# Patient Record
Sex: Female | Born: 1960
Health system: Southern US, Community
[De-identification: ages and names within clinical notes are randomized; demographics above are authoritative.]

## PROBLEM LIST (undated history)

## (undated) DIAGNOSIS — M1990 Unspecified osteoarthritis, unspecified site: Secondary | ICD-10-CM

## (undated) DIAGNOSIS — R011 Cardiac murmur, unspecified: Secondary | ICD-10-CM

## (undated) DIAGNOSIS — I509 Heart failure, unspecified: Secondary | ICD-10-CM

## (undated) DIAGNOSIS — K219 Gastro-esophageal reflux disease without esophagitis: Secondary | ICD-10-CM

## (undated) DIAGNOSIS — I2510 Atherosclerotic heart disease of native coronary artery without angina pectoris: Secondary | ICD-10-CM

## (undated) DIAGNOSIS — I350 Nonrheumatic aortic (valve) stenosis: Secondary | ICD-10-CM

## (undated) DIAGNOSIS — T7840XA Allergy, unspecified, initial encounter: Secondary | ICD-10-CM

## (undated) DIAGNOSIS — J45909 Unspecified asthma, uncomplicated: Secondary | ICD-10-CM

## (undated) DIAGNOSIS — E639 Nutritional deficiency, unspecified: Secondary | ICD-10-CM

## (undated) DIAGNOSIS — E119 Type 2 diabetes mellitus without complications: Secondary | ICD-10-CM

## (undated) DIAGNOSIS — I639 Cerebral infarction, unspecified: Secondary | ICD-10-CM

## (undated) DIAGNOSIS — E785 Hyperlipidemia, unspecified: Secondary | ICD-10-CM

## (undated) DIAGNOSIS — G4730 Sleep apnea, unspecified: Secondary | ICD-10-CM

## (undated) DIAGNOSIS — I739 Peripheral vascular disease, unspecified: Secondary | ICD-10-CM

## (undated) DIAGNOSIS — I10 Essential (primary) hypertension: Secondary | ICD-10-CM

## (undated) DIAGNOSIS — I43 Cardiomyopathy in diseases classified elsewhere: Secondary | ICD-10-CM

## (undated) DIAGNOSIS — I351 Nonrheumatic aortic (valve) insufficiency: Secondary | ICD-10-CM

## (undated) DIAGNOSIS — E889 Metabolic disorder, unspecified: Secondary | ICD-10-CM

## (undated) HISTORY — PX: TUBAL LIGATION: SHX77

## (undated) HISTORY — DX: Cardiomyopathy in diseases classified elsewhere: E63.9

## (undated) HISTORY — PX: CARDIAC CATHETERIZATION: SHX172

## (undated) HISTORY — DX: Nonrheumatic aortic (valve) insufficiency: I35.1

## (undated) HISTORY — DX: Unspecified asthma, uncomplicated: J45.909

## (undated) HISTORY — DX: Type 2 diabetes mellitus without complications: E11.9

## (undated) HISTORY — DX: Cardiomyopathy in diseases classified elsewhere: I43

## (undated) HISTORY — DX: Gastro-esophageal reflux disease without esophagitis: K21.9

## (undated) HISTORY — DX: Peripheral vascular disease, unspecified: I73.9

## (undated) HISTORY — PX: MECHANICAL AORTIC VALVE REPLACEMENT: SHX2013

## (undated) HISTORY — DX: Cardiac murmur, unspecified: R01.1

## (undated) HISTORY — DX: Sleep apnea, unspecified: G47.30

## (undated) HISTORY — PX: PERIPHERAL ARTERIAL STENT GRAFT: SHX2220

## (undated) HISTORY — DX: Cerebral infarction, unspecified: I63.9

## (undated) HISTORY — DX: Essential (primary) hypertension: I10

## (undated) HISTORY — DX: Heart failure, unspecified: I50.9

## (undated) HISTORY — DX: Unspecified osteoarthritis, unspecified site: M19.90

## (undated) HISTORY — DX: Hyperlipidemia, unspecified: E78.5

## (undated) HISTORY — DX: Atherosclerotic heart disease of native coronary artery without angina pectoris: I25.10

## (undated) HISTORY — DX: Nonrheumatic aortic (valve) stenosis: I35.0

## (undated) HISTORY — DX: Allergy, unspecified, initial encounter: T78.40XA

## (undated) HISTORY — DX: Nutritional deficiency, unspecified: E88.9

## (undated) HISTORY — PX: INSERT / REPLACE / REMOVE PACEMAKER: SUR710

---

## 2007-07-03 ENCOUNTER — Inpatient Hospital Stay (HOSPITAL_COMMUNITY): Admission: EM | Admit: 2007-07-03 | Discharge: 2007-07-05 | Payer: Self-pay | Admitting: Emergency Medicine

## 2007-07-03 ENCOUNTER — Ambulatory Visit: Payer: Self-pay | Admitting: Cardiology

## 2007-07-04 ENCOUNTER — Encounter (INDEPENDENT_AMBULATORY_CARE_PROVIDER_SITE_OTHER): Payer: Self-pay | Admitting: Pediatrics

## 2011-03-06 NOTE — H&P (Signed)
Shannon Baxter, Shannon Baxter                 ACCOUNT NO.:  [id number]   MEDICAL RECORD NO.:  [id number]          PATIENT TYPE:  INP   LOCATION:  1824                         FACILITY:  [facility]   PHYSICIAN:  Deanna Artis. Hickling, M.D.DATE OF BIRTH:  1961/06/10   DATE OF ADMISSION:  07/03/2007  DATE OF DISCHARGE:                              HISTORY & PHYSICAL   CHIEF COMPLAINT:  Right-sided weakness and aphasia.   HISTORY OF PRESENT CONDITION:  The patient is a 50 year old African  American woman, obese with hypertension, dyslipidemia and sedentary  lifestyle who smokes rock.  She did so today.  She was with family and  had sudden onset of right-sided weakness and aphasia around 2010.  This  persists until she arrived at the [facility] Emergency Room.  This  rapidly improved between 2130 and 2200 to normal baseline.   The patient had rheumatic fever as a child and has some form of cardiac  valvular disease.  She also has a history of seizures as a child.   PAST SURGICAL HISTORY:  Tubal ligation 20 years ago.  She is a gravida  2, para 2 woman.   CURRENT MEDICATIONS:  1. Crestor 25 mg daily.  2. Gabapentin 100 mg three times daily.  3. Meloxicam 15 mg daily.  4. Avelox 400 mg twice daily.  5. Naproxen 500 mg daily.  6. Guaifenesin 400 mg every 6 hours.   ALLERGIES:  METRONIDAZOLE.   FAMILY HISTORY:  Negative for stroke.  Positive for diabetes mellitus,  hypertension, atherosclerotic cardiovascular disease, obesity, and  dyslipidemia.   SOCIAL HISTORY:  The patient has one living child, the other was  involved in a motor vehicle accident.  She is not married.  She has a  granddaughter who is living with her.  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  She  smokes one or two cigarettes per day.  She drinks occasional beer.  She  smokes rock.   REVIEW OF SYSTEMS:  Positive for bronchitis, arthritis, pain in her  legs, allergies to blue dye, depression, recent urinary tract infection,  status post tubal  ligation without oophorectomy, irregular heart rate. A  12-system review is otherwise negative.   PHYSICAL EXAMINATION:  VITAL SIGNS:  Temperature 97.4, blood pressure  193/93, resting pulse 88, respirations 22, oxygen saturation 99% on 2  liters of oxygen.  HEENT:  No signs of infection.  NECK:  Supple.  No cranial or cervical bruits.  LUNGS:  Clear.  HEART:  No murmurs.  Pulses normal.  ABDOMEN:  Soft, protuberant.  Bowel sounds normal.  No  hepatosplenomegaly.  EXTREMITIES:  Well-formed.  NEUROLOGIC:  The patient was awake, alert without dysphasia, dysarthria,  dyspraxia.  CRANIAL NERVES:  Round reactive pupils.  Normal fundi.  Full visual  fields to double simultaneous stimuli.  Extraocular movements full and  conjugate.  Symmetric facial strength and sensation.  Air conduction  greater than bone conduction bilaterally.  MOTOR EXAMINATION:  Normal strength, fine motor movements.  No drift.  Sensory intact.  CEREBELLAR:  No tumor, dystaxia or dysmetria.  Gait and station normal.  Romberg  negative.  Reflexes symmetric and normal.   NIHSS = 4  (2130) then 0 (2200)   LABORATORY STUDIES:  Sodium 136, potassium 4.1, chloride 107, CO2 24,  BUN 27, creatinine 1.37, glucose 107.  Liver functions studies were  normal.  His white count 11,100, hemoglobin 12.4, hematocrit 38.1,  platelet count 210,000, 64 polys, 25 lymphs, 10 monos, 1 eosinophil, 1  basophil.  Prothrombin time 12.6, INR 0.9, PTT 24.   CT scan of the brain was normal.   IMPRESSION:  Transient ischemic attack left brain. (435.8)  The patient  has multiple risk factors delineated above.   PLAN:  MRI brain, MRA intracranial, 2-D echocardiogram, carotid Doppler,  transcranial Doppler, urine drug screen.  Serum lipid panel, hemoglobin  A1c and serum homocysteine.   The patient will be placed on aspirin.  We will observe her response.  I  appreciate the opportunity to participate in her care. If you have  questions, do  not hesitate to contact me.      Deanna Artis. Sharene Skeans, M.D.  Electronically Signed     [facility]/MEDQ  D:  07/04/2007  T:  07/04/2007  Job:  [id number]

## 2011-03-06 NOTE — Discharge Summary (Signed)
Shannon Baxter, Shannon Baxter                 ACCOUNT NO.:  [id number]   MEDICAL RECORD NO.:  [id number]          PATIENT TYPE:  INP   LOCATION:  3018                         FACILITY:  [facility]   PHYSICIAN:  Genene Churn. Love, M.D.    DATE OF BIRTH:  1961/06/23   DATE OF ADMISSION:  07/03/2007  DATE OF DISCHARGE:  07/05/2007                               DISCHARGE SUMMARY   Patient's address in [address] [place], [place] [address].   This is the first [facility] admission for this 50 year old,  right-handed African-American female with a known history of  hypertension, hyperlipidemia, and sedentary lifestyle.  She is seen for  evaluation of transient aphasia and right-sided weakness.   HISTORY OF PRESENT ILLNESS:  Shannon Baxter has a long history of  hypertension and hyperlipidemia.  She has had a history of rheumatic  fever as a child with aortic valve disease causing stenosis and valvular  insufficiency.  She also has a known history of seizures as a child.  The day of admission, she had the sudden onset of right-sided weakness  and aphasia about 8:10 p.m. which has persistent until her arrival at  [facility], rapidly improved over the next two hours.  She was  admitted by Dr. Sharene Skeans, neurologist, and it was felt she was not a TPA  candidate because of her rapid improvement.  She has no prior history of  stroke.  She does use cocaine, smokes rock on a daily basis.   PAST MEDICAL HISTORY:  Significant for:  1. Aortic stenosis and aortic insufficiency followed at the [facility]      [facility] and also by the [facility] in [place].  2. She has a past history of childhood seizures.  3. She has had a tubal ligation 20 years ago.  4. She has had hypertension.  5. Obesity.  6. Hyperlipidemia.   MEDICAL REVIEW OF SYSTEMS:  Significant for recent bronchitic-type  symptoms.   SHE HAS A HISTORY OF ALLERGIES TO METRONIDAZOLE.   CURRENT  MEDICATIONS AT THE TIME OF ADMISSION:  Included:  1. Crestor 25 mg daily.  2. Gabapentin 100 mg three tabs daily.  3. Meloxicam 15 mg daily.  4. Avelox 400 mg twice daily.  5. Naproxen 500 mg daily.  6. Guaifens 400 mg every six hours.   She smokes one to two cigarettes per day; she occasionally drinks beer.   She has had a history of bronchitis, arthritis, pain in her legs AND  ALLERGIES TO BLUE DYE, depression and recent urinary tract infection.   At the time of admission, her examination by Dr. Sharene Skeans was  unremarkable, but except her blood pressure of 193/93.   LABORATORY DATA:  Revealed a white blood cell count of 11,100,  hemoglobin was 1.24, hematocrit was 38.1, platelet count was 210,000.  Differential was 64% polys, 25% lymphs, 10% monocytes, 1% eosinophils,  1% basophils.  Her INR was 0.9 with a protime of 12.6.  PTT was 24.  Sodium 136, potassium 4.1, chloride 107, CO2 content 24.  Glucose  nonfasting was high at 107.  BUN 27, creatinine 1.37 which was slightly  high.  Her total bilirubin was 0.6, alk phos was 38, SGOT 24, SGPT 24,  total protein 6.9, albumin 3.3, calcium 8.7.  CPK 87, CK-MB 1.7,  troponin 0.06.  Her urinalysis was unremarkable.  Her urine drug screen  was positive for cocaine.  Her lipid profile revealed triglycerides 139,  HDL 47, LDL 125 and VLDL of 28.  Hemoglobin A1c 6.1 which is upper  limits of normal.  Homocysteine 10.9.   Her 2-D echocardiogram showed left ventricle was mildly dilated; overall  left ventricular systolic function was mildly decreased; left  ventricular ejection fraction was estimated in the 35 to 45% range;  there was moderately diffuse left ventricular hypokinesia; left  ventricular wall thickness was moderately increased; Doppler parameters  were consistent with high left ventricular filling pressure.  There was  also thought to me moderate-to-severe aortic valve stenosis with  moderate aortic valve regurgitation.  Mean  transthoracic valve gradient  was 32 mmHg.  Estimated aortic valve area was 0.6 cm2.  Estimated aortic  valve by Vmax was 0.55 cm2.  There was mild mitral valvular  regurgitation; left atrium was mildly dilated.  It was felt that this  was a difficult study.   Doppler study of the carotids revealed a 60 to 80% stenosis of the ICA  bilaterally which is in the upper range.   Her CT scan of the brain revealed negative head CT.   MRI study of the brain with and without contrast enhancement showed  question of an acute infarct in the left putamen suggesting left MCA  lenticular striae territory disease, otherwise the brain was within  normal limits for age.   Her MRA showed no vascular occlusion or significant stenosis; left MCA  vessels appear to be patent.   Telemetry in the hospital showed normal sinus rhythm.   Her 12-lead EKG showed normal sinus rhythm and right atrial enlargement.   IMPRESSION:  1. Left brain stroke; code 434.01.  2. Cocaine use; code 305.61.  3. Hyperlipidemia; code 272.4.  4. History of hypertension, but blood pressures in the hospital all in      the 120/74 range on no medications.  5. Aortic valve disease secondary to rheumatic fever; code 424.01,      with some evidence of aortic insufficiency.  6. Obesity; code 278.01.  7. History of recent urinary tract infection symptoms and bronchitis      treated with Avelox and guaifenesin.  8. Continued cigarette use; code 496.  9. History of seizures as a child; code 245.10.   Plan at this time is discharge patient on :  1. Aspirin 81 mg per day.  2. Crestor 25 mg daily.  3. Gabapentin 100 mg three times daily for leg pain.  4. Meloxicam 15 mg daily.  5. Avelox 400 mg twice daily.  6. Naproxen 500 mg daily.  7. Guaifenesin 400 mg q.6 hours.   She is not to drive a car for three weeks.  She will return to see Dr.  Pearlean Brownie in six weeks.  She is to follow up with her cardiologist regarding  her aortic valve  disease.  She is to continue Crestor for  hyperlipidemia.  She states she was off her counseling for cocaine use  and stated that she had already sought out substance abuse assistance.  She is discharged improved from her prehospital status.  ______________________________  Genene Churn. Sandria Manly, M.D.     JML/MEDQ  D:  07/05/2007  T:  07/05/2007  Job:  [id number]   cc:   Alphonsus Sias  [facility]

## 2011-03-06 NOTE — Discharge Summary (Signed)
NAMEMIDGE, MOMON                 ACCOUNT NO.:  [id number]   MEDICAL RECORD NO.:  [id number]          PATIENT TYPE:  INP   LOCATION:  3018                         FACILITY:  [facility]   PHYSICIAN:  Genene Churn. Love, M.D.    DATE OF BIRTH:  [birth year]   DATE OF ADMISSION:  07/03/2007  DATE OF DISCHARGE:                               DISCHARGE SUMMARY   ADDENDUM:  Miss Hijazi's Avelox will be 400 mg per day instead of 400 mg  twice per day.  She will be asked to stay out of work for 3 weeks in  view of her recent left brain stroke.  She is not to drive a car for 3  weeks.           ______________________________  Genene Churn. Sandria Manly, M.D.     JML/MEDQ  D:  07/05/2007  T:  07/05/2007  Job:  [id number]

## 2011-03-22 ENCOUNTER — Other Ambulatory Visit: Payer: Self-pay | Admitting: Oral Surgery

## 2011-03-22 ENCOUNTER — Encounter (HOSPITAL_BASED_OUTPATIENT_CLINIC_OR_DEPARTMENT_OTHER)
Admission: RE | Admit: 2011-03-22 | Discharge: 2011-03-22 | Disposition: A | Discharge status: Home / Self Care | Payer: Medicaid Other | Source: Ambulatory Visit | Attending: Oral Surgery | Admitting: Oral Surgery

## 2011-03-22 ENCOUNTER — Ambulatory Visit
Admission: RE | Admit: 2011-03-22 | Discharge: 2011-03-22 | Disposition: A | Discharge status: Home / Self Care | Payer: Medicaid Other | Source: Ambulatory Visit | Attending: Oral Surgery | Admitting: Oral Surgery

## 2011-03-22 DIAGNOSIS — Z01811 Encounter for preprocedural respiratory examination: Secondary | ICD-10-CM

## 2011-03-22 LAB — BASIC METABOLIC PANEL
Calcium: 8.9 mg/dL (ref 8.4–10.5)
Creatinine, Ser: 1 mg/dL (ref 0.4–1.2)
GFR calc Af Amer: >60 mL/min (ref >60)

## 2011-03-26 ENCOUNTER — Ambulatory Visit (HOSPITAL_BASED_OUTPATIENT_CLINIC_OR_DEPARTMENT_OTHER)
Admission: RE | Admit: 2011-03-26 | Discharge: 2011-03-26 | Disposition: A | Discharge status: Home / Self Care | Payer: Medicaid Other | Source: Ambulatory Visit | Attending: Oral Surgery | Admitting: Oral Surgery

## 2011-03-26 DIAGNOSIS — K0889 Other specified disorders of teeth and supporting structures: Secondary | ICD-10-CM | POA: Insufficient documentation

## 2011-03-26 DIAGNOSIS — Z8673 Personal history of transient ischemic attack (TIA), and cerebral infarction without residual deficits: Secondary | ICD-10-CM | POA: Insufficient documentation

## 2011-03-26 DIAGNOSIS — Z01812 Encounter for preprocedural laboratory examination: Secondary | ICD-10-CM | POA: Insufficient documentation

## 2011-03-26 DIAGNOSIS — I10 Essential (primary) hypertension: Secondary | ICD-10-CM | POA: Insufficient documentation

## 2011-03-26 DIAGNOSIS — G4733 Obstructive sleep apnea (adult) (pediatric): Secondary | ICD-10-CM | POA: Insufficient documentation

## 2011-03-26 DIAGNOSIS — Z01810 Encounter for preprocedural cardiovascular examination: Secondary | ICD-10-CM | POA: Insufficient documentation

## 2011-03-26 LAB — POCT HEMOGLOBIN-HEMACUE: Hemoglobin: 11.7 g/dL — ABNORMAL LOW (ref 12.0–15.0)

## 2011-03-29 NOTE — Op Note (Signed)
NAMEBAYLI, QUESINBERRY                 ACCOUNT NO.:  [id number]  MEDICAL RECORD NO.:  [id number]  LOCATION:                                 FACILITY:  PHYSICIAN:  Georgia Lopes, M.D.       DATE OF BIRTH:  DATE OF PROCEDURE:  03/26/2011 DATE OF DISCHARGE:                              OPERATIVE REPORT   PREOPERATIVE DIAGNOSES:  Nonrestorable teeth numbers 2, 3, 5, 14, 15, 21, 31.  POSTOPERATIVE DIAGNOSES:  Nonrestorable teeth numbers 2, 3, 5, 14, 15, 21, 31.  PROCEDURE:  Removal of teeth 2, 3, 5, 14, 15, 21, 31.  SURGEON:  Georgia Lopes, MD  ANESTHESIA:  General, Dr. Gelene Mink is attending, nasal intubation.  ASSISTANTS:  Luberta Mutter, DOMA and Marlana Latus, [place]  INDICATIONS FOR PROCEDURE:  Shannon Baxter is a 50 year old black female who was referred to my office by her general dentist for multiple dental extractions.  The patient has significant past medical history of hypertension, status post CVA, history of polysubstance abuse, history of obstructive sleep apnea, valvuloplasty, and has morbid obesity.  She also is odontophobic and required general anesthesia for the procedure. Because of these medical comorbidities, it was recommended that general anesthesia be administered with endotracheal intubation for airway protection.  PROCEDURE:  The patient was taken to the operating room and placed on the table in supine position.  General anesthesia was administered intravenously and a nasal endotracheal tube was placed and secured.  The eyes were protected.  The patient was draped for the procedure.  The posterior pharynx was suctioned and a throat pack was placed.  A 2% lidocaine 1:100,000 epinephrine was infiltrated in an inferior alveolar block on the right and left side and in buccal and palatal infiltration in the maxilla.  A total of 13 mL was utilized.  A bite block was placed on the right side of the mouth and a sweetheart retractor was placed to retract the tongue and the  left side was operated first.  A #15 blade was used to make a full-thickness incision around tooth #21 both buccally and lingually and around teeth numbers 14, 15, buccally and palatally.  The periosteal elevator was used to reflect the periosteum around these teeth and then the dental handpiece was used under irrigation to remove circumferential inner proximal bone.  Then the 301 elevator was used to elevate teeth and the Ash forceps was used in the mandible to remove #21 and the #150 universal forceps was used to remove teeth numbers 14 and 15.  After the teeth were removed, the sockets were curetted, irrigated, and they were closed with 3-0 chromic.  The bite block and sweetheart were repositioned to go inside of the mouth and attention was turned to the right side.  A #15 blade was used to make a full-thickness incision around teeth numbers 2, 3, 5, and 31.  The periosteum was reflected around these teeth and then inner proximal and circumferential bone was removed with a striker handpiece under irrigation.  The teeth were elevated to 301 elevator and then removed from the mouth with the dental forceps.  Teeth numbers 2 and 3 fractured.  A root  tip pick was used to remove residual root tips.  The sockets were irrigated, curetted, and closed with 3-0 chromic.  The patient was then irrigated and the posterior pharynx was suctioned. Throat pack was removed.  The patient was awakened and taken to the recovery room, breathing spontaneously in good condition.  ESTIMATED BLOOD LOSS:  Minimum.  COMPLICATIONS:  None.  SPECIMENS:  None.  The patient was scheduled for followup in 2 weeks, given a prescription for Percocet 5/325, #40, given routine postoperative instructions including ice, soft diet, warm saline rinses. She was scheduled to be discharged if she met the discharge criteria.  If not she is to be kept overnight for observation.     Georgia Lopes, M.D.     SMJ/MEDQ   D:  03/26/2011  T:  03/26/2011  Job:  [id number]  Electronically Signed by Ocie Doyne M.D. on 03/29/2011 11:51:09 AM

## 2011-08-03 LAB — COMPREHENSIVE METABOLIC PANEL
AST: 24
AST: 24
Albumin: 3.5
CO2: 24
Calcium: 8.6
Calcium: 8.7
Creatinine, Ser: 1.18
Creatinine, Ser: 1.37 — ABNORMAL HIGH
GFR calc Af Amer: 52 — ABNORMAL LOW
GFR calc Af Amer: 60 — ABNORMAL LOW
GFR calc non Af Amer: 43 — ABNORMAL LOW

## 2011-08-03 LAB — DIFFERENTIAL
Eosinophils Relative: 1
Lymphocytes Relative: 25
Lymphs Abs: 2.7
Neutrophils Relative %: 64

## 2011-08-03 LAB — PROTIME-INR
INR: 0.9
INR: 0.9
Prothrombin Time: 12.6

## 2011-08-03 LAB — URINALYSIS, ROUTINE W REFLEX MICROSCOPIC
Bilirubin Urine: NEGATIVE
Nitrite: NEGATIVE
Protein, ur: NEGATIVE
Specific Gravity, Urine: 1.016
Urobilinogen, UA: 0.2

## 2011-08-03 LAB — RAPID URINE DRUG SCREEN, HOSP PERFORMED
Barbiturates: NOT DETECTED
Opiates: NOT DETECTED
Tetrahydrocannabinol: NOT DETECTED

## 2011-08-03 LAB — LIPID PANEL
LDL Cholesterol: 125 — ABNORMAL HIGH
Triglycerides: 139

## 2011-08-03 LAB — APTT
aPTT: 24
aPTT: 25

## 2011-08-03 LAB — CK TOTAL AND CKMB (NOT AT ARMC)
CK, MB: 1.7
CK, MB: 1.9
Relative Index: INVALID
Relative Index: INVALID
Total CK: 80

## 2011-08-03 LAB — HOMOCYSTEINE: Homocysteine: 10.9

## 2011-08-03 LAB — TROPONIN I
Troponin I: 0.06
Troponin I: 0.06

## 2011-08-03 LAB — CBC
MCV: 82.5
Platelets: 210
Platelets: 213
WBC: 11.1 — ABNORMAL HIGH
WBC: 11.5 — ABNORMAL HIGH

## 2011-08-03 LAB — HEMOGLOBIN A1C: Mean Plasma Glucose: 140

## 2011-08-24 IMAGING — CR DG CHEST 2V
2 series · 2 of 2 positions shown · non-contrast
Comparison: None.

CLINICAL DATA: Preop for dental surgery

CHEST - 2 VIEW

[w chest pa]
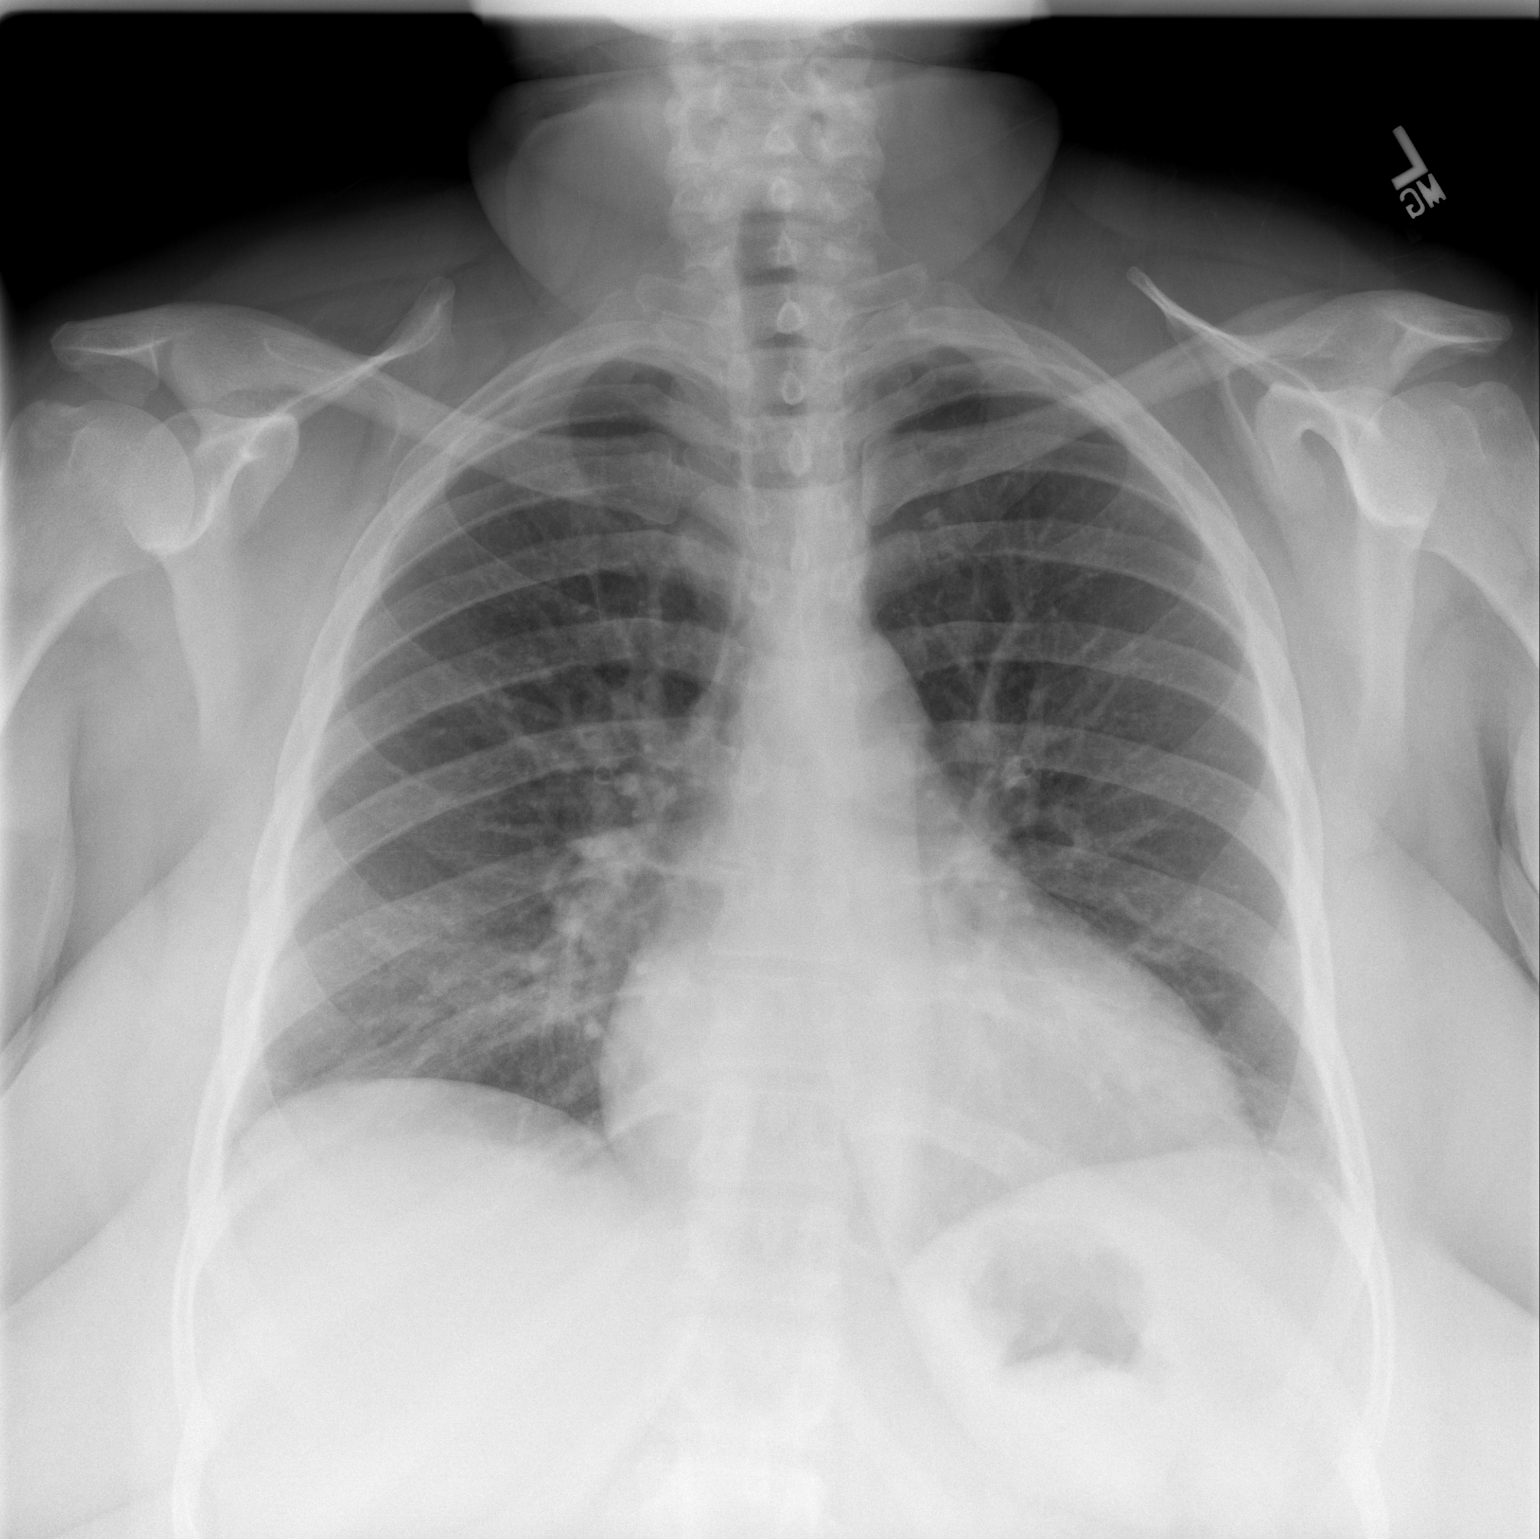

[w chest lat]
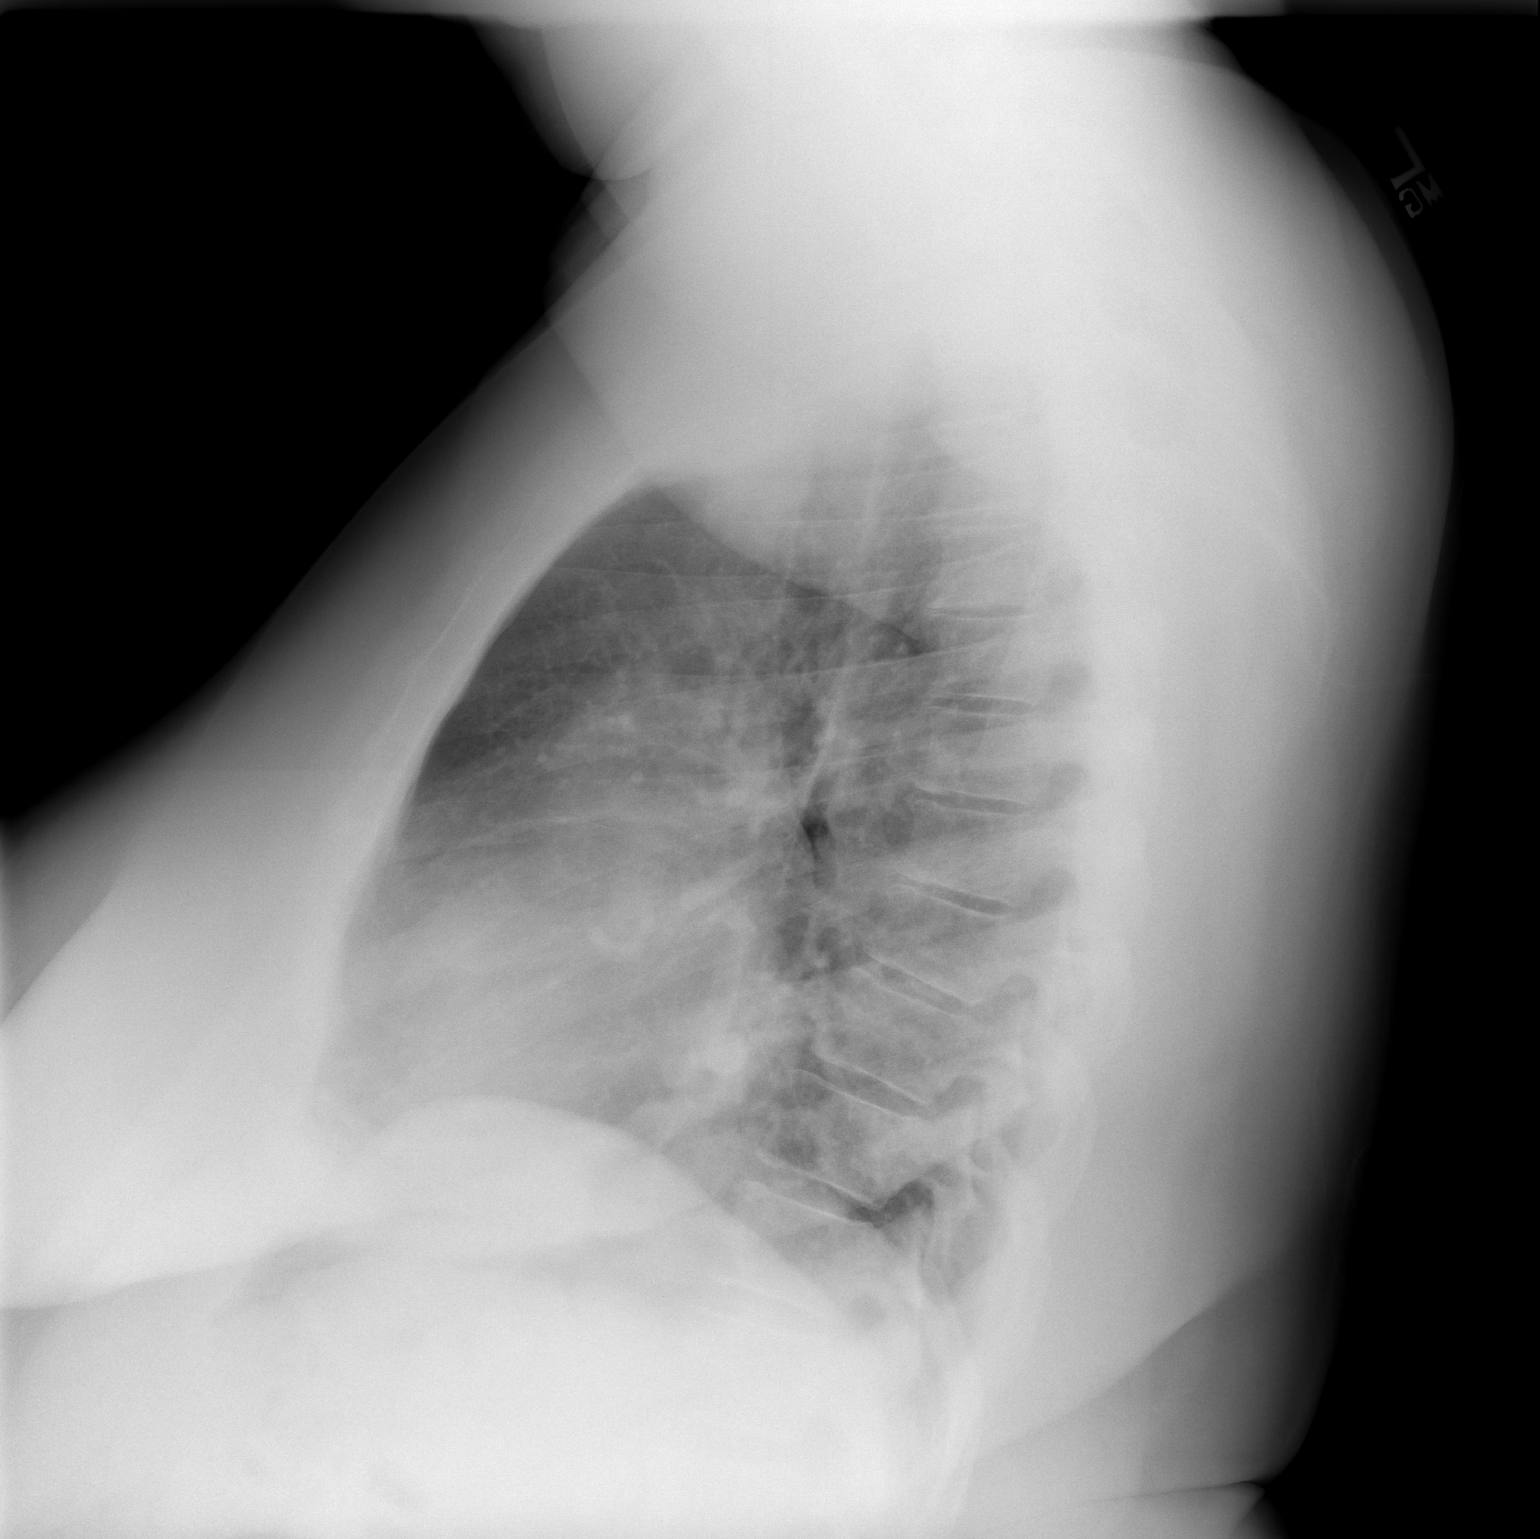

[2 of 2 positions shown; findings below may reference images not displayed]

FINDINGS: The lungs are clear.  Mediastinal contours are normal.
The heart is within upper limits of normal.  No bony abnormality is
seen.
IMPRESSION: Borderline cardiomegaly.  No active lung disease.

## 2012-10-08 LAB — HM DIABETES EYE EXAM

## 2013-10-07 ENCOUNTER — Ambulatory Visit: Payer: Medicaid Other

## 2013-10-07 ENCOUNTER — Ambulatory Visit (INDEPENDENT_AMBULATORY_CARE_PROVIDER_SITE_OTHER): Payer: Medicaid Other

## 2013-10-07 VITALS — BP 148/82 | HR 64 | Resp 16

## 2013-10-07 DIAGNOSIS — I739 Peripheral vascular disease, unspecified: Secondary | ICD-10-CM

## 2013-10-07 DIAGNOSIS — E1142 Type 2 diabetes mellitus with diabetic polyneuropathy: Secondary | ICD-10-CM

## 2013-10-07 DIAGNOSIS — E1140 Type 2 diabetes mellitus with diabetic neuropathy, unspecified: Secondary | ICD-10-CM

## 2013-10-07 DIAGNOSIS — M79609 Pain in unspecified limb: Secondary | ICD-10-CM

## 2013-10-07 DIAGNOSIS — E1149 Type 2 diabetes mellitus with other diabetic neurological complication: Secondary | ICD-10-CM

## 2013-10-07 MED ORDER — MELOXICAM 15 MG PO TABS: 15.0000 mg | ORAL_TABLET | Freq: Every day | ORAL | Status: DC

## 2013-10-07 NOTE — Patient Instructions (Signed)
Diabetic Neuropathy Diabetic neuropathy is a nerve disease or nerve damage that is caused by diabetes mellitus. About half of all people with diabetes mellitus have some form of nerve damage. Nerve damage is more common in those who have had diabetes mellitus for many years and who generally have not had good control of their blood sugar (glucose) level. Diabetic neuropathy is a common complication of diabetes mellitus. There are three more common types of diabetic neuropathy and a fourth type that is less common and less understood:   Peripheral neuropathy This is the most common type of diabetic neuropathy. It causes damage to the nerves of the feet and legs first and then eventually the hands and arms.The damage affects the ability to sense touch.  Autonomic neuropathy This type causes damage to the autonomic nervous system, which controls the following functions:  Heartbeat.  Body temperature.  Blood pressure.  Urination.  Digestion.  Sweating.  Sexual function.  Focal neuropathy Focal neuropathy can be painful and unpredictable and occurs most often in older adults with diabetes mellitus. It involves a specific nerve or one area and often comes on suddenly. It usually does not cause long-term problems.  Radiculoplexus neuropathy Sometimes called lumbosacral radiculoplexus neuropathy, radiculoplexus neuropathy affects the nerves of the thighs, hips, buttocks, or legs. It is more common in people with type 2 diabetes mellitus and in older men. It is characterized by debilitating pain, weakness, and atrophy, usually in the thigh muscles. CAUSES  The cause of peripheral, autonomic, and focal neuropathies is diabetes mellitus that is uncontrolled and high glucose levels. The cause of radiculoplexus neuropathy is unknown. However, it is thought to be caused by inflammation related to uncontrolled glucose levels. SIGNS AND SYMPTOMS  Peripheral Neuropathy Peripheral neuropathy develops  slowly over time. When the nerves of the feet and legs no longer work there may be:   Burning, stabbing, or aching pain in the legs or feet.  Inability to feel pressure or pain in your feet. This can lead to:  Thick calluses over pressure areas.  Pressure sores.  Ulcers.  Foot deformities.  Reduced ability to feel temperature changes.  Muscle weakness. Autonomic Neuropathy The symptoms of autonomic neuropathy vary depending on which nerves are affected. Symptoms may include:  Problems with digestion, such as:  Feeling sick to your stomach (nausea).  Vomiting.  Bloating.  Constipation.  Diarrhea.  Abdominal pain.  Difficulty with urination. This occurs if you lose your ability to sense when your bladder is full. Problems include:  Urine leakage (incontinence).  Inability to empty your bladder completely (retention).  Rapid or irregular heartbeat (palpitations).  Blood pressure drops when you stand up (orthostatic hypotension). When you stand up you may feel:  Dizzy.  Weak.  Faint.  In men, inability to attain and maintain an erection.  In women, vaginal dryness and problems with decreased sexual desire and arousal.  Problems with body temperature regulation.  Increased or decreased sweating. Focal Neuropathy  Abnormal eye movements or abnormal alignment of both eyes.  Weakness in the wrist.  Foot drop. This results in an inability to lift the foot properly and abnormal walking or foot movement.  Paralysis on one side of your face (Bell palsy).  Chest or abdominal pain. Radiculoplexus Neuropathy  Sudden, severe pain in your hip, thigh, or buttocks.  Weakness and wasting of thigh muscles.  Difficulty rising from a seated position.  Abdominal swelling.  Unexplained weight loss (usually more than 10 lb [4.5 kg]). DIAGNOSIS  Peripheral Neuropathy   Your senses may be tested. Sensory function testing can be done with:  A light touch using a  monofilament.  A vibration with tuning fork.  A sharp sensation with a pin prick Other tests that can help diagnose neuropathy are:  Nerve conduction velocity. This test checks the transmission of an electrical current through a nerve.  Electromyography. This shows how muscles respond to electrical signals transmitted by nearby nerves.  Quantitative sensory testing. This is used to assess how your nerves respond to vibrations and changes in temperature. Autonomic Neuropathy Diagnosis is often based on reported symptoms. Tell your health care provider if you experience:   Dizziness.   Constipation.   Diarrhea.   Inappropriate urination or inability to urinate.   Inability to get or maintain an erection.  Tests that may be done include:   Electrocardiography or Holter monitor. These are tests that can help show problems with the heart rate or heart rhythm.   An X-ray exam may be done. Focal Neuropathy Diagnosis is made based on your symptoms and what your health care provider finds during your exam. Other tests may be done. They may include:  Nerve conduction velocities. This checks the transmission of electrical current through a nerve.  Electromyography. This shows how muscles respond to electrical signals transmitted by nearby nerves.  Quantitative sensory testing. This test is used to assess how your nerves respond to vibration and changes in temperature. Radiculoplexus Neuropathy  Often the first thing is to eliminate any other issue or problems that might be the cause, as there is no stick test for diagnosis.  X-ray exam of your spine and lumbar region.  Spinal tap to rule out cancer.  MRI to rule out other lesions. TREATMENT  Once nerve damage occurs it cannot be reversed. The goal of treatment is to keep the disease or nerve damage from getting worse and affecting more nerve fibers. Controlling your blood glucose level is the key. Most people with  radiculoplexus neuropathy see at least a partial improvement over time. You will need to keep your blood glucose and HbA1c levels in the target range determined by your health care provider. Things that help control blood glucose levels include:   Blood glucose monitoring.   Meal planning.   Physical activity.   Diabetes medicine.  Over time, maintaining lower blood glucose levels helps lessen symptoms. Sometimes, prescription pain medicine is needed. HOME CARE INSTRUCTIONS:  Do not smoke.  Keep your blood glucose level in the range that you and your health care provider have determined acceptable for you.  Keep your blood pressure level in the range that you and your health care provider have determined acceptable for you.  Eat a well-balanced diet.  Be active every day.  Check your feet every day. SEEK MEDICAL CARE IF:   You have burning, stabbing, or aching pain in the legs or feet.  You are unable to feel pressure or pain in your feet.  You develop problems with digestion such as:  Nausea.  Vomiting.  Bloating.  Constipation.  Diarrhea.  Abdominal pain.  You have difficulty with urination, such as:  Incontinence.  Retention.  You have palpitations.  You develop orthostatic hypotension. When you stand up you may feel:  Dizzy.  Weak.  Faint.  You cannot attain and maintain an erection (in men).  You have vaginal dryness and problems with decreased sexual desire and arousal (in women).  You have severe pain in your thighs, legs, or buttocks.  You have  unexplained weight loss. Document Released: 12/17/2001 Document Revised: 06/10/2013 Document Reviewed: 03/19/2013 [facility] Patient Information 2014 [employer], [place]. Diabetes and Foot Care Diabetes may cause you to have problems because of poor blood supply (circulation) to your feet and legs. This may cause the skin on your feet to become thinner, break easier, and heal more slowly. Your skin may  become dry, and the skin may peel and crack. You may also have nerve damage in your legs and feet causing decreased feeling in them. You may not notice minor injuries to your feet that could lead to infections or more serious problems. Taking care of your feet is one of the most important things you can do for yourself.  HOME CARE INSTRUCTIONS  Wear shoes at all times, even in the house. Do not go barefoot. Bare feet are easily injured.  Check your feet daily for blisters, cuts, and redness. If you cannot see the bottom of your feet, use a mirror or ask someone for help.  Wash your feet with warm water (do not use hot water) and mild soap. Then pat your feet and the areas between your toes until they are completely dry. Do not soak your feet as this can dry your skin.  Apply a moisturizing lotion or petroleum jelly (that does not contain alcohol and is unscented) to the skin on your feet and to dry, brittle toenails. Do not apply lotion between your toes.  Trim your toenails straight across. Do not dig under them or around the cuticle. File the edges of your nails with an emery board or nail file.  Do not cut corns or calluses or try to remove them with medicine.  Wear clean socks or stockings every day. Make sure they are not too tight. Do not wear knee-high stockings since they may decrease blood flow to your legs.  Wear shoes that fit properly and have enough cushioning. To break in new shoes, wear them for just a few hours a day. This prevents you from injuring your feet. Always look in your shoes before you put them on to be sure there are no objects inside.  Do not cross your legs. This may decrease the blood flow to your feet.  If you find a minor scrape, cut, or break in the skin on your feet, keep it and the skin around it clean and dry. These areas may be cleansed with mild soap and water. Do not cleanse the area with peroxide, alcohol, or iodine.  When you remove an adhesive  bandage, be sure not to damage the skin around it.  If you have a wound, look at it several times a day to make sure it is healing.  Do not use heating pads or hot water bottles. They may burn your skin. If you have lost feeling in your feet or legs, you may not know it is happening until it is too late.  Make sure your health care provider performs a complete foot exam at least annually or more often if you have foot problems. Report any cuts, sores, or bruises to your health care provider immediately. SEEK MEDICAL CARE IF:   You have an injury that is not healing.  You have cuts or breaks in the skin.  You have an ingrown nail.  You notice redness on your legs or feet.  You feel burning or tingling in your legs or feet.  You have pain or cramps in your legs and feet.  Your legs or feet  are numb.  Your feet always feel cold. SEEK IMMEDIATE MEDICAL CARE IF:   There is increasing redness, swelling, or pain in or around a wound.  There is a red line that goes up your leg.  Pus is coming from a wound.  You develop a fever or as directed by your health care provider.  You notice a bad smell coming from an ulcer or wound. Document Released: 10/05/2000 Document Revised: 06/10/2013 Document Reviewed: 03/17/2013 [facility] Patient Information 2014 [place].

## 2013-10-07 NOTE — Progress Notes (Signed)
   Subjective:    Patient ID: Shannon Baxter, female    DOB: [birth year], 52 y.o.   MRN: [id number]  HPI my feet hurt in the arch and the ball of both feet and been going on for about 5 months and burns, throbs,numbness, tingling and no swelling    Review of Systems  Constitutional: Negative.   HENT: Negative.   Eyes: Negative.   Respiratory: Negative.   Cardiovascular: Negative.   Gastrointestinal: Negative.   Endocrine: Positive for cold intolerance and heat intolerance.  Genitourinary: Negative.   Musculoskeletal: Negative.   Skin: Negative.   Allergic/Immunologic: Negative.   Neurological: Negative.   Hematological: Bruises/bleeds easily.  Psychiatric/Behavioral: Negative.        Objective:   Physical Exam Patient presents this time with a complaint of pain burning and shooting stinging pain in the ball of both feet up and her toes. The been going on for several months. Patient cases only recently diagnosed with diabetes however no structural soft as indicated a diabetes type 2 uncontrolled./Only recently started on metformin. Neurovascular status appears to be diminished I can barely feel a palpable dorsalis pedis on the right and left bilateral plus one over 4 bilateral PT pulse nonpalpable bilateral. Temperature warm turgor somewhat diminished there is no edema no erythema noted her patient had vascular disease requiring cardiac stenting in the past. Is also likely should watch his lower extremities. Indicates rest pain when she's sleeping has cramping in her feet and legs can only walk a short distance less than a block without having to stop and taken break findings consistent with claudication. Orthopedic biomechanical exam unremarkable rectus foot mild flexible digital contractures noted no x-rays taken at this time is pain on direct lateral compression the forefoot second third interspaces cannot rule out possible early neuroma versus diabetic peripheral neuropathy.         Assessment & Plan:  Assessment this time diabetes with peripheral neuropathy uncontrolled diabetes noted. Patient also may have some claudication symptoms recommendation at this time for referral for vascular evaluation lower extremity arterial Doppler workup at [facility]. Followup in 2-3 weeks for reevaluation and be candidate for other noninvasive studies possibly an x-rays based on findings of the vascular study. As alternative to the ibuprofen patient indicates may not been helping will do a trial of Mobic 15 minutes once daily we'll stop the ibuprofen. Recheck in 2-3 weeks for followup maintain a good a coming shoe that she is currently wearing. Avoid any ballistic activities.  Alvan Dame DPM

## 2013-11-05 ENCOUNTER — Ambulatory Visit: Payer: Medicaid Other

## 2013-11-12 ENCOUNTER — Ambulatory Visit (INDEPENDENT_AMBULATORY_CARE_PROVIDER_SITE_OTHER): Payer: Medicaid Other

## 2013-11-12 VITALS — BP 139/76 | HR 68 | Resp 18

## 2013-11-12 DIAGNOSIS — E1165 Type 2 diabetes mellitus with hyperglycemia: Principal | ICD-10-CM

## 2013-11-12 DIAGNOSIS — E1140 Type 2 diabetes mellitus with diabetic neuropathy, unspecified: Secondary | ICD-10-CM

## 2013-11-12 DIAGNOSIS — M79609 Pain in unspecified limb: Secondary | ICD-10-CM

## 2013-11-12 DIAGNOSIS — I739 Peripheral vascular disease, unspecified: Secondary | ICD-10-CM

## 2013-11-12 DIAGNOSIS — IMO0002 Reserved for concepts with insufficient information to code with codable children: Secondary | ICD-10-CM

## 2013-11-12 DIAGNOSIS — E1149 Type 2 diabetes mellitus with other diabetic neurological complication: Secondary | ICD-10-CM

## 2013-11-12 DIAGNOSIS — E1142 Type 2 diabetes mellitus with diabetic polyneuropathy: Secondary | ICD-10-CM

## 2013-11-12 NOTE — Progress Notes (Signed)
   Subjective:    Patient ID: Shannon Baxter, female    DOB: January 07, 1961, 53 y.o.   MRN: [id number]  HPI I am here to get the results of the vascular study and I was in the hospital 10/18/13 for a blood clot behind the lungs and left on 10/21/13 Patient developed a pulmonary embolus was treated with Lovenox for several days and then discharged from the hospital remains on aspirin therapy at this time.   Review of Systems no other new changes or problems other than hospitalization or a mentioned. Continues to have pain with walking claudication symptoms     Objective:   Physical Exam Vascular status as follows weakly palpable dorsalis pedis on the right PT pulses nonpalpable bilateral DP pulse nonpalpable on left. Patient continues to have weakness in gait difficulty with long-term or standing walking or activity temperature warm to cool. There no open wounds or ulcerations currently patient does have dystrophic nails will maintain palliative care in the future and as-needed basis suggest every 2-3 months for repeat palliative care. Maintain appropriate coming shoes wearing new balance currently patient will continue with her current medicines has previously been seen a [facility] at this time we'll refer back to [facility] for the cardiovascular surgeons assessment. We'll for results of her recent vascular lab studies from [facility] as well      Assessment & Plan:  Assessment diabetes with complications including neuropathy paresthesia burning the feet as well as claudication and angiopathy associated with peripheral vascular compromise patient does have fine since consistent with occlusive disease or stenosis bilateral SFA significant abnormal ABIs especially with exercise-induced stress. Patient is moderate lower extremity occlusive disease followup with cardiovascular evaluation as recommended. Reappointed for nail debridement in 2 months for palliative care next  Alvan Dame  DPM

## 2013-11-12 NOTE — Patient Instructions (Signed)
Peripheral Vascular Disease Peripheral Vascular Disease (PVD), also called Peripheral Arterial Disease (PAD), is a circulation problem caused by cholesterol (atherosclerotic plaque) deposits in the arteries. PVD commonly occurs in the lower extremities (legs) but it can occur in other areas of the body, such as your arms. The cholesterol buildup in the arteries reduces blood flow which can cause pain and other serious problems. The presence of PVD can place a person at risk for Coronary Artery Disease (CAD).  CAUSES  Causes of PVD can be many. It is usually associated with more than one risk factor such as:   High Cholesterol.  Smoking.  Diabetes.  Lack of exercise or inactivity.  High blood pressure (hypertension).  Obesity.  Family history. SYMPTOMS   When the lower extremities are affected, patients with PVD may experience:  Leg pain with exertion or physical activity. This is called INTERMITTENT CLAUDICATION. This may present as cramping or numbness with physical activity. The location of the pain is associated with the level of blockage. For example, blockage at the abdominal level (distal abdominal aorta) may result in buttock or hip pain. Lower leg arterial blockage may result in calf pain.  As PVD becomes more severe, pain can develop with less physical activity.  In people with severe PVD, leg pain may occur at rest.  Other PVD signs and symptoms:  Leg numbness or weakness.  Coldness in the affected leg or foot, especially when compared to the other leg.  A change in leg color.  Patients with significant PVD are more prone to ulcers or sores on toes, feet or legs. These may take longer to heal or may reoccur. The ulcers or sores can become infected.  If signs and symptoms of PVD are ignored, gangrene may occur. This can result in the loss of toes or loss of an entire limb.  Not all leg pain is related to PVD. Other medical conditions can cause leg pain such  as:  Blood clots (embolism) or Deep Vein Thrombosis.  Inflammation of the blood vessels (vasculitis).  Spinal stenosis. DIAGNOSIS  Diagnosis of PVD can involve several different types of tests. These can include:  Pulse Volume Recording Method (PVR). This test is simple, painless and does not involve the use of X-rays. PVR involves measuring and comparing the blood pressure in the arms and legs. An ABI (Ankle-Brachial Index) is calculated. The normal ratio of blood pressures is 1. As this number becomes smaller, it indicates more severe disease.  < 0.95  indicates significant narrowing in one or more leg vessels.  <0.8 there will usually be pain in the foot, leg or buttock with exercise.  <0.4 will usually have pain in the legs at rest.  <0.25  usually indicates limb threatening PVD.  Doppler detection of pulses in the legs. This test is painless and checks to see if you have a pulses in your legs/feet.  A dye or contrast material (a substance that highlights the blood vessels so they show up on x-ray) may be given to help your caregiver better see the arteries for the following tests. The dye is eliminated from your body by the kidney's. Your caregiver may order blood work to check your kidney function and other laboratory values before the following tests are performed:  Magnetic Resonance Angiography (MRA). An MRA is a picture study of the blood vessels and arteries. The MRA machine uses a large magnet to produce images of the blood vessels.  Computed Tomography Angiography (CTA). A CTA is a   specialized x-ray that looks at how the blood flows in your blood vessels. An IV may be inserted into your arm so contrast dye can be injected.  Angiogram. Is a procedure that uses x-rays to look at your blood vessels. This procedure is minimally invasive, meaning a small incision (cut) is made in your groin. A small tube (catheter) is then inserted into the artery of your groin. The catheter is  guided to the blood vessel or artery your caregiver wants to examine. Contrast dye is injected into the catheter. X-rays are then taken of the blood vessel or artery. After the images are obtained, the catheter is taken out. TREATMENT  Treatment of PVD involves many interventions which may include:  Lifestyle changes:  Quitting smoking.  Exercise.  Following a low fat, low cholesterol diet.  Control of diabetes.  Foot care is very important to the PVD patient. Good foot care can help prevent infection.  Medication:  Cholesterol-lowering medicine.  Blood pressure medicine.  Anti-platelet drugs.  Certain medicines may reduce symptoms of Intermittent Claudication.  Interventional/Surgical options:  Angioplasty. An Angioplasty is a procedure that inflates a balloon in the blocked artery. This opens the blocked artery to improve blood flow.  Stent Implant. A wire mesh tube (stent) is placed in the artery. The stent expands and stays in place, allowing the artery to remain open.  Peripheral Bypass Surgery. This is a surgical procedure that reroutes the blood around a blocked artery to help improve blood flow. This type of procedure may be performed if Angioplasty or stent implants are not an option. SEEK IMMEDIATE MEDICAL CARE IF:   You develop pain or numbness in your arms or legs.  Your arm or leg turns cold, becomes blue in color.  You develop redness, warmth, swelling and pain in your arms or legs. MAKE SURE YOU:   Understand these instructions.  Will watch your condition.  Will get help right away if you are not doing well or get worse. Document Released: 11/15/2004 Document Revised: 12/31/2011 Document Reviewed: 10/12/2008 [facility] Patient Information 2014 [place], [place].  Followup with her appointment with [facility]. Strong likelihood may need interventional vascular procedure such as a stent or  bypass for lower legs. Followup with regular podiatry care in the next 2-3 months for nail care.    Diabetes and Foot Care Diabetes may cause you to have problems because of poor blood supply (circulation) to your feet and legs. This may cause the skin on your feet to become thinner, break easier, and heal more slowly. Your skin may become dry, and the skin may peel and crack. You may also have nerve damage in your legs and feet causing decreased feeling in them. You may not notice minor injuries to your feet that could lead to infections or more serious problems. Taking care of your feet is one of the most important things you can do for yourself.  HOME CARE INSTRUCTIONS  Wear shoes at all times, even in the house. Do not go barefoot. Bare feet are easily injured.  Check your feet daily for blisters, cuts, and redness. If you cannot see the bottom of your feet, use a mirror or ask someone for help.  Wash your feet with warm water (do not use hot water) and mild soap. Then pat your feet and the areas between your toes until they are completely dry. Do not soak your feet as this can dry your skin.  Apply a  moisturizing lotion or petroleum jelly (that does not contain alcohol and is unscented) to the skin on your feet and to dry, brittle toenails. Do not apply lotion between your toes.  Trim your toenails straight across. Do not dig under them or around the cuticle. File the edges of your nails with an emery board or nail file.  Do not cut corns or calluses or try to remove them with medicine.  Wear clean socks or stockings every day. Make sure they are not too tight. Do not wear knee-high stockings since they may decrease blood flow to your legs.  Wear shoes that fit properly and have enough cushioning. To break in new shoes, wear them for just a few hours a day. This prevents you from injuring your feet. Always look in your shoes before you put them on to be sure there are no objects  inside.  Do not cross your legs. This may decrease the blood flow to your feet.  If you find a minor scrape, cut, or break in the skin on your feet, keep it and the skin around it clean and dry. These areas may be cleansed with mild soap and water. Do not cleanse the area with peroxide, alcohol, or iodine.  When you remove an adhesive bandage, be sure not to damage the skin around it.  If you have a wound, look at it several times a day to make sure it is healing.  Do not use heating pads or hot water bottles. They may burn your skin. If you have lost feeling in your feet or legs, you may not know it is happening until it is too late.  Make sure your health care provider performs a complete foot exam at least annually or more often if you have foot problems. Report any cuts, sores, or bruises to your health care provider immediately. SEEK MEDICAL CARE IF:   You have an injury that is not healing.  You have cuts or breaks in the skin.  You have an ingrown nail.  You notice redness on your legs or feet.  You feel burning or tingling in your legs or feet.  You have pain or cramps in your legs and feet.  Your legs or feet are numb.  Your feet always feel cold. SEEK IMMEDIATE MEDICAL CARE IF:   There is increasing redness, swelling, or pain in or around a wound.  There is a red line that goes up your leg.  Pus is coming from a wound.  You develop a fever or as directed by your health care provider.  You notice a bad smell coming from an ulcer or wound. Document Released: 10/05/2000 Document Revised: 06/10/2013 Document Reviewed: 03/17/2013 [facility] Patient Information 2014 [place].

## 2014-01-14 ENCOUNTER — Ambulatory Visit: Payer: Medicaid Other

## 2014-12-01 LAB — HM DEXA SCAN: HM Dexa Scan: NORMAL

## 2015-07-26 DIAGNOSIS — I739 Peripheral vascular disease, unspecified: Secondary | ICD-10-CM

## 2015-07-26 DIAGNOSIS — I10 Essential (primary) hypertension: Secondary | ICD-10-CM | POA: Insufficient documentation

## 2015-07-26 DIAGNOSIS — E785 Hyperlipidemia, unspecified: Secondary | ICD-10-CM | POA: Insufficient documentation

## 2015-07-26 DIAGNOSIS — I2510 Atherosclerotic heart disease of native coronary artery without angina pectoris: Secondary | ICD-10-CM

## 2015-07-26 DIAGNOSIS — Z87891 Personal history of nicotine dependence: Secondary | ICD-10-CM

## 2015-07-26 DIAGNOSIS — I6529 Occlusion and stenosis of unspecified carotid artery: Secondary | ICD-10-CM | POA: Insufficient documentation

## 2015-07-26 HISTORY — DX: Peripheral vascular disease, unspecified: I73.9

## 2015-07-26 HISTORY — DX: Occlusion and stenosis of unspecified carotid artery: I65.29

## 2015-07-26 HISTORY — DX: Hyperlipidemia, unspecified: E78.5

## 2015-07-26 HISTORY — DX: Personal history of nicotine dependence: Z87.891

## 2015-07-26 HISTORY — DX: Atherosclerotic heart disease of native coronary artery without angina pectoris: I25.10

## 2015-07-26 HISTORY — DX: Essential (primary) hypertension: I10

## 2015-08-04 DIAGNOSIS — I70213 Atherosclerosis of native arteries of extremities with intermittent claudication, bilateral legs: Secondary | ICD-10-CM

## 2015-08-04 HISTORY — DX: Atherosclerosis of native arteries of extremities with intermittent claudication, bilateral legs: I70.213

## 2015-08-18 ENCOUNTER — Ambulatory Visit (INDEPENDENT_AMBULATORY_CARE_PROVIDER_SITE_OTHER): Payer: Medicaid Other | Admitting: Sports Medicine

## 2015-08-18 ENCOUNTER — Encounter: Payer: Self-pay | Admitting: Sports Medicine

## 2015-08-18 DIAGNOSIS — M79605 Pain in left leg: Secondary | ICD-10-CM

## 2015-08-18 DIAGNOSIS — E1140 Type 2 diabetes mellitus with diabetic neuropathy, unspecified: Secondary | ICD-10-CM

## 2015-08-18 DIAGNOSIS — E1165 Type 2 diabetes mellitus with hyperglycemia: Secondary | ICD-10-CM

## 2015-08-18 DIAGNOSIS — M79604 Pain in right leg: Secondary | ICD-10-CM

## 2015-08-18 DIAGNOSIS — I739 Peripheral vascular disease, unspecified: Secondary | ICD-10-CM

## 2015-08-18 DIAGNOSIS — IMO0002 Reserved for concepts with insufficient information to code with codable children: Secondary | ICD-10-CM

## 2015-08-18 NOTE — Progress Notes (Signed)
Patient ID: Shannon Baxter, female   DOB: [birth year], 54 y.o.   MRN: [id number] Subjective: Shannon Baxter is a 54 y.o. female patient with history of type 2 diabetes and PVD with claudication who presents to office today complaining pain in both feet. Patient states that pain is better since her angioplasty of which she had in August of 2015. Patient states that when she finished her last appointment with the vascular doctor he recommended that she follow up with her podiatrist for evaluation. Patient states that the glucose reading this morning was 99 mg/dl. Patient denies any new changes in medication or new problems. Patient denies any new cramping, numbness, burning or tingling in the legs. Admits to occasional pain and numbness while sleeping at night that is relieved by hanging feet off bed. Denies any other pedal complaints.   There are no active problems to display for this patient.  Current Outpatient Prescriptions on File Prior to Visit  Medication Sig Dispense Refill  . ACCU-CHEK SOFTCLIX LANCETS lancets     . amoxicillin (AMOXIL) 500 MG capsule Take 500 mg by mouth 3 (three) times daily.    Marland Kitchen aspirin 81 MG tablet Take 81 mg by mouth daily.    Marland Kitchen atorvastatin (LIPITOR) 80 MG tablet Take 80 mg by mouth daily.    . fluticasone (FLONASE) 50 MCG/ACT nasal spray     . gabapentin (NEURONTIN) 100 MG capsule Take 100 mg by mouth 3 (three) times daily.    . hydrochlorothiazide (HYDRODIURIL) 25 MG tablet Take 25 mg by mouth daily.    Marland Kitchen ipratropium (ATROVENT HFA) 17 MCG/ACT inhaler Inhale 2 puffs into the lungs every 6 (six) hours.    Marland Kitchen ipratropium-albuterol (DUONEB) 0.5-2.5 (3) MG/3ML SOLN     . meloxicam (MOBIC) 15 MG tablet Take 1 tablet (15 mg total) by mouth daily. 30 tablet 2  . metFORMIN (GLUCOPHAGE) 500 MG tablet Take by mouth 2 (two) times daily with a meal.    . methylPREDNISolone (MEDROL) 4 MG tablet Take 4 mg by mouth daily.    . metoprolol tartrate (LOPRESSOR) 25 MG tablet Take 25 mg by mouth  2 (two) times daily.    . nitroGLYCERIN (NITROSTAT) 0.4 MG SL tablet Place 0.4 mg under the tongue every 5 (five) minutes as needed for chest pain.    Marland Kitchen nystatin (MYCOSTATIN) 100000 UNIT/ML suspension     . omeprazole (PRILOSEC) 20 MG capsule     . SPIRIVA HANDIHALER 18 MCG inhalation capsule      No current facility-administered medications on file prior to visit.   Allergies  Allergen Reactions  . Ivp Dye [Iodinated Diagnostic Agents]     Labs: HEMOGLOBIN A1C- No recent lab on file   Objective: General: Patient is awake, alert, and oriented x 3 and in no acute distress.  Integument: Skin is warm, dry and supple bilateral. Nails x10 are well manicured/polished (patient gets pedicures), short, and thickened with no signs of infection. No open lesions or preulcerative lesions present bilateral. Remaining integument unremarkable.  Vasculature:  Dorsalis Pedis pulse 1/4 bilateral. Posterior Tibial pulse  0/4 bilateral.  Capillary fill time <4 sec 1-5 bilateral. No hair growth to the level of the digits. Temperature gradient decreased. No varicosities present bilateral. No acute ischemic changes or gangrene. No edema present bilateral.   Neurology: The patient has intact sensation measured with a 5.07/10g Semmes Weinstein Monofilament at all pedal sites bilateral . Vibratory sensation diminished bilateral with tuning fork. No Babinski sign present bilateral.   Musculoskeletal:  No gross pedal deformities noted bilateral. Muscular strength 5/5 in all lower extremity muscular groups bilateral without pain or limitation on range of motion . No tenderness with calf compression bilateral.  Assessment and Plan: Problem List Items Addressed This Visit    None    Visit Diagnoses    Type 2 diabetes, uncontrolled, with neuropathy (HCC)    -  Primary    PVD (peripheral vascular disease) with claudication (HCC)        Relevant Medications    digoxin (LANOXIN) 0.125 MG tablet    EPIPEN 2-PAK 0.3  MG/0.3ML SOAJ injection    furosemide (LASIX) 40 MG tablet    Pain in both lower extremities           -Examined patient. -Discussed and educated patient on diabetic foot care, especially with  regards to the vascular, neurological and musculoskeletal systems.  -Stressed the importance of good glycemic control and the detriment of not  controlling glucose levels in relation to the foot. Referral placed with [facility] for Diabetic Education. -Recommend vitamin b complex for nerves and cont Neurontin. If continues to progress recommend neurology consult -Recommend continued close vascular follow up with [facility] -Recommend continue with good supportive shoes daily -Answered all patient questions -Patient to return as needed for at risk foot care -Patient advised to call the office if any problems or questions arise in the  Meantime.  Shannon Baxter, DPM

## 2015-10-26 DIAGNOSIS — R0600 Dyspnea, unspecified: Secondary | ICD-10-CM

## 2015-10-26 DIAGNOSIS — R0609 Other forms of dyspnea: Secondary | ICD-10-CM

## 2015-10-26 HISTORY — DX: Other forms of dyspnea: R06.09

## 2015-10-26 HISTORY — DX: Dyspnea, unspecified: R06.00

## 2015-11-22 DIAGNOSIS — E1151 Type 2 diabetes mellitus with diabetic peripheral angiopathy without gangrene: Secondary | ICD-10-CM | POA: Insufficient documentation

## 2015-11-22 DIAGNOSIS — I060 Rheumatic aortic stenosis: Secondary | ICD-10-CM

## 2015-11-22 HISTORY — DX: Rheumatic aortic stenosis: I06.0

## 2015-11-22 HISTORY — DX: Type 2 diabetes mellitus with diabetic peripheral angiopathy without gangrene: E11.51

## 2016-01-23 DIAGNOSIS — E039 Hypothyroidism, unspecified: Secondary | ICD-10-CM

## 2016-01-23 HISTORY — DX: Hypothyroidism, unspecified: E03.9

## 2016-02-21 DIAGNOSIS — I5023 Acute on chronic systolic (congestive) heart failure: Secondary | ICD-10-CM | POA: Insufficient documentation

## 2016-02-21 HISTORY — DX: Acute on chronic systolic (congestive) heart failure: I50.23

## 2016-03-05 DIAGNOSIS — I061 Rheumatic aortic insufficiency: Secondary | ICD-10-CM | POA: Insufficient documentation

## 2016-03-05 HISTORY — DX: Rheumatic aortic insufficiency: I06.1

## 2016-03-06 DIAGNOSIS — I480 Paroxysmal atrial fibrillation: Secondary | ICD-10-CM

## 2016-03-06 HISTORY — DX: Paroxysmal atrial fibrillation: I48.0

## 2016-03-22 DIAGNOSIS — I2510 Atherosclerotic heart disease of native coronary artery without angina pectoris: Secondary | ICD-10-CM | POA: Diagnosis not present

## 2016-03-22 DIAGNOSIS — Z952 Presence of prosthetic heart valve: Secondary | ICD-10-CM | POA: Diagnosis not present

## 2016-03-22 DIAGNOSIS — Z5181 Encounter for therapeutic drug level monitoring: Secondary | ICD-10-CM | POA: Diagnosis not present

## 2016-03-22 DIAGNOSIS — J410 Simple chronic bronchitis: Secondary | ICD-10-CM | POA: Diagnosis not present

## 2016-03-22 DIAGNOSIS — I5023 Acute on chronic systolic (congestive) heart failure: Secondary | ICD-10-CM | POA: Diagnosis not present

## 2016-03-22 DIAGNOSIS — Z7901 Long term (current) use of anticoagulants: Secondary | ICD-10-CM | POA: Diagnosis not present

## 2016-03-22 DIAGNOSIS — I70213 Atherosclerosis of native arteries of extremities with intermittent claudication, bilateral legs: Secondary | ICD-10-CM | POA: Diagnosis not present

## 2016-03-22 DIAGNOSIS — I480 Paroxysmal atrial fibrillation: Secondary | ICD-10-CM | POA: Diagnosis not present

## 2016-03-22 DIAGNOSIS — J45909 Unspecified asthma, uncomplicated: Secondary | ICD-10-CM | POA: Diagnosis not present

## 2016-03-22 DIAGNOSIS — I429 Cardiomyopathy, unspecified: Secondary | ICD-10-CM | POA: Diagnosis not present

## 2016-03-22 DIAGNOSIS — Z48812 Encounter for surgical aftercare following surgery on the circulatory system: Secondary | ICD-10-CM | POA: Diagnosis not present

## 2016-03-22 DIAGNOSIS — E1151 Type 2 diabetes mellitus with diabetic peripheral angiopathy without gangrene: Secondary | ICD-10-CM | POA: Diagnosis not present

## 2016-03-22 DIAGNOSIS — I110 Hypertensive heart disease with heart failure: Secondary | ICD-10-CM | POA: Diagnosis not present

## 2016-03-23 DIAGNOSIS — Z952 Presence of prosthetic heart valve: Secondary | ICD-10-CM | POA: Diagnosis not present

## 2016-03-23 DIAGNOSIS — I4891 Unspecified atrial fibrillation: Secondary | ICD-10-CM | POA: Diagnosis not present

## 2016-03-23 DIAGNOSIS — I10 Essential (primary) hypertension: Secondary | ICD-10-CM | POA: Diagnosis not present

## 2016-03-23 DIAGNOSIS — I350 Nonrheumatic aortic (valve) stenosis: Secondary | ICD-10-CM | POA: Diagnosis not present

## 2016-03-26 DIAGNOSIS — E1151 Type 2 diabetes mellitus with diabetic peripheral angiopathy without gangrene: Secondary | ICD-10-CM | POA: Diagnosis not present

## 2016-03-26 DIAGNOSIS — Z48812 Encounter for surgical aftercare following surgery on the circulatory system: Secondary | ICD-10-CM | POA: Diagnosis not present

## 2016-03-26 DIAGNOSIS — Z5181 Encounter for therapeutic drug level monitoring: Secondary | ICD-10-CM | POA: Diagnosis not present

## 2016-03-26 DIAGNOSIS — I2510 Atherosclerotic heart disease of native coronary artery without angina pectoris: Secondary | ICD-10-CM | POA: Diagnosis not present

## 2016-03-26 DIAGNOSIS — Z7901 Long term (current) use of anticoagulants: Secondary | ICD-10-CM | POA: Diagnosis not present

## 2016-03-26 DIAGNOSIS — I429 Cardiomyopathy, unspecified: Secondary | ICD-10-CM | POA: Diagnosis not present

## 2016-03-26 DIAGNOSIS — I110 Hypertensive heart disease with heart failure: Secondary | ICD-10-CM | POA: Diagnosis not present

## 2016-03-26 DIAGNOSIS — J410 Simple chronic bronchitis: Secondary | ICD-10-CM | POA: Diagnosis not present

## 2016-03-26 DIAGNOSIS — I5023 Acute on chronic systolic (congestive) heart failure: Secondary | ICD-10-CM | POA: Diagnosis not present

## 2016-03-26 DIAGNOSIS — I70213 Atherosclerosis of native arteries of extremities with intermittent claudication, bilateral legs: Secondary | ICD-10-CM | POA: Diagnosis not present

## 2016-03-26 DIAGNOSIS — J45909 Unspecified asthma, uncomplicated: Secondary | ICD-10-CM | POA: Diagnosis not present

## 2016-03-26 DIAGNOSIS — Z952 Presence of prosthetic heart valve: Secondary | ICD-10-CM | POA: Diagnosis not present

## 2016-03-26 DIAGNOSIS — I480 Paroxysmal atrial fibrillation: Secondary | ICD-10-CM | POA: Diagnosis not present

## 2016-03-29 DIAGNOSIS — I4891 Unspecified atrial fibrillation: Secondary | ICD-10-CM | POA: Diagnosis not present

## 2016-03-29 DIAGNOSIS — I70213 Atherosclerosis of native arteries of extremities with intermittent claudication, bilateral legs: Secondary | ICD-10-CM | POA: Diagnosis not present

## 2016-03-29 DIAGNOSIS — Z952 Presence of prosthetic heart valve: Secondary | ICD-10-CM | POA: Insufficient documentation

## 2016-03-29 DIAGNOSIS — I2510 Atherosclerotic heart disease of native coronary artery without angina pectoris: Secondary | ICD-10-CM | POA: Diagnosis not present

## 2016-03-29 DIAGNOSIS — I6523 Occlusion and stenosis of bilateral carotid arteries: Secondary | ICD-10-CM | POA: Diagnosis not present

## 2016-03-29 DIAGNOSIS — I060 Rheumatic aortic stenosis: Secondary | ICD-10-CM | POA: Diagnosis not present

## 2016-03-29 HISTORY — DX: Presence of prosthetic heart valve: Z95.2

## 2016-03-30 DIAGNOSIS — Z952 Presence of prosthetic heart valve: Secondary | ICD-10-CM | POA: Diagnosis not present

## 2016-03-30 DIAGNOSIS — Z7901 Long term (current) use of anticoagulants: Secondary | ICD-10-CM | POA: Diagnosis not present

## 2016-03-30 DIAGNOSIS — I4891 Unspecified atrial fibrillation: Secondary | ICD-10-CM | POA: Diagnosis not present

## 2016-04-02 DIAGNOSIS — Z7901 Long term (current) use of anticoagulants: Secondary | ICD-10-CM | POA: Diagnosis not present

## 2016-04-02 DIAGNOSIS — I060 Rheumatic aortic stenosis: Secondary | ICD-10-CM | POA: Diagnosis not present

## 2016-04-02 DIAGNOSIS — I4891 Unspecified atrial fibrillation: Secondary | ICD-10-CM | POA: Diagnosis not present

## 2016-04-02 DIAGNOSIS — I5023 Acute on chronic systolic (congestive) heart failure: Secondary | ICD-10-CM | POA: Diagnosis not present

## 2016-04-02 DIAGNOSIS — Z5181 Encounter for therapeutic drug level monitoring: Secondary | ICD-10-CM | POA: Diagnosis not present

## 2016-04-02 DIAGNOSIS — I70213 Atherosclerosis of native arteries of extremities with intermittent claudication, bilateral legs: Secondary | ICD-10-CM | POA: Diagnosis not present

## 2016-04-02 DIAGNOSIS — I2510 Atherosclerotic heart disease of native coronary artery without angina pectoris: Secondary | ICD-10-CM | POA: Diagnosis not present

## 2016-04-02 DIAGNOSIS — J45909 Unspecified asthma, uncomplicated: Secondary | ICD-10-CM | POA: Diagnosis not present

## 2016-04-02 DIAGNOSIS — J410 Simple chronic bronchitis: Secondary | ICD-10-CM | POA: Diagnosis not present

## 2016-04-02 DIAGNOSIS — Z48812 Encounter for surgical aftercare following surgery on the circulatory system: Secondary | ICD-10-CM | POA: Diagnosis not present

## 2016-04-02 DIAGNOSIS — I110 Hypertensive heart disease with heart failure: Secondary | ICD-10-CM | POA: Diagnosis not present

## 2016-04-02 DIAGNOSIS — Z952 Presence of prosthetic heart valve: Secondary | ICD-10-CM | POA: Diagnosis not present

## 2016-04-02 DIAGNOSIS — I429 Cardiomyopathy, unspecified: Secondary | ICD-10-CM | POA: Diagnosis not present

## 2016-04-02 DIAGNOSIS — I480 Paroxysmal atrial fibrillation: Secondary | ICD-10-CM | POA: Diagnosis not present

## 2016-04-02 DIAGNOSIS — E1151 Type 2 diabetes mellitus with diabetic peripheral angiopathy without gangrene: Secondary | ICD-10-CM | POA: Diagnosis not present

## 2016-04-05 DIAGNOSIS — Z0000 Encounter for general adult medical examination without abnormal findings: Secondary | ICD-10-CM | POA: Diagnosis not present

## 2016-04-05 DIAGNOSIS — Z124 Encounter for screening for malignant neoplasm of cervix: Secondary | ICD-10-CM | POA: Diagnosis not present

## 2016-04-05 LAB — HM PAP SMEAR: HM Pap smear: NEGATIVE

## 2016-04-06 DIAGNOSIS — Z952 Presence of prosthetic heart valve: Secondary | ICD-10-CM | POA: Diagnosis not present

## 2016-04-06 DIAGNOSIS — I429 Cardiomyopathy, unspecified: Secondary | ICD-10-CM | POA: Diagnosis not present

## 2016-04-06 DIAGNOSIS — I110 Hypertensive heart disease with heart failure: Secondary | ICD-10-CM | POA: Diagnosis not present

## 2016-04-06 DIAGNOSIS — J45909 Unspecified asthma, uncomplicated: Secondary | ICD-10-CM | POA: Diagnosis not present

## 2016-04-06 DIAGNOSIS — E1151 Type 2 diabetes mellitus with diabetic peripheral angiopathy without gangrene: Secondary | ICD-10-CM | POA: Diagnosis not present

## 2016-04-06 DIAGNOSIS — I5023 Acute on chronic systolic (congestive) heart failure: Secondary | ICD-10-CM | POA: Diagnosis not present

## 2016-04-06 DIAGNOSIS — Z48812 Encounter for surgical aftercare following surgery on the circulatory system: Secondary | ICD-10-CM | POA: Diagnosis not present

## 2016-04-06 DIAGNOSIS — I70213 Atherosclerosis of native arteries of extremities with intermittent claudication, bilateral legs: Secondary | ICD-10-CM | POA: Diagnosis not present

## 2016-04-06 DIAGNOSIS — I480 Paroxysmal atrial fibrillation: Secondary | ICD-10-CM | POA: Diagnosis not present

## 2016-04-06 DIAGNOSIS — J410 Simple chronic bronchitis: Secondary | ICD-10-CM | POA: Diagnosis not present

## 2016-04-06 DIAGNOSIS — Z5181 Encounter for therapeutic drug level monitoring: Secondary | ICD-10-CM | POA: Diagnosis not present

## 2016-04-06 DIAGNOSIS — I2510 Atherosclerotic heart disease of native coronary artery without angina pectoris: Secondary | ICD-10-CM | POA: Diagnosis not present

## 2016-04-06 DIAGNOSIS — Z7901 Long term (current) use of anticoagulants: Secondary | ICD-10-CM | POA: Diagnosis not present

## 2016-04-12 DIAGNOSIS — J45909 Unspecified asthma, uncomplicated: Secondary | ICD-10-CM | POA: Diagnosis not present

## 2016-04-12 DIAGNOSIS — I5023 Acute on chronic systolic (congestive) heart failure: Secondary | ICD-10-CM | POA: Diagnosis not present

## 2016-04-12 DIAGNOSIS — J410 Simple chronic bronchitis: Secondary | ICD-10-CM | POA: Diagnosis not present

## 2016-04-12 DIAGNOSIS — Z5181 Encounter for therapeutic drug level monitoring: Secondary | ICD-10-CM | POA: Diagnosis not present

## 2016-04-12 DIAGNOSIS — Z952 Presence of prosthetic heart valve: Secondary | ICD-10-CM | POA: Diagnosis not present

## 2016-04-12 DIAGNOSIS — E1151 Type 2 diabetes mellitus with diabetic peripheral angiopathy without gangrene: Secondary | ICD-10-CM | POA: Diagnosis not present

## 2016-04-12 DIAGNOSIS — I70213 Atherosclerosis of native arteries of extremities with intermittent claudication, bilateral legs: Secondary | ICD-10-CM | POA: Diagnosis not present

## 2016-04-12 DIAGNOSIS — Z48812 Encounter for surgical aftercare following surgery on the circulatory system: Secondary | ICD-10-CM | POA: Diagnosis not present

## 2016-04-12 DIAGNOSIS — Z7901 Long term (current) use of anticoagulants: Secondary | ICD-10-CM | POA: Diagnosis not present

## 2016-04-12 DIAGNOSIS — I110 Hypertensive heart disease with heart failure: Secondary | ICD-10-CM | POA: Diagnosis not present

## 2016-04-12 DIAGNOSIS — I2510 Atherosclerotic heart disease of native coronary artery without angina pectoris: Secondary | ICD-10-CM | POA: Diagnosis not present

## 2016-04-12 DIAGNOSIS — I480 Paroxysmal atrial fibrillation: Secondary | ICD-10-CM | POA: Diagnosis not present

## 2016-04-12 DIAGNOSIS — I429 Cardiomyopathy, unspecified: Secondary | ICD-10-CM | POA: Diagnosis not present

## 2016-04-16 DIAGNOSIS — I4891 Unspecified atrial fibrillation: Secondary | ICD-10-CM | POA: Diagnosis not present

## 2016-04-16 DIAGNOSIS — Z7901 Long term (current) use of anticoagulants: Secondary | ICD-10-CM | POA: Diagnosis not present

## 2016-04-16 DIAGNOSIS — Z952 Presence of prosthetic heart valve: Secondary | ICD-10-CM | POA: Diagnosis not present

## 2016-04-16 DIAGNOSIS — I060 Rheumatic aortic stenosis: Secondary | ICD-10-CM | POA: Diagnosis not present

## 2016-04-18 DIAGNOSIS — Z952 Presence of prosthetic heart valve: Secondary | ICD-10-CM | POA: Diagnosis not present

## 2016-04-18 DIAGNOSIS — I4891 Unspecified atrial fibrillation: Secondary | ICD-10-CM | POA: Diagnosis not present

## 2016-04-18 DIAGNOSIS — E1151 Type 2 diabetes mellitus with diabetic peripheral angiopathy without gangrene: Secondary | ICD-10-CM | POA: Diagnosis not present

## 2016-04-18 DIAGNOSIS — I060 Rheumatic aortic stenosis: Secondary | ICD-10-CM | POA: Diagnosis not present

## 2016-04-18 DIAGNOSIS — Z7901 Long term (current) use of anticoagulants: Secondary | ICD-10-CM | POA: Diagnosis not present

## 2016-04-23 DIAGNOSIS — Z952 Presence of prosthetic heart valve: Secondary | ICD-10-CM | POA: Diagnosis not present

## 2016-04-23 DIAGNOSIS — Z7901 Long term (current) use of anticoagulants: Secondary | ICD-10-CM | POA: Diagnosis not present

## 2016-04-23 DIAGNOSIS — I4891 Unspecified atrial fibrillation: Secondary | ICD-10-CM | POA: Diagnosis not present

## 2016-04-23 DIAGNOSIS — I060 Rheumatic aortic stenosis: Secondary | ICD-10-CM | POA: Diagnosis not present

## 2016-04-25 DIAGNOSIS — I4891 Unspecified atrial fibrillation: Secondary | ICD-10-CM | POA: Diagnosis not present

## 2016-04-25 DIAGNOSIS — I060 Rheumatic aortic stenosis: Secondary | ICD-10-CM | POA: Diagnosis not present

## 2016-04-25 DIAGNOSIS — Z952 Presence of prosthetic heart valve: Secondary | ICD-10-CM | POA: Diagnosis not present

## 2016-04-25 DIAGNOSIS — Z7901 Long term (current) use of anticoagulants: Secondary | ICD-10-CM | POA: Diagnosis not present

## 2016-04-27 DIAGNOSIS — I060 Rheumatic aortic stenosis: Secondary | ICD-10-CM | POA: Diagnosis not present

## 2016-04-27 DIAGNOSIS — I4891 Unspecified atrial fibrillation: Secondary | ICD-10-CM | POA: Diagnosis not present

## 2016-04-27 DIAGNOSIS — Z952 Presence of prosthetic heart valve: Secondary | ICD-10-CM | POA: Diagnosis not present

## 2016-04-27 DIAGNOSIS — Z7901 Long term (current) use of anticoagulants: Secondary | ICD-10-CM | POA: Diagnosis not present

## 2016-05-03 DIAGNOSIS — I4891 Unspecified atrial fibrillation: Secondary | ICD-10-CM | POA: Diagnosis not present

## 2016-05-03 DIAGNOSIS — Z952 Presence of prosthetic heart valve: Secondary | ICD-10-CM | POA: Diagnosis not present

## 2016-05-03 DIAGNOSIS — I060 Rheumatic aortic stenosis: Secondary | ICD-10-CM | POA: Diagnosis not present

## 2016-05-03 DIAGNOSIS — Z7901 Long term (current) use of anticoagulants: Secondary | ICD-10-CM | POA: Diagnosis not present

## 2016-05-07 DIAGNOSIS — Z952 Presence of prosthetic heart valve: Secondary | ICD-10-CM | POA: Diagnosis not present

## 2016-05-07 DIAGNOSIS — I4891 Unspecified atrial fibrillation: Secondary | ICD-10-CM | POA: Diagnosis not present

## 2016-05-07 DIAGNOSIS — Z7901 Long term (current) use of anticoagulants: Secondary | ICD-10-CM | POA: Diagnosis not present

## 2016-05-07 DIAGNOSIS — I060 Rheumatic aortic stenosis: Secondary | ICD-10-CM | POA: Diagnosis not present

## 2016-05-08 DIAGNOSIS — E785 Hyperlipidemia, unspecified: Secondary | ICD-10-CM | POA: Diagnosis not present

## 2016-05-08 DIAGNOSIS — E1140 Type 2 diabetes mellitus with diabetic neuropathy, unspecified: Secondary | ICD-10-CM | POA: Diagnosis not present

## 2016-05-08 DIAGNOSIS — I10 Essential (primary) hypertension: Secondary | ICD-10-CM | POA: Diagnosis not present

## 2016-05-08 DIAGNOSIS — Z1389 Encounter for screening for other disorder: Secondary | ICD-10-CM | POA: Diagnosis not present

## 2016-05-08 DIAGNOSIS — Z79899 Other long term (current) drug therapy: Secondary | ICD-10-CM | POA: Diagnosis not present

## 2016-05-08 DIAGNOSIS — I2510 Atherosclerotic heart disease of native coronary artery without angina pectoris: Secondary | ICD-10-CM | POA: Diagnosis not present

## 2016-05-14 DIAGNOSIS — Z7901 Long term (current) use of anticoagulants: Secondary | ICD-10-CM | POA: Diagnosis not present

## 2016-05-14 DIAGNOSIS — I4891 Unspecified atrial fibrillation: Secondary | ICD-10-CM | POA: Diagnosis not present

## 2016-05-14 DIAGNOSIS — Z952 Presence of prosthetic heart valve: Secondary | ICD-10-CM | POA: Diagnosis not present

## 2016-05-16 DIAGNOSIS — G44209 Tension-type headache, unspecified, not intractable: Secondary | ICD-10-CM | POA: Diagnosis not present

## 2016-05-16 DIAGNOSIS — I10 Essential (primary) hypertension: Secondary | ICD-10-CM | POA: Diagnosis not present

## 2016-05-18 DIAGNOSIS — Z952 Presence of prosthetic heart valve: Secondary | ICD-10-CM | POA: Diagnosis not present

## 2016-05-18 DIAGNOSIS — Z7901 Long term (current) use of anticoagulants: Secondary | ICD-10-CM | POA: Diagnosis not present

## 2016-05-18 DIAGNOSIS — G4733 Obstructive sleep apnea (adult) (pediatric): Secondary | ICD-10-CM | POA: Diagnosis not present

## 2016-05-18 DIAGNOSIS — I4891 Unspecified atrial fibrillation: Secondary | ICD-10-CM | POA: Diagnosis not present

## 2016-05-21 DIAGNOSIS — J4530 Mild persistent asthma, uncomplicated: Secondary | ICD-10-CM | POA: Diagnosis not present

## 2016-05-21 DIAGNOSIS — R5383 Other fatigue: Secondary | ICD-10-CM | POA: Diagnosis not present

## 2016-05-21 DIAGNOSIS — G4733 Obstructive sleep apnea (adult) (pediatric): Secondary | ICD-10-CM | POA: Diagnosis not present

## 2016-05-25 DIAGNOSIS — Z952 Presence of prosthetic heart valve: Secondary | ICD-10-CM | POA: Diagnosis not present

## 2016-05-25 DIAGNOSIS — I4891 Unspecified atrial fibrillation: Secondary | ICD-10-CM | POA: Diagnosis not present

## 2016-05-25 DIAGNOSIS — Z7901 Long term (current) use of anticoagulants: Secondary | ICD-10-CM | POA: Diagnosis not present

## 2016-05-28 DIAGNOSIS — I739 Peripheral vascular disease, unspecified: Secondary | ICD-10-CM | POA: Diagnosis not present

## 2016-05-28 DIAGNOSIS — I4891 Unspecified atrial fibrillation: Secondary | ICD-10-CM | POA: Diagnosis not present

## 2016-05-28 DIAGNOSIS — I2510 Atherosclerotic heart disease of native coronary artery without angina pectoris: Secondary | ICD-10-CM | POA: Diagnosis not present

## 2016-05-28 DIAGNOSIS — I060 Rheumatic aortic stenosis: Secondary | ICD-10-CM | POA: Diagnosis not present

## 2016-05-28 DIAGNOSIS — I6529 Occlusion and stenosis of unspecified carotid artery: Secondary | ICD-10-CM | POA: Diagnosis not present

## 2016-06-01 DIAGNOSIS — I4891 Unspecified atrial fibrillation: Secondary | ICD-10-CM | POA: Diagnosis not present

## 2016-06-01 DIAGNOSIS — Z7901 Long term (current) use of anticoagulants: Secondary | ICD-10-CM | POA: Diagnosis not present

## 2016-06-01 DIAGNOSIS — Z952 Presence of prosthetic heart valve: Secondary | ICD-10-CM | POA: Diagnosis not present

## 2016-06-07 DIAGNOSIS — B3789 Other sites of candidiasis: Secondary | ICD-10-CM | POA: Diagnosis not present

## 2016-06-13 DIAGNOSIS — Z952 Presence of prosthetic heart valve: Secondary | ICD-10-CM | POA: Diagnosis not present

## 2016-06-13 DIAGNOSIS — I6529 Occlusion and stenosis of unspecified carotid artery: Secondary | ICD-10-CM | POA: Diagnosis not present

## 2016-06-15 DIAGNOSIS — I4891 Unspecified atrial fibrillation: Secondary | ICD-10-CM | POA: Diagnosis not present

## 2016-06-15 DIAGNOSIS — Z952 Presence of prosthetic heart valve: Secondary | ICD-10-CM | POA: Diagnosis not present

## 2016-06-15 DIAGNOSIS — Z7901 Long term (current) use of anticoagulants: Secondary | ICD-10-CM | POA: Diagnosis not present

## 2016-06-19 DIAGNOSIS — G4733 Obstructive sleep apnea (adult) (pediatric): Secondary | ICD-10-CM | POA: Diagnosis not present

## 2016-07-09 DIAGNOSIS — I2510 Atherosclerotic heart disease of native coronary artery without angina pectoris: Secondary | ICD-10-CM | POA: Diagnosis not present

## 2016-07-09 DIAGNOSIS — I739 Peripheral vascular disease, unspecified: Secondary | ICD-10-CM | POA: Diagnosis not present

## 2016-07-09 DIAGNOSIS — Z952 Presence of prosthetic heart valve: Secondary | ICD-10-CM | POA: Diagnosis not present

## 2016-07-09 DIAGNOSIS — I6523 Occlusion and stenosis of bilateral carotid arteries: Secondary | ICD-10-CM | POA: Diagnosis not present

## 2016-07-09 DIAGNOSIS — I5023 Acute on chronic systolic (congestive) heart failure: Secondary | ICD-10-CM | POA: Diagnosis not present

## 2016-07-13 DIAGNOSIS — Z952 Presence of prosthetic heart valve: Secondary | ICD-10-CM | POA: Diagnosis not present

## 2016-07-13 DIAGNOSIS — E119 Type 2 diabetes mellitus without complications: Secondary | ICD-10-CM | POA: Diagnosis not present

## 2016-07-13 DIAGNOSIS — I2510 Atherosclerotic heart disease of native coronary artery without angina pectoris: Secondary | ICD-10-CM | POA: Diagnosis not present

## 2016-07-13 DIAGNOSIS — Z7901 Long term (current) use of anticoagulants: Secondary | ICD-10-CM | POA: Diagnosis not present

## 2016-07-16 DIAGNOSIS — I4891 Unspecified atrial fibrillation: Secondary | ICD-10-CM | POA: Diagnosis not present

## 2016-07-16 DIAGNOSIS — Z7901 Long term (current) use of anticoagulants: Secondary | ICD-10-CM | POA: Diagnosis not present

## 2016-07-16 DIAGNOSIS — Z952 Presence of prosthetic heart valve: Secondary | ICD-10-CM | POA: Diagnosis not present

## 2016-07-18 DIAGNOSIS — Z7901 Long term (current) use of anticoagulants: Secondary | ICD-10-CM | POA: Diagnosis not present

## 2016-07-18 DIAGNOSIS — I4891 Unspecified atrial fibrillation: Secondary | ICD-10-CM | POA: Diagnosis not present

## 2016-07-18 DIAGNOSIS — Z952 Presence of prosthetic heart valve: Secondary | ICD-10-CM | POA: Diagnosis not present

## 2016-08-07 DIAGNOSIS — I4891 Unspecified atrial fibrillation: Secondary | ICD-10-CM | POA: Diagnosis not present

## 2016-08-07 DIAGNOSIS — I2510 Atherosclerotic heart disease of native coronary artery without angina pectoris: Secondary | ICD-10-CM | POA: Diagnosis not present

## 2016-08-07 DIAGNOSIS — Z7901 Long term (current) use of anticoagulants: Secondary | ICD-10-CM | POA: Diagnosis not present

## 2016-08-07 DIAGNOSIS — I10 Essential (primary) hypertension: Secondary | ICD-10-CM | POA: Diagnosis not present

## 2016-08-07 DIAGNOSIS — I6529 Occlusion and stenosis of unspecified carotid artery: Secondary | ICD-10-CM | POA: Diagnosis not present

## 2016-08-07 DIAGNOSIS — I739 Peripheral vascular disease, unspecified: Secondary | ICD-10-CM | POA: Diagnosis not present

## 2016-08-07 DIAGNOSIS — Z952 Presence of prosthetic heart valve: Secondary | ICD-10-CM | POA: Diagnosis not present

## 2016-08-13 DIAGNOSIS — G4733 Obstructive sleep apnea (adult) (pediatric): Secondary | ICD-10-CM | POA: Diagnosis not present

## 2016-08-13 DIAGNOSIS — J4530 Mild persistent asthma, uncomplicated: Secondary | ICD-10-CM | POA: Diagnosis not present

## 2016-08-13 DIAGNOSIS — R5383 Other fatigue: Secondary | ICD-10-CM | POA: Diagnosis not present

## 2016-08-29 DIAGNOSIS — E1140 Type 2 diabetes mellitus with diabetic neuropathy, unspecified: Secondary | ICD-10-CM | POA: Diagnosis not present

## 2016-08-29 DIAGNOSIS — E785 Hyperlipidemia, unspecified: Secondary | ICD-10-CM | POA: Diagnosis not present

## 2016-08-29 DIAGNOSIS — N390 Urinary tract infection, site not specified: Secondary | ICD-10-CM | POA: Diagnosis not present

## 2016-08-29 DIAGNOSIS — I10 Essential (primary) hypertension: Secondary | ICD-10-CM | POA: Diagnosis not present

## 2016-09-03 DIAGNOSIS — Z952 Presence of prosthetic heart valve: Secondary | ICD-10-CM | POA: Diagnosis not present

## 2016-09-03 DIAGNOSIS — I420 Dilated cardiomyopathy: Secondary | ICD-10-CM | POA: Insufficient documentation

## 2016-09-03 DIAGNOSIS — I2510 Atherosclerotic heart disease of native coronary artery without angina pectoris: Secondary | ICD-10-CM | POA: Diagnosis not present

## 2016-09-03 DIAGNOSIS — I739 Peripheral vascular disease, unspecified: Secondary | ICD-10-CM | POA: Diagnosis not present

## 2016-09-03 HISTORY — DX: Dilated cardiomyopathy: I42.0

## 2016-09-05 DIAGNOSIS — Z952 Presence of prosthetic heart valve: Secondary | ICD-10-CM | POA: Diagnosis not present

## 2016-09-05 DIAGNOSIS — I2510 Atherosclerotic heart disease of native coronary artery without angina pectoris: Secondary | ICD-10-CM | POA: Diagnosis not present

## 2016-09-05 DIAGNOSIS — I420 Dilated cardiomyopathy: Secondary | ICD-10-CM | POA: Diagnosis not present

## 2016-09-05 DIAGNOSIS — I739 Peripheral vascular disease, unspecified: Secondary | ICD-10-CM | POA: Diagnosis not present

## 2016-09-06 DIAGNOSIS — Z952 Presence of prosthetic heart valve: Secondary | ICD-10-CM | POA: Diagnosis not present

## 2016-09-06 DIAGNOSIS — Z7901 Long term (current) use of anticoagulants: Secondary | ICD-10-CM | POA: Diagnosis not present

## 2016-09-06 DIAGNOSIS — I2510 Atherosclerotic heart disease of native coronary artery without angina pectoris: Secondary | ICD-10-CM | POA: Diagnosis not present

## 2016-09-06 DIAGNOSIS — I4891 Unspecified atrial fibrillation: Secondary | ICD-10-CM | POA: Diagnosis not present

## 2016-09-17 DIAGNOSIS — I25119 Atherosclerotic heart disease of native coronary artery with unspecified angina pectoris: Secondary | ICD-10-CM | POA: Diagnosis not present

## 2016-09-20 DIAGNOSIS — G4733 Obstructive sleep apnea (adult) (pediatric): Secondary | ICD-10-CM | POA: Diagnosis not present

## 2016-09-26 DIAGNOSIS — I350 Nonrheumatic aortic (valve) stenosis: Secondary | ICD-10-CM | POA: Diagnosis not present

## 2016-09-27 DIAGNOSIS — I519 Heart disease, unspecified: Secondary | ICD-10-CM | POA: Diagnosis not present

## 2016-09-27 DIAGNOSIS — I420 Dilated cardiomyopathy: Secondary | ICD-10-CM | POA: Diagnosis not present

## 2016-09-27 DIAGNOSIS — Z952 Presence of prosthetic heart valve: Secondary | ICD-10-CM | POA: Diagnosis not present

## 2016-09-27 DIAGNOSIS — I2510 Atherosclerotic heart disease of native coronary artery without angina pectoris: Secondary | ICD-10-CM | POA: Diagnosis not present

## 2016-09-27 DIAGNOSIS — I739 Peripheral vascular disease, unspecified: Secondary | ICD-10-CM | POA: Diagnosis not present

## 2016-10-04 DIAGNOSIS — I739 Peripheral vascular disease, unspecified: Secondary | ICD-10-CM | POA: Diagnosis not present

## 2016-10-04 DIAGNOSIS — I480 Paroxysmal atrial fibrillation: Secondary | ICD-10-CM | POA: Diagnosis not present

## 2016-10-04 DIAGNOSIS — I10 Essential (primary) hypertension: Secondary | ICD-10-CM | POA: Diagnosis not present

## 2016-10-04 DIAGNOSIS — Z7901 Long term (current) use of anticoagulants: Secondary | ICD-10-CM | POA: Diagnosis not present

## 2016-10-04 DIAGNOSIS — I420 Dilated cardiomyopathy: Secondary | ICD-10-CM | POA: Diagnosis not present

## 2016-10-04 DIAGNOSIS — E785 Hyperlipidemia, unspecified: Secondary | ICD-10-CM | POA: Diagnosis not present

## 2016-10-13 DIAGNOSIS — R000 Tachycardia, unspecified: Secondary | ICD-10-CM | POA: Diagnosis not present

## 2016-10-13 DIAGNOSIS — R079 Chest pain, unspecified: Secondary | ICD-10-CM | POA: Diagnosis not present

## 2016-10-13 DIAGNOSIS — Z952 Presence of prosthetic heart valve: Secondary | ICD-10-CM | POA: Diagnosis not present

## 2016-10-13 DIAGNOSIS — I4891 Unspecified atrial fibrillation: Secondary | ICD-10-CM | POA: Diagnosis not present

## 2016-10-13 DIAGNOSIS — I429 Cardiomyopathy, unspecified: Secondary | ICD-10-CM | POA: Diagnosis not present

## 2016-10-13 DIAGNOSIS — E7800 Pure hypercholesterolemia, unspecified: Secondary | ICD-10-CM | POA: Diagnosis not present

## 2016-10-13 DIAGNOSIS — I10 Essential (primary) hypertension: Secondary | ICD-10-CM | POA: Diagnosis not present

## 2016-10-13 DIAGNOSIS — I4892 Unspecified atrial flutter: Secondary | ICD-10-CM | POA: Diagnosis not present

## 2016-10-16 DIAGNOSIS — Z1231 Encounter for screening mammogram for malignant neoplasm of breast: Secondary | ICD-10-CM | POA: Diagnosis not present

## 2016-10-17 DIAGNOSIS — I420 Dilated cardiomyopathy: Secondary | ICD-10-CM | POA: Diagnosis not present

## 2016-10-17 DIAGNOSIS — J9811 Atelectasis: Secondary | ICD-10-CM | POA: Diagnosis not present

## 2016-10-18 DIAGNOSIS — Z952 Presence of prosthetic heart valve: Secondary | ICD-10-CM | POA: Diagnosis not present

## 2016-10-18 DIAGNOSIS — Z7901 Long term (current) use of anticoagulants: Secondary | ICD-10-CM | POA: Diagnosis not present

## 2016-10-18 DIAGNOSIS — I4891 Unspecified atrial fibrillation: Secondary | ICD-10-CM | POA: Diagnosis not present

## 2016-10-23 DIAGNOSIS — J9811 Atelectasis: Secondary | ICD-10-CM | POA: Diagnosis not present

## 2016-10-23 DIAGNOSIS — I352 Nonrheumatic aortic (valve) stenosis with insufficiency: Secondary | ICD-10-CM | POA: Diagnosis not present

## 2016-10-23 DIAGNOSIS — Z95810 Presence of automatic (implantable) cardiac defibrillator: Secondary | ICD-10-CM | POA: Insufficient documentation

## 2016-10-23 DIAGNOSIS — Z7951 Long term (current) use of inhaled steroids: Secondary | ICD-10-CM | POA: Diagnosis not present

## 2016-10-23 DIAGNOSIS — J45909 Unspecified asthma, uncomplicated: Secondary | ICD-10-CM | POA: Diagnosis not present

## 2016-10-23 DIAGNOSIS — I501 Left ventricular failure: Secondary | ICD-10-CM | POA: Diagnosis not present

## 2016-10-23 DIAGNOSIS — Z79899 Other long term (current) drug therapy: Secondary | ICD-10-CM | POA: Diagnosis not present

## 2016-10-23 DIAGNOSIS — G4730 Sleep apnea, unspecified: Secondary | ICD-10-CM | POA: Diagnosis not present

## 2016-10-23 DIAGNOSIS — Z7901 Long term (current) use of anticoagulants: Secondary | ICD-10-CM | POA: Diagnosis not present

## 2016-10-23 DIAGNOSIS — Z87891 Personal history of nicotine dependence: Secondary | ICD-10-CM | POA: Diagnosis not present

## 2016-10-23 DIAGNOSIS — I2510 Atherosclerotic heart disease of native coronary artery without angina pectoris: Secondary | ICD-10-CM | POA: Diagnosis not present

## 2016-10-23 DIAGNOSIS — I420 Dilated cardiomyopathy: Secondary | ICD-10-CM | POA: Diagnosis not present

## 2016-10-23 DIAGNOSIS — Z952 Presence of prosthetic heart valve: Secondary | ICD-10-CM | POA: Diagnosis not present

## 2016-10-23 DIAGNOSIS — Z950 Presence of cardiac pacemaker: Secondary | ICD-10-CM | POA: Diagnosis not present

## 2016-10-23 DIAGNOSIS — E1151 Type 2 diabetes mellitus with diabetic peripheral angiopathy without gangrene: Secondary | ICD-10-CM | POA: Diagnosis not present

## 2016-10-23 DIAGNOSIS — I509 Heart failure, unspecified: Secondary | ICD-10-CM | POA: Diagnosis not present

## 2016-10-23 DIAGNOSIS — Z8673 Personal history of transient ischemic attack (TIA), and cerebral infarction without residual deficits: Secondary | ICD-10-CM | POA: Diagnosis not present

## 2016-10-23 DIAGNOSIS — I472 Ventricular tachycardia: Secondary | ICD-10-CM | POA: Diagnosis not present

## 2016-10-23 DIAGNOSIS — Z7902 Long term (current) use of antithrombotics/antiplatelets: Secondary | ICD-10-CM | POA: Diagnosis not present

## 2016-10-23 HISTORY — DX: Presence of automatic (implantable) cardiac defibrillator: Z95.810

## 2016-10-24 DIAGNOSIS — I420 Dilated cardiomyopathy: Secondary | ICD-10-CM | POA: Diagnosis not present

## 2016-11-01 DIAGNOSIS — Z7901 Long term (current) use of anticoagulants: Secondary | ICD-10-CM | POA: Diagnosis not present

## 2016-11-01 DIAGNOSIS — Z952 Presence of prosthetic heart valve: Secondary | ICD-10-CM | POA: Diagnosis not present

## 2016-11-01 DIAGNOSIS — I4891 Unspecified atrial fibrillation: Secondary | ICD-10-CM | POA: Diagnosis not present

## 2016-11-02 DIAGNOSIS — I4891 Unspecified atrial fibrillation: Secondary | ICD-10-CM | POA: Diagnosis not present

## 2016-11-02 DIAGNOSIS — Z79899 Other long term (current) drug therapy: Secondary | ICD-10-CM | POA: Diagnosis not present

## 2016-11-02 DIAGNOSIS — E785 Hyperlipidemia, unspecified: Secondary | ICD-10-CM | POA: Diagnosis not present

## 2016-11-02 DIAGNOSIS — I10 Essential (primary) hypertension: Secondary | ICD-10-CM | POA: Diagnosis not present

## 2016-11-02 DIAGNOSIS — E1140 Type 2 diabetes mellitus with diabetic neuropathy, unspecified: Secondary | ICD-10-CM | POA: Diagnosis not present

## 2016-11-02 DIAGNOSIS — I2510 Atherosclerotic heart disease of native coronary artery without angina pectoris: Secondary | ICD-10-CM | POA: Diagnosis not present

## 2016-11-13 DIAGNOSIS — I420 Dilated cardiomyopathy: Secondary | ICD-10-CM | POA: Diagnosis not present

## 2016-11-13 DIAGNOSIS — Z4502 Encounter for adjustment and management of automatic implantable cardiac defibrillator: Secondary | ICD-10-CM | POA: Diagnosis not present

## 2016-11-13 DIAGNOSIS — I509 Heart failure, unspecified: Secondary | ICD-10-CM | POA: Diagnosis not present

## 2016-11-29 DIAGNOSIS — Z45018 Encounter for adjustment and management of other part of cardiac pacemaker: Secondary | ICD-10-CM | POA: Diagnosis not present

## 2016-11-29 DIAGNOSIS — I4891 Unspecified atrial fibrillation: Secondary | ICD-10-CM | POA: Diagnosis not present

## 2016-11-30 DIAGNOSIS — M25562 Pain in left knee: Secondary | ICD-10-CM | POA: Diagnosis not present

## 2016-12-04 DIAGNOSIS — M1712 Unilateral primary osteoarthritis, left knee: Secondary | ICD-10-CM | POA: Diagnosis not present

## 2016-12-06 DIAGNOSIS — I5022 Chronic systolic (congestive) heart failure: Secondary | ICD-10-CM | POA: Insufficient documentation

## 2016-12-06 DIAGNOSIS — I420 Dilated cardiomyopathy: Secondary | ICD-10-CM | POA: Diagnosis not present

## 2016-12-06 DIAGNOSIS — Z952 Presence of prosthetic heart valve: Secondary | ICD-10-CM | POA: Diagnosis not present

## 2016-12-06 DIAGNOSIS — Z7901 Long term (current) use of anticoagulants: Secondary | ICD-10-CM | POA: Diagnosis not present

## 2016-12-06 DIAGNOSIS — I4891 Unspecified atrial fibrillation: Secondary | ICD-10-CM | POA: Diagnosis not present

## 2016-12-06 HISTORY — DX: Morbid (severe) obesity due to excess calories: E66.01

## 2016-12-06 HISTORY — DX: Chronic systolic (congestive) heart failure: I50.22

## 2016-12-17 DIAGNOSIS — G4733 Obstructive sleep apnea (adult) (pediatric): Secondary | ICD-10-CM | POA: Diagnosis not present

## 2016-12-17 DIAGNOSIS — R5383 Other fatigue: Secondary | ICD-10-CM | POA: Diagnosis not present

## 2016-12-17 DIAGNOSIS — J4530 Mild persistent asthma, uncomplicated: Secondary | ICD-10-CM | POA: Diagnosis not present

## 2016-12-20 DIAGNOSIS — G4733 Obstructive sleep apnea (adult) (pediatric): Secondary | ICD-10-CM | POA: Diagnosis not present

## 2017-01-03 DIAGNOSIS — I4891 Unspecified atrial fibrillation: Secondary | ICD-10-CM | POA: Diagnosis not present

## 2017-01-03 DIAGNOSIS — Z7901 Long term (current) use of anticoagulants: Secondary | ICD-10-CM | POA: Diagnosis not present

## 2017-01-03 DIAGNOSIS — Z952 Presence of prosthetic heart valve: Secondary | ICD-10-CM | POA: Diagnosis not present

## 2017-01-17 DIAGNOSIS — I4891 Unspecified atrial fibrillation: Secondary | ICD-10-CM | POA: Diagnosis not present

## 2017-01-17 DIAGNOSIS — Z7901 Long term (current) use of anticoagulants: Secondary | ICD-10-CM | POA: Diagnosis not present

## 2017-01-17 DIAGNOSIS — Z952 Presence of prosthetic heart valve: Secondary | ICD-10-CM | POA: Diagnosis not present

## 2017-01-23 DIAGNOSIS — J019 Acute sinusitis, unspecified: Secondary | ICD-10-CM | POA: Diagnosis not present

## 2017-01-24 DIAGNOSIS — Z952 Presence of prosthetic heart valve: Secondary | ICD-10-CM | POA: Diagnosis not present

## 2017-01-24 DIAGNOSIS — Z7901 Long term (current) use of anticoagulants: Secondary | ICD-10-CM | POA: Diagnosis not present

## 2017-01-24 DIAGNOSIS — I4891 Unspecified atrial fibrillation: Secondary | ICD-10-CM | POA: Diagnosis not present

## 2017-01-30 DIAGNOSIS — I10 Essential (primary) hypertension: Secondary | ICD-10-CM | POA: Diagnosis not present

## 2017-02-04 DIAGNOSIS — Z95810 Presence of automatic (implantable) cardiac defibrillator: Secondary | ICD-10-CM | POA: Diagnosis not present

## 2017-02-08 DIAGNOSIS — I4891 Unspecified atrial fibrillation: Secondary | ICD-10-CM | POA: Diagnosis not present

## 2017-02-08 DIAGNOSIS — Z952 Presence of prosthetic heart valve: Secondary | ICD-10-CM | POA: Diagnosis not present

## 2017-02-08 DIAGNOSIS — Z7901 Long term (current) use of anticoagulants: Secondary | ICD-10-CM | POA: Diagnosis not present

## 2017-02-11 DIAGNOSIS — I4891 Unspecified atrial fibrillation: Secondary | ICD-10-CM | POA: Diagnosis not present

## 2017-02-11 DIAGNOSIS — Z7901 Long term (current) use of anticoagulants: Secondary | ICD-10-CM | POA: Diagnosis not present

## 2017-02-11 DIAGNOSIS — Z952 Presence of prosthetic heart valve: Secondary | ICD-10-CM | POA: Diagnosis not present

## 2017-02-22 DIAGNOSIS — S8992XA Unspecified injury of left lower leg, initial encounter: Secondary | ICD-10-CM | POA: Diagnosis not present

## 2017-02-22 DIAGNOSIS — M25562 Pain in left knee: Secondary | ICD-10-CM | POA: Diagnosis not present

## 2017-02-26 DIAGNOSIS — I4891 Unspecified atrial fibrillation: Secondary | ICD-10-CM | POA: Diagnosis not present

## 2017-02-26 DIAGNOSIS — Z952 Presence of prosthetic heart valve: Secondary | ICD-10-CM | POA: Diagnosis not present

## 2017-02-26 DIAGNOSIS — Z7901 Long term (current) use of anticoagulants: Secondary | ICD-10-CM | POA: Diagnosis not present

## 2017-03-01 DIAGNOSIS — Z7901 Long term (current) use of anticoagulants: Secondary | ICD-10-CM | POA: Diagnosis not present

## 2017-03-01 DIAGNOSIS — Z952 Presence of prosthetic heart valve: Secondary | ICD-10-CM | POA: Diagnosis not present

## 2017-03-01 DIAGNOSIS — I4891 Unspecified atrial fibrillation: Secondary | ICD-10-CM | POA: Diagnosis not present

## 2017-03-05 DIAGNOSIS — I4891 Unspecified atrial fibrillation: Secondary | ICD-10-CM | POA: Diagnosis not present

## 2017-03-05 DIAGNOSIS — Z952 Presence of prosthetic heart valve: Secondary | ICD-10-CM | POA: Diagnosis not present

## 2017-03-05 DIAGNOSIS — Z7901 Long term (current) use of anticoagulants: Secondary | ICD-10-CM | POA: Diagnosis not present

## 2017-03-08 DIAGNOSIS — I4891 Unspecified atrial fibrillation: Secondary | ICD-10-CM | POA: Diagnosis not present

## 2017-03-08 DIAGNOSIS — Z952 Presence of prosthetic heart valve: Secondary | ICD-10-CM | POA: Diagnosis not present

## 2017-03-14 DIAGNOSIS — I5022 Chronic systolic (congestive) heart failure: Secondary | ICD-10-CM | POA: Diagnosis not present

## 2017-03-14 DIAGNOSIS — Z4502 Encounter for adjustment and management of automatic implantable cardiac defibrillator: Secondary | ICD-10-CM | POA: Diagnosis not present

## 2017-03-25 DIAGNOSIS — J4530 Mild persistent asthma, uncomplicated: Secondary | ICD-10-CM | POA: Diagnosis not present

## 2017-03-25 DIAGNOSIS — R5383 Other fatigue: Secondary | ICD-10-CM | POA: Diagnosis not present

## 2017-03-25 DIAGNOSIS — G4733 Obstructive sleep apnea (adult) (pediatric): Secondary | ICD-10-CM | POA: Diagnosis not present

## 2017-03-28 DIAGNOSIS — Z0000 Encounter for general adult medical examination without abnormal findings: Secondary | ICD-10-CM | POA: Diagnosis not present

## 2017-03-28 DIAGNOSIS — E785 Hyperlipidemia, unspecified: Secondary | ICD-10-CM | POA: Diagnosis not present

## 2017-03-28 DIAGNOSIS — Z136 Encounter for screening for cardiovascular disorders: Secondary | ICD-10-CM | POA: Diagnosis not present

## 2017-03-28 DIAGNOSIS — Z1389 Encounter for screening for other disorder: Secondary | ICD-10-CM | POA: Diagnosis not present

## 2017-03-28 DIAGNOSIS — Z9181 History of falling: Secondary | ICD-10-CM | POA: Diagnosis not present

## 2017-04-04 DIAGNOSIS — I060 Rheumatic aortic stenosis: Secondary | ICD-10-CM | POA: Diagnosis not present

## 2017-04-04 DIAGNOSIS — I6523 Occlusion and stenosis of bilateral carotid arteries: Secondary | ICD-10-CM | POA: Diagnosis not present

## 2017-04-04 DIAGNOSIS — I5022 Chronic systolic (congestive) heart failure: Secondary | ICD-10-CM | POA: Diagnosis not present

## 2017-04-04 DIAGNOSIS — I70213 Atherosclerosis of native arteries of extremities with intermittent claudication, bilateral legs: Secondary | ICD-10-CM | POA: Diagnosis not present

## 2017-04-04 DIAGNOSIS — Z952 Presence of prosthetic heart valve: Secondary | ICD-10-CM | POA: Diagnosis not present

## 2017-04-11 DIAGNOSIS — Z0000 Encounter for general adult medical examination without abnormal findings: Secondary | ICD-10-CM | POA: Diagnosis not present

## 2017-04-22 DIAGNOSIS — Z23 Encounter for immunization: Secondary | ICD-10-CM | POA: Diagnosis not present

## 2017-04-23 DIAGNOSIS — Z952 Presence of prosthetic heart valve: Secondary | ICD-10-CM | POA: Diagnosis not present

## 2017-04-23 DIAGNOSIS — I70213 Atherosclerosis of native arteries of extremities with intermittent claudication, bilateral legs: Secondary | ICD-10-CM | POA: Diagnosis not present

## 2017-04-23 DIAGNOSIS — I060 Rheumatic aortic stenosis: Secondary | ICD-10-CM | POA: Diagnosis not present

## 2017-04-23 DIAGNOSIS — I6523 Occlusion and stenosis of bilateral carotid arteries: Secondary | ICD-10-CM | POA: Diagnosis not present

## 2017-05-02 DIAGNOSIS — E1140 Type 2 diabetes mellitus with diabetic neuropathy, unspecified: Secondary | ICD-10-CM | POA: Diagnosis not present

## 2017-05-02 DIAGNOSIS — I10 Essential (primary) hypertension: Secondary | ICD-10-CM | POA: Diagnosis not present

## 2017-05-02 DIAGNOSIS — E785 Hyperlipidemia, unspecified: Secondary | ICD-10-CM | POA: Diagnosis not present

## 2017-05-02 DIAGNOSIS — I2510 Atherosclerotic heart disease of native coronary artery without angina pectoris: Secondary | ICD-10-CM | POA: Diagnosis not present

## 2017-05-02 DIAGNOSIS — Z7901 Long term (current) use of anticoagulants: Secondary | ICD-10-CM | POA: Diagnosis not present

## 2017-05-02 DIAGNOSIS — Z79899 Other long term (current) drug therapy: Secondary | ICD-10-CM | POA: Diagnosis not present

## 2017-05-06 DIAGNOSIS — Z95810 Presence of automatic (implantable) cardiac defibrillator: Secondary | ICD-10-CM | POA: Diagnosis not present

## 2017-05-10 DIAGNOSIS — R791 Abnormal coagulation profile: Secondary | ICD-10-CM | POA: Diagnosis not present

## 2017-05-13 DIAGNOSIS — N644 Mastodynia: Secondary | ICD-10-CM | POA: Diagnosis not present

## 2017-05-16 DIAGNOSIS — R928 Other abnormal and inconclusive findings on diagnostic imaging of breast: Secondary | ICD-10-CM | POA: Diagnosis not present

## 2017-05-16 DIAGNOSIS — N644 Mastodynia: Secondary | ICD-10-CM | POA: Diagnosis not present

## 2017-05-24 DIAGNOSIS — R791 Abnormal coagulation profile: Secondary | ICD-10-CM | POA: Diagnosis not present

## 2017-05-29 ENCOUNTER — Ambulatory Visit (INDEPENDENT_AMBULATORY_CARE_PROVIDER_SITE_OTHER): Payer: Medicare Other | Admitting: Cardiology

## 2017-05-29 ENCOUNTER — Other Ambulatory Visit: Payer: Self-pay | Admitting: Cardiology

## 2017-05-29 VITALS — Ht 71.0 in | Wt 338.0 lb

## 2017-05-29 DIAGNOSIS — I10 Essential (primary) hypertension: Secondary | ICD-10-CM

## 2017-05-29 DIAGNOSIS — I5023 Acute on chronic systolic (congestive) heart failure: Secondary | ICD-10-CM | POA: Diagnosis not present

## 2017-05-29 DIAGNOSIS — I6523 Occlusion and stenosis of bilateral carotid arteries: Secondary | ICD-10-CM

## 2017-05-29 DIAGNOSIS — Z952 Presence of prosthetic heart valve: Secondary | ICD-10-CM

## 2017-05-29 DIAGNOSIS — R0609 Other forms of dyspnea: Secondary | ICD-10-CM | POA: Diagnosis not present

## 2017-05-29 DIAGNOSIS — I480 Paroxysmal atrial fibrillation: Secondary | ICD-10-CM

## 2017-05-29 DIAGNOSIS — I5022 Chronic systolic (congestive) heart failure: Secondary | ICD-10-CM | POA: Diagnosis not present

## 2017-05-29 DIAGNOSIS — I420 Dilated cardiomyopathy: Secondary | ICD-10-CM | POA: Diagnosis not present

## 2017-05-29 DIAGNOSIS — I2510 Atherosclerotic heart disease of native coronary artery without angina pectoris: Secondary | ICD-10-CM | POA: Diagnosis not present

## 2017-05-30 NOTE — Patient Instructions (Signed)
Medication Instructions:  Your physician recommends that you continue on your current medications as directed. Please refer to the Current Medication list given to you today.  Labwork: Your physician recommends that you have labs today in office: BMP and Pro-BNP  Testing/Procedures: None   Follow-Up: Your physician recommends that you schedule a follow-up appointment in:   Any Other Special Instructions Will Be Listed Below (If Applicable).  Please note that any paperwork needing to be filled out by the provider will need to be addressed at the front desk prior to seeing the provider. Please note that any paperwork FMLA, Disability or other documents regarding health condition is subject to a $25.00 charge that must be received prior to completion of paperwork.     If you need a refill on your cardiac medications before your next appointment, please call your pharmacy.

## 2017-05-30 NOTE — Progress Notes (Signed)
Cardiology Office Note:    Date:  05/30/2017   ID:  Shannon Baxter, DOB Jul 15, 1961, MRN [id number]  PCP:  Paulina Fusi, MD  Cardiologist:  Gypsy Balsam, MD    Referring MD: Paulina Fusi, MD   No chief complaint on file. Chief complaint is I am weak and tired and having some shortness of breath  History of Present Illness:    Shannon Baxter is a 56 y.o. female  with history of coronary myopathy, proximal mitral fibrillation, last was aortic valve replacement. From last 3-4 weeks complaining of being tired and exhausted. She also noted some swelling of lower extremities. There is also some worsening of shortness of breath with exertion. Denies having any chest pain no palpitations. Weight is stable.  Past Medical History:  Diagnosis Date  . Allergy   . Aortic insufficiency   . Aortic stenosis   . Arthritis   . Asthma   . CHF (congestive heart failure) (HCC)   . Claudication (HCC)   . Coronary artery disease   . Diabetes mellitus without complication (HCC)   . GERD (gastroesophageal reflux disease)   . Heart murmur   . Hyperlipidemia   . Hypertension   . Nutritional and metabolic cardiomyopathy (HCC)   . PVD (peripheral vascular disease) (HCC)   . Sleep apnea   . Stroke [facility])     Past Surgical History:  Procedure Laterality Date  . CARDIAC CATHETERIZATION    . INSERT / REPLACE / REMOVE PACEMAKER     Medtronic ICD  . MECHANICAL AORTIC VALVE REPLACEMENT    . PERIPHERAL ARTERIAL STENT GRAFT    . TUBAL LIGATION      Current Medications: No outpatient prescriptions have been marked as taking for the 05/29/17 encounter (Office Visit) with Georgeanna Lea, MD.     Allergies:   Ioxaglate and Ivp dye [iodinated diagnostic agents]   Social History   Social History  . Marital status: Single    Spouse name: N/A  . Number of children: N/A  . Years of education: N/A   Social History Main Topics  . Smoking status: Former Games developer  . Smokeless tobacco: Never Used    . Alcohol use No  . Drug use: No  . Sexual activity: Not on file   Other Topics Concern  . Not on file   Social History Narrative  . No narrative on file     Family History: The patient's family history includes Heart disease in her brother. ROS:   Please see the history of present illness.    All 14 point review of systems negative except as described per history of present illness  EKGs/Labs/Other Studies Reviewed:      Recent Labs: No results found for requested labs within last 8760 hours.  Recent Lipid Panel    Component Value Date/Time   CHOL  07/04/2007 0815    200        ATP III CLASSIFICATION:  <200     mg/dL   Desirable  169-678  mg/dL   Borderline High  >=938    mg/dL   High   TRIG 101 75/07/2584 0815   HDL 47 07/04/2007 0815   CHOLHDL 4.3 07/04/2007 0815   VLDL 28 07/04/2007 0815   LDLCALC (H) 07/04/2007 0815    125        Total Cholesterol/HDL:CHD Risk Coronary Heart Disease Risk Table  Men   Women  1/2 Average Risk   3.4   3.3    Physical Exam:    VS:  There were no vitals taken for this visit.    Wt Readings from Last 3 Encounters:  No data found for Wt     GEN:  Well nourished, well developed in no acute distress HEENT: Normal NECK: No JVD; No carotid bruits LYMPHATICS: No lymphadenopathy CARDIAC: RRRCrisp mechanical valve sounds., no murmurs, no rubs, no gallops RESPIRATORY:  Clear to auscultation without rales, wheezing or rhonchi  ABDOMEN: Soft, non-tender, non-distended MUSCULOSKELETAL:  No edema; No deformity  SKIN: Warm and dry LOWER EXTREMITIES: no swelling NEUROLOGIC:  Alert and oriented x 3 PSYCHIATRIC:  Normal affect   ASSESSMENT:     PLAN:    In order of problems listed above:  1. Dyspnea on exertion: I did interrogated her device today luckily Optivol seems to be fine. I will ask him to have proBNP as well as Chem-7 done. 2. Status post aortic valve replacement: Valve sounds good. I will retrieve  echocardiogram done recently in [facility]. 3. Dyslipidemia: I will continue with statin. He followed by primary care physician. 4. Coronary myopathy: I will call [facility] to get report of her last echocardiogram. 5. Peripheral vascular disease: She did have carotic ultrasound done in [place] we'll retrieve the data.   Medication Adjustments/Labs and Tests Ordered: Current medicines are reviewed at length with the patient today.  Concerns regarding medicines are outlined above.  Orders Placed This Encounter  Procedures  . Pro b natriuretic peptide (BNP)  . Basic metabolic panel   Medication changes: No orders of the defined types were placed in this encounter.   Signed, Georgeanna Lea, MD, [facility] 05/30/2017 12:55 PM    [facility]

## 2017-05-31 DIAGNOSIS — R791 Abnormal coagulation profile: Secondary | ICD-10-CM | POA: Diagnosis not present

## 2017-05-31 LAB — BASIC METABOLIC PANEL
BUN/Creatinine Ratio: 14 (ref 9–23)
BUN: 12 mg/dL (ref 6–24)
CALCIUM: 9.5 mg/dL (ref 8.7–10.2)
CO2: 26 mmol/L (ref 20–29)
CREATININE: 0.87 mg/dL (ref 0.57–1.00)
Chloride: 105 mmol/L (ref 96–106)
GFR calc Af Amer: 87 mL/min/{1.73_m2} (ref >59)
GFR calc non Af Amer: 75 mL/min/{1.73_m2} (ref >59)
GLUCOSE: 68 mg/dL (ref 65–99)
Potassium: 4.1 mmol/L (ref 3.5–5.2)
Sodium: 143 mmol/L (ref 134–144)

## 2017-06-01 LAB — PRO B NATRIURETIC PEPTIDE: NT-PRO BNP: 860 pg/mL — AB (ref 0–287)

## 2017-06-01 LAB — SPECIMEN STATUS REPORT

## 2017-06-07 DIAGNOSIS — R791 Abnormal coagulation profile: Secondary | ICD-10-CM | POA: Diagnosis not present

## 2017-06-17 DIAGNOSIS — L03115 Cellulitis of right lower limb: Secondary | ICD-10-CM | POA: Diagnosis not present

## 2017-06-17 DIAGNOSIS — R600 Localized edema: Secondary | ICD-10-CM | POA: Diagnosis not present

## 2017-06-18 DIAGNOSIS — L03115 Cellulitis of right lower limb: Secondary | ICD-10-CM | POA: Diagnosis not present

## 2017-06-18 DIAGNOSIS — L02415 Cutaneous abscess of right lower limb: Secondary | ICD-10-CM | POA: Diagnosis not present

## 2017-06-18 DIAGNOSIS — M7989 Other specified soft tissue disorders: Secondary | ICD-10-CM | POA: Diagnosis not present

## 2017-06-18 DIAGNOSIS — Z79899 Other long term (current) drug therapy: Secondary | ICD-10-CM | POA: Diagnosis not present

## 2017-06-21 DIAGNOSIS — R791 Abnormal coagulation profile: Secondary | ICD-10-CM | POA: Diagnosis not present

## 2017-06-27 DIAGNOSIS — G4733 Obstructive sleep apnea (adult) (pediatric): Secondary | ICD-10-CM | POA: Diagnosis not present

## 2017-07-01 ENCOUNTER — Encounter: Payer: Self-pay | Admitting: Cardiology

## 2017-07-01 DIAGNOSIS — J019 Acute sinusitis, unspecified: Secondary | ICD-10-CM | POA: Diagnosis not present

## 2017-07-01 DIAGNOSIS — J208 Acute bronchitis due to other specified organisms: Secondary | ICD-10-CM | POA: Diagnosis not present

## 2017-07-05 DIAGNOSIS — R791 Abnormal coagulation profile: Secondary | ICD-10-CM | POA: Diagnosis not present

## 2017-07-12 DIAGNOSIS — R791 Abnormal coagulation profile: Secondary | ICD-10-CM | POA: Diagnosis not present

## 2017-07-15 DIAGNOSIS — E119 Type 2 diabetes mellitus without complications: Secondary | ICD-10-CM | POA: Diagnosis not present

## 2017-07-26 DIAGNOSIS — R791 Abnormal coagulation profile: Secondary | ICD-10-CM | POA: Diagnosis not present

## 2017-08-06 DIAGNOSIS — I2510 Atherosclerotic heart disease of native coronary artery without angina pectoris: Secondary | ICD-10-CM | POA: Diagnosis not present

## 2017-08-06 DIAGNOSIS — E1140 Type 2 diabetes mellitus with diabetic neuropathy, unspecified: Secondary | ICD-10-CM | POA: Diagnosis not present

## 2017-08-06 DIAGNOSIS — E785 Hyperlipidemia, unspecified: Secondary | ICD-10-CM | POA: Diagnosis not present

## 2017-08-06 DIAGNOSIS — I10 Essential (primary) hypertension: Secondary | ICD-10-CM | POA: Diagnosis not present

## 2017-08-07 ENCOUNTER — Ambulatory Visit (INDEPENDENT_AMBULATORY_CARE_PROVIDER_SITE_OTHER): Payer: Medicare Other | Admitting: Sports Medicine

## 2017-08-07 ENCOUNTER — Encounter: Payer: Self-pay | Admitting: Sports Medicine

## 2017-08-07 ENCOUNTER — Ambulatory Visit (INDEPENDENT_AMBULATORY_CARE_PROVIDER_SITE_OTHER): Payer: Medicare Other

## 2017-08-07 DIAGNOSIS — M778 Other enthesopathies, not elsewhere classified: Secondary | ICD-10-CM

## 2017-08-07 DIAGNOSIS — M7751 Other enthesopathy of right foot: Secondary | ICD-10-CM

## 2017-08-07 DIAGNOSIS — I739 Peripheral vascular disease, unspecified: Secondary | ICD-10-CM

## 2017-08-07 DIAGNOSIS — E1140 Type 2 diabetes mellitus with diabetic neuropathy, unspecified: Secondary | ICD-10-CM

## 2017-08-07 DIAGNOSIS — M722 Plantar fascial fibromatosis: Secondary | ICD-10-CM | POA: Diagnosis not present

## 2017-08-07 DIAGNOSIS — M79672 Pain in left foot: Secondary | ICD-10-CM

## 2017-08-07 DIAGNOSIS — M779 Enthesopathy, unspecified: Secondary | ICD-10-CM

## 2017-08-07 DIAGNOSIS — M7752 Other enthesopathy of left foot: Secondary | ICD-10-CM | POA: Diagnosis not present

## 2017-08-07 DIAGNOSIS — E1165 Type 2 diabetes mellitus with hyperglycemia: Secondary | ICD-10-CM

## 2017-08-07 DIAGNOSIS — IMO0002 Reserved for concepts with insufficient information to code with codable children: Secondary | ICD-10-CM

## 2017-08-07 DIAGNOSIS — M79671 Pain in right foot: Secondary | ICD-10-CM

## 2017-08-07 NOTE — Patient Instructions (Signed)

## 2017-08-07 NOTE — Progress Notes (Signed)
Patient ID: Shannon Baxter, female   DOB: 06/10/1961, 56 y.o.   MRN: [id number] Subjective: Shannon Baxter is a 56 y.o. female patient with history of type 2 diabetes and PVD with claudication who presents to office today complaining pain in both feet. Patient states that pain is at the top of both feet and along the arches for the last 2 weeks. Denies any trauma or injury or any causative factors. Reports that she has tried changing her shoe has helped a little bit states that the pain is a shooting pain that comes and goes. However, its bothersome, especially when she is standing or walking for long periods of time. Patient reports that since her last office visit. She has had 2 other vascular procedures where she has had her heart valve replaced and a ICD device placed. Denies any other pedal complaints.   Fasting blood sugar per patient has been good.  Patient Active Problem List   Diagnosis Date Noted  . Chronic systolic congestive heart failure (HCC) 12/06/2016  . Morbid obesity (HCC) 12/06/2016  . Presence of automatic implantable cardioverter-defibrillator 10/23/2016  . Dilated cardiomyopathy (HCC) 09/03/2016  . History of mechanical aortic valve replacement 03/29/2016  . Paroxysmal atrial fibrillation (HCC) 03/06/2016  . Rheumatic aortic valve insufficiency 03/05/2016  . Acute on chronic systolic heart failure (HCC) 02/21/2016  . Hypothyroidism 01/23/2016  . Aortic valve stenosis, rheumatic 11/22/2015  . Type 2 diabetes mellitus with diabetic peripheral angiopathy without gangrene, without long-term current use of insulin (HCC) 11/22/2015  . Dyspnea on exertion 10/26/2015  . Atherosclerosis of native arteries of extremities with intermittent claudication, bilateral legs (HCC) 08/04/2015  . Carotid atherosclerosis 07/26/2015  . Coronary artery disease involving native coronary artery of native heart without angina pectoris 07/26/2015  . Dyslipidemia 07/26/2015  . Essential hypertension  07/26/2015  . Intermittent claudication (HCC) 07/26/2015  . Peripheral vascular disease (HCC) 07/26/2015   Current Outpatient Prescriptions on File Prior to Visit  Medication Sig Dispense Refill  . acetaminophen (TYLENOL) 500 MG tablet Take 1,500 mg by mouth 2 (two) times daily as needed for pain.    Marland Kitchen. albuterol (PROVENTIL) (2.5 MG/3ML) 0.083% nebulizer solution Inhale 2.5 mg into the lungs 2 (two) times daily.    Marland Kitchen. ALPRAZolam (XANAX) 0.5 MG tablet 1 tablet nightly as needed for sleep  0  . atorvastatin (LIPITOR) 80 MG tablet Take 80 mg by mouth daily.    . clopidogrel (PLAVIX) 75 MG tablet Take 75 mg by mouth daily.  6  . Cyanocobalamin (B-12) 500 MCG TABS Take 3 tablets by mouth daily.    Marland Kitchen. diltiazem (CARDIZEM CD) 240 MG 24 hr capsule Take 240 mg by mouth daily.    Marland Kitchen. EPIPEN 2-PAK 0.3 MG/0.3ML SOAJ injection See admin instructions.  0  . fluticasone (FLONASE) 50 MCG/ACT nasal spray Place 2 sprays into both nostrils daily.     . furosemide (LASIX) 20 MG tablet Take 20 mg by mouth daily.     Marland Kitchen. gabapentin (NEURONTIN) 300 MG capsule 1 (ONE) CAPSULE BY MOUTH THREE TIMES DAILY  5  . glimepiride (AMARYL) 4 MG tablet Take 4 mg by mouth daily.    . hydrALAZINE (APRESOLINE) 10 MG tablet Take 10 mg by mouth 3 (three) times daily.    Marland Kitchen. ipratropium-albuterol (DUONEB) 0.5-2.5 (3) MG/3ML SOLN     . loratadine (CLARITIN) 10 MG tablet Take 10 mg by mouth daily.    . metFORMIN (GLUCOPHAGE) 500 MG tablet Take by mouth 2 (two) times daily with a  meal.    . metoprolol tartrate (LOPRESSOR) 25 MG tablet Take 25 mg by mouth 2 (two) times daily.    . mometasone (ASMANEX) 220 MCG/INH inhaler Inhale 2 puffs into the lungs daily.    . montelukast (SINGULAIR) 10 MG tablet Take 10 mg by mouth daily.    . Multiple Vitamins-Minerals (CENTRUM SILVER ULTRA WOMENS) TABS Take 1 tablet by mouth daily.    . nitroGLYCERIN (NITROSTAT) 0.4 MG SL tablet Place 0.4 mg under the tongue every 5 (five) minutes as needed for chest pain.     Marland Kitchen omeprazole (PRILOSEC) 20 MG capsule     . Red Yeast Rice 600 MG TABS Take 2 tablets by mouth daily.    Marland Kitchen warfarin (COUMADIN) 1 MG tablet Take 1 mg by mouth as directed.    . warfarin (COUMADIN) 5 MG tablet Take 5 mg by mouth as directed.     No current facility-administered medications on file prior to visit.    Allergies  Allergen Reactions  . Ioxaglate Hives  . Ivp Dye [Iodinated Diagnostic Agents]     Objective: General: Patient is awake, alert, and oriented x 3 and in no acute distress.  Integument: Skin is warm, dry and supple bilateral. Nails x10 are well manicured/polished (patient gets pedicures), short, and thickened with no signs of infection. No open lesions or preulcerative lesions present bilateral. Remaining integument unremarkable.  Vasculature:  Dorsalis Pedis pulse 1/4 bilateral. Posterior Tibial pulse  0/4 bilateral.  Capillary fill time <4 sec 1-5 bilateral. No hair growth to the level of the digits. Temperature gradient decreased. No varicosities present bilateral. No acute ischemic changes or gangrene. No edema present bilateral.   Neurology: The patient has intact sensation measured with a 5.07/10g Semmes Weinstein Monofilament at all pedal sites bilateral . Vibratory sensation diminished bilateral with tuning fork. No Babinski sign present bilateral.   Musculoskeletal:There is mild tenderness to palpation to the dorsal midfoot at the tarsometatarsal joints bilateral with mild palpable bony prominences and pain at the medial longitudinal arches bilaterally. Pes planus foot type noted bilateral. Muscular strength 5/5 in all lower extremity muscular groups bilateral without pain or limitation on range of motion . No tenderness with calf compression bilateral.  X-rays right and left foot normal osseous mineralization, there is mild. Midtarsal joint space narrowing and dorsal bone spurs. No fracture or dislocation. No other acute findings.  Assessment and  Plan: Problem List Items Addressed This Visit    None    Visit Diagnoses    Plantar fasciitis    -  Primary   Relevant Orders   DG Foot Complete Right   DG Foot Complete Left   Capsulitis of right foot       Capsulitis of left foot       Foot pain, bilateral       Type 2 diabetes, uncontrolled, with neuropathy (HCC)       PVD (peripheral vascular disease) with claudication (HCC)         -Examined patient. -Discussed and educated patient on diabetic foot care, especially with  regards to the vascular, neurological and musculoskeletal systems.  -Stressed the importance of good glycemic control and the detriment of not  controlling glucose levels in relation to the foot.  -After oral consent and aseptic prep, injected a mixture containing 1 ml of 2%  plain lidocaine, 1 ml 0.5% plain marcaine, 0.5 ml of kenalog 10 and 0.5 ml of dexamethasone phosphate into left and right Midtarsal joints without complication. Post-injection  care discussed with patient.  -Applied plantar fascial strapping bilateral to help alleviate arch pain to keep clean, dry and intact for 5 days. Advised patient that if this taping works well for her. Will benefit from over-the-counter orthotics -Recommend continue with good supportive shoes daily -Recommend continued close vascular follow up with [facility] -Answered all patient questions -Patient to return in 6 weeks for follow-up evaluation of foot pain or sooner if problems or issues arise  [name], DPM

## 2017-08-09 DIAGNOSIS — Z79899 Other long term (current) drug therapy: Secondary | ICD-10-CM | POA: Diagnosis not present

## 2017-08-09 DIAGNOSIS — E785 Hyperlipidemia, unspecified: Secondary | ICD-10-CM | POA: Diagnosis not present

## 2017-08-09 DIAGNOSIS — R791 Abnormal coagulation profile: Secondary | ICD-10-CM | POA: Diagnosis not present

## 2017-08-09 DIAGNOSIS — E1140 Type 2 diabetes mellitus with diabetic neuropathy, unspecified: Secondary | ICD-10-CM | POA: Diagnosis not present

## 2017-08-21 DIAGNOSIS — M25561 Pain in right knee: Secondary | ICD-10-CM | POA: Diagnosis not present

## 2017-09-02 DIAGNOSIS — G4733 Obstructive sleep apnea (adult) (pediatric): Secondary | ICD-10-CM | POA: Diagnosis not present

## 2017-09-02 DIAGNOSIS — J4530 Mild persistent asthma, uncomplicated: Secondary | ICD-10-CM | POA: Diagnosis not present

## 2017-09-02 DIAGNOSIS — R5383 Other fatigue: Secondary | ICD-10-CM | POA: Diagnosis not present

## 2017-09-09 DIAGNOSIS — Z7901 Long term (current) use of anticoagulants: Secondary | ICD-10-CM | POA: Diagnosis not present

## 2017-09-15 DIAGNOSIS — G4733 Obstructive sleep apnea (adult) (pediatric): Secondary | ICD-10-CM | POA: Diagnosis not present

## 2017-09-16 DIAGNOSIS — R791 Abnormal coagulation profile: Secondary | ICD-10-CM | POA: Diagnosis not present

## 2017-09-19 ENCOUNTER — Ambulatory Visit: Payer: Medicaid Other | Admitting: Sports Medicine

## 2017-09-24 DIAGNOSIS — J208 Acute bronchitis due to other specified organisms: Secondary | ICD-10-CM | POA: Diagnosis not present

## 2017-10-02 ENCOUNTER — Ambulatory Visit (INDEPENDENT_AMBULATORY_CARE_PROVIDER_SITE_OTHER): Payer: Medicare Other | Admitting: Sports Medicine

## 2017-10-02 ENCOUNTER — Encounter: Payer: Self-pay | Admitting: Sports Medicine

## 2017-10-02 DIAGNOSIS — E1140 Type 2 diabetes mellitus with diabetic neuropathy, unspecified: Secondary | ICD-10-CM

## 2017-10-02 DIAGNOSIS — M79675 Pain in left toe(s): Secondary | ICD-10-CM

## 2017-10-02 DIAGNOSIS — E1165 Type 2 diabetes mellitus with hyperglycemia: Secondary | ICD-10-CM | POA: Diagnosis not present

## 2017-10-02 DIAGNOSIS — M79674 Pain in right toe(s): Secondary | ICD-10-CM

## 2017-10-02 DIAGNOSIS — M79672 Pain in left foot: Secondary | ICD-10-CM

## 2017-10-02 DIAGNOSIS — IMO0002 Reserved for concepts with insufficient information to code with codable children: Secondary | ICD-10-CM

## 2017-10-02 DIAGNOSIS — J209 Acute bronchitis, unspecified: Secondary | ICD-10-CM | POA: Diagnosis not present

## 2017-10-02 DIAGNOSIS — M79671 Pain in right foot: Secondary | ICD-10-CM

## 2017-10-02 DIAGNOSIS — B351 Tinea unguium: Secondary | ICD-10-CM | POA: Diagnosis not present

## 2017-10-02 DIAGNOSIS — I6789 Other cerebrovascular disease: Secondary | ICD-10-CM | POA: Diagnosis not present

## 2017-10-02 DIAGNOSIS — G4733 Obstructive sleep apnea (adult) (pediatric): Secondary | ICD-10-CM | POA: Diagnosis not present

## 2017-10-02 NOTE — Progress Notes (Addendum)
Subjective: Shannon Baxter is a 56 y.o. female patient with history of diabetes who presents to office today complaining of long, painful nails  while ambulating in shoes; unable to trim. Patient states that the glucose reading this morning was 110 mg/dl. Patient denies any new changes in medication or new problems. Patient denies any new cramping, numbness, burning or tingling in the legs.  Saw PCP last week.  Patient Active Problem List   Diagnosis Date Noted  . Chronic systolic congestive heart failure (HCC) 12/06/2016  . Morbid obesity (HCC) 12/06/2016  . Presence of automatic implantable cardioverter-defibrillator 10/23/2016  . Dilated cardiomyopathy (HCC) 09/03/2016  . History of mechanical aortic valve replacement 03/29/2016  . Paroxysmal atrial fibrillation (HCC) 03/06/2016  . Rheumatic aortic valve insufficiency 03/05/2016  . Acute on chronic systolic heart failure (HCC) 02/21/2016  . Hypothyroidism 01/23/2016  . Aortic valve stenosis, rheumatic 11/22/2015  . Type 2 diabetes mellitus with diabetic peripheral angiopathy without gangrene, without long-term current use of insulin (HCC) 11/22/2015  . Dyspnea on exertion 10/26/2015  . Atherosclerosis of native arteries of extremities with intermittent claudication, bilateral legs (HCC) 08/04/2015  . Carotid atherosclerosis 07/26/2015  . Coronary artery disease involving native coronary artery of native heart without angina pectoris 07/26/2015  . Dyslipidemia 07/26/2015  . Essential hypertension 07/26/2015  . Intermittent claudication (HCC) 07/26/2015  . Peripheral vascular disease (HCC) 07/26/2015   Current Outpatient Medications on File Prior to Visit  Medication Sig Dispense Refill  . acetaminophen (TYLENOL) 500 MG tablet Take 1,500 mg by mouth 2 (two) times daily as needed for pain.    Marland Kitchen albuterol (PROVENTIL) (2.5 MG/3ML) 0.083% nebulizer solution Inhale 2.5 mg into the lungs 2 (two) times daily.    Marland Kitchen ALPRAZolam (XANAX) 0.5 MG  tablet 1 tablet nightly as needed for sleep  0  . atorvastatin (LIPITOR) 80 MG tablet Take 80 mg by mouth daily.    . clopidogrel (PLAVIX) 75 MG tablet Take 75 mg by mouth daily.  6  . Cyanocobalamin (B-12) 500 MCG TABS Take 3 tablets by mouth daily.    Marland Kitchen diltiazem (CARDIZEM CD) 240 MG 24 hr capsule Take 240 mg by mouth daily.    Marland Kitchen EPIPEN 2-PAK 0.3 MG/0.3ML SOAJ injection See admin instructions.  0  . fluticasone (FLONASE) 50 MCG/ACT nasal spray Place 2 sprays into both nostrils daily.     . furosemide (LASIX) 20 MG tablet Take 20 mg by mouth daily.     Marland Kitchen gabapentin (NEURONTIN) 300 MG capsule 1 (ONE) CAPSULE BY MOUTH THREE TIMES DAILY  5  . glimepiride (AMARYL) 4 MG tablet Take 4 mg by mouth daily.    . hydrALAZINE (APRESOLINE) 10 MG tablet Take 10 mg by mouth 3 (three) times daily.    Marland Kitchen ipratropium-albuterol (DUONEB) 0.5-2.5 (3) MG/3ML SOLN     . loratadine (CLARITIN) 10 MG tablet Take 10 mg by mouth daily.    . metFORMIN (GLUCOPHAGE) 500 MG tablet Take by mouth 2 (two) times daily with a meal.    . metoprolol tartrate (LOPRESSOR) 25 MG tablet Take 25 mg by mouth 2 (two) times daily.    . mometasone (ASMANEX) 220 MCG/INH inhaler Inhale 2 puffs into the lungs daily.    . montelukast (SINGULAIR) 10 MG tablet Take 10 mg by mouth daily.    . Multiple Vitamins-Minerals (CENTRUM SILVER ULTRA WOMENS) TABS Take 1 tablet by mouth daily.    . nitroGLYCERIN (NITROSTAT) 0.4 MG SL tablet Place 0.4 mg under the tongue every 5 (five)  minutes as needed for chest pain.    Marland Kitchen. omeprazole (PRILOSEC) 20 MG capsule     . Red Yeast Rice 600 MG TABS Take 2 tablets by mouth daily.    Marland Kitchen. warfarin (COUMADIN) 1 MG tablet Take 1 mg by mouth as directed.    . warfarin (COUMADIN) 5 MG tablet Take 5 mg by mouth as directed.     No current facility-administered medications on file prior to visit.    Allergies  Allergen Reactions  . Ioxaglate Hives  . Ivp Dye [Iodinated Diagnostic Agents]     No results found for this  or any previous visit (from the past 2160 hour(s)).  Objective: General: Patient is awake, alert, and oriented x 3 and in no acute distress.  Integument: Skin is warm, dry and supple bilateral. Nails are tender, long, thickened and  dystrophic with subungual debris, consistent with onychomycosis, 1-5 bilateral. No signs of infection. No open lesions or preulcerative lesions present bilateral. Remaining integument unremarkable.  Vasculature:  Dorsalis Pedis pulse 1/4 bilateral. Posterior Tibial pulse  0/4 bilateral.  Capillary fill time <3 sec 1-5 bilateral. Positive hair growth to the level of the digits. Temperature gradient within normal limits. No varicosities present bilateral. No edema present bilateral.   Neurology: The patient has intact sensation measured with a 5.07/10g Semmes Weinstein Monofilament at all pedal sites bilateral . Vibratory sensation diminished bilateral with tuning fork. No Babinski sign present bilateral.   Musculoskeletal:Asymptomatic pes planus pedal deformities noted bilateral. Muscular strength 5/5 in all lower extremity muscular groups bilateral without pain on range of motion except at midfoot where there is arthritis. No tenderness with calf compression bilateral.  Assessment and Plan: Problem List Items Addressed This Visit    None    Visit Diagnoses    Pain due to onychomycosis of toenails of both feet    -  Primary   Type 2 diabetes, uncontrolled, with neuropathy (HCC)       Foot pain, bilateral          -Examined patient. -Discussed and educated patient on diabetic foot care, especially with  regards to the vascular, neurological and musculoskeletal systems.  -Stressed the importance of good glycemic control and the detriment of not controlling glucose levels in relation to the foot. -Mechanically debrided all nails 1-5 bilateral using sterile nail nipper and filed with dremel without incident  -Answered all patient questions -Patient to return   in 2.5 to 3 months for at risk foot care -Patient advised to call the office if any problems or questions arise in the meantime.  Asencion Islamitorya Anwen Cannedy, DPM

## 2017-10-03 DIAGNOSIS — R791 Abnormal coagulation profile: Secondary | ICD-10-CM | POA: Diagnosis not present

## 2017-10-10 DIAGNOSIS — R791 Abnormal coagulation profile: Secondary | ICD-10-CM | POA: Diagnosis not present

## 2017-10-17 DIAGNOSIS — R791 Abnormal coagulation profile: Secondary | ICD-10-CM | POA: Diagnosis not present

## 2017-10-31 DIAGNOSIS — R791 Abnormal coagulation profile: Secondary | ICD-10-CM | POA: Diagnosis not present

## 2017-11-14 ENCOUNTER — Encounter: Payer: Self-pay | Admitting: Cardiology

## 2017-11-14 ENCOUNTER — Ambulatory Visit (INDEPENDENT_AMBULATORY_CARE_PROVIDER_SITE_OTHER): Payer: Medicare Other | Admitting: Cardiology

## 2017-11-14 VITALS — BP 110/70 | HR 87 | Ht 71.0 in | Wt 362.0 lb

## 2017-11-14 DIAGNOSIS — Z95810 Presence of automatic (implantable) cardiac defibrillator: Secondary | ICD-10-CM

## 2017-11-14 DIAGNOSIS — I480 Paroxysmal atrial fibrillation: Secondary | ICD-10-CM | POA: Diagnosis not present

## 2017-11-14 DIAGNOSIS — I5022 Chronic systolic (congestive) heart failure: Secondary | ICD-10-CM

## 2017-11-14 DIAGNOSIS — E1151 Type 2 diabetes mellitus with diabetic peripheral angiopathy without gangrene: Secondary | ICD-10-CM

## 2017-11-14 DIAGNOSIS — I10 Essential (primary) hypertension: Secondary | ICD-10-CM | POA: Diagnosis not present

## 2017-11-14 NOTE — Progress Notes (Signed)
Cardiology Office Note:    Date:  11/14/2017   ID:  Shannon Baxter, DOB 04/18/1961, MRN [id number]  PCP:  Paulina Fusi, MD  Cardiologist:  Gypsy Balsam, MD    Referring MD: Paulina Fusi, MD   Chief Complaint  Patient presents with  . Follow-up  Having swelling of lower extremities and shortness of breath  History of Present Illness:    Shannon Baxter is a 57 y.o. female with status post aortic valve replacement, cardiomyopathy with moderately diminished left ventricular ejection fraction, ICD is present.  Diabetes hypertension.  She comes for regular follow-up after not being in our office for almost 6 months.  Described to have some swelling of lower extremities worse at evening time.  Also has to get up many times during the night to urinate.  There is no proximal nocturnal dyspnea but does have some exertional shortness of breath.  No discharges from the defibrillator no palpitations no dizziness no passing out  Past Medical History:  Diagnosis Date  . Allergy   . Aortic insufficiency   . Aortic stenosis   . Arthritis   . Asthma   . CHF (congestive heart failure) (HCC)   . Claudication (HCC)   . Coronary artery disease   . Diabetes mellitus without complication (HCC)   . GERD (gastroesophageal reflux disease)   . Heart murmur   . Hyperlipidemia   . Hypertension   . Nutritional and metabolic cardiomyopathy (HCC)   . PVD (peripheral vascular disease) (HCC)   . Sleep apnea   . Stroke [facility])     Past Surgical History:  Procedure Laterality Date  . CARDIAC CATHETERIZATION    . INSERT / REPLACE / REMOVE PACEMAKER     Medtronic ICD  . MECHANICAL AORTIC VALVE REPLACEMENT    . PERIPHERAL ARTERIAL STENT GRAFT    . TUBAL LIGATION      Current Medications: Current Meds  Medication Sig  . acetaminophen (TYLENOL) 500 MG tablet Take 1,500 mg by mouth 2 (two) times daily as needed for pain.  Marland Kitchen albuterol (PROVENTIL) (2.5 MG/3ML) 0.083% nebulizer solution Inhale 2.5 mg  into the lungs 2 (two) times daily.  Marland Kitchen ALPRAZolam (XANAX) 0.5 MG tablet 1 tablet nightly as needed for sleep  . atorvastatin (LIPITOR) 80 MG tablet Take 80 mg by mouth daily.  . clopidogrel (PLAVIX) 75 MG tablet Take 75 mg by mouth daily.  . Cyanocobalamin (B-12) 500 MCG TABS Take 3 tablets by mouth daily.  Marland Kitchen diltiazem (CARDIZEM CD) 240 MG 24 hr capsule Take 240 mg by mouth daily.  Marland Kitchen EPIPEN 2-PAK 0.3 MG/0.3ML SOAJ injection See admin instructions.  . fluticasone (FLONASE) 50 MCG/ACT nasal spray Place 2 sprays into both nostrils daily.   . furosemide (LASIX) 20 MG tablet Take 20 mg by mouth daily.   Marland Kitchen gabapentin (NEURONTIN) 300 MG capsule 1 (ONE) CAPSULE BY MOUTH THREE TIMES DAILY  . glimepiride (AMARYL) 4 MG tablet Take 4 mg by mouth daily.  . hydrALAZINE (APRESOLINE) 10 MG tablet Take 10 mg by mouth 3 (three) times daily.  Marland Kitchen ipratropium-albuterol (DUONEB) 0.5-2.5 (3) MG/3ML SOLN   . loratadine (CLARITIN) 10 MG tablet Take 10 mg by mouth daily.  . metFORMIN (GLUCOPHAGE) 500 MG tablet Take by mouth 2 (two) times daily with a meal.  . metoprolol tartrate (LOPRESSOR) 25 MG tablet Take 25 mg by mouth 2 (two) times daily.  . mometasone (ASMANEX) 220 MCG/INH inhaler Inhale 2 puffs into the lungs daily.  . montelukast (SINGULAIR) 10  MG tablet Take 10 mg by mouth daily.  . Multiple Vitamins-Minerals (CENTRUM SILVER ULTRA WOMENS) TABS Take 1 tablet by mouth daily.  . nitroGLYCERIN (NITROSTAT) 0.4 MG SL tablet Place 0.4 mg under the tongue every 5 (five) minutes as needed for chest pain.  Marland Kitchen omeprazole (PRILOSEC) 20 MG capsule   . Red Yeast Rice 600 MG TABS Take 2 tablets by mouth daily.  Marland Kitchen warfarin (COUMADIN) 1 MG tablet Take 1 mg by mouth as directed.  . warfarin (COUMADIN) 5 MG tablet Take 5 mg by mouth as directed.     Allergies:   Ioxaglate and Ivp dye [iodinated diagnostic agents]   Social History   Socioeconomic History  . Marital status: Single    Spouse name: None  . Number of  children: None  . Years of education: None  . Highest education level: None  Social Needs  . Financial resource strain: None  . Food insecurity - worry: None  . Food insecurity - inability: None  . Transportation needs - medical: None  . Transportation needs - non-medical: None  Occupational History  . None  Tobacco Use  . Smoking status: Former Games developer  . Smokeless tobacco: Never Used  Substance and Sexual Activity  . Alcohol use: No  . Drug use: No  . Sexual activity: None  Other Topics Concern  . None  Social History Narrative  . None     Family History: The patient's family history includes Heart disease in her brother. ROS:   Please see the history of present illness.    All 14 point review of systems negative except as described per history of present illness  EKGs/Labs/Other Studies Reviewed:      Recent Labs: 05/29/2017: BUN 12; Creatinine, Ser 0.87; NT-Pro BNP 860; Potassium 4.1; Sodium 143  Recent Lipid Panel    Component Value Date/Time   CHOL  07/04/2007 0815    200        ATP III CLASSIFICATION:  <200     mg/dL   Desirable  540-981  mg/dL   Borderline High  >=191    mg/dL   High   TRIG 478 29/56/2130 0815   HDL 47 07/04/2007 0815   CHOLHDL 4.3 07/04/2007 0815   VLDL 28 07/04/2007 0815   LDLCALC (H) 07/04/2007 0815    125        Total Cholesterol/HDL:CHD Risk Coronary Heart Disease Risk Table                     Men   Women  1/2 Average Risk   3.4   3.3    Physical Exam:    VS:  BP 110/70 (BP Location: Left Arm, Patient Position: Sitting, Cuff Size: Large)   Pulse 87   Ht  (1.803 m)   Wt (!) 362 lb (164.2 kg)   SpO2 97%   BMI 50.49 kg/m     Wt Readings from Last 3 Encounters:  11/14/17 (!) 362 lb (164.2 kg)  07/01/17 (!) 338 lb (153.3 kg)     GEN:  Well nourished, well developed in no acute distress HEENT: Normal NECK: No JVD; No carotid bruits LYMPHATICS: No lymphadenopathy CARDIAC: RRR, crisps mechanical valve sounds, soft  systolic murmur grade 1/6 right upper portion of the sternum, no rubs, no gallops RESPIRATORY:  Clear to auscultation without rales, wheezing or rhonchi  ABDOMEN: Soft, non-tender, non-distended MUSCULOSKELETAL:  No edema; No deformity  SKIN: Warm and dry LOWER EXTREMITIES: no swelling NEUROLOGIC:  Alert and oriented x 3 PSYCHIATRIC:  Normal affect   ASSESSMENT:    1. Chronic systolic congestive heart failure (HCC)   2. Essential hypertension   3. Paroxysmal atrial fibrillation (HCC)   4. Type 2 diabetes mellitus with diabetic peripheral angiopathy without gangrene, without long-term current use of insulin (HCC)   5. Presence of automatic implantable cardioverter-defibrillator    PLAN:    In order of problems listed above:  1. Chronic systolic congestive heart failure.  On appropriate medication I will check a Chem-7 and proBNP today she may require increased dose of diuretics to help with the fluid overloaded and some mild on physical exam.  I will also schedule him to have echocardiogram to reassess the valve as well as left ventricular ejection fraction 2. Essential hypertension: Blood pressure well controlled we will continue present medications. 3. Paroxysmal atrial fibrillation: Her heart rate is regular she appears to maintain sinus rhythm.  She is anticoagulated with Coumadin followed by her primary care physician will continue 4. Type 2 diabetes: Doing well her last hemoglobin A1c 6.5.  Follow-up by internal medicine team. 5. ICD present I did review her interrogation from October normal function normal battery status.  Denies having any discharges.   Medication Adjustments/Labs and Tests Ordered: Current medicines are reviewed at length with the patient today.  Concerns regarding medicines are outlined above.  No orders of the defined types were placed in this encounter.  Medication changes: No orders of the defined types were placed in this encounter.   Signed, Georgeanna Lea, MD, [facility] 11/14/2017 1:45 PM    [facility]

## 2017-11-14 NOTE — Patient Instructions (Signed)
Medication Instructions:  Your physician recommends that you continue on your current medications as directed. Please refer to the Current Medication list given to you today.  Labwork: Your physician recommends that you have lab work today: BMP and BNP  Testing/Procedures: Your physician has requested that you have an echocardiogram. Echocardiography is a painless test that uses sound waves to create images of your heart. It provides your doctor with information about the size and shape of your heart and how well your heart's chambers and valves are working. This procedure takes approximately one hour. There are no restrictions for this procedure.  Follow-Up: Your physician recommends that you schedule a follow-up appointment in: 1 month with Dr. Bing Matter   Any Other Special Instructions Will Be Listed Below (If Applicable).     If you need a refill on your cardiac medications before your next appointment, please call your pharmacy.

## 2017-11-15 LAB — BASIC METABOLIC PANEL
BUN/Creatinine Ratio: 14 (ref 9–23)
BUN: 14 mg/dL (ref 6–24)
CALCIUM: 8.9 mg/dL (ref 8.7–10.2)
CO2: 23 mmol/L (ref 20–29)
CREATININE: 1.03 mg/dL — AB (ref 0.57–1.00)
Chloride: 107 mmol/L — ABNORMAL HIGH (ref 96–106)
GFR calc Af Amer: 71 mL/min/{1.73_m2} (ref >59)
GFR calc non Af Amer: 61 mL/min/{1.73_m2} (ref >59)
GLUCOSE: 118 mg/dL — AB (ref 65–99)
Potassium: 4.3 mmol/L (ref 3.5–5.2)
SODIUM: 147 mmol/L — AB (ref 134–144)

## 2017-11-15 LAB — PRO B NATRIURETIC PEPTIDE: NT-PRO BNP: 481 pg/mL — AB (ref 0–287)

## 2017-11-18 NOTE — Addendum Note (Signed)
Addended by: Arville Care on: 11/18/2017 03:57 PM   Modules accepted: Orders

## 2017-11-21 DIAGNOSIS — I340 Nonrheumatic mitral (valve) insufficiency: Secondary | ICD-10-CM | POA: Diagnosis not present

## 2017-11-21 DIAGNOSIS — Z95810 Presence of automatic (implantable) cardiac defibrillator: Secondary | ICD-10-CM | POA: Diagnosis not present

## 2017-11-21 DIAGNOSIS — I5022 Chronic systolic (congestive) heart failure: Secondary | ICD-10-CM | POA: Diagnosis not present

## 2017-11-21 DIAGNOSIS — I480 Paroxysmal atrial fibrillation: Secondary | ICD-10-CM | POA: Diagnosis not present

## 2017-11-21 DIAGNOSIS — I517 Cardiomegaly: Secondary | ICD-10-CM | POA: Diagnosis not present

## 2017-11-29 ENCOUNTER — Telehealth: Payer: Self-pay | Admitting: Cardiology

## 2017-11-29 NOTE — Addendum Note (Signed)
Addended by: Arville Care on: 11/29/2017 04:30 PM   Modules accepted: Orders

## 2017-11-29 NOTE — Telephone Encounter (Signed)
Patient advised of echocardiogram results from [facility]. Dr. Bing Matter states valve looks okay, but ejection fraction has reduced. Advised Dr. Bing Matter would discuss further at appointment scheduled 12/16/17. Patient verbalized understanding. No further questions.

## 2017-11-29 NOTE — Telephone Encounter (Signed)
Patient wanted to speak to a nurse, no information given

## 2017-12-03 DIAGNOSIS — M5416 Radiculopathy, lumbar region: Secondary | ICD-10-CM | POA: Diagnosis not present

## 2017-12-03 DIAGNOSIS — M5442 Lumbago with sciatica, left side: Secondary | ICD-10-CM | POA: Diagnosis not present

## 2017-12-03 DIAGNOSIS — Z952 Presence of prosthetic heart valve: Secondary | ICD-10-CM | POA: Diagnosis not present

## 2017-12-03 DIAGNOSIS — Z7901 Long term (current) use of anticoagulants: Secondary | ICD-10-CM | POA: Diagnosis not present

## 2017-12-03 DIAGNOSIS — M5441 Lumbago with sciatica, right side: Secondary | ICD-10-CM | POA: Diagnosis not present

## 2017-12-09 DIAGNOSIS — R2689 Other abnormalities of gait and mobility: Secondary | ICD-10-CM | POA: Diagnosis not present

## 2017-12-09 DIAGNOSIS — M6281 Muscle weakness (generalized): Secondary | ICD-10-CM | POA: Diagnosis not present

## 2017-12-09 DIAGNOSIS — R2681 Unsteadiness on feet: Secondary | ICD-10-CM | POA: Diagnosis not present

## 2017-12-09 DIAGNOSIS — M545 Low back pain: Secondary | ICD-10-CM | POA: Diagnosis not present

## 2017-12-11 ENCOUNTER — Ambulatory Visit: Payer: Medicare Other | Admitting: Sports Medicine

## 2017-12-11 ENCOUNTER — Encounter: Payer: Self-pay | Admitting: Sports Medicine

## 2017-12-11 ENCOUNTER — Ambulatory Visit (INDEPENDENT_AMBULATORY_CARE_PROVIDER_SITE_OTHER): Payer: Medicare Other | Admitting: Sports Medicine

## 2017-12-11 DIAGNOSIS — IMO0002 Reserved for concepts with insufficient information to code with codable children: Secondary | ICD-10-CM

## 2017-12-11 DIAGNOSIS — M79672 Pain in left foot: Secondary | ICD-10-CM

## 2017-12-11 DIAGNOSIS — E1165 Type 2 diabetes mellitus with hyperglycemia: Secondary | ICD-10-CM

## 2017-12-11 DIAGNOSIS — E1140 Type 2 diabetes mellitus with diabetic neuropathy, unspecified: Secondary | ICD-10-CM

## 2017-12-11 DIAGNOSIS — M79671 Pain in right foot: Secondary | ICD-10-CM

## 2017-12-11 DIAGNOSIS — B351 Tinea unguium: Secondary | ICD-10-CM | POA: Diagnosis not present

## 2017-12-11 DIAGNOSIS — M79675 Pain in left toe(s): Secondary | ICD-10-CM | POA: Diagnosis not present

## 2017-12-11 DIAGNOSIS — M79674 Pain in right toe(s): Secondary | ICD-10-CM | POA: Diagnosis not present

## 2017-12-11 DIAGNOSIS — M722 Plantar fascial fibromatosis: Secondary | ICD-10-CM

## 2017-12-11 NOTE — Progress Notes (Signed)
Subjective: Shannon Baxter is a 57 y.o. female patient with history of diabetes who presents to office today complaining of long, painful nails  while ambulating in shoes; unable to trim. Patient states that the glucose reading this morning was 110 mg/dl. Patient denies any new changes in medication or new problems. Reports occasional pain in arches; states that she is going to have PT for her back next week. Patient denies any new cramping, numbness, burning or tingling in the legs.  Saw PCP last week.  Patient Active Problem List   Diagnosis Date Noted  . Chronic systolic congestive heart failure (HCC) 12/06/2016  . Morbid obesity (HCC) 12/06/2016  . Presence of automatic implantable cardioverter-defibrillator 10/23/2016  . Dilated cardiomyopathy (HCC) 09/03/2016  . Status post aortic valve replacement 03/29/2016  . Paroxysmal atrial fibrillation (HCC) 03/06/2016  . Rheumatic aortic valve insufficiency 03/05/2016  . Acute on chronic systolic heart failure (HCC) 02/21/2016  . Hypothyroidism 01/23/2016  . Aortic valve stenosis, rheumatic 11/22/2015  . Type 2 diabetes mellitus with diabetic peripheral angiopathy without gangrene, without long-term current use of insulin (HCC) 11/22/2015  . Dyspnea on exertion 10/26/2015  . Atherosclerosis of native arteries of extremities with intermittent claudication, bilateral legs (HCC) 08/04/2015  . Carotid atherosclerosis 07/26/2015  . Coronary artery disease involving native coronary artery of native heart without angina pectoris 07/26/2015  . Dyslipidemia 07/26/2015  . Essential hypertension 07/26/2015  . Intermittent claudication (HCC) 07/26/2015  . Peripheral vascular disease (HCC) 07/26/2015   Current Outpatient Medications on File Prior to Visit  Medication Sig Dispense Refill  . acetaminophen (TYLENOL) 500 MG tablet Take 1,500 mg by mouth 2 (two) times daily as needed for pain.    Marland Kitchen albuterol (PROVENTIL) (2.5 MG/3ML) 0.083% nebulizer solution  Inhale 2.5 mg into the lungs 2 (two) times daily.    Marland Kitchen ALPRAZolam (XANAX) 0.5 MG tablet 1 tablet nightly as needed for sleep  0  . atorvastatin (LIPITOR) 80 MG tablet Take 80 mg by mouth daily.    . clopidogrel (PLAVIX) 75 MG tablet Take 75 mg by mouth daily.  6  . Cyanocobalamin (B-12) 500 MCG TABS Take 3 tablets by mouth daily.    Marland Kitchen diltiazem (CARDIZEM CD) 240 MG 24 hr capsule Take 240 mg by mouth daily.    Marland Kitchen EPIPEN 2-PAK 0.3 MG/0.3ML SOAJ injection See admin instructions.  0  . fluticasone (FLONASE) 50 MCG/ACT nasal spray Place 2 sprays into both nostrils daily.     . furosemide (LASIX) 20 MG tablet Take 20 mg by mouth daily.     Marland Kitchen gabapentin (NEURONTIN) 300 MG capsule 1 (ONE) CAPSULE BY MOUTH THREE TIMES DAILY  5  . glimepiride (AMARYL) 4 MG tablet Take 4 mg by mouth daily.    . hydrALAZINE (APRESOLINE) 10 MG tablet Take 10 mg by mouth 3 (three) times daily.    Marland Kitchen ipratropium-albuterol (DUONEB) 0.5-2.5 (3) MG/3ML SOLN     . loratadine (CLARITIN) 10 MG tablet Take 10 mg by mouth daily.    . metFORMIN (GLUCOPHAGE) 500 MG tablet Take by mouth 2 (two) times daily with a meal.    . metoprolol tartrate (LOPRESSOR) 25 MG tablet Take 25 mg by mouth 2 (two) times daily.    . mometasone (ASMANEX) 220 MCG/INH inhaler Inhale 2 puffs into the lungs daily.    . montelukast (SINGULAIR) 10 MG tablet Take 10 mg by mouth daily.    . Multiple Vitamins-Minerals (CENTRUM SILVER ULTRA WOMENS) TABS Take 1 tablet by mouth daily.    Marland Kitchen  nitroGLYCERIN (NITROSTAT) 0.4 MG SL tablet Place 0.4 mg under the tongue every 5 (five) minutes as needed for chest pain.    Marland Kitchen omeprazole (PRILOSEC) 20 MG capsule     . Red Yeast Rice 600 MG TABS Take 2 tablets by mouth daily.    Marland Kitchen warfarin (COUMADIN) 1 MG tablet Take 1 mg by mouth as directed.    . warfarin (COUMADIN) 5 MG tablet Take 5 mg by mouth as directed.     No current facility-administered medications on file prior to visit.    Allergies  Allergen Reactions  . Ioxaglate  Hives  . Ivp Dye [Iodinated Diagnostic Agents]     Recent Results (from the past 2160 hour(s))  Basic metabolic panel     Status: Abnormal   Collection Time: 11/14/17  2:04 PM  Result Value Ref Range   Glucose 118 (H) 65 - 99 mg/dL   BUN 14 6 - 24 mg/dL   Creatinine, Ser 6.00 (H) 0.57 - 1.00 mg/dL   GFR calc non Af Amer 61 >59 mL/min/1.73   GFR calc Af Amer 71 >59 mL/min/1.73   BUN/Creatinine Ratio 14 9 - 23   Sodium 147 (H) 134 - 144 mmol/L   Potassium 4.3 3.5 - 5.2 mmol/L   Chloride 107 (H) 96 - 106 mmol/L   CO2 23 20 - 29 mmol/L   Calcium 8.9 8.7 - 10.2 mg/dL  Pro b natriuretic peptide (BNP)     Status: Abnormal   Collection Time: 11/14/17  2:04 PM  Result Value Ref Range   NT-Pro BNP 481 (H) 0 - 287 pg/mL    Comment: The following cut-points have been suggested for the use of proBNP for the diagnostic evaluation of heart failure (HF) in patients with acute dyspnea: Modality                     Age           Optimal Cut                            (years)            Point ------------------------------------------------------ Diagnosis (rule in HF)        <50            450 pg/mL                           50 - 75            900 pg/mL                               >75           1800 pg/mL Exclusion (rule out HF)  Age independent     300 pg/mL     Objective: General: Patient is awake, alert, and oriented x 3 and in no acute distress.  Integument: Skin is warm, dry and supple bilateral. Nails are tender, long, thickened and  dystrophic with subungual debris, consistent with onychomycosis, 1-5 bilateral. No signs of infection. No open lesions or preulcerative lesions present bilateral. Remaining integument unremarkable.  Vasculature:  Dorsalis Pedis pulse 1/4 bilateral. Posterior Tibial pulse  0/4 bilateral.  Capillary fill time <3 sec 1-5 bilateral. Positive hair growth to the level of the digits. Temperature gradient within normal limits. No varicosities present  bilateral.  No edema present bilateral.   Neurology: The patient has intact sensation measured with a 5.07/10g Semmes Weinstein Monofilament at all pedal sites bilateral . Vibratory sensation diminished bilateral with tuning fork. No Babinski sign present bilateral.   Musculoskeletal:Minimal tenderness to arches bilateral. Asymptomatic pes planus pedal deformities noted bilateral. Muscular strength 5/5 in all lower extremity muscular groups bilateral without pain on range of motion except at midfoot where there is arthritis. No tenderness with calf compression bilateral.  Assessment and Plan: Problem List Items Addressed This Visit    None    Visit Diagnoses    Pain due to onychomycosis of toenails of both feet    -  Primary   Type 2 diabetes, uncontrolled, with neuropathy (HCC)       Foot pain, bilateral       Plantar fasciitis           -Examined patient. -Discussed and educated patient on diabetic foot care, especially with  regards to the vascular, neurological and musculoskeletal systems.  -Stressed the importance of good glycemic control and the detriment of not controlling glucose levels in relation to the foot. -Mechanically debrided all nails 1-5 bilateral using sterile nail nipper and filed with dremel without incident  -Encouraged home PT and Rx given for PT at [facility] for bilateral fasciitis  -Answered all patient questions -Patient to return  in 2.5 to 3 months for at risk foot care -Patient advised to call the office if any problems or questions arise in the meantime.  [name], DPM

## 2017-12-12 DIAGNOSIS — M6281 Muscle weakness (generalized): Secondary | ICD-10-CM | POA: Diagnosis not present

## 2017-12-12 DIAGNOSIS — M545 Low back pain: Secondary | ICD-10-CM | POA: Diagnosis not present

## 2017-12-12 DIAGNOSIS — R2689 Other abnormalities of gait and mobility: Secondary | ICD-10-CM | POA: Diagnosis not present

## 2017-12-12 DIAGNOSIS — R2681 Unsteadiness on feet: Secondary | ICD-10-CM | POA: Diagnosis not present

## 2017-12-13 DIAGNOSIS — R2689 Other abnormalities of gait and mobility: Secondary | ICD-10-CM | POA: Diagnosis not present

## 2017-12-13 DIAGNOSIS — R2681 Unsteadiness on feet: Secondary | ICD-10-CM | POA: Diagnosis not present

## 2017-12-13 DIAGNOSIS — M6281 Muscle weakness (generalized): Secondary | ICD-10-CM | POA: Diagnosis not present

## 2017-12-13 DIAGNOSIS — M545 Low back pain: Secondary | ICD-10-CM | POA: Diagnosis not present

## 2017-12-16 ENCOUNTER — Ambulatory Visit (INDEPENDENT_AMBULATORY_CARE_PROVIDER_SITE_OTHER): Payer: Medicare Other | Admitting: Cardiology

## 2017-12-16 ENCOUNTER — Encounter: Payer: Self-pay | Admitting: Cardiology

## 2017-12-16 VITALS — BP 114/70 | HR 87 | Ht 71.0 in | Wt 366.1 lb

## 2017-12-16 DIAGNOSIS — E785 Hyperlipidemia, unspecified: Secondary | ICD-10-CM | POA: Diagnosis not present

## 2017-12-16 DIAGNOSIS — I420 Dilated cardiomyopathy: Secondary | ICD-10-CM | POA: Diagnosis not present

## 2017-12-16 DIAGNOSIS — Z952 Presence of prosthetic heart valve: Secondary | ICD-10-CM | POA: Diagnosis not present

## 2017-12-16 DIAGNOSIS — I480 Paroxysmal atrial fibrillation: Secondary | ICD-10-CM | POA: Diagnosis not present

## 2017-12-16 DIAGNOSIS — E1151 Type 2 diabetes mellitus with diabetic peripheral angiopathy without gangrene: Secondary | ICD-10-CM

## 2017-12-16 DIAGNOSIS — J4520 Mild intermittent asthma, uncomplicated: Secondary | ICD-10-CM | POA: Diagnosis not present

## 2017-12-16 DIAGNOSIS — G4733 Obstructive sleep apnea (adult) (pediatric): Secondary | ICD-10-CM | POA: Diagnosis not present

## 2017-12-16 DIAGNOSIS — Z95810 Presence of automatic (implantable) cardiac defibrillator: Secondary | ICD-10-CM

## 2017-12-16 NOTE — Progress Notes (Signed)
Cardiology Office Note:    Date:  12/16/2017   ID:  Shannon Baxter, DOB 29-Apr-1961, MRN [id number]  PCP:  Paulina Fusi, MD  Cardiologist:  Gypsy Balsam, MD    Referring MD: Paulina Fusi, MD   Chief Complaint  Patient presents with  . 1 month follow up  Doing well  History of Present Illness:    Shannon Baxter is a 57 y.o. female with status post aortic valve replacement.  Cardiomyopathy.  Overall she said she is doing well however her echocardiogram showed deterioration of left ventricle ejection fraction 30%.  I will switch her to appropriate beta-blocker and in my opinion carvedilol will be better than metoprolol.  However there is some unclear dose of metoprolol she takes right now will clarify this and put on appropriate dose of carvedilol.  Also I think it would be beneficial for her to switch from Amaryl to empagliflozin.  I will talk to her primary care physician about that.  Again the key is to do everything we can to improve her left ventricular ejection fraction.  Past Medical History:  Diagnosis Date  . Allergy   . Aortic insufficiency   . Aortic stenosis   . Arthritis   . Asthma   . CHF (congestive heart failure) (HCC)   . Claudication (HCC)   . Coronary artery disease   . Diabetes mellitus without complication (HCC)   . GERD (gastroesophageal reflux disease)   . Heart murmur   . Hyperlipidemia   . Hypertension   . Nutritional and metabolic cardiomyopathy (HCC)   . PVD (peripheral vascular disease) (HCC)   . Sleep apnea   . Stroke [facility])     Past Surgical History:  Procedure Laterality Date  . CARDIAC CATHETERIZATION    . INSERT / REPLACE / REMOVE PACEMAKER     Medtronic ICD  . MECHANICAL AORTIC VALVE REPLACEMENT    . PERIPHERAL ARTERIAL STENT GRAFT    . TUBAL LIGATION      Current Medications: Current Meds  Medication Sig  . acetaminophen (TYLENOL) 500 MG tablet Take 1,500 mg by mouth 2 (two) times daily as needed for pain.  Marland Kitchen albuterol  (PROVENTIL) (2.5 MG/3ML) 0.083% nebulizer solution Inhale 2.5 mg into the lungs 2 (two) times daily.  Marland Kitchen ALPRAZolam (XANAX) 0.5 MG tablet 1 tablet nightly as needed for sleep  . atorvastatin (LIPITOR) 80 MG tablet Take 80 mg by mouth daily.  . clopidogrel (PLAVIX) 75 MG tablet Take 75 mg by mouth daily.  . Cyanocobalamin (B-12) 500 MCG TABS Take 3 tablets by mouth daily.  Marland Kitchen diltiazem (CARDIZEM CD) 240 MG 24 hr capsule Take 240 mg by mouth daily.  Marland Kitchen EPIPEN 2-PAK 0.3 MG/0.3ML SOAJ injection See admin instructions.  . fluticasone (FLONASE) 50 MCG/ACT nasal spray Place 2 sprays into both nostrils daily.   . furosemide (LASIX) 20 MG tablet Take 20 mg by mouth daily.   Marland Kitchen gabapentin (NEURONTIN) 300 MG capsule 1 (ONE) CAPSULE BY MOUTH THREE TIMES DAILY  . glimepiride (AMARYL) 4 MG tablet Take 4 mg by mouth daily.  . hydrALAZINE (APRESOLINE) 10 MG tablet Take 10 mg by mouth 3 (three) times daily.  Marland Kitchen ipratropium-albuterol (DUONEB) 0.5-2.5 (3) MG/3ML SOLN   . loratadine (CLARITIN) 10 MG tablet Take 10 mg by mouth daily.  . metFORMIN (GLUCOPHAGE) 500 MG tablet Take by mouth 2 (two) times daily with a meal.  . metoprolol tartrate (LOPRESSOR) 25 MG tablet Take 25 mg by mouth 2 (two) times daily.  Marland Kitchen  mometasone (ASMANEX) 220 MCG/INH inhaler Inhale 2 puffs into the lungs daily.  . montelukast (SINGULAIR) 10 MG tablet Take 10 mg by mouth daily.  . Multiple Vitamins-Minerals (CENTRUM SILVER ULTRA WOMENS) TABS Take 1 tablet by mouth daily.  . nitroGLYCERIN (NITROSTAT) 0.4 MG SL tablet Place 0.4 mg under the tongue every 5 (five) minutes as needed for chest pain.  Marland Kitchen omeprazole (PRILOSEC) 20 MG capsule   . Red Yeast Rice 600 MG TABS Take 2 tablets by mouth daily.  Marland Kitchen warfarin (COUMADIN) 1 MG tablet Take 1 mg by mouth as directed.  . warfarin (COUMADIN) 5 MG tablet Take 5 mg by mouth as directed.     Allergies:   Ioxaglate and Ivp dye [iodinated diagnostic agents]   Social History   Socioeconomic History  .  Marital status: Single    Spouse name: None  . Number of children: None  . Years of education: None  . Highest education level: None  Social Needs  . Financial resource strain: None  . Food insecurity - worry: None  . Food insecurity - inability: None  . Transportation needs - medical: None  . Transportation needs - non-medical: None  Occupational History  . None  Tobacco Use  . Smoking status: Former Games developer  . Smokeless tobacco: Never Used  Substance and Sexual Activity  . Alcohol use: No  . Drug use: No  . Sexual activity: None  Other Topics Concern  . None  Social History Narrative  . None     Family History: The patient's family history includes Heart disease in her brother. ROS:   Please see the history of present illness.    All 14 point review of systems negative except as described per history of present illness  EKGs/Labs/Other Studies Reviewed:      Recent Labs: 11/14/2017: BUN 14; Creatinine, Ser 1.03; NT-Pro BNP 481; Potassium 4.3; Sodium 147  Recent Lipid Panel    Component Value Date/Time   CHOL  07/04/2007 0815    200        ATP III CLASSIFICATION:  <200     mg/dL   Desirable  885-027  mg/dL   Borderline High  >=741    mg/dL   High   TRIG 287 86/76/7209 0815   HDL 47 07/04/2007 0815   CHOLHDL 4.3 07/04/2007 0815   VLDL 28 07/04/2007 0815   LDLCALC (H) 07/04/2007 0815    125        Total Cholesterol/HDL:CHD Risk Coronary Heart Disease Risk Table                     Men   Women  1/2 Average Risk   3.4   3.3    Physical Exam:    VS:  BP 114/70   Pulse 87   Ht 5\' 11"  (1.803 m)   Wt (!) 366 lb 1.9 oz (166.1 kg)   SpO2 98%   BMI 51.06 kg/m     Wt Readings from Last 3 Encounters:  12/16/17 (!) 366 lb 1.9 oz (166.1 kg)  11/14/17 (!) 362 lb (164.2 kg)  07/01/17 (!) 338 lb (153.3 kg)     GEN:  Well nourished, well developed in no acute distress HEENT: Normal NECK: No JVD; No carotid bruits LYMPHATICS: No lymphadenopathy CARDIAC: RRR,  no murmurs, no rubs, no gallops RESPIRATORY:  Clear to auscultation without rales, wheezing or rhonchi  ABDOMEN: Soft, non-tender, non-distended MUSCULOSKELETAL:  No edema; No deformity  SKIN: Warm and dry  LOWER EXTREMITIES: no swelling NEUROLOGIC:  Alert and oriented x 3 PSYCHIATRIC:  Normal affect   ASSESSMENT:    1. Paroxysmal atrial fibrillation (HCC)   2. Dilated cardiomyopathy (HCC)   3. Type 2 diabetes mellitus with diabetic peripheral angiopathy without gangrene, without long-term current use of insulin (HCC)   4. Dyslipidemia   5. Presence of automatic implantable cardioverter-defibrillator   6. Status post aortic valve replacement    PLAN:    In order of problems listed above:  1. Proximal atrial fibrillation: Anticoagulated which I will continue. 2. Dilated cardiomyopathy: We will try to switch her to appropriate beta-blocker.  In the future we will consider restarting ACE inhibitor. 3. Dyslipidemia: Continue with statin. 4. See the present: No recent discharges.   Medication Adjustments/Labs and Tests Ordered: Current medicines are reviewed at length with the patient today.  Concerns regarding medicines are outlined above.  No orders of the defined types were placed in this encounter.  Medication changes: No orders of the defined types were placed in this encounter.   Signed, Georgeanna Lea, MD, [facility] 12/16/2017 3:21 PM    [facility]

## 2017-12-16 NOTE — Patient Instructions (Signed)
Medication Instructions:  Your physician recommends that you continue on your current medications as directed. Please refer to the Current Medication list given to you today.  Contact the office about to confirm how you are taking your Metoprolol.  Labwork: None ordered  Testing/Procedures: None ordered  Follow-Up: Your physician recommends that you schedule a follow-up appointment in: 2 weeks with Dr. Bing Matter   Any Other Special Instructions Will Be Listed Below (If Applicable).     If you need a refill on your cardiac medications before your next appointment, please call your pharmacy.

## 2017-12-17 DIAGNOSIS — R2681 Unsteadiness on feet: Secondary | ICD-10-CM | POA: Diagnosis not present

## 2017-12-17 DIAGNOSIS — M545 Low back pain: Secondary | ICD-10-CM | POA: Diagnosis not present

## 2017-12-17 DIAGNOSIS — R2689 Other abnormalities of gait and mobility: Secondary | ICD-10-CM | POA: Diagnosis not present

## 2017-12-17 DIAGNOSIS — M6281 Muscle weakness (generalized): Secondary | ICD-10-CM | POA: Diagnosis not present

## 2017-12-19 ENCOUNTER — Telehealth: Payer: Self-pay

## 2017-12-19 MED ORDER — CARVEDILOL 25 MG PO TABS: 25.0000 mg | ORAL_TABLET | Freq: Two times a day (BID) | ORAL | 3 refills | Status: DC

## 2017-12-19 NOTE — Telephone Encounter (Signed)
Patient contacted the office about how she has been taking her Metoprolol, which is as follows, 100 mg in the morning, 100 mg at lunch and 50 mg at night. She advised that Dr. Terri Skains had wrote it for her to take this way. I did advise Dr. Bing Matter. Per Dr. Bing Matter we will have her stop the Metoprolol and start Carvedilol 25 mg twice daily. I have spoke with the patient to let her know. Carvedilol has been sent to [facility] on [address] [facility].

## 2017-12-20 DIAGNOSIS — M545 Low back pain: Secondary | ICD-10-CM | POA: Diagnosis not present

## 2017-12-20 DIAGNOSIS — M6281 Muscle weakness (generalized): Secondary | ICD-10-CM | POA: Diagnosis not present

## 2017-12-20 DIAGNOSIS — R2689 Other abnormalities of gait and mobility: Secondary | ICD-10-CM | POA: Diagnosis not present

## 2017-12-20 DIAGNOSIS — R2681 Unsteadiness on feet: Secondary | ICD-10-CM | POA: Diagnosis not present

## 2017-12-23 DIAGNOSIS — R791 Abnormal coagulation profile: Secondary | ICD-10-CM | POA: Diagnosis not present

## 2017-12-23 DIAGNOSIS — K625 Hemorrhage of anus and rectum: Secondary | ICD-10-CM | POA: Diagnosis not present

## 2017-12-25 DIAGNOSIS — I2510 Atherosclerotic heart disease of native coronary artery without angina pectoris: Secondary | ICD-10-CM | POA: Diagnosis not present

## 2017-12-25 DIAGNOSIS — I5022 Chronic systolic (congestive) heart failure: Secondary | ICD-10-CM | POA: Diagnosis not present

## 2017-12-25 DIAGNOSIS — R791 Abnormal coagulation profile: Secondary | ICD-10-CM | POA: Diagnosis not present

## 2017-12-25 DIAGNOSIS — I6523 Occlusion and stenosis of bilateral carotid arteries: Secondary | ICD-10-CM | POA: Diagnosis not present

## 2017-12-25 DIAGNOSIS — I060 Rheumatic aortic stenosis: Secondary | ICD-10-CM | POA: Diagnosis not present

## 2017-12-25 DIAGNOSIS — I70213 Atherosclerosis of native arteries of extremities with intermittent claudication, bilateral legs: Secondary | ICD-10-CM | POA: Diagnosis not present

## 2017-12-30 DIAGNOSIS — R791 Abnormal coagulation profile: Secondary | ICD-10-CM | POA: Diagnosis not present

## 2017-12-30 DIAGNOSIS — G4733 Obstructive sleep apnea (adult) (pediatric): Secondary | ICD-10-CM | POA: Diagnosis not present

## 2017-12-31 ENCOUNTER — Encounter: Payer: Self-pay | Admitting: Cardiology

## 2017-12-31 ENCOUNTER — Ambulatory Visit (INDEPENDENT_AMBULATORY_CARE_PROVIDER_SITE_OTHER): Payer: Medicare Other | Admitting: Cardiology

## 2017-12-31 VITALS — BP 110/60 | HR 87 | Ht 71.0 in | Wt 366.4 lb

## 2017-12-31 DIAGNOSIS — R0609 Other forms of dyspnea: Secondary | ICD-10-CM | POA: Diagnosis not present

## 2017-12-31 DIAGNOSIS — Z95810 Presence of automatic (implantable) cardiac defibrillator: Secondary | ICD-10-CM

## 2017-12-31 DIAGNOSIS — I480 Paroxysmal atrial fibrillation: Secondary | ICD-10-CM

## 2017-12-31 DIAGNOSIS — Z952 Presence of prosthetic heart valve: Secondary | ICD-10-CM

## 2017-12-31 DIAGNOSIS — I2510 Atherosclerotic heart disease of native coronary artery without angina pectoris: Secondary | ICD-10-CM | POA: Diagnosis not present

## 2017-12-31 NOTE — Patient Instructions (Signed)
Medication Instructions:  Your physician recommends that you continue on your current medications as directed. Please refer to the Current Medication list given to you today.   Labwork: Your physician recommends that you return for lab work today: BMP, ProBNP.   Testing/Procedures: None  Follow-Up: Your physician recommends that you schedule a follow-up appointment in: 1 month.  Any Other Special Instructions Will Be Listed Below (If Applicable).     If you need a refill on your cardiac medications before your next appointment, please call your pharmacy.   

## 2017-12-31 NOTE — Progress Notes (Signed)
Cardiology Office Note:    Date:  12/31/2017   ID:  Shannon Baxter, DOB 1961/06/08, MRN [id number]  PCP:  Paulina Fusi, MD  Cardiologist:  Gypsy Balsam, MD    Referring MD: Paulina Fusi, MD   Chief Complaint  Patient presents with  . Follow-up  Doing well but complained of being weak and tired  History of Present Illness:    Shannon Baxter is a 57 y.o. female with history of aortic valve replacement, cardiomyopathy ejection fraction 30%, ICD.  Gradually trying to increase medications and recheck her left ventricular ejection fraction few months later.  In the meantime she saw Dr. Garner Nash who very appropriately increased dose of Entresto.  Be tolerating this well.  I will ask you to have Chem-7 done today to make sure her kidneys are intact.  I will continue with high dose of beta-blocker like she is already on it and Entresto.  Denies having any chest pain tightness squeezing pressure burning chest.  Her pulmonologist make adjustment to her equipment for sleep apnea apparently last night she tried for the first time BiPAP mask and she seems to be doing well with it.  Denies having any paroxysmal nocturnal dyspnea but does have some swelling of lower extremities with just only mild.  Complaining of weight and she would like to lose some but her weight is stable and the same like it was last time.  Past Medical History:  Diagnosis Date  . Allergy   . Aortic insufficiency   . Aortic stenosis   . Arthritis   . Asthma   . CHF (congestive heart failure) (HCC)   . Claudication (HCC)   . Coronary artery disease   . Diabetes mellitus without complication (HCC)   . GERD (gastroesophageal reflux disease)   . Heart murmur   . Hyperlipidemia   . Hypertension   . Nutritional and metabolic cardiomyopathy (HCC)   . PVD (peripheral vascular disease) (HCC)   . Sleep apnea   . Stroke [facility])     Past Surgical History:  Procedure Laterality Date  . CARDIAC CATHETERIZATION    . INSERT /  REPLACE / REMOVE PACEMAKER     Medtronic ICD  . MECHANICAL AORTIC VALVE REPLACEMENT    . PERIPHERAL ARTERIAL STENT GRAFT    . TUBAL LIGATION      Current Medications: Current Meds  Medication Sig  . acetaminophen (TYLENOL) 500 MG tablet Take 1,500 mg by mouth 2 (two) times daily as needed for pain.  Marland Kitchen ALPRAZolam (XANAX) 0.5 MG tablet 1 tablet nightly as needed for sleep  . atorvastatin (LIPITOR) 80 MG tablet Take 80 mg by mouth daily.  . beclomethasone (QVAR) 80 MCG/ACT inhaler Inhale into the lungs 2 (two) times daily.  . carvedilol (COREG) 25 MG tablet Take 1 tablet (25 mg total) by mouth 2 (two) times daily.  . clopidogrel (PLAVIX) 75 MG tablet Take 75 mg by mouth daily.  . Cyanocobalamin (B-12) 500 MCG TABS Take 3 tablets by mouth daily.  Marland Kitchen diltiazem (CARDIZEM CD) 240 MG 24 hr capsule Take 240 mg by mouth daily.  Marland Kitchen ENTRESTO 97-103 MG Take 1 tablet by mouth 2 (two) times daily.  Marland Kitchen EPIPEN 2-PAK 0.3 MG/0.3ML SOAJ injection See admin instructions.  Marland Kitchen ezetimibe (ZETIA) 10 MG tablet Take 10 mg by mouth daily.  . fluticasone (FLONASE) 50 MCG/ACT nasal spray Place 2 sprays into both nostrils daily.   . furosemide (LASIX) 20 MG tablet Take 20 mg by mouth daily.   Marland Kitchen  gabapentin (NEURONTIN) 300 MG capsule 1 (ONE) CAPSULE BY MOUTH THREE TIMES DAILY  . glimepiride (AMARYL) 4 MG tablet Take 4 mg by mouth daily.  . hydrALAZINE (APRESOLINE) 10 MG tablet Take 10 mg by mouth 3 (three) times daily.  Marland Kitchen ipratropium-albuterol (DUONEB) 0.5-2.5 (3) MG/3ML SOLN   . loratadine (CLARITIN) 10 MG tablet Take 10 mg by mouth daily.  . metFORMIN (GLUCOPHAGE) 500 MG tablet Take by mouth 2 (two) times daily with a meal.  . mometasone (ASMANEX) 220 MCG/INH inhaler Inhale 2 puffs into the lungs daily.  . montelukast (SINGULAIR) 10 MG tablet Take 10 mg by mouth daily.  . Multiple Vitamins-Minerals (CENTRUM SILVER ULTRA WOMENS) TABS Take 1 tablet by mouth daily.  . nitroGLYCERIN (NITROSTAT) 0.4 MG SL tablet Place 0.4  mg under the tongue every 5 (five) minutes as needed for chest pain.  Marland Kitchen omeprazole (PRILOSEC) 20 MG capsule   . Red Yeast Rice 600 MG TABS Take 2 tablets by mouth daily.  Marland Kitchen warfarin (COUMADIN) 1 MG tablet Take 1 mg by mouth as directed.  . warfarin (COUMADIN) 5 MG tablet Take 5 mg by mouth as directed.     Allergies:   Ioxaglate and Ivp dye [iodinated diagnostic agents]   Social History   Socioeconomic History  . Marital status: Single    Spouse name: None  . Number of children: None  . Years of education: None  . Highest education level: None  Social Needs  . Financial resource strain: None  . Food insecurity - worry: None  . Food insecurity - inability: None  . Transportation needs - medical: None  . Transportation needs - non-medical: None  Occupational History  . None  Tobacco Use  . Smoking status: Former Games developer  . Smokeless tobacco: Never Used  Substance and Sexual Activity  . Alcohol use: No  . Drug use: No  . Sexual activity: None  Other Topics Concern  . None  Social History Narrative  . None     Family History: The patient's family history includes Heart disease in her brother. ROS:   Please see the history of present illness.    All 14 point review of systems negative except as described per history of present illness  EKGs/Labs/Other Studies Reviewed:      Recent Labs: 11/14/2017: BUN 14; Creatinine, Ser 1.03; NT-Pro BNP 481; Potassium 4.3; Sodium 147  Recent Lipid Panel    Component Value Date/Time   CHOL  07/04/2007 0815    200        ATP III CLASSIFICATION:  <200     mg/dL   Desirable  161-096  mg/dL   Borderline High  >=045    mg/dL   High   TRIG 409 81/19/1478 0815   HDL 47 07/04/2007 0815   CHOLHDL 4.3 07/04/2007 0815   VLDL 28 07/04/2007 0815   LDLCALC (H) 07/04/2007 0815    125        Total Cholesterol/HDL:CHD Risk Coronary Heart Disease Risk Table                     Men   Women  1/2 Average Risk   3.4   3.3    Physical Exam:     VS:  BP 110/60   Pulse 87   Ht 5\' 11"  (1.803 m)   Wt (!) 366 lb 6.4 oz (166.2 kg)   SpO2 92%   BMI 51.10 kg/m     Wt Readings from Last 3  Encounters:  12/31/17 (!) 366 lb 6.4 oz (166.2 kg)  12/16/17 (!) 366 lb 1.9 oz (166.1 kg)  11/14/17 (!) 362 lb (164.2 kg)     GEN:  Well nourished, well developed in no acute distress HEENT: Normal NECK: No JVD; No carotid bruits LYMPHATICS: No lymphadenopathy CARDIAC: RRR, no murmurs, no rubs, no gallops RESPIRATORY:  Clear to auscultation without rales, wheezing or rhonchi  ABDOMEN: Soft, non-tender, non-distended MUSCULOSKELETAL:  No edema; No deformity  SKIN: Warm and dry LOWER EXTREMITIES: no swelling NEUROLOGIC:  Alert and oriented x 3 PSYCHIATRIC:  Normal affect   ASSESSMENT:    1. Paroxysmal atrial fibrillation (HCC)   2. Coronary artery disease involving native coronary artery of native heart without angina pectoris   3. Status post aortic valve replacement   4. Dyspnea on exertion   5. Presence of automatic implantable cardioverter-defibrillator    PLAN:    In order of problems listed above:  1. Paroxysmal atrial fibrillation: Anticoagulated with Coumadin Dr. Veatrice Kells is following it.  I will continue.  Denies having any recent episodes. 2. Coronary artery disease: Stable and appropriate medication which I will continue. 3. Status post aortic valve replacement: Last echocardiogram reviewed stable. 4. Dyspnea on exertion: Ventricular ejection fraction diminished.  On appropriate medications I will check Chem-7 make sure it is acceptable to continue with those medications. 5. ICD present: She is scheduled to see Dr. Linton Ham for pacemaker/defibrillator interrogation within next months.   Medication Adjustments/Labs and Tests Ordered: Current medicines are reviewed at length with the patient today.  Concerns regarding medicines are outlined above.  No orders of the defined types were placed in this encounter.  Medication  changes: No orders of the defined types were placed in this encounter.   Signed, Georgeanna Lea, MD, [facility] 12/31/2017 2:19 PM    [facility]

## 2018-01-01 DIAGNOSIS — M545 Low back pain: Secondary | ICD-10-CM | POA: Diagnosis not present

## 2018-01-01 DIAGNOSIS — R2681 Unsteadiness on feet: Secondary | ICD-10-CM | POA: Diagnosis not present

## 2018-01-01 DIAGNOSIS — R2689 Other abnormalities of gait and mobility: Secondary | ICD-10-CM | POA: Diagnosis not present

## 2018-01-01 DIAGNOSIS — M6281 Muscle weakness (generalized): Secondary | ICD-10-CM | POA: Diagnosis not present

## 2018-01-01 DIAGNOSIS — Z95810 Presence of automatic (implantable) cardiac defibrillator: Secondary | ICD-10-CM | POA: Diagnosis not present

## 2018-01-01 LAB — BASIC METABOLIC PANEL
BUN / CREAT RATIO: 17 (ref 9–23)
BUN: 14 mg/dL (ref 6–24)
CHLORIDE: 103 mmol/L (ref 96–106)
CO2: 22 mmol/L (ref 20–29)
Calcium: 8.9 mg/dL (ref 8.7–10.2)
Creatinine, Ser: 0.82 mg/dL (ref 0.57–1.00)
GFR calc non Af Amer: 81 mL/min/{1.73_m2} (ref >59)
GFR, EST AFRICAN AMERICAN: 93 mL/min/{1.73_m2} (ref >59)
GLUCOSE: 127 mg/dL — AB (ref 65–99)
POTASSIUM: 4 mmol/L (ref 3.5–5.2)
SODIUM: 142 mmol/L (ref 134–144)

## 2018-01-01 LAB — PRO B NATRIURETIC PEPTIDE: NT-PRO BNP: 212 pg/mL (ref 0–287)

## 2018-01-02 ENCOUNTER — Telehealth: Payer: Self-pay | Admitting: Cardiology

## 2018-01-02 MED ORDER — FUROSEMIDE 20 MG PO TABS: 20.0000 mg | ORAL_TABLET | Freq: Every day | ORAL | 3 refills | Status: DC

## 2018-01-02 NOTE — Telephone Encounter (Signed)
[facility] [address] in [place]

## 2018-01-02 NOTE — Telephone Encounter (Signed)
Refill sent.

## 2018-01-03 DIAGNOSIS — R2689 Other abnormalities of gait and mobility: Secondary | ICD-10-CM | POA: Diagnosis not present

## 2018-01-03 DIAGNOSIS — R2681 Unsteadiness on feet: Secondary | ICD-10-CM | POA: Diagnosis not present

## 2018-01-03 DIAGNOSIS — M6281 Muscle weakness (generalized): Secondary | ICD-10-CM | POA: Diagnosis not present

## 2018-01-03 DIAGNOSIS — M545 Low back pain: Secondary | ICD-10-CM | POA: Diagnosis not present

## 2018-01-07 DIAGNOSIS — R2681 Unsteadiness on feet: Secondary | ICD-10-CM | POA: Diagnosis not present

## 2018-01-07 DIAGNOSIS — R2689 Other abnormalities of gait and mobility: Secondary | ICD-10-CM | POA: Diagnosis not present

## 2018-01-07 DIAGNOSIS — M6281 Muscle weakness (generalized): Secondary | ICD-10-CM | POA: Diagnosis not present

## 2018-01-07 DIAGNOSIS — M545 Low back pain: Secondary | ICD-10-CM | POA: Diagnosis not present

## 2018-01-08 ENCOUNTER — Other Ambulatory Visit: Payer: Self-pay

## 2018-01-08 MED ORDER — FUROSEMIDE 20 MG PO TABS: ORAL_TABLET | ORAL | 3 refills | Status: DC

## 2018-01-09 DIAGNOSIS — M6281 Muscle weakness (generalized): Secondary | ICD-10-CM | POA: Diagnosis not present

## 2018-01-09 DIAGNOSIS — M545 Low back pain: Secondary | ICD-10-CM | POA: Diagnosis not present

## 2018-01-09 DIAGNOSIS — R2681 Unsteadiness on feet: Secondary | ICD-10-CM | POA: Diagnosis not present

## 2018-01-09 DIAGNOSIS — R2689 Other abnormalities of gait and mobility: Secondary | ICD-10-CM | POA: Diagnosis not present

## 2018-01-13 DIAGNOSIS — J209 Acute bronchitis, unspecified: Secondary | ICD-10-CM | POA: Diagnosis not present

## 2018-01-13 DIAGNOSIS — G4733 Obstructive sleep apnea (adult) (pediatric): Secondary | ICD-10-CM | POA: Diagnosis not present

## 2018-01-13 DIAGNOSIS — I6789 Other cerebrovascular disease: Secondary | ICD-10-CM | POA: Diagnosis not present

## 2018-01-15 DIAGNOSIS — R2681 Unsteadiness on feet: Secondary | ICD-10-CM | POA: Diagnosis not present

## 2018-01-15 DIAGNOSIS — M6281 Muscle weakness (generalized): Secondary | ICD-10-CM | POA: Diagnosis not present

## 2018-01-15 DIAGNOSIS — R2689 Other abnormalities of gait and mobility: Secondary | ICD-10-CM | POA: Diagnosis not present

## 2018-01-15 DIAGNOSIS — M545 Low back pain: Secondary | ICD-10-CM | POA: Diagnosis not present

## 2018-01-17 DIAGNOSIS — M545 Low back pain: Secondary | ICD-10-CM | POA: Diagnosis not present

## 2018-01-17 DIAGNOSIS — M6281 Muscle weakness (generalized): Secondary | ICD-10-CM | POA: Diagnosis not present

## 2018-01-17 DIAGNOSIS — R2689 Other abnormalities of gait and mobility: Secondary | ICD-10-CM | POA: Diagnosis not present

## 2018-01-17 DIAGNOSIS — R2681 Unsteadiness on feet: Secondary | ICD-10-CM | POA: Diagnosis not present

## 2018-01-23 DIAGNOSIS — M6281 Muscle weakness (generalized): Secondary | ICD-10-CM | POA: Diagnosis not present

## 2018-01-23 DIAGNOSIS — R2681 Unsteadiness on feet: Secondary | ICD-10-CM | POA: Diagnosis not present

## 2018-01-23 DIAGNOSIS — M545 Low back pain: Secondary | ICD-10-CM | POA: Diagnosis not present

## 2018-01-23 DIAGNOSIS — R2689 Other abnormalities of gait and mobility: Secondary | ICD-10-CM | POA: Diagnosis not present

## 2018-01-28 DIAGNOSIS — R2689 Other abnormalities of gait and mobility: Secondary | ICD-10-CM | POA: Diagnosis not present

## 2018-01-28 DIAGNOSIS — M545 Low back pain: Secondary | ICD-10-CM | POA: Diagnosis not present

## 2018-01-28 DIAGNOSIS — R2681 Unsteadiness on feet: Secondary | ICD-10-CM | POA: Diagnosis not present

## 2018-01-28 DIAGNOSIS — M6281 Muscle weakness (generalized): Secondary | ICD-10-CM | POA: Diagnosis not present

## 2018-01-30 DIAGNOSIS — G4733 Obstructive sleep apnea (adult) (pediatric): Secondary | ICD-10-CM | POA: Diagnosis not present

## 2018-01-30 DIAGNOSIS — R2681 Unsteadiness on feet: Secondary | ICD-10-CM | POA: Diagnosis not present

## 2018-01-30 DIAGNOSIS — Z7901 Long term (current) use of anticoagulants: Secondary | ICD-10-CM | POA: Diagnosis not present

## 2018-01-30 DIAGNOSIS — M6281 Muscle weakness (generalized): Secondary | ICD-10-CM | POA: Diagnosis not present

## 2018-01-30 DIAGNOSIS — M545 Low back pain: Secondary | ICD-10-CM | POA: Diagnosis not present

## 2018-01-30 DIAGNOSIS — R2689 Other abnormalities of gait and mobility: Secondary | ICD-10-CM | POA: Diagnosis not present

## 2018-01-31 ENCOUNTER — Ambulatory Visit (INDEPENDENT_AMBULATORY_CARE_PROVIDER_SITE_OTHER): Payer: Medicare Other | Admitting: Cardiology

## 2018-01-31 ENCOUNTER — Encounter: Payer: Self-pay | Admitting: Cardiology

## 2018-01-31 VITALS — BP 110/66 | HR 96 | Ht 71.0 in | Wt 322.1 lb

## 2018-01-31 DIAGNOSIS — I10 Essential (primary) hypertension: Secondary | ICD-10-CM

## 2018-01-31 DIAGNOSIS — I420 Dilated cardiomyopathy: Secondary | ICD-10-CM | POA: Diagnosis not present

## 2018-01-31 DIAGNOSIS — Z95810 Presence of automatic (implantable) cardiac defibrillator: Secondary | ICD-10-CM | POA: Diagnosis not present

## 2018-01-31 DIAGNOSIS — I480 Paroxysmal atrial fibrillation: Secondary | ICD-10-CM | POA: Diagnosis not present

## 2018-01-31 NOTE — Progress Notes (Signed)
Cardiology Office Note:    Date:  01/31/2018   ID:  Shannon Baxter, DOB [birth year], MRN [id number]  PCP:  Paulina Fusi, MD  Cardiologist:  Gypsy Balsam, MD    Referring MD: Paulina Fusi, MD   Chief Complaint  Patient presents with  . Follow-up  Doing well  History of Present Illness:    Shannon Baxter is a 57 y.o. female with history of cardiomyopathy, status post aortic valve replacement, morbid obesity, coronary artery disease, type 2 diabetes.  Overall she seems to be doing well described to have some fatigue and weakness but overall coping quite well with the situation.  Past Medical History:  Diagnosis Date  . Allergy   . Aortic insufficiency   . Aortic stenosis   . Arthritis   . Asthma   . CHF (congestive heart failure) (HCC)   . Claudication (HCC)   . Coronary artery disease   . Diabetes mellitus without complication (HCC)   . GERD (gastroesophageal reflux disease)   . Heart murmur   . Hyperlipidemia   . Hypertension   . Nutritional and metabolic cardiomyopathy (HCC)   . PVD (peripheral vascular disease) (HCC)   . Sleep apnea   . Stroke [facility])     Past Surgical History:  Procedure Laterality Date  . CARDIAC CATHETERIZATION    . INSERT / REPLACE / REMOVE PACEMAKER     Medtronic ICD  . MECHANICAL AORTIC VALVE REPLACEMENT    . PERIPHERAL ARTERIAL STENT GRAFT    . TUBAL LIGATION      Current Medications: Current Meds  Medication Sig  . acetaminophen (TYLENOL) 500 MG tablet Take 1,500 mg by mouth 2 (two) times daily as needed for pain.  Marland Kitchen ALPRAZolam (XANAX) 0.5 MG tablet 1 tablet nightly as needed for sleep  . atorvastatin (LIPITOR) 80 MG tablet Take 80 mg by mouth daily.  . beclomethasone (QVAR) 80 MCG/ACT inhaler Inhale into the lungs 2 (two) times daily.  . carvedilol (COREG) 25 MG tablet Take 1 tablet (25 mg total) by mouth 2 (two) times daily.  . clopidogrel (PLAVIX) 75 MG tablet Take 75 mg by mouth daily.  . Cyanocobalamin (B-12) 500 MCG TABS  Take 3 tablets by mouth daily.  Marland Kitchen diltiazem (CARDIZEM CD) 240 MG 24 hr capsule Take 240 mg by mouth daily.  Marland Kitchen ENTRESTO 97-103 MG Take 1 tablet by mouth 2 (two) times daily.  Marland Kitchen EPIPEN 2-PAK 0.3 MG/0.3ML SOAJ injection See admin instructions.  Marland Kitchen ezetimibe (ZETIA) 10 MG tablet Take 10 mg by mouth daily.  . fluticasone (FLONASE) 50 MCG/ACT nasal spray Place 2 sprays into both nostrils daily.   . furosemide (LASIX) 20 MG tablet Alternate 2 tablets daily with 1 tablet daily  . gabapentin (NEURONTIN) 300 MG capsule 1 (ONE) CAPSULE BY MOUTH THREE TIMES DAILY  . glimepiride (AMARYL) 4 MG tablet Take 4 mg by mouth daily.  . hydrALAZINE (APRESOLINE) 10 MG tablet Take 10 mg by mouth 3 (three) times daily.  Marland Kitchen ipratropium-albuterol (DUONEB) 0.5-2.5 (3) MG/3ML SOLN   . loratadine (CLARITIN) 10 MG tablet Take 10 mg by mouth daily.  . metFORMIN (GLUCOPHAGE) 500 MG tablet Take by mouth 2 (two) times daily with a meal.  . mometasone (ASMANEX) 220 MCG/INH inhaler Inhale 2 puffs into the lungs daily.  . montelukast (SINGULAIR) 10 MG tablet Take 10 mg by mouth daily.  . Multiple Vitamins-Minerals (CENTRUM SILVER ULTRA WOMENS) TABS Take 1 tablet by mouth daily.  . nitroGLYCERIN (NITROSTAT) 0.4 MG SL tablet  Place 0.4 mg under the tongue every 5 (five) minutes as needed for chest pain.  Marland Kitchen omeprazole (PRILOSEC) 20 MG capsule   . Red Yeast Rice 600 MG TABS Take 2 tablets by mouth daily.  Marland Kitchen warfarin (COUMADIN) 1 MG tablet Take 1 mg by mouth as directed.  . warfarin (COUMADIN) 5 MG tablet Take 5 mg by mouth as directed.     Allergies:   Ioxaglate and Ivp dye [iodinated diagnostic agents]   Social History   Socioeconomic History  . Marital status: Single    Spouse name: Not on file  . Number of children: Not on file  . Years of education: Not on file  . Highest education level: Not on file  Occupational History  . Not on file  Social Needs  . Financial resource strain: Not on file  . Food insecurity:     Worry: Not on file    Inability: Not on file  . Transportation needs:    Medical: Not on file    Non-medical: Not on file  Tobacco Use  . Smoking status: Former Games developer  . Smokeless tobacco: Never Used  Substance and Sexual Activity  . Alcohol use: No  . Drug use: No  . Sexual activity: Not on file  Lifestyle  . Physical activity:    Days per week: Not on file    Minutes per session: Not on file  . Stress: Not on file  Relationships  . Social connections:    Talks on phone: Not on file    Gets together: Not on file    Attends religious service: Not on file    Active member of club or organization: Not on file    Attends meetings of clubs or organizations: Not on file    Relationship status: Not on file  Other Topics Concern  . Not on file  Social History Narrative  . Not on file     Family History: The patient's family history includes Heart disease in her brother. ROS:   Please see the history of present illness.    All 14 point review of systems negative except as described per history of present illness  EKGs/Labs/Other Studies Reviewed:      Recent Labs: 12/31/2017: BUN 14; Creatinine, Ser 0.82; NT-Pro BNP 212; Potassium 4.0; Sodium 142  Recent Lipid Panel    Component Value Date/Time   CHOL  07/04/2007 0815    200        ATP III CLASSIFICATION:  <200     mg/dL   Desirable  454-098  mg/dL   Borderline High  >=119    mg/dL   High   TRIG 147 82/95/6213 0815   HDL 47 07/04/2007 0815   CHOLHDL 4.3 07/04/2007 0815   VLDL 28 07/04/2007 0815   LDLCALC (H) 07/04/2007 0815    125        Total Cholesterol/HDL:CHD Risk Coronary Heart Disease Risk Table                     Men   Women  1/2 Average Risk   3.4   3.3    Physical Exam:    VS:  BP 110/66   Pulse 96   Ht 5\' 11"  (1.803 m)   Wt (!) 322 lb 1.9 oz (146.1 kg)   SpO2 93%   BMI 44.93 kg/m     Wt Readings from Last 3 Encounters:  01/31/18 (!) 322 lb 1.9 oz (146.1 kg)  12/31/17 Marland Kitchen)  366 lb 6.4 oz  (166.2 kg)  12/16/17 (!) 366 lb 1.9 oz (166.1 kg)     GEN:  Well nourished, well developed in no acute distress HEENT: Normal NECK: No JVD; No carotid bruits LYMPHATICS: No lymphadenopathy CARDIAC: RRR, no murmurs, no rubs, no gallops RESPIRATORY:  Clear to auscultation without rales, wheezing or rhonchi  ABDOMEN: Soft, non-tender, non-distended MUSCULOSKELETAL:  No edema; No deformity  SKIN: Warm and dry LOWER EXTREMITIES: no swelling NEUROLOGIC:  Alert and oriented x 3 PSYCHIATRIC:  Normal affect   ASSESSMENT:    1. Dilated cardiomyopathy (HCC)   2. Essential hypertension   3. Paroxysmal atrial fibrillation (HCC)   4. Presence of automatic implantable cardioverter-defibrillator    PLAN:    In order of problems listed above:  1. Dilated cardiomyopathy and appropriate medication she can tolerate will continue. 2. Essential hypertension: Doing well from that point review well-controlled 3. Proximal mitral fibrillation: Anticoagulated her primary care physicians taking care of her INR. 4. ICD present she is scheduled to see Dr. Rudolpho Sevin next month and after that she would like to transfer her care to our office in [place].   Medication Adjustments/Labs and Tests Ordered: Current medicines are reviewed at length with the patient today.  Concerns regarding medicines are outlined above.  No orders of the defined types were placed in this encounter.  Medication changes: No orders of the defined types were placed in this encounter.   Signed, Georgeanna Lea, MD, [facility] 01/31/2018 1:53 PM    [facility]

## 2018-01-31 NOTE — Patient Instructions (Signed)

## 2018-02-04 DIAGNOSIS — M6281 Muscle weakness (generalized): Secondary | ICD-10-CM | POA: Diagnosis not present

## 2018-02-04 DIAGNOSIS — M545 Low back pain: Secondary | ICD-10-CM | POA: Diagnosis not present

## 2018-02-04 DIAGNOSIS — R2689 Other abnormalities of gait and mobility: Secondary | ICD-10-CM | POA: Diagnosis not present

## 2018-02-04 DIAGNOSIS — R2681 Unsteadiness on feet: Secondary | ICD-10-CM | POA: Diagnosis not present

## 2018-02-06 DIAGNOSIS — R2689 Other abnormalities of gait and mobility: Secondary | ICD-10-CM | POA: Diagnosis not present

## 2018-02-06 DIAGNOSIS — M545 Low back pain: Secondary | ICD-10-CM | POA: Diagnosis not present

## 2018-02-06 DIAGNOSIS — R2681 Unsteadiness on feet: Secondary | ICD-10-CM | POA: Diagnosis not present

## 2018-02-06 DIAGNOSIS — M6281 Muscle weakness (generalized): Secondary | ICD-10-CM | POA: Diagnosis not present

## 2018-02-10 DIAGNOSIS — R2689 Other abnormalities of gait and mobility: Secondary | ICD-10-CM | POA: Diagnosis not present

## 2018-02-10 DIAGNOSIS — R2681 Unsteadiness on feet: Secondary | ICD-10-CM | POA: Diagnosis not present

## 2018-02-10 DIAGNOSIS — M545 Low back pain: Secondary | ICD-10-CM | POA: Diagnosis not present

## 2018-02-10 DIAGNOSIS — M6281 Muscle weakness (generalized): Secondary | ICD-10-CM | POA: Diagnosis not present

## 2018-02-11 DIAGNOSIS — I5022 Chronic systolic (congestive) heart failure: Secondary | ICD-10-CM | POA: Diagnosis not present

## 2018-02-11 DIAGNOSIS — I70213 Atherosclerosis of native arteries of extremities with intermittent claudication, bilateral legs: Secondary | ICD-10-CM | POA: Diagnosis not present

## 2018-02-11 DIAGNOSIS — I420 Dilated cardiomyopathy: Secondary | ICD-10-CM | POA: Diagnosis not present

## 2018-02-11 DIAGNOSIS — I060 Rheumatic aortic stenosis: Secondary | ICD-10-CM | POA: Diagnosis not present

## 2018-02-11 DIAGNOSIS — I6523 Occlusion and stenosis of bilateral carotid arteries: Secondary | ICD-10-CM | POA: Diagnosis not present

## 2018-02-13 DIAGNOSIS — M545 Low back pain: Secondary | ICD-10-CM | POA: Diagnosis not present

## 2018-02-13 DIAGNOSIS — R2681 Unsteadiness on feet: Secondary | ICD-10-CM | POA: Diagnosis not present

## 2018-02-13 DIAGNOSIS — M6281 Muscle weakness (generalized): Secondary | ICD-10-CM | POA: Diagnosis not present

## 2018-02-13 DIAGNOSIS — R2689 Other abnormalities of gait and mobility: Secondary | ICD-10-CM | POA: Diagnosis not present

## 2018-02-17 ENCOUNTER — Other Ambulatory Visit: Payer: Self-pay

## 2018-02-17 ENCOUNTER — Telehealth: Payer: Self-pay | Admitting: Cardiology

## 2018-02-17 DIAGNOSIS — G4733 Obstructive sleep apnea (adult) (pediatric): Secondary | ICD-10-CM | POA: Diagnosis not present

## 2018-02-17 DIAGNOSIS — R5383 Other fatigue: Secondary | ICD-10-CM | POA: Diagnosis not present

## 2018-02-17 DIAGNOSIS — J4531 Mild persistent asthma with (acute) exacerbation: Secondary | ICD-10-CM | POA: Diagnosis not present

## 2018-02-17 DIAGNOSIS — J301 Allergic rhinitis due to pollen: Secondary | ICD-10-CM | POA: Diagnosis not present

## 2018-02-17 MED ORDER — CARVEDILOL 25 MG PO TABS: 25.0000 mg | ORAL_TABLET | Freq: Two times a day (BID) | ORAL | 6 refills | Status: DC

## 2018-02-17 NOTE — Telephone Encounter (Signed)
Marchelle Folks, CMA, sent in refill.

## 2018-02-17 NOTE — Telephone Encounter (Signed)
[facility] [address] [place]

## 2018-02-18 DIAGNOSIS — R2689 Other abnormalities of gait and mobility: Secondary | ICD-10-CM | POA: Diagnosis not present

## 2018-02-18 DIAGNOSIS — M545 Low back pain: Secondary | ICD-10-CM | POA: Diagnosis not present

## 2018-02-18 DIAGNOSIS — M6281 Muscle weakness (generalized): Secondary | ICD-10-CM | POA: Diagnosis not present

## 2018-02-18 DIAGNOSIS — R2681 Unsteadiness on feet: Secondary | ICD-10-CM | POA: Diagnosis not present

## 2018-02-20 DIAGNOSIS — R2689 Other abnormalities of gait and mobility: Secondary | ICD-10-CM | POA: Diagnosis not present

## 2018-02-20 DIAGNOSIS — R2681 Unsteadiness on feet: Secondary | ICD-10-CM | POA: Diagnosis not present

## 2018-02-20 DIAGNOSIS — M6281 Muscle weakness (generalized): Secondary | ICD-10-CM | POA: Diagnosis not present

## 2018-02-20 DIAGNOSIS — M545 Low back pain: Secondary | ICD-10-CM | POA: Diagnosis not present

## 2018-02-26 ENCOUNTER — Ambulatory Visit: Payer: Medicare Other | Admitting: Sports Medicine

## 2018-02-27 DIAGNOSIS — M545 Low back pain: Secondary | ICD-10-CM | POA: Diagnosis not present

## 2018-02-27 DIAGNOSIS — R2681 Unsteadiness on feet: Secondary | ICD-10-CM | POA: Diagnosis not present

## 2018-02-27 DIAGNOSIS — M6281 Muscle weakness (generalized): Secondary | ICD-10-CM | POA: Diagnosis not present

## 2018-02-27 DIAGNOSIS — R2689 Other abnormalities of gait and mobility: Secondary | ICD-10-CM | POA: Diagnosis not present

## 2018-02-28 DIAGNOSIS — Z7901 Long term (current) use of anticoagulants: Secondary | ICD-10-CM | POA: Diagnosis not present

## 2018-03-01 DIAGNOSIS — G4733 Obstructive sleep apnea (adult) (pediatric): Secondary | ICD-10-CM | POA: Diagnosis not present

## 2018-03-04 DIAGNOSIS — M545 Low back pain: Secondary | ICD-10-CM | POA: Diagnosis not present

## 2018-03-04 DIAGNOSIS — M6281 Muscle weakness (generalized): Secondary | ICD-10-CM | POA: Diagnosis not present

## 2018-03-04 DIAGNOSIS — R2689 Other abnormalities of gait and mobility: Secondary | ICD-10-CM | POA: Diagnosis not present

## 2018-03-04 DIAGNOSIS — R2681 Unsteadiness on feet: Secondary | ICD-10-CM | POA: Diagnosis not present

## 2018-03-06 DIAGNOSIS — M6281 Muscle weakness (generalized): Secondary | ICD-10-CM | POA: Diagnosis not present

## 2018-03-06 DIAGNOSIS — M545 Low back pain: Secondary | ICD-10-CM | POA: Diagnosis not present

## 2018-03-06 DIAGNOSIS — R2689 Other abnormalities of gait and mobility: Secondary | ICD-10-CM | POA: Diagnosis not present

## 2018-03-06 DIAGNOSIS — R2681 Unsteadiness on feet: Secondary | ICD-10-CM | POA: Diagnosis not present

## 2018-03-10 DIAGNOSIS — I420 Dilated cardiomyopathy: Secondary | ICD-10-CM | POA: Diagnosis not present

## 2018-03-13 DIAGNOSIS — R791 Abnormal coagulation profile: Secondary | ICD-10-CM | POA: Diagnosis not present

## 2018-03-27 DIAGNOSIS — E1140 Type 2 diabetes mellitus with diabetic neuropathy, unspecified: Secondary | ICD-10-CM | POA: Diagnosis not present

## 2018-03-27 DIAGNOSIS — E785 Hyperlipidemia, unspecified: Secondary | ICD-10-CM | POA: Diagnosis not present

## 2018-03-27 DIAGNOSIS — Z79899 Other long term (current) drug therapy: Secondary | ICD-10-CM | POA: Diagnosis not present

## 2018-03-28 DIAGNOSIS — R791 Abnormal coagulation profile: Secondary | ICD-10-CM | POA: Diagnosis not present

## 2018-03-31 DIAGNOSIS — R791 Abnormal coagulation profile: Secondary | ICD-10-CM | POA: Diagnosis not present

## 2018-04-01 DIAGNOSIS — G4733 Obstructive sleep apnea (adult) (pediatric): Secondary | ICD-10-CM | POA: Diagnosis not present

## 2018-04-02 ENCOUNTER — Ambulatory Visit: Payer: Medicare Other | Admitting: Sports Medicine

## 2018-04-04 DIAGNOSIS — L309 Dermatitis, unspecified: Secondary | ICD-10-CM | POA: Diagnosis not present

## 2018-04-04 DIAGNOSIS — L819 Disorder of pigmentation, unspecified: Secondary | ICD-10-CM | POA: Diagnosis not present

## 2018-04-07 DIAGNOSIS — R791 Abnormal coagulation profile: Secondary | ICD-10-CM | POA: Diagnosis not present

## 2018-04-14 DIAGNOSIS — G4733 Obstructive sleep apnea (adult) (pediatric): Secondary | ICD-10-CM | POA: Diagnosis not present

## 2018-04-14 DIAGNOSIS — J4530 Mild persistent asthma, uncomplicated: Secondary | ICD-10-CM | POA: Diagnosis not present

## 2018-04-15 DIAGNOSIS — R791 Abnormal coagulation profile: Secondary | ICD-10-CM | POA: Diagnosis not present

## 2018-04-25 ENCOUNTER — Encounter: Payer: Self-pay | Admitting: Cardiology

## 2018-04-25 ENCOUNTER — Ambulatory Visit (INDEPENDENT_AMBULATORY_CARE_PROVIDER_SITE_OTHER): Payer: Medicare Other | Admitting: Cardiology

## 2018-04-25 VITALS — BP 140/82 | HR 84 | Ht 71.0 in | Wt 351.6 lb

## 2018-04-25 DIAGNOSIS — I480 Paroxysmal atrial fibrillation: Secondary | ICD-10-CM

## 2018-04-25 DIAGNOSIS — R0609 Other forms of dyspnea: Secondary | ICD-10-CM

## 2018-04-25 DIAGNOSIS — Z952 Presence of prosthetic heart valve: Secondary | ICD-10-CM | POA: Diagnosis not present

## 2018-04-25 DIAGNOSIS — I5022 Chronic systolic (congestive) heart failure: Secondary | ICD-10-CM

## 2018-04-25 DIAGNOSIS — I739 Peripheral vascular disease, unspecified: Secondary | ICD-10-CM

## 2018-04-25 DIAGNOSIS — E785 Hyperlipidemia, unspecified: Secondary | ICD-10-CM

## 2018-04-25 NOTE — Progress Notes (Signed)
Cardiology Office Note:    Date:  04/25/2018   ID:  Shannon Baxter, DOB [birth year], MRN [id number]  PCP:  Paulina Fusi, MD  Cardiologist:  Shannon Balsam, MD    Referring MD: Paulina Fusi, MD   No chief complaint on file. Doing well still short of breath  History of Present Illness:    Shannon Baxter is a 57 y.o. female with status post aortic valve replacement with [facility] Jude prosthesis 23 mm, cardiomyopathy with previous ejection fraction 30% 9 improved to 3540, peripheral vascular disease followed by Dr. Garner Nash, ICD, presents to our office for follow-up overall she is doing well complaint of being weak and tired.  Her daughter got baby and she is very busy taking care of the baby.  Complained of having leg pain but no chest pain tightness squeezing pressure burning chest  Past Medical History:  Diagnosis Date  . Allergy   . Aortic insufficiency   . Aortic stenosis   . Arthritis   . Asthma   . CHF (congestive heart failure) (HCC)   . Claudication (HCC)   . Coronary artery disease   . Diabetes mellitus without complication (HCC)   . GERD (gastroesophageal reflux disease)   . Heart murmur   . Hyperlipidemia   . Hypertension   . Nutritional and metabolic cardiomyopathy (HCC)   . PVD (peripheral vascular disease) (HCC)   . Sleep apnea   . Stroke [facility])     Past Surgical History:  Procedure Laterality Date  . CARDIAC CATHETERIZATION    . INSERT / REPLACE / REMOVE PACEMAKER     Medtronic ICD  . MECHANICAL AORTIC VALVE REPLACEMENT    . PERIPHERAL ARTERIAL STENT GRAFT    . TUBAL LIGATION      Current Medications: Current Meds  Medication Sig  . acetaminophen (TYLENOL) 500 MG tablet Take 1,500 mg by mouth 2 (two) times daily as needed for pain.  Marland Kitchen ALPRAZolam (XANAX) 0.5 MG tablet 1 tablet nightly as needed for sleep  . atorvastatin (LIPITOR) 80 MG tablet Take 80 mg by mouth daily.  . beclomethasone (QVAR) 80 MCG/ACT inhaler Inhale into the lungs 2 (two) times  daily.  . carvedilol (COREG) 25 MG tablet Take 1 tablet (25 mg total) by mouth 2 (two) times daily.  . clopidogrel (PLAVIX) 75 MG tablet Take 75 mg by mouth daily.  . Cyanocobalamin (B-12) 500 MCG TABS Take 3 tablets by mouth daily.  Marland Kitchen diltiazem (CARDIZEM CD) 240 MG 24 hr capsule Take 240 mg by mouth daily.  Marland Kitchen ENTRESTO 97-103 MG Take 1 tablet by mouth 2 (two) times daily.  Marland Kitchen EPIPEN 2-PAK 0.3 MG/0.3ML SOAJ injection See admin instructions.  Marland Kitchen ezetimibe (ZETIA) 10 MG tablet Take 10 mg by mouth daily.  . fluticasone (FLONASE) 50 MCG/ACT nasal spray Place 2 sprays into both nostrils daily.   . furosemide (LASIX) 20 MG tablet Alternate 2 tablets daily with 1 tablet daily  . gabapentin (NEURONTIN) 300 MG capsule 1 (ONE) CAPSULE BY MOUTH THREE TIMES DAILY  . glimepiride (AMARYL) 4 MG tablet Take 4 mg by mouth daily.  . hydrALAZINE (APRESOLINE) 10 MG tablet Take 10 mg by mouth 3 (three) times daily.  Marland Kitchen ipratropium-albuterol (DUONEB) 0.5-2.5 (3) MG/3ML SOLN   . loratadine (CLARITIN) 10 MG tablet Take 10 mg by mouth daily.  . metFORMIN (GLUCOPHAGE) 500 MG tablet Take by mouth 2 (two) times daily with a meal.  . mometasone (ASMANEX) 220 MCG/INH inhaler Inhale 2 puffs into the lungs daily.  Marland Kitchen  montelukast (SINGULAIR) 10 MG tablet Take 10 mg by mouth daily.  . Multiple Vitamins-Minerals (CENTRUM SILVER ULTRA WOMENS) TABS Take 1 tablet by mouth daily.  . nitroGLYCERIN (NITROSTAT) 0.4 MG SL tablet Place 0.4 mg under the tongue every 5 (five) minutes as needed for chest pain.  Marland Kitchen omeprazole (PRILOSEC) 20 MG capsule   . Red Yeast Rice 600 MG TABS Take 2 tablets by mouth daily.  Marland Kitchen warfarin (COUMADIN) 1 MG tablet Take 1 mg by mouth as directed.  . warfarin (COUMADIN) 5 MG tablet Take 5 mg by mouth as directed.     Allergies:   Ioxaglate and Ivp dye [iodinated diagnostic agents]   Social History   Socioeconomic History  . Marital status: Single    Spouse name: Not on file  . Number of children: Not on  file  . Years of education: Not on file  . Highest education level: Not on file  Occupational History  . Not on file  Social Needs  . Financial resource strain: Not on file  . Food insecurity:    Worry: Not on file    Inability: Not on file  . Transportation needs:    Medical: Not on file    Non-medical: Not on file  Tobacco Use  . Smoking status: Former Games developer  . Smokeless tobacco: Never Used  Substance and Sexual Activity  . Alcohol use: No  . Drug use: No  . Sexual activity: Not on file  Lifestyle  . Physical activity:    Days per week: Not on file    Minutes per session: Not on file  . Stress: Not on file  Relationships  . Social connections:    Talks on phone: Not on file    Gets together: Not on file    Attends religious service: Not on file    Active member of club or organization: Not on file    Attends meetings of clubs or organizations: Not on file    Relationship status: Not on file  Other Topics Concern  . Not on file  Social History Narrative  . Not on file     Family History: The patient's family history includes Heart disease in her brother. ROS:   Please see the history of present illness.    All 14 point review of systems negative except as described per history of present illness  EKGs/Labs/Other Studies Reviewed:      Recent Labs: 12/31/2017: BUN 14; Creatinine, Ser 0.82; NT-Pro BNP 212; Potassium 4.0; Sodium 142  Recent Lipid Panel    Component Value Date/Time   CHOL  07/04/2007 0815    200        ATP III CLASSIFICATION:  <200     mg/dL   Desirable  257-505  mg/dL   Borderline High  >=183    mg/dL   High   TRIG 358 25/18/9842 0815   HDL 47 07/04/2007 0815   CHOLHDL 4.3 07/04/2007 0815   VLDL 28 07/04/2007 0815   LDLCALC (H) 07/04/2007 0815    125        Total Cholesterol/HDL:CHD Risk Coronary Heart Disease Risk Table                     Men   Women  1/2 Average Risk   3.4   3.3    Physical Exam:    VS:  BP 140/82 (BP  Location: Right Arm, Patient Position: Sitting, Cuff Size: Normal)   Pulse 84  Ht 5\' 11"  (1.803 m)   Wt (!) 351 lb 9.6 oz (159.5 kg)   SpO2 96%   BMI 49.04 kg/m     Wt Readings from Last 3 Encounters:  04/25/18 (!) 351 lb 9.6 oz (159.5 kg)  01/31/18 (!) 322 lb 1.9 oz (146.1 kg)  12/31/17 (!) 366 lb 6.4 oz (166.2 kg)     GEN:  Well nourished, well developed in no acute distress HEENT: Normal NECK: No JVD; No carotid bruits LYMPHATICS: No lymphadenopathy CARDIAC: RRR, no murmurs, no rubs, no gallops RESPIRATORY:  Clear to auscultation without rales, wheezing or rhonchi  ABDOMEN: Soft, non-tender, non-distended MUSCULOSKELETAL:  No edema; No deformity  SKIN: Warm and dry LOWER EXTREMITIES: no swelling NEUROLOGIC:  Alert and oriented x 3 PSYCHIATRIC:  Normal affect   ASSESSMENT:    1. Status post aortic valve replacement   2. Dyslipidemia   3. Paroxysmal atrial fibrillation (HCC)   4. Dyspnea on exertion   5. Peripheral vascular disease (HCC)   6. Chronic systolic congestive heart failure (HCC)    PLAN:    In order of problems listed above:  1. 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  He denies having any recent palpitations anticoagulated which I will continue Peripheral vascular disease recently seen by Dr. Garner Nash.  There was some conversation about potentially intervening on her legs but she does not want to do it I talked to her about exercises on the regular basis and build up her stamina. Chronic systolic congestive heart failure [place] Heart Association 2 and 3.  We will continue present medications.  Overall she is doing well I see her back in office in 3 to 4 months or sooner  if she has a problem  Medication Adjustments/Labs and Tests Ordered: Current medicines are reviewed at length with the patient today.  Concerns regarding medicines are outlined above.  No orders of the defined types were placed in this encounter.  Medication changes: No orders of the defined types were placed in this encounter.   Signed, Georgeanna Lea, MD, [facility] 04/25/2018 4:19 PM    [facility]

## 2018-04-25 NOTE — Patient Instructions (Signed)
Medication Instructions:  Your physician recommends that you continue on your current medications as directed. Please refer to the Current Medication list given to you today.   Labwork: None  Testing/Procedures: None  Follow-Up: Your physician recommends that you schedule a follow-up appointment in: 3 months  Any Other Special Instructions Will Be Listed Below (If Applicable).     If you need a refill on your cardiac medications before your next appointment, please call your pharmacy.   

## 2018-05-01 DIAGNOSIS — G4733 Obstructive sleep apnea (adult) (pediatric): Secondary | ICD-10-CM | POA: Diagnosis not present

## 2018-05-01 DIAGNOSIS — R791 Abnormal coagulation profile: Secondary | ICD-10-CM | POA: Diagnosis not present

## 2018-05-02 ENCOUNTER — Ambulatory Visit (INDEPENDENT_AMBULATORY_CARE_PROVIDER_SITE_OTHER): Payer: Medicare Other | Admitting: Sports Medicine

## 2018-05-02 ENCOUNTER — Encounter: Payer: Self-pay | Admitting: Sports Medicine

## 2018-05-02 DIAGNOSIS — E1140 Type 2 diabetes mellitus with diabetic neuropathy, unspecified: Secondary | ICD-10-CM | POA: Diagnosis not present

## 2018-05-02 DIAGNOSIS — M79674 Pain in right toe(s): Secondary | ICD-10-CM | POA: Diagnosis not present

## 2018-05-02 DIAGNOSIS — M79671 Pain in right foot: Secondary | ICD-10-CM

## 2018-05-02 DIAGNOSIS — R791 Abnormal coagulation profile: Secondary | ICD-10-CM | POA: Diagnosis not present

## 2018-05-02 DIAGNOSIS — M79675 Pain in left toe(s): Secondary | ICD-10-CM

## 2018-05-02 DIAGNOSIS — B351 Tinea unguium: Secondary | ICD-10-CM | POA: Diagnosis not present

## 2018-05-02 DIAGNOSIS — L600 Ingrowing nail: Secondary | ICD-10-CM

## 2018-05-02 DIAGNOSIS — IMO0002 Reserved for concepts with insufficient information to code with codable children: Secondary | ICD-10-CM

## 2018-05-02 DIAGNOSIS — E1165 Type 2 diabetes mellitus with hyperglycemia: Secondary | ICD-10-CM

## 2018-05-02 DIAGNOSIS — M79672 Pain in left foot: Secondary | ICD-10-CM

## 2018-05-02 NOTE — Progress Notes (Signed)
Subjective: Shannon Baxter is a 56 y.o. female patient with history of diabetes who presents to office today complaining of long, painful nails  while ambulating in shoes; unable to trim. Patient states that the left hallux medial border over the last 2 weeks has been swelling and has been very painful 7 out of 10 especially with direct pressure or touch states that she has tried Tylenol.  Patient reports her glucose reading this morning was 70 mg/dl.  Patient saw her primary care doctor a couple of weeks ago with last A1c recorded at 6.6.  Patient Active Problem List   Diagnosis Date Noted  . Chronic systolic congestive heart failure (HCC) 12/06/2016  . Morbid obesity (HCC) 12/06/2016  . Presence of automatic implantable cardioverter-defibrillator 10/23/2016  . Dilated cardiomyopathy (HCC) 09/03/2016  . Status post aortic valve replacement 03/29/2016  . Paroxysmal atrial fibrillation (HCC) 03/06/2016  . Rheumatic aortic valve insufficiency 03/05/2016  . Acute on chronic systolic heart failure (HCC) 02/21/2016  . Hypothyroidism 01/23/2016  . Aortic valve stenosis, rheumatic 11/22/2015  . Type 2 diabetes mellitus with diabetic peripheral angiopathy without gangrene, without long-term current use of insulin (HCC) 11/22/2015  . Dyspnea on exertion 10/26/2015  . Atherosclerosis of native arteries of extremities with intermittent claudication, bilateral legs (HCC) 08/04/2015  . Carotid atherosclerosis 07/26/2015  . Coronary artery disease involving native coronary artery of native heart without angina pectoris 07/26/2015  . Dyslipidemia 07/26/2015  . Essential hypertension 07/26/2015  . Intermittent claudication (HCC) 07/26/2015  . Peripheral vascular disease (HCC) 07/26/2015  . History of tobacco abuse 07/26/2015   Current Outpatient Medications on File Prior to Visit  Medication Sig Dispense Refill  . acetaminophen (TYLENOL) 500 MG tablet Take 1,500 mg by mouth 2 (two) times daily as needed for  pain.    Marland Kitchen ALPRAZolam (XANAX) 0.5 MG tablet 1 tablet nightly as needed for sleep  0  . atorvastatin (LIPITOR) 80 MG tablet Take 80 mg by mouth daily.    . beclomethasone (QVAR) 80 MCG/ACT inhaler Inhale into the lungs 2 (two) times daily.    . carvedilol (COREG) 25 MG tablet Take 1 tablet (25 mg total) by mouth 2 (two) times daily. 60 tablet 6  . clopidogrel (PLAVIX) 75 MG tablet Take 75 mg by mouth daily.  6  . Cyanocobalamin (B-12) 500 MCG TABS Take 3 tablets by mouth daily.    Marland Kitchen diltiazem (CARDIZEM CD) 240 MG 24 hr capsule Take 240 mg by mouth daily.    Marland Kitchen ENTRESTO 97-103 MG Take 1 tablet by mouth 2 (two) times daily.  11  . EPIPEN 2-PAK 0.3 MG/0.3ML SOAJ injection See admin instructions.  0  . ezetimibe (ZETIA) 10 MG tablet Take 10 mg by mouth daily.    . fluticasone (FLONASE) 50 MCG/ACT nasal spray Place 2 sprays into both nostrils daily.     . furosemide (LASIX) 20 MG tablet Alternate 2 tablets daily with 1 tablet daily 180 tablet 3  . gabapentin (NEURONTIN) 300 MG capsule 1 (ONE) CAPSULE BY MOUTH THREE TIMES DAILY  5  . glimepiride (AMARYL) 4 MG tablet Take 4 mg by mouth daily.    . hydrALAZINE (APRESOLINE) 10 MG tablet Take 10 mg by mouth 3 (three) times daily.    Marland Kitchen ipratropium-albuterol (DUONEB) 0.5-2.5 (3) MG/3ML SOLN     . loratadine (CLARITIN) 10 MG tablet Take 10 mg by mouth daily.    . metFORMIN (GLUCOPHAGE) 500 MG tablet Take by mouth 2 (two) times daily with a meal.    .  mometasone (ASMANEX) 220 MCG/INH inhaler Inhale 2 puffs into the lungs daily.    . montelukast (SINGULAIR) 10 MG tablet Take 10 mg by mouth daily.    . Multiple Vitamins-Minerals (CENTRUM SILVER ULTRA WOMENS) TABS Take 1 tablet by mouth daily.    . nitroGLYCERIN (NITROSTAT) 0.4 MG SL tablet Place 0.4 mg under the tongue every 5 (five) minutes as needed for chest pain.    Marland Kitchen omeprazole (PRILOSEC) 20 MG capsule     . Red Yeast Rice 600 MG TABS Take 2 tablets by mouth daily.    Marland Kitchen warfarin (COUMADIN) 1 MG tablet  Take 1 mg by mouth as directed.    . warfarin (COUMADIN) 5 MG tablet Take 5 mg by mouth as directed.     No current facility-administered medications on file prior to visit.    Allergies  Allergen Reactions  . Ioxaglate Hives  . Ivp Dye [Iodinated Diagnostic Agents]     No results found for this or any previous visit (from the past 2160 hour(s)).  Objective: General: Patient is awake, alert, and oriented x 3 and in no acute distress.  Integument: Skin is warm, dry and supple bilateral. Nails are tender, long, thickened and  dystrophic with subungual debris, consistent with onychomycosis, 1-5 bilateral.  At the left great toe there is incurvation at the medial border with swelling and discomfort with palpation consistent with ingrown toenail.  There is no active drainage, no warmth, no redness or any other acute signs of infection focal to the left hallux medial nail fold. No open lesions or preulcerative lesions present bilateral. Remaining integument unremarkable.  Vasculature:  Dorsalis Pedis pulse 1/4 bilateral. Posterior Tibial pulse  0/4 bilateral.  Capillary fill time <3 sec 1-5 bilateral. Positive hair growth to the level of the digits. Temperature gradient within normal limits. No varicosities present bilateral. No edema present bilateral.   Neurology: The patient has intact sensation measured with a 5.07/10g Semmes Weinstein Monofilament at all pedal sites bilateral . Vibratory sensation diminished bilateral with tuning fork. No Babinski sign present bilateral.   Musculoskeletal: There is tenderness to the left hallux medial nail fold.  Asymptomatic pes planus pedal deformities noted bilateral. Muscular strength 5/5 in all lower extremity muscular groups bilateral without pain on range of motion except at midfoot where there is arthritis. No tenderness with calf compression bilateral.  Assessment and Plan: Problem List Items Addressed This Visit    None    Visit Diagnoses     Ingrown nail    -  Primary   Pain due to onychomycosis of toenails of both feet       Type 2 diabetes, uncontrolled, with neuropathy (HCC)       Foot pain, bilateral           -Examined patient. -Discussed and educated patient on diabetic foot care, especially with  regards to the vascular, neurological and musculoskeletal systems.  -Stressed the importance of good glycemic control and the detriment of not controlling glucose levels in relation to the foot. -Mechanically debrided all nails 1-5 on right and 2 through 5 on the left using sterile nail nipper and filed with dremel without incident  Discussed treatment alternatives and plan of care; Explained left hallux medial permanent nail avulsion and post procedure course to patient. - After a verbal and written consent, injected 3 ml of a 50:50 mixture of 2% plain  lidocaine and 0.5% plain marcaine in a normal hallux block fashion. Next, a  betadine prep was  performed. Anesthesia was tested and found to be appropriate.  The offending left hallux medial nail border was then incised from the hyponychium to the epinychium. The offending nail border was removed and cleared from the field. The area was curretted for any remaining nail or spicules. Phenol application performed and the area was then flushed with alcohol and dressed with antibiotic cream and a dry sterile dressing. -Patient was instructed to leave the dressing intact for today and begin soaking  in a weak solution of betadine and water tomorrow. Patient was instructed to  soak for 15 minutes each day and apply neosporin and a gauze or bandaid dressing each day. -Patient was instructed to monitor the toe for signs of infection and return to office if toe becomes red, hot or swollen. -Patient advised to call the office if any problems or questions arise in the meantime. -Patient to return to office in 2 weeks for nail check.  [name], DPM

## 2018-05-02 NOTE — Patient Instructions (Signed)

## 2018-05-05 DIAGNOSIS — R791 Abnormal coagulation profile: Secondary | ICD-10-CM | POA: Diagnosis not present

## 2018-05-12 DIAGNOSIS — R791 Abnormal coagulation profile: Secondary | ICD-10-CM | POA: Diagnosis not present

## 2018-05-16 ENCOUNTER — Ambulatory Visit (INDEPENDENT_AMBULATORY_CARE_PROVIDER_SITE_OTHER): Payer: Medicare Other | Admitting: Sports Medicine

## 2018-05-16 ENCOUNTER — Encounter: Payer: Self-pay | Admitting: Sports Medicine

## 2018-05-16 DIAGNOSIS — E1140 Type 2 diabetes mellitus with diabetic neuropathy, unspecified: Secondary | ICD-10-CM

## 2018-05-16 DIAGNOSIS — E1165 Type 2 diabetes mellitus with hyperglycemia: Secondary | ICD-10-CM

## 2018-05-16 DIAGNOSIS — R791 Abnormal coagulation profile: Secondary | ICD-10-CM | POA: Diagnosis not present

## 2018-05-16 DIAGNOSIS — L600 Ingrowing nail: Secondary | ICD-10-CM

## 2018-05-16 DIAGNOSIS — IMO0002 Reserved for concepts with insufficient information to code with codable children: Secondary | ICD-10-CM

## 2018-05-16 DIAGNOSIS — Z9889 Other specified postprocedural states: Secondary | ICD-10-CM

## 2018-05-16 NOTE — Progress Notes (Signed)
Subjective: Shannon Baxter is a 57 y.o. female patient returns to office today for follow up evaluation after having left hallux medial permanent nail avulsion performed on 05-02-18. Patient has been soaking using epsom salt and applying topical antibiotic covered with bandaid daily. Patient denies fever/chills/ new nausea/vomitting/any other related constitutional symptoms at this time.  Patient Active Problem List   Diagnosis Date Noted  . Chronic systolic congestive heart failure (HCC) 12/06/2016  . Morbid obesity (HCC) 12/06/2016  . Presence of automatic implantable cardioverter-defibrillator 10/23/2016  . Dilated cardiomyopathy (HCC) 09/03/2016  . Status post aortic valve replacement 03/29/2016  . Paroxysmal atrial fibrillation (HCC) 03/06/2016  . Rheumatic aortic valve insufficiency 03/05/2016  . Acute on chronic systolic heart failure (HCC) 02/21/2016  . Hypothyroidism 01/23/2016  . Aortic valve stenosis, rheumatic 11/22/2015  . Type 2 diabetes mellitus with diabetic peripheral angiopathy without gangrene, without long-term current use of insulin (HCC) 11/22/2015  . Dyspnea on exertion 10/26/2015  . Atherosclerosis of native arteries of extremities with intermittent claudication, bilateral legs (HCC) 08/04/2015  . Carotid atherosclerosis 07/26/2015  . Coronary artery disease involving native coronary artery of native heart without angina pectoris 07/26/2015  . Dyslipidemia 07/26/2015  . Essential hypertension 07/26/2015  . Intermittent claudication (HCC) 07/26/2015  . Peripheral vascular disease (HCC) 07/26/2015  . History of tobacco abuse 07/26/2015    Current Outpatient Medications on File Prior to Visit  Medication Sig Dispense Refill  . acetaminophen (TYLENOL) 500 MG tablet Take 1,500 mg by mouth 2 (two) times daily as needed for pain.    Marland Kitchen ALPRAZolam (XANAX) 0.5 MG tablet 1 tablet nightly as needed for sleep  0  . atorvastatin (LIPITOR) 80 MG tablet Take 80 mg by mouth daily.     . beclomethasone (QVAR) 80 MCG/ACT inhaler Inhale into the lungs 2 (two) times daily.    . carvedilol (COREG) 25 MG tablet Take 1 tablet (25 mg total) by mouth 2 (two) times daily. 60 tablet 6  . clopidogrel (PLAVIX) 75 MG tablet Take 75 mg by mouth daily.  6  . Cyanocobalamin (B-12) 500 MCG TABS Take 3 tablets by mouth daily.    Marland Kitchen diltiazem (CARDIZEM CD) 240 MG 24 hr capsule Take 240 mg by mouth daily.    Marland Kitchen ENTRESTO 97-103 MG Take 1 tablet by mouth 2 (two) times daily.  11  . EPIPEN 2-PAK 0.3 MG/0.3ML SOAJ injection See admin instructions.  0  . ezetimibe (ZETIA) 10 MG tablet Take 10 mg by mouth daily.    . fluticasone (FLONASE) 50 MCG/ACT nasal spray Place 2 sprays into both nostrils daily.     . furosemide (LASIX) 20 MG tablet Alternate 2 tablets daily with 1 tablet daily 180 tablet 3  . gabapentin (NEURONTIN) 300 MG capsule 1 (ONE) CAPSULE BY MOUTH THREE TIMES DAILY  5  . glimepiride (AMARYL) 4 MG tablet Take 4 mg by mouth daily.    . hydrALAZINE (APRESOLINE) 10 MG tablet Take 10 mg by mouth 3 (three) times daily.    Marland Kitchen ipratropium-albuterol (DUONEB) 0.5-2.5 (3) MG/3ML SOLN     . loratadine (CLARITIN) 10 MG tablet Take 10 mg by mouth daily.    . metFORMIN (GLUCOPHAGE) 500 MG tablet Take by mouth 2 (two) times daily with a meal.    . mometasone (ASMANEX) 220 MCG/INH inhaler Inhale 2 puffs into the lungs daily.    . montelukast (SINGULAIR) 10 MG tablet Take 10 mg by mouth daily.    . Multiple Vitamins-Minerals (CENTRUM SILVER ULTRA WOMENS) TABS  Take 1 tablet by mouth daily.    . nitroGLYCERIN (NITROSTAT) 0.4 MG SL tablet Place 0.4 mg under the tongue every 5 (five) minutes as needed for chest pain.    Marland Kitchen omeprazole (PRILOSEC) 20 MG capsule     . Red Yeast Rice 600 MG TABS Take 2 tablets by mouth daily.    Marland Kitchen warfarin (COUMADIN) 1 MG tablet Take 1 mg by mouth as directed.    . warfarin (COUMADIN) 5 MG tablet Take 5 mg by mouth as directed.     No current facility-administered medications on  file prior to visit.     Allergies  Allergen Reactions  . Ioxaglate Hives  . Ivp Dye [Iodinated Diagnostic Agents]     Objective:  General: Well developed, nourished, in no acute distress, alert and oriented x3   Dermatology: Skin is warm, dry and supple bilateral.  Left hallux medial nail bed appears to be clean, dry, with mild granular tissue and surrounding eschar/scab. (-) Erythema. (-) Edema. (-) serosanguous drainage present. (+) Maceration present.  The remaining nails appear unremarkable at this time. There are no other lesions or other signs of infection  present.  Neurovascular status: Intact.  No acute changes from prior.  No lower extremity swelling; No pain with calf compression bilateral.  Musculoskeletal: Decreased tenderness to palpation of the left hallux medial nail fold.  Muscular strength within normal limits bilateral.   Assesement and Plan: Problem List Items Addressed This Visit    None    Visit Diagnoses    S/P nail surgery    -  Primary   Ingrown nail       Type 2 diabetes, uncontrolled, with neuropathy (HCC)          -Examined patient  -Cleansed left hallux medial nail fold and gently scrubbed with peroxide and q-tip/curetted away eschar at site and applied antibiotic cream covered with bandaid.  -Discussed plan of care with patient. -Patient to now begin soaking in a weak solution of Epsom salt and warm water once a day. Patient was instructed to soak for 15-20 minutes each day until the toe appears normal and there is no drainage, redness, tenderness, or swelling at the procedure site, and apply neosporin and a gauze or bandaid dressing each day as needed. May leave open to air at night. -Educated patient on long term care after nail surgery. -Advised patient for swimming most with swimming shoes and keep area covered and protected while swimming and if area worsens to call office or go to ER if toe becomes red, hot or swollen. -Patient is to return as  scheduled for routine diabetic foot care or sooner if problems arise.  [name], DPM

## 2018-05-22 DIAGNOSIS — G4733 Obstructive sleep apnea (adult) (pediatric): Secondary | ICD-10-CM | POA: Diagnosis not present

## 2018-05-26 DIAGNOSIS — R791 Abnormal coagulation profile: Secondary | ICD-10-CM | POA: Diagnosis not present

## 2018-05-26 DIAGNOSIS — J301 Allergic rhinitis due to pollen: Secondary | ICD-10-CM | POA: Diagnosis not present

## 2018-05-26 DIAGNOSIS — B370 Candidal stomatitis: Secondary | ICD-10-CM | POA: Diagnosis not present

## 2018-05-26 DIAGNOSIS — G4733 Obstructive sleep apnea (adult) (pediatric): Secondary | ICD-10-CM | POA: Diagnosis not present

## 2018-05-26 DIAGNOSIS — J4530 Mild persistent asthma, uncomplicated: Secondary | ICD-10-CM | POA: Diagnosis not present

## 2018-05-30 DIAGNOSIS — R791 Abnormal coagulation profile: Secondary | ICD-10-CM | POA: Diagnosis not present

## 2018-05-30 DIAGNOSIS — Z1231 Encounter for screening mammogram for malignant neoplasm of breast: Secondary | ICD-10-CM | POA: Diagnosis not present

## 2018-06-03 DIAGNOSIS — Z4502 Encounter for adjustment and management of automatic implantable cardiac defibrillator: Secondary | ICD-10-CM | POA: Diagnosis not present

## 2018-06-04 DIAGNOSIS — M1611 Unilateral primary osteoarthritis, right hip: Secondary | ICD-10-CM | POA: Diagnosis not present

## 2018-06-04 DIAGNOSIS — M4316 Spondylolisthesis, lumbar region: Secondary | ICD-10-CM | POA: Diagnosis not present

## 2018-06-06 DIAGNOSIS — M952 Other acquired deformity of head: Secondary | ICD-10-CM | POA: Diagnosis not present

## 2018-06-06 DIAGNOSIS — M791 Myalgia, unspecified site: Secondary | ICD-10-CM | POA: Diagnosis not present

## 2018-06-06 DIAGNOSIS — R791 Abnormal coagulation profile: Secondary | ICD-10-CM | POA: Diagnosis not present

## 2018-06-09 ENCOUNTER — Ambulatory Visit (INDEPENDENT_AMBULATORY_CARE_PROVIDER_SITE_OTHER): Payer: Medicare Other | Admitting: Podiatry

## 2018-06-09 DIAGNOSIS — Z6841 Body Mass Index (BMI) 40.0 and over, adult: Secondary | ICD-10-CM

## 2018-06-09 DIAGNOSIS — Z9889 Other specified postprocedural states: Secondary | ICD-10-CM

## 2018-06-09 DIAGNOSIS — E1151 Type 2 diabetes mellitus with diabetic peripheral angiopathy without gangrene: Secondary | ICD-10-CM

## 2018-06-09 DIAGNOSIS — L600 Ingrowing nail: Secondary | ICD-10-CM

## 2018-06-09 MED ORDER — CEPHALEXIN 500 MG PO CAPS: 500.0000 mg | ORAL_CAPSULE | Freq: Three times a day (TID) | ORAL | 0 refills | Status: DC

## 2018-06-09 NOTE — Progress Notes (Signed)
Subjective:  Patient ID: Shannon Baxter, female    DOB: 1961-05-20,  MRN: [id number]  Chief Complaint  Patient presents with  . nail check    F/U L hallux medial border Pt. stated," Last week was doing worst w/ lots of pain, today it looks somwhat better." Tx: epsom salt soaking -w/ brown discharge, redness and swelling    57 y.o. female presents with the above complaint. Had permanent nail excision 05-02-18. Having redness, swelling, and brown drainage.  Review of Systems: Negative except as noted in the HPI. Denies N/V/F/Ch.  Past Medical History:  Diagnosis Date  . Allergy   . Aortic insufficiency   . Aortic stenosis   . Arthritis   . Asthma   . CHF (congestive heart failure) (HCC)   . Claudication (HCC)   . Coronary artery disease   . Diabetes mellitus without complication (HCC)   . GERD (gastroesophageal reflux disease)   . Heart murmur   . Hyperlipidemia   . Hypertension   . Nutritional and metabolic cardiomyopathy (HCC)   . PVD (peripheral vascular disease) (HCC)   . Sleep apnea   . Stroke [facility])     Current Outpatient Medications:  .  acetaminophen (TYLENOL) 500 MG tablet, Take 1,500 mg by mouth 2 (two) times daily as needed for pain., Disp: , Rfl:  .  ALPRAZolam (XANAX) 0.5 MG tablet, 1 tablet nightly as needed for sleep, Disp: , Rfl: 0 .  atorvastatin (LIPITOR) 80 MG tablet, Take 80 mg by mouth daily., Disp: , Rfl:  .  beclomethasone (QVAR) 80 MCG/ACT inhaler, Inhale into the lungs 2 (two) times daily., Disp: , Rfl:  .  clopidogrel (PLAVIX) 75 MG tablet, Take 75 mg by mouth daily., Disp: , Rfl: 6 .  Cyanocobalamin (B-12) 500 MCG TABS, Take 3 tablets by mouth daily., Disp: , Rfl:  .  diltiazem (CARDIZEM CD) 240 MG 24 hr capsule, Take 240 mg by mouth daily., Disp: , Rfl:  .  ENTRESTO 97-103 MG, Take 1 tablet by mouth 2 (two) times daily., Disp: , Rfl: 11 .  EPIPEN 2-PAK 0.3 MG/0.3ML SOAJ injection, See admin instructions., Disp: , Rfl: 0 .  ezetimibe (ZETIA) 10 MG  tablet, Take 10 mg by mouth daily., Disp: , Rfl:  .  fluticasone (FLONASE) 50 MCG/ACT nasal spray, Place 2 sprays into both nostrils daily. , Disp: , Rfl:  .  furosemide (LASIX) 20 MG tablet, Alternate 2 tablets daily with 1 tablet daily, Disp: 180 tablet, Rfl: 3 .  gabapentin (NEURONTIN) 300 MG capsule, 1 (ONE) CAPSULE BY MOUTH THREE TIMES DAILY, Disp: , Rfl: 5 .  glimepiride (AMARYL) 4 MG tablet, Take 4 mg by mouth daily., Disp: , Rfl:  .  ipratropium-albuterol (DUONEB) 0.5-2.5 (3) MG/3ML SOLN, , Disp: , Rfl:  .  loratadine (CLARITIN) 10 MG tablet, Take 10 mg by mouth daily., Disp: , Rfl:  .  metFORMIN (GLUCOPHAGE) 500 MG tablet, Take by mouth 2 (two) times daily with a meal., Disp: , Rfl:  .  mometasone (ASMANEX) 220 MCG/INH inhaler, Inhale 2 puffs into the lungs daily., Disp: , Rfl:  .  montelukast (SINGULAIR) 10 MG tablet, Take 10 mg by mouth daily., Disp: , Rfl:  .  Multiple Vitamins-Minerals (CENTRUM SILVER ULTRA WOMENS) TABS, Take 1 tablet by mouth daily., Disp: , Rfl:  .  nitroGLYCERIN (NITROSTAT) 0.4 MG SL tablet, Place 0.4 mg under the tongue every 5 (five) minutes as needed for chest pain., Disp: , Rfl:  .  omeprazole (PRILOSEC) 20  MG capsule, , Disp: , Rfl:  .  Red Yeast Rice 600 MG TABS, Take 2 tablets by mouth daily., Disp: , Rfl:  .  warfarin (COUMADIN) 1 MG tablet, Take 1 mg by mouth as directed., Disp: , Rfl:  .  warfarin (COUMADIN) 5 MG tablet, Take 5 mg by mouth as directed., Disp: , Rfl:  .  carvedilol (COREG) 25 MG tablet, Take 1 tablet (25 mg total) by mouth 2 (two) times daily., Disp: 60 tablet, Rfl: 6 .  cephALEXin (KEFLEX) 500 MG capsule, Take 1 capsule (500 mg total) by mouth 3 (three) times daily., Disp: 21 capsule, Rfl: 0 .  hydrALAZINE (APRESOLINE) 10 MG tablet, Take 10 mg by mouth 3 (three) times daily., Disp: , Rfl:   Social History   Tobacco Use  Smoking Status Former Smoker  Smokeless Tobacco Never Used    Allergies  Allergen Reactions  . Ioxaglate Hives   . Ivp Dye [Iodinated Diagnostic Agents]    Objective:  There were no vitals filed for this visit. There is no height or weight on file to calculate BMI. Constitutional Well developed. Well nourished.  Vascular Dorsalis pedis pulses palpable bilaterally. Posterior tibial pulses palpable bilaterally. Capillary refill normal to all digits.  No cyanosis or clubbing noted. Pedal hair growth normal.  Neurologic Normal speech. Oriented to person, place, and time. Epicritic sensation to light touch grossly present bilaterally.  Dermatologic Nails well groomed and normal in appearance. L hallux avulsion site with slight purulence, hemorrhagic crust. Skin with dark staining but no warmth or active erythema.  Orthopedic: Normal joint ROM without pain or crepitus bilaterally. No visible deformities. No bony tenderness. Pain to palpation medial border L hallux.   Radiographs: None Assessment:   1. Ingrown nail   2. S/P nail surgery   3. Type 2 diabetes mellitus with diabetic peripheral angiopathy without gangrene, without long-term current use of insulin (HCC)   4. BMI 45.0-49.9, adult (HCC)   5. Morbid obesity (HCC)    Plan:  Patient was evaluated and treated and all questions answered.  S/p L Ingrown Nail Removal -Slight delayed healing noted. -Slight purulent drainage today. -Rx Keflex due to purulence. Educated on signs of worsening and advised to return promptly for re-eval. -Continue soaking. -Nail bed gently curettaged. No residual nail noted. -F/u with Dr. Marylene Land in 1 week should nail not improve.  Return in about 1 week (around 06/16/2018).

## 2018-06-19 ENCOUNTER — Encounter: Payer: Self-pay | Admitting: Sports Medicine

## 2018-06-19 ENCOUNTER — Ambulatory Visit (INDEPENDENT_AMBULATORY_CARE_PROVIDER_SITE_OTHER): Payer: Medicare Other | Admitting: Sports Medicine

## 2018-06-19 DIAGNOSIS — E1165 Type 2 diabetes mellitus with hyperglycemia: Secondary | ICD-10-CM | POA: Diagnosis not present

## 2018-06-19 DIAGNOSIS — L600 Ingrowing nail: Secondary | ICD-10-CM | POA: Diagnosis not present

## 2018-06-19 DIAGNOSIS — Z9889 Other specified postprocedural states: Secondary | ICD-10-CM

## 2018-06-19 DIAGNOSIS — IMO0002 Reserved for concepts with insufficient information to code with codable children: Secondary | ICD-10-CM

## 2018-06-19 DIAGNOSIS — E1140 Type 2 diabetes mellitus with diabetic neuropathy, unspecified: Secondary | ICD-10-CM

## 2018-06-19 MED ORDER — NEOMYCIN-POLYMYXIN-HC 3.5-10000-1 OT SOLN: OTIC | 0 refills | Status: DC

## 2018-06-19 NOTE — Progress Notes (Signed)
Subjective: Shannon Baxter is a 57 y.o. female patient returns to office today for follow up evaluation after having left hallux medial permanent nail avulsion performed on 05-02-18. Patient has finished off her antibiotics and reports that the area is looking a little better however there is still soreness 4 out of 10.  Patient has been soaking using epsom salt and applying topical antibiotic covered with bandaid daily. Patient denies fever/chills/ new nausea/vomitting/any other related constitutional symptoms at this time.  Patient Active Problem List   Diagnosis Date Noted  . Chronic systolic congestive heart failure (HCC) 12/06/2016  . Morbid obesity (HCC) 12/06/2016  . Presence of automatic implantable cardioverter-defibrillator 10/23/2016  . Dilated cardiomyopathy (HCC) 09/03/2016  . Status post aortic valve replacement 03/29/2016  . Paroxysmal atrial fibrillation (HCC) 03/06/2016  . Rheumatic aortic valve insufficiency 03/05/2016  . Acute on chronic systolic heart failure (HCC) 02/21/2016  . Hypothyroidism 01/23/2016  . Aortic valve stenosis, rheumatic 11/22/2015  . Type 2 diabetes mellitus with diabetic peripheral angiopathy without gangrene, without long-term current use of insulin (HCC) 11/22/2015  . Dyspnea on exertion 10/26/2015  . Atherosclerosis of native arteries of extremities with intermittent claudication, bilateral legs (HCC) 08/04/2015  . Carotid atherosclerosis 07/26/2015  . Coronary artery disease involving native coronary artery of native heart without angina pectoris 07/26/2015  . Dyslipidemia 07/26/2015  . Essential hypertension 07/26/2015  . Intermittent claudication (HCC) 07/26/2015  . Peripheral vascular disease (HCC) 07/26/2015  . History of tobacco abuse 07/26/2015    Current Outpatient Medications on File Prior to Visit  Medication Sig Dispense Refill  . acetaminophen (TYLENOL) 500 MG tablet Take 1,500 mg by mouth 2 (two) times daily as needed for pain.    Marland Kitchen  ALPRAZolam (XANAX) 0.5 MG tablet 1 tablet nightly as needed for sleep  0  . atorvastatin (LIPITOR) 80 MG tablet Take 80 mg by mouth daily.    . beclomethasone (QVAR) 80 MCG/ACT inhaler Inhale into the lungs 2 (two) times daily.    . cephALEXin (KEFLEX) 500 MG capsule Take 1 capsule (500 mg total) by mouth 3 (three) times daily. 21 capsule 0  . clopidogrel (PLAVIX) 75 MG tablet Take 75 mg by mouth daily.  6  . Cyanocobalamin (B-12) 500 MCG TABS Take 3 tablets by mouth daily.    Marland Kitchen diltiazem (CARDIZEM CD) 240 MG 24 hr capsule Take 240 mg by mouth daily.    Marland Kitchen ENTRESTO 97-103 MG Take 1 tablet by mouth 2 (two) times daily.  11  . EPIPEN 2-PAK 0.3 MG/0.3ML SOAJ injection See admin instructions.  0  . ezetimibe (ZETIA) 10 MG tablet Take 10 mg by mouth daily.    . fluticasone (FLONASE) 50 MCG/ACT nasal spray Place 2 sprays into both nostrils daily.     . furosemide (LASIX) 20 MG tablet Alternate 2 tablets daily with 1 tablet daily 180 tablet 3  . gabapentin (NEURONTIN) 300 MG capsule 1 (ONE) CAPSULE BY MOUTH THREE TIMES DAILY  5  . glimepiride (AMARYL) 4 MG tablet Take 4 mg by mouth daily.    Marland Kitchen ipratropium-albuterol (DUONEB) 0.5-2.5 (3) MG/3ML SOLN     . loratadine (CLARITIN) 10 MG tablet Take 10 mg by mouth daily.    . metFORMIN (GLUCOPHAGE) 500 MG tablet Take by mouth 2 (two) times daily with a meal.    . mometasone (ASMANEX) 220 MCG/INH inhaler Inhale 2 puffs into the lungs daily.    . montelukast (SINGULAIR) 10 MG tablet Take 10 mg by mouth daily.    Marland Kitchen  Multiple Vitamins-Minerals (CENTRUM SILVER ULTRA WOMENS) TABS Take 1 tablet by mouth daily.    . nitroGLYCERIN (NITROSTAT) 0.4 MG SL tablet Place 0.4 mg under the tongue every 5 (five) minutes as needed for chest pain.    Marland Kitchen omeprazole (PRILOSEC) 20 MG capsule     . Red Yeast Rice 600 MG TABS Take 2 tablets by mouth daily.    Marland Kitchen warfarin (COUMADIN) 1 MG tablet Take 1 mg by mouth as directed.    . warfarin (COUMADIN) 5 MG tablet Take 5 mg by mouth as  directed.    . carvedilol (COREG) 25 MG tablet Take 1 tablet (25 mg total) by mouth 2 (two) times daily. 60 tablet 6  . hydrALAZINE (APRESOLINE) 10 MG tablet Take 10 mg by mouth 3 (three) times daily.     No current facility-administered medications on file prior to visit.     Allergies  Allergen Reactions  . Ioxaglate Hives  . Ivp Dye [Iodinated Diagnostic Agents]     Objective:  General: Well developed, nourished, in no acute distress, alert and oriented x3   Dermatology: Skin is warm, dry and supple bilateral.  Left hallux medial nail bed appears to be clean, dry, with mild fibrogranular tissue and surrounding eschar/scab. (-) Erythema. (-) Edema. (-) serosanguous drainage present.  Minimal maceration present.  The remaining nails appear unremarkable at this time. There are no other lesions or other signs of infection  present.  Neurovascular status: Intact.  No acute changes from prior.  No lower extremity swelling; No pain with calf compression bilateral.  Musculoskeletal: Decreased tenderness to palpation of the left hallux medial nail fold.  Muscular strength within normal limits bilateral.   Assesement and Plan: Problem List Items Addressed This Visit    None    Visit Diagnoses    S/P nail surgery    -  Primary   Ingrown nail       Type 2 diabetes, uncontrolled, with neuropathy (HCC)          -Examined patient  -Cleansed left hallux medial nail fold and gently scrubbed with peroxide and q-tip/curetted away eschar at site and applied antibiotic cream covered with bandaid.  -Discussed plan of care with patient of slow healing left great toenail procedure site -Patient to now begin soaking in a weak solution of Epsom salt and warm water once a day and use Corticosporin solution twice daily as prescribed. Patient was instructed to soak for 15-20 minutes each day until the toe appears normal and there is no drainage, redness, tenderness, or swelling at the procedure site.   Encourage patient at night maceration -Educated patient on long term care after nail surgery. -Advised patient not healed at next visit we will order ABIs for further evaluation -Patient is to return weeks for follow-up nail check. [name], DPM

## 2018-06-20 DIAGNOSIS — R791 Abnormal coagulation profile: Secondary | ICD-10-CM | POA: Diagnosis not present

## 2018-07-01 DIAGNOSIS — E1140 Type 2 diabetes mellitus with diabetic neuropathy, unspecified: Secondary | ICD-10-CM | POA: Diagnosis not present

## 2018-07-01 DIAGNOSIS — I2510 Atherosclerotic heart disease of native coronary artery without angina pectoris: Secondary | ICD-10-CM | POA: Diagnosis not present

## 2018-07-01 DIAGNOSIS — E785 Hyperlipidemia, unspecified: Secondary | ICD-10-CM | POA: Diagnosis not present

## 2018-07-01 DIAGNOSIS — I10 Essential (primary) hypertension: Secondary | ICD-10-CM | POA: Diagnosis not present

## 2018-07-01 DIAGNOSIS — R791 Abnormal coagulation profile: Secondary | ICD-10-CM | POA: Diagnosis not present

## 2018-07-02 DIAGNOSIS — G4733 Obstructive sleep apnea (adult) (pediatric): Secondary | ICD-10-CM | POA: Diagnosis not present

## 2018-07-04 DIAGNOSIS — Z79899 Other long term (current) drug therapy: Secondary | ICD-10-CM | POA: Diagnosis not present

## 2018-07-04 DIAGNOSIS — E1140 Type 2 diabetes mellitus with diabetic neuropathy, unspecified: Secondary | ICD-10-CM | POA: Diagnosis not present

## 2018-07-04 DIAGNOSIS — E785 Hyperlipidemia, unspecified: Secondary | ICD-10-CM | POA: Diagnosis not present

## 2018-07-10 ENCOUNTER — Ambulatory Visit (INDEPENDENT_AMBULATORY_CARE_PROVIDER_SITE_OTHER): Payer: Medicare Other | Admitting: Sports Medicine

## 2018-07-10 ENCOUNTER — Encounter: Payer: Self-pay | Admitting: Sports Medicine

## 2018-07-10 DIAGNOSIS — Z9889 Other specified postprocedural states: Secondary | ICD-10-CM | POA: Diagnosis not present

## 2018-07-10 DIAGNOSIS — E1140 Type 2 diabetes mellitus with diabetic neuropathy, unspecified: Secondary | ICD-10-CM | POA: Diagnosis not present

## 2018-07-10 DIAGNOSIS — E1165 Type 2 diabetes mellitus with hyperglycemia: Secondary | ICD-10-CM

## 2018-07-10 DIAGNOSIS — IMO0002 Reserved for concepts with insufficient information to code with codable children: Secondary | ICD-10-CM

## 2018-07-10 NOTE — Progress Notes (Signed)
Subjective: Shannon Baxter is a 57 y.o. female patient returns to office today for follow up evaluation after having left hallux medial permanent nail avulsion performed on 05-02-18. Patient is still using Corticosporin solution and reports that there is no pain to this area.  Patient has been soaking using epsom salt daily.  Patient reports that there is a little soreness in her left arch but otherwise denies any other pedal complaints at this time.  Patient denies fever/chills/ new nausea/vomitting/any other related constitutional symptoms at this time.  Patient Active Problem List   Diagnosis Date Noted  . Chronic systolic congestive heart failure (HCC) 12/06/2016  . Morbid obesity (HCC) 12/06/2016  . Presence of automatic implantable cardioverter-defibrillator 10/23/2016  . Dilated cardiomyopathy (HCC) 09/03/2016  . Status post aortic valve replacement 03/29/2016  . Paroxysmal atrial fibrillation (HCC) 03/06/2016  . Rheumatic aortic valve insufficiency 03/05/2016  . Acute on chronic systolic heart failure (HCC) 02/21/2016  . Hypothyroidism 01/23/2016  . Aortic valve stenosis, rheumatic 11/22/2015  . Type 2 diabetes mellitus with diabetic peripheral angiopathy without gangrene, without long-term current use of insulin (HCC) 11/22/2015  . Dyspnea on exertion 10/26/2015  . Atherosclerosis of native arteries of extremities with intermittent claudication, bilateral legs (HCC) 08/04/2015  . Carotid atherosclerosis 07/26/2015  . Coronary artery disease involving native coronary artery of native heart without angina pectoris 07/26/2015  . Dyslipidemia 07/26/2015  . Essential hypertension 07/26/2015  . Intermittent claudication (HCC) 07/26/2015  . Peripheral vascular disease (HCC) 07/26/2015  . History of tobacco abuse 07/26/2015    Current Outpatient Medications on File Prior to Visit  Medication Sig Dispense Refill  . acetaminophen (TYLENOL) 500 MG tablet Take 1,500 mg by mouth 2 (two) times  daily as needed for pain.    Marland Kitchen ALPRAZolam (XANAX) 0.5 MG tablet 1 tablet nightly as needed for sleep  0  . atorvastatin (LIPITOR) 80 MG tablet Take 80 mg by mouth daily.    . beclomethasone (QVAR) 80 MCG/ACT inhaler Inhale into the lungs 2 (two) times daily.    . cephALEXin (KEFLEX) 500 MG capsule Take 1 capsule (500 mg total) by mouth 3 (three) times daily. 21 capsule 0  . clopidogrel (PLAVIX) 75 MG tablet Take 75 mg by mouth daily.  6  . Cyanocobalamin (B-12) 500 MCG TABS Take 3 tablets by mouth daily.    Marland Kitchen diltiazem (CARDIZEM CD) 240 MG 24 hr capsule Take 240 mg by mouth daily.    Marland Kitchen ENTRESTO 97-103 MG Take 1 tablet by mouth 2 (two) times daily.  11  . EPIPEN 2-PAK 0.3 MG/0.3ML SOAJ injection See admin instructions.  0  . ezetimibe (ZETIA) 10 MG tablet Take 10 mg by mouth daily.    . fluticasone (FLONASE) 50 MCG/ACT nasal spray Place 2 sprays into both nostrils daily.     . furosemide (LASIX) 20 MG tablet Alternate 2 tablets daily with 1 tablet daily 180 tablet 3  . gabapentin (NEURONTIN) 300 MG capsule 1 (ONE) CAPSULE BY MOUTH THREE TIMES DAILY  5  . glimepiride (AMARYL) 4 MG tablet Take 4 mg by mouth daily.    Marland Kitchen ipratropium-albuterol (DUONEB) 0.5-2.5 (3) MG/3ML SOLN     . loratadine (CLARITIN) 10 MG tablet Take 10 mg by mouth daily.    . metFORMIN (GLUCOPHAGE) 500 MG tablet Take by mouth 2 (two) times daily with a meal.    . mometasone (ASMANEX) 220 MCG/INH inhaler Inhale 2 puffs into the lungs daily.    . montelukast (SINGULAIR) 10 MG tablet Take  10 mg by mouth daily.    . Multiple Vitamins-Minerals (CENTRUM SILVER ULTRA WOMENS) TABS Take 1 tablet by mouth daily.    Marland Kitchen neomycin-polymyxin-hydrocortisone (CORTISPORIN) OTIC solution Apply twice daily to left 1st toenail 10 mL 0  . nitroGLYCERIN (NITROSTAT) 0.4 MG SL tablet Place 0.4 mg under the tongue every 5 (five) minutes as needed for chest pain.    Marland Kitchen omeprazole (PRILOSEC) 20 MG capsule     . Red Yeast Rice 600 MG TABS Take 2 tablets by  mouth daily.    Marland Kitchen warfarin (COUMADIN) 1 MG tablet Take 1 mg by mouth as directed.    . warfarin (COUMADIN) 5 MG tablet Take 5 mg by mouth as directed.    . carvedilol (COREG) 25 MG tablet Take 1 tablet (25 mg total) by mouth 2 (two) times daily. 60 tablet 6  . hydrALAZINE (APRESOLINE) 10 MG tablet Take 10 mg by mouth 3 (three) times daily.     No current facility-administered medications on file prior to visit.     Allergies  Allergen Reactions  . Iodinated Diagnostic Agents Hives and Rash  . Ioxaglate Hives    Objective:  General: Well developed, nourished, in no acute distress, alert and oriented x3   Dermatology: Skin is warm, dry and supple bilateral.  Left hallux medial nail bed appears to be clean, dry, with mild fibrogranular tissue and surrounding eschar/scab. (-) Erythema. (-) Edema. (-) serosanguous drainage present.  All other nails are thickened and elongated.  There are no other lesions or other signs of infection  present.  Neurovascular status: Intact.  No acute changes from prior.  No lower extremity swelling; No pain with calf compression bilateral.  Musculoskeletal: Decreased tenderness to palpation of the left hallux medial nail fold.  Subjective arch pain occasionally when doing a lot of standing and walking on the left.  Muscular strength within normal limits bilateral.   Assesement and Plan: Problem List Items Addressed This Visit    None    Visit Diagnoses    S/P nail surgery    -  Primary   Type 2 diabetes, uncontrolled, with neuropathy (HCC)          -Examined patient  -Cleansed left hallux medial nail fold and gently scrubbed with peroxide and q-tip/curetted away eschar at site and applied antibiotic cream covered with bandaid.  -Discussed plan of care and advised patient if continues to be a problem will order ABIs however at this time appears to be prematurely healed-Patient may continue to soak with Epson salt as needed and advised patient to finish  off using her Corticosporin solution -Advised patient to work on gentle stretching to prevent recurrence of plantar fasciitis -Mechanically debrided at no charge nails x10 using a sterile nail nipper without incident -Patient is to return weeks for follow-up nail check. [name], DPM

## 2018-07-17 DIAGNOSIS — E119 Type 2 diabetes mellitus without complications: Secondary | ICD-10-CM | POA: Diagnosis not present

## 2018-07-17 DIAGNOSIS — H524 Presbyopia: Secondary | ICD-10-CM | POA: Diagnosis not present

## 2018-07-23 DIAGNOSIS — H5213 Myopia, bilateral: Secondary | ICD-10-CM | POA: Diagnosis not present

## 2018-07-24 DIAGNOSIS — G4733 Obstructive sleep apnea (adult) (pediatric): Secondary | ICD-10-CM | POA: Diagnosis not present

## 2018-07-25 ENCOUNTER — Ambulatory Visit: Payer: Medicare Other | Admitting: Sports Medicine

## 2018-07-31 DIAGNOSIS — R791 Abnormal coagulation profile: Secondary | ICD-10-CM | POA: Diagnosis not present

## 2018-08-01 DIAGNOSIS — G4733 Obstructive sleep apnea (adult) (pediatric): Secondary | ICD-10-CM | POA: Diagnosis not present

## 2018-08-06 ENCOUNTER — Other Ambulatory Visit: Payer: Self-pay

## 2018-08-06 NOTE — Patient Outreach (Signed)
[facility]) Care Management  08/06/2018  Shannon Baxter [birth year] [id number]   Medication Adherence call to Shannon Baxter spoke with patient she is no longer taking Atorvastatin 80 mg doctor took her off this medication  Shannon Baxter is showing past due under [facility].   Lillia Abed CPhT Pharmacy Technician [facility] Care Management Direct Dial [phone]  Fax [phone] [email or web address]

## 2018-08-07 DIAGNOSIS — R791 Abnormal coagulation profile: Secondary | ICD-10-CM | POA: Diagnosis not present

## 2018-08-14 DIAGNOSIS — R791 Abnormal coagulation profile: Secondary | ICD-10-CM | POA: Diagnosis not present

## 2018-08-14 DIAGNOSIS — I5020 Unspecified systolic (congestive) heart failure: Secondary | ICD-10-CM | POA: Diagnosis not present

## 2018-08-17 DIAGNOSIS — K219 Gastro-esophageal reflux disease without esophagitis: Secondary | ICD-10-CM

## 2018-08-17 DIAGNOSIS — Z952 Presence of prosthetic heart valve: Secondary | ICD-10-CM | POA: Diagnosis not present

## 2018-08-17 DIAGNOSIS — E1169 Type 2 diabetes mellitus with other specified complication: Secondary | ICD-10-CM | POA: Diagnosis not present

## 2018-08-17 DIAGNOSIS — I5022 Chronic systolic (congestive) heart failure: Secondary | ICD-10-CM | POA: Diagnosis not present

## 2018-08-17 DIAGNOSIS — D649 Anemia, unspecified: Secondary | ICD-10-CM

## 2018-08-17 DIAGNOSIS — I4891 Unspecified atrial fibrillation: Secondary | ICD-10-CM

## 2018-08-17 DIAGNOSIS — R072 Precordial pain: Secondary | ICD-10-CM | POA: Diagnosis not present

## 2018-08-17 DIAGNOSIS — R0602 Shortness of breath: Secondary | ICD-10-CM | POA: Diagnosis not present

## 2018-08-17 DIAGNOSIS — R079 Chest pain, unspecified: Secondary | ICD-10-CM | POA: Diagnosis not present

## 2018-08-17 DIAGNOSIS — F419 Anxiety disorder, unspecified: Secondary | ICD-10-CM

## 2018-08-17 DIAGNOSIS — R05 Cough: Secondary | ICD-10-CM | POA: Diagnosis not present

## 2018-08-17 DIAGNOSIS — E1159 Type 2 diabetes mellitus with other circulatory complications: Secondary | ICD-10-CM | POA: Diagnosis not present

## 2018-08-17 DIAGNOSIS — I739 Peripheral vascular disease, unspecified: Secondary | ICD-10-CM | POA: Diagnosis not present

## 2018-08-18 DIAGNOSIS — E785 Hyperlipidemia, unspecified: Secondary | ICD-10-CM | POA: Diagnosis not present

## 2018-08-18 DIAGNOSIS — I110 Hypertensive heart disease with heart failure: Secondary | ICD-10-CM | POA: Diagnosis not present

## 2018-08-18 DIAGNOSIS — E1159 Type 2 diabetes mellitus with other circulatory complications: Secondary | ICD-10-CM

## 2018-08-18 DIAGNOSIS — Z79899 Other long term (current) drug therapy: Secondary | ICD-10-CM | POA: Diagnosis not present

## 2018-08-18 DIAGNOSIS — E1169 Type 2 diabetes mellitus with other specified complication: Secondary | ICD-10-CM

## 2018-08-18 DIAGNOSIS — I739 Peripheral vascular disease, unspecified: Secondary | ICD-10-CM

## 2018-08-18 DIAGNOSIS — M1990 Unspecified osteoarthritis, unspecified site: Secondary | ICD-10-CM | POA: Diagnosis not present

## 2018-08-18 DIAGNOSIS — Z95810 Presence of automatic (implantable) cardiac defibrillator: Secondary | ICD-10-CM | POA: Diagnosis not present

## 2018-08-18 DIAGNOSIS — I5022 Chronic systolic (congestive) heart failure: Secondary | ICD-10-CM | POA: Diagnosis not present

## 2018-08-18 DIAGNOSIS — I480 Paroxysmal atrial fibrillation: Secondary | ICD-10-CM | POA: Diagnosis not present

## 2018-08-18 DIAGNOSIS — E1149 Type 2 diabetes mellitus with other diabetic neurological complication: Secondary | ICD-10-CM | POA: Diagnosis not present

## 2018-08-18 DIAGNOSIS — I4891 Unspecified atrial fibrillation: Secondary | ICD-10-CM | POA: Diagnosis not present

## 2018-08-18 DIAGNOSIS — D649 Anemia, unspecified: Secondary | ICD-10-CM | POA: Diagnosis not present

## 2018-08-18 DIAGNOSIS — I4892 Unspecified atrial flutter: Secondary | ICD-10-CM | POA: Diagnosis not present

## 2018-08-18 DIAGNOSIS — Z91041 Radiographic dye allergy status: Secondary | ICD-10-CM | POA: Diagnosis not present

## 2018-08-18 DIAGNOSIS — R0602 Shortness of breath: Secondary | ICD-10-CM | POA: Diagnosis not present

## 2018-08-18 DIAGNOSIS — R05 Cough: Secondary | ICD-10-CM | POA: Diagnosis not present

## 2018-08-18 DIAGNOSIS — R079 Chest pain, unspecified: Secondary | ICD-10-CM | POA: Diagnosis not present

## 2018-08-18 DIAGNOSIS — Z8673 Personal history of transient ischemic attack (TIA), and cerebral infarction without residual deficits: Secondary | ICD-10-CM | POA: Diagnosis not present

## 2018-08-18 DIAGNOSIS — Z952 Presence of prosthetic heart valve: Secondary | ICD-10-CM | POA: Diagnosis not present

## 2018-08-18 DIAGNOSIS — K219 Gastro-esophageal reflux disease without esophagitis: Secondary | ICD-10-CM

## 2018-08-18 DIAGNOSIS — E1151 Type 2 diabetes mellitus with diabetic peripheral angiopathy without gangrene: Secondary | ICD-10-CM | POA: Diagnosis not present

## 2018-08-18 DIAGNOSIS — Z7901 Long term (current) use of anticoagulants: Secondary | ICD-10-CM | POA: Diagnosis not present

## 2018-08-18 DIAGNOSIS — F419 Anxiety disorder, unspecified: Secondary | ICD-10-CM

## 2018-08-18 DIAGNOSIS — I471 Supraventricular tachycardia: Secondary | ICD-10-CM | POA: Diagnosis not present

## 2018-08-18 DIAGNOSIS — R072 Precordial pain: Secondary | ICD-10-CM | POA: Diagnosis not present

## 2018-08-19 DIAGNOSIS — I480 Paroxysmal atrial fibrillation: Secondary | ICD-10-CM

## 2018-08-19 DIAGNOSIS — I4891 Unspecified atrial fibrillation: Secondary | ICD-10-CM

## 2018-08-19 DIAGNOSIS — R0602 Shortness of breath: Secondary | ICD-10-CM

## 2018-08-22 ENCOUNTER — Ambulatory Visit: Payer: Medicare Other | Admitting: Cardiology

## 2018-08-22 DIAGNOSIS — I4892 Unspecified atrial flutter: Secondary | ICD-10-CM

## 2018-08-23 DIAGNOSIS — Z95810 Presence of automatic (implantable) cardiac defibrillator: Secondary | ICD-10-CM

## 2018-08-23 DIAGNOSIS — Z952 Presence of prosthetic heart valve: Secondary | ICD-10-CM

## 2018-08-23 DIAGNOSIS — I5022 Chronic systolic (congestive) heart failure: Secondary | ICD-10-CM

## 2018-08-28 DIAGNOSIS — I4891 Unspecified atrial fibrillation: Secondary | ICD-10-CM | POA: Diagnosis not present

## 2018-08-28 DIAGNOSIS — Z79899 Other long term (current) drug therapy: Secondary | ICD-10-CM | POA: Diagnosis not present

## 2018-08-28 DIAGNOSIS — R791 Abnormal coagulation profile: Secondary | ICD-10-CM | POA: Diagnosis not present

## 2018-09-01 ENCOUNTER — Ambulatory Visit (INDEPENDENT_AMBULATORY_CARE_PROVIDER_SITE_OTHER): Payer: Medicare Other | Admitting: Cardiology

## 2018-09-01 ENCOUNTER — Encounter: Payer: Self-pay | Admitting: Cardiology

## 2018-09-01 VITALS — BP 114/60 | HR 81 | Ht 70.0 in | Wt 328.6 lb

## 2018-09-01 DIAGNOSIS — I10 Essential (primary) hypertension: Secondary | ICD-10-CM

## 2018-09-01 DIAGNOSIS — Z952 Presence of prosthetic heart valve: Secondary | ICD-10-CM | POA: Diagnosis not present

## 2018-09-01 DIAGNOSIS — Z95810 Presence of automatic (implantable) cardiac defibrillator: Secondary | ICD-10-CM | POA: Diagnosis not present

## 2018-09-01 DIAGNOSIS — I480 Paroxysmal atrial fibrillation: Secondary | ICD-10-CM | POA: Diagnosis not present

## 2018-09-01 DIAGNOSIS — G4733 Obstructive sleep apnea (adult) (pediatric): Secondary | ICD-10-CM | POA: Diagnosis not present

## 2018-09-01 DIAGNOSIS — I4892 Unspecified atrial flutter: Secondary | ICD-10-CM

## 2018-09-01 HISTORY — DX: Unspecified atrial flutter: I48.92

## 2018-09-01 NOTE — Patient Instructions (Signed)
Medication Instructions:  Your physician has recommended you make the following change in your medication:  DECREASE: Amiodarone to 200 mg daily on 09/08/18  If you need a refill on your cardiac medications before your next appointment, please call your pharmacy.   Lab work: None.  If you have labs (blood work) drawn today and your tests are completely normal, you will receive your results only by: Marland Kitchen MyChart Message (if you have MyChart) OR . A paper copy in the mail If you have any lab test that is abnormal or we need to change your treatment, we will call you to review the results.  Testing/Procedures: None.   Follow-Up: At [facility], you and your health needs are our priority.  As part of our continuing mission to provide you with exceptional heart care, we have created designated Provider Care Teams.  These Care Teams include your primary Cardiologist (physician) and Advanced Practice Providers (APPs -  Physician Assistants and Nurse Practitioners) who all work together to provide you with the care you need, when you need it. You will need a follow up appointment in 1 months.  Please call our office 2 months in advance to schedule this appointment.  You may see No primary care provider on file. or another member of our [employer] Provider Team in [place]: Norman Herrlich, MD . Belva Crome, MD  Any Other Special Instructions Will Be Listed Below (If Applicable).

## 2018-09-01 NOTE — Progress Notes (Signed)
Cardiology Office Note:    Date:  09/01/2018   ID:  Shannon Baxter, DOB 1961-09-09, MRN [id number]  PCP:  Paulina Fusi, MD  Cardiologist:  Gypsy Balsam, MD    Referring MD: Paulina Fusi, MD   Chief Complaint  Patient presents with  . Hospitalization Follow-up  . Follow-up  I am doing better  History of Present Illness:    Shannon Baxter is a 57 y.o. female with a very complex past medical history.  That include history of cardiomyopathy with ejection fraction 25 to 30%, status post aortic valve replacement with mechanical valve, paroxysmal atrial flutter, type 2 diabetes, morbid obesity, hypertension, dyslipidemia.  Recently she was admitted to the hospital she was find to be in atrial flutter and it was very difficult to control her ventricular rate she required multiple AV blocking agents until eventually she was put on amiodarone and converted to sinus rhythm.  I have pacemaker representative coming interrogating her device look like predominant rhythm with tachycardia was atrial flutter therefore she is here today in our office to talk about potentially doing atrial flutter ablation.  She also was decompensated and from congestive heart failure point review when she came to around the hospital.  Therefore she was aggressive diuresis and doing much better right now doing well denies having any palpitations no chest pain tightness squeezing pressure burning chest.  No discharges from the defibrillator.  Past Medical History:  Diagnosis Date  . Allergy   . Aortic insufficiency   . Aortic stenosis   . Arthritis   . Asthma   . CHF (congestive heart failure) (HCC)   . Claudication (HCC)   . Coronary artery disease   . Diabetes mellitus without complication (HCC)   . GERD (gastroesophageal reflux disease)   . Heart murmur   . Hyperlipidemia   . Hypertension   . Nutritional and metabolic cardiomyopathy (HCC)   . PVD (peripheral vascular disease) (HCC)   . Sleep apnea   .  Stroke [facility])     Past Surgical History:  Procedure Laterality Date  . CARDIAC CATHETERIZATION    . INSERT / REPLACE / REMOVE PACEMAKER     Medtronic ICD  . MECHANICAL AORTIC VALVE REPLACEMENT    . PERIPHERAL ARTERIAL STENT GRAFT    . TUBAL LIGATION      Current Medications: Current Meds  Medication Sig  . acetaminophen (TYLENOL) 500 MG tablet Take 1,500 mg by mouth 2 (two) times daily as needed for pain.  Marland Kitchen ALPRAZolam (XANAX) 0.5 MG tablet 1 tablet nightly as needed for sleep  . amiodarone (PACERONE) 200 MG tablet Take 1 tablet by mouth daily.  Marland Kitchen atorvastatin (LIPITOR) 80 MG tablet Take 80 mg by mouth daily.  . beclomethasone (QVAR) 80 MCG/ACT inhaler Inhale into the lungs 2 (two) times daily.  . carvedilol (COREG) 25 MG tablet Take 1 tablet (25 mg total) by mouth 2 (two) times daily.  . cephALEXin (KEFLEX) 500 MG capsule Take 1 capsule (500 mg total) by mouth 3 (three) times daily.  . clopidogrel (PLAVIX) 75 MG tablet Take 75 mg by mouth daily.  . Cyanocobalamin (B-12) 500 MCG TABS Take 3 tablets by mouth daily.  . digoxin (LANOXIN) 0.125 MG tablet Take 0.0625 mg by mouth daily.  Marland Kitchen ENTRESTO 97-103 MG Take 1 tablet by mouth 2 (two) times daily.  Marland Kitchen EPIPEN 2-PAK 0.3 MG/0.3ML SOAJ injection See admin instructions.  Marland Kitchen ezetimibe (ZETIA) 10 MG tablet Take 10 mg by mouth daily.  . fluticasone (  FLONASE) 50 MCG/ACT nasal spray Place 2 sprays into both nostrils daily.   . furosemide (LASIX) 20 MG tablet Alternate 2 tablets daily with 1 tablet daily  . gabapentin (NEURONTIN) 300 MG capsule 1 (ONE) CAPSULE BY MOUTH THREE TIMES DAILY  . glimepiride (AMARYL) 4 MG tablet Take 4 mg by mouth daily.  Marland Kitchen ipratropium-albuterol (DUONEB) 0.5-2.5 (3) MG/3ML SOLN   . loratadine (CLARITIN) 10 MG tablet Take 10 mg by mouth daily.  . metFORMIN (GLUCOPHAGE) 500 MG tablet Take by mouth 2 (two) times daily with a meal.  . metoprolol (TOPROL-XL) 200 MG 24 hr tablet Take 1 tablet by mouth daily.  . mometasone  (ASMANEX) 220 MCG/INH inhaler Inhale 2 puffs into the lungs daily.  . montelukast (SINGULAIR) 10 MG tablet Take 10 mg by mouth daily.  . Multiple Vitamins-Minerals (CENTRUM SILVER ULTRA WOMENS) TABS Take 1 tablet by mouth daily.  Marland Kitchen neomycin-polymyxin-hydrocortisone (CORTISPORIN) OTIC solution Apply twice daily to left 1st toenail  . nitroGLYCERIN (NITROSTAT) 0.4 MG SL tablet Place 0.4 mg under the tongue every 5 (five) minutes as needed for chest pain.  Marland Kitchen omeprazole (PRILOSEC) 20 MG capsule   . Red Yeast Rice 600 MG TABS Take 2 tablets by mouth daily.  Marland Kitchen warfarin (COUMADIN) 1 MG tablet Take 1 mg by mouth as directed.  . warfarin (COUMADIN) 5 MG tablet Take 5 mg by mouth as directed.     Allergies:   Iodinated diagnostic agents and Ioxaglate   Social History   Socioeconomic History  . Marital status: Single    Spouse name: Not on file  . Number of children: Not on file  . Years of education: Not on file  . Highest education level: Not on file  Occupational History  . Not on file  Social Needs  . Financial resource strain: Not on file  . Food insecurity:    Worry: Not on file    Inability: Not on file  . Transportation needs:    Medical: Not on file    Non-medical: Not on file  Tobacco Use  . Smoking status: Former Games developer  . Smokeless tobacco: Never Used  Substance and Sexual Activity  . Alcohol use: No  . Drug use: No  . Sexual activity: Not on file  Lifestyle  . Physical activity:    Days per week: Not on file    Minutes per session: Not on file  . Stress: Not on file  Relationships  . Social connections:    Talks on phone: Not on file    Gets together: Not on file    Attends religious service: Not on file    Active member of club or organization: Not on file    Attends meetings of clubs or organizations: Not on file    Relationship status: Not on file  Other Topics Concern  . Not on file  Social History Narrative  . Not on file     Family History: The  patient's family history includes Heart disease in her brother. ROS:   Please see the history of present illness.    All 14 point review of systems negative except as described per history of present illness  EKGs/Labs/Other Studies Reviewed:      Recent Labs: 12/31/2017: BUN 14; Creatinine, Ser 0.82; NT-Pro BNP 212; Potassium 4.0; Sodium 142  Recent Lipid Panel    Component Value Date/Time   CHOL  07/04/2007 0815    200        ATP III CLASSIFICATION:  <  200     mg/dL   Desirable  161-096  mg/dL   Borderline High  >=045    mg/dL   High   TRIG 409 81/19/1478 0815   HDL 47 07/04/2007 0815   CHOLHDL 4.3 07/04/2007 0815   VLDL 28 07/04/2007 0815   LDLCALC (H) 07/04/2007 0815    125        Total Cholesterol/HDL:CHD Risk Coronary Heart Disease Risk Table                     Men   Women  1/2 Average Risk   3.4   3.3    Physical Exam:    VS:  BP 114/60   Pulse 81   Ht  (1.778 m)   Wt (!) 328 lb 9.6 oz (149.1 kg)   SpO2 95%   BMI 47.15 kg/m     Wt Readings from Last 3 Encounters:  09/01/18 (!) 328 lb 9.6 oz (149.1 kg)  04/25/18 (!) 351 lb 9.6 oz (159.5 kg)  01/31/18 (!) 322 lb 1.9 oz (146.1 kg)     GEN:  Well nourished, well developed in no acute distress HEENT: Normal NECK: No JVD; No carotid bruits LYMPHATICS: No lymphadenopathy CARDIAC: RRR, no murmurs, no rubs, no gallops RESPIRATORY:  Clear to auscultation without rales, wheezing or rhonchi  ABDOMEN: Soft, non-tender, non-distended MUSCULOSKELETAL:  No edema; No deformity  SKIN: Warm and dry LOWER EXTREMITIES: no swelling NEUROLOGIC:  Alert and oriented x 3 PSYCHIATRIC:  Normal affect   ASSESSMENT:    1. Paroxysmal atrial fibrillation (HCC)   2. Status post aortic valve replacement   3. Presence of automatic implantable cardioverter-defibrillator   4. Paroxysmal atrial flutter (HCC)   5. Essential hypertension    PLAN:    In order of problems listed above:  1. Paroxysmal atrial flutter.  She  will be referred to EP team for consideration of ablation.  I also asked her to continue amiodarone 200 mg twice daily for another week and then lowered this to only 200 mg daily I think a possibility of ablation of the atrial flutter is quite tempting since she is relatively young can continuation of amiodarone for long period of time will be detrimental to her. 2. Status post aortic valve replacement function properly. 3. ICD present interrogated in the hospital functioning normally. 4. Essential hypertension blood pressure well controlled. 5. Systolic congestive heart failure with ejection fraction 25 to 30%.  On appropriate medication that she can tolerate.    Medication Adjustments/Labs and Tests Ordered: Current medicines are reviewed at length with the patient today.  Concerns regarding medicines are outlined above.  Orders Placed This Encounter  Procedures  . Ambulatory referral to Cardiac Electrophysiology   Medication changes: No orders of the defined types were placed in this encounter.   Signed, Georgeanna Lea, MD, [facility] 09/01/2018 4:36 PM    [facility]

## 2018-09-02 DIAGNOSIS — Z950 Presence of cardiac pacemaker: Secondary | ICD-10-CM | POA: Diagnosis not present

## 2018-09-04 DIAGNOSIS — R791 Abnormal coagulation profile: Secondary | ICD-10-CM | POA: Diagnosis not present

## 2018-09-09 DIAGNOSIS — R791 Abnormal coagulation profile: Secondary | ICD-10-CM | POA: Diagnosis not present

## 2018-09-16 DIAGNOSIS — R791 Abnormal coagulation profile: Secondary | ICD-10-CM | POA: Diagnosis not present

## 2018-09-16 LAB — PROTIME-INR

## 2018-09-19 DIAGNOSIS — R791 Abnormal coagulation profile: Secondary | ICD-10-CM | POA: Diagnosis not present

## 2018-09-19 LAB — PROTIME-INR

## 2018-09-23 ENCOUNTER — Telehealth: Payer: Self-pay | Admitting: Cardiology

## 2018-09-23 MED ORDER — CARVEDILOL 25 MG PO TABS: 25.0000 mg | ORAL_TABLET | Freq: Two times a day (BID) | ORAL | 1 refills | Status: DC

## 2018-09-23 NOTE — Telephone Encounter (Signed)
Carvedilol 25 mg twice daily refilled.  

## 2018-09-23 NOTE — Telephone Encounter (Signed)
Please all Carvedilol to the [facility]

## 2018-09-26 DIAGNOSIS — R791 Abnormal coagulation profile: Secondary | ICD-10-CM | POA: Diagnosis not present

## 2018-09-26 DIAGNOSIS — G4733 Obstructive sleep apnea (adult) (pediatric): Secondary | ICD-10-CM | POA: Diagnosis not present

## 2018-09-26 LAB — PROTIME-INR

## 2018-09-29 ENCOUNTER — Encounter: Payer: Self-pay | Admitting: Cardiology

## 2018-09-29 ENCOUNTER — Ambulatory Visit (INDEPENDENT_AMBULATORY_CARE_PROVIDER_SITE_OTHER): Payer: Medicare Other | Admitting: Cardiology

## 2018-09-29 VITALS — BP 126/76 | HR 115 | Ht 70.0 in | Wt 331.0 lb

## 2018-09-29 DIAGNOSIS — I5022 Chronic systolic (congestive) heart failure: Secondary | ICD-10-CM

## 2018-09-29 DIAGNOSIS — Z952 Presence of prosthetic heart valve: Secondary | ICD-10-CM | POA: Diagnosis not present

## 2018-09-29 DIAGNOSIS — I10 Essential (primary) hypertension: Secondary | ICD-10-CM | POA: Diagnosis not present

## 2018-09-29 DIAGNOSIS — J4530 Mild persistent asthma, uncomplicated: Secondary | ICD-10-CM | POA: Diagnosis not present

## 2018-09-29 DIAGNOSIS — R791 Abnormal coagulation profile: Secondary | ICD-10-CM | POA: Diagnosis not present

## 2018-09-29 DIAGNOSIS — I483 Typical atrial flutter: Secondary | ICD-10-CM

## 2018-09-29 DIAGNOSIS — J301 Allergic rhinitis due to pollen: Secondary | ICD-10-CM | POA: Diagnosis not present

## 2018-09-29 DIAGNOSIS — G4733 Obstructive sleep apnea (adult) (pediatric): Secondary | ICD-10-CM

## 2018-09-29 LAB — PROTIME-INR

## 2018-09-29 NOTE — Patient Instructions (Addendum)
Medication Instructions:  Your physician recommends that you continue on your current medications as directed. Please refer to the Current Medication list given to you today.  *If you need a refill on your cardiac medications before your next appointment, please call your pharmacy*  Labwork: None ordered  Testing/Procedures: Your physician has recommended that you have an ablation. Catheter ablation is a medical procedure used to treat some cardiac arrhythmias (irregular heartbeats). During catheter ablation, a long, thin, flexible tube is put into a blood vessel in your groin (upper thigh), or neck. This tube is called an ablation catheter. It is then guided to your heart through the blood vessel. Radio frequency waves destroy small areas of heart tissue where abnormal heartbeats may cause an arrhythmia to start. Please see the instruction sheet given to you today.  The nurse will call you to schedule this procedure.  Instructions for your ablation: 1. Please arrive at the [facility] Main Entrance "A[facility] of [facility] at _____ on _________. 2. Do not eat or drink after midnight the night prior to the procedure. 3. Do not miss any doses of COUMADIN prior to the morning of the procedure.  4. Do not take any medications the morning of the procedure. 5. Plan for an overnight stay in the hospital. 6. You will need someone to drive you home at discharge.   Follow-Up: Remote monitoring is used to monitor your Pacemaker or ICD from home. This monitoring reduces the number of office visits required to check your device to one time per year. It allows [place] to keep an eye on the functioning of your device to ensure it is working properly. You are scheduled for a device check from home on 12/29/2018. You may send your transmission at any time that day. If you have a wireless device, the transmission will be sent automatically. After your physician reviews your transmission, you will receive a  postcard with your next transmission date.  To be determined once procedure is scheduled.  Thank you for choosing [facility]!!   Dory Horn, RN [phone]  Any Other Special Instructions Will Be Listed Below (If Applicable).   Cardiac Ablation Cardiac ablation is a procedure to stop some heart tissue from causing problems. The heart has many electrical connections. Sometimes these connections make the heart beat very fast or irregularly. Removing some problem areas can improve the heart rhythm or make it normal. What happens before the procedure?  Follow instructions from your doctor about what you cannot eat or drink.  Ask your doctor about: ? Changing or stopping your normal medicines. This is important if you take diabetes medicines or blood thinners. ? Taking medicines such as aspirin and ibuprofen. These medicines can thin your blood. Do not take these medicines before your procedure if your doctor tells you not to.  Plan to have someone take you home.  If you will be going home right after the procedure, plan to have someone with you for 24 hours. What happens during the procedure?  To lower your risk of infection: ? Your health care team will wash or sanitize their hands. ? Your skin will be washed with soap. ? Hair may be removed from your neck or groin.  An IV tube will be put into one of your veins.  You will be given a medicine to help you relax (sedative).  Skin on your neck or groin will be numbed.  A cut (incision) will be made in your neck or groin.  A needle will be put through your cut and into a vein in your neck or groin.  A tube (catheter) will be put into the needle. The tube will be moved to your heart. X-rays (fluoroscopy) will be used to help guide the tube.  Small devices (electrodes) on the tip of the tube will send out electrical currents.  Dye may be put through the tube. This helps your surgeon see your heart.  Electrical energy  will be used to scar (ablate) some heart tissue. Your surgeon may use: ? Heat (radiofrequency energy). ? Laser energy. ? Extreme cold (cryoablation).  The tube will be taken out.  Pressure will be held on your cut. This helps stop bleeding.  A bandage (dressing) will be put on your cut. The procedure may vary. What happens after the procedure?  You will be monitored until your medicines have worn off.  Your cut will be watched for bleeding. You will need to lie still for a few hours.  Do not drive for 24 hours or as long as your doctor tells you. Summary  Cardiac ablation is a procedure to stop some heart tissue from causing problems.  Electrical energy will be used to scar (ablate) some heart tissue. This information is not intended to replace advice given to you by your health care provider. Make sure you discuss any questions you have with your health care provider. Document Released: 06/10/2013 Document Revised: 08/27/2016 Document Reviewed: 08/27/2016 Elsevier Interactive Patient Education  2017 [employer].

## 2018-09-29 NOTE — Progress Notes (Signed)
Electrophysiology Office Note   Date:  09/29/2018   ID:  Shannon Baxter, DOB [birth year], MRN [id number]  PCP:  Paulina Fusi, MD  Cardiologist:  Bing Matter Primary Electrophysiologist:  Will Jorja Loa, MD    No chief complaint on file.    History of Present Illness: Shannon Baxter is a 57 y.o. female who is being seen today for the evaluation of atrial fibrillation, CHF at the request of Gypsy Balsam. Presenting today for electrophysiology evaluation.  Has a history of cardiomyopathy with an ejection fraction of 25 to 30% status post aortic valve replacement with mechanical valve, paroxysmal atrial flutter, type 2 diabetes, morbid obesity, hypertension, hyperlipidemia.  She was put on amiodarone previously and converted to sinus rhythm.  At that time, she is continued to have weakness, fatigue, and shortness of breath.  Today, she denies symptoms of palpitations, chest pain, shortness of breath, orthopnea, PND, lower extremity edema, claudication, dizziness, presyncope, syncope, bleeding, or neurologic sequela. The patient is tolerating medications without difficulties.    Past Medical History:  Diagnosis Date  . Allergy   . Aortic insufficiency   . Aortic stenosis   . Arthritis   . Asthma   . CHF (congestive heart failure) (HCC)   . Claudication (HCC)   . Coronary artery disease   . Diabetes mellitus without complication (HCC)   . GERD (gastroesophageal reflux disease)   . Heart murmur   . Hyperlipidemia   . Hypertension   . Nutritional and metabolic cardiomyopathy (HCC)   . PVD (peripheral vascular disease) (HCC)   . Sleep apnea   . Stroke [facility])    Past Surgical History:  Procedure Laterality Date  . CARDIAC CATHETERIZATION    . INSERT / REPLACE / REMOVE PACEMAKER     Medtronic ICD  . MECHANICAL AORTIC VALVE REPLACEMENT    . PERIPHERAL ARTERIAL STENT GRAFT    . TUBAL LIGATION       Current Outpatient Medications  Medication Sig Dispense Refill  .  acetaminophen (TYLENOL) 500 MG tablet Take 1,500 mg by mouth 2 (two) times daily as needed for pain.    Marland Kitchen ALPRAZolam (XANAX) 0.5 MG tablet 1 tablet nightly as needed for sleep  0  . amiodarone (PACERONE) 200 MG tablet Take 1 tablet by mouth daily.  0  . atorvastatin (LIPITOR) 80 MG tablet Take 80 mg by mouth daily.    . beclomethasone (QVAR) 80 MCG/ACT inhaler Inhale into the lungs 2 (two) times daily.    . carvedilol (COREG) 25 MG tablet Take 1 tablet (25 mg total) by mouth 2 (two) times daily. 180 tablet 1  . cephALEXin (KEFLEX) 500 MG capsule Take 1 capsule (500 mg total) by mouth 3 (three) times daily. 21 capsule 0  . clopidogrel (PLAVIX) 75 MG tablet Take 75 mg by mouth daily.  6  . Cyanocobalamin (B-12) 500 MCG TABS Take 3 tablets by mouth daily.    . digoxin (LANOXIN) 0.125 MG tablet Take 0.0625 mg by mouth daily.    Marland Kitchen ENTRESTO 97-103 MG Take 1 tablet by mouth 2 (two) times daily.  11  . EPIPEN 2-PAK 0.3 MG/0.3ML SOAJ injection See admin instructions.  0  . ezetimibe (ZETIA) 10 MG tablet Take 10 mg by mouth daily.    . fluticasone (FLONASE) 50 MCG/ACT nasal spray Place 2 sprays into both nostrils daily.     . furosemide (LASIX) 20 MG tablet Alternate 2 tablets daily with 1 tablet daily 180 tablet 3  . gabapentin (NEURONTIN) 300  MG capsule 1 (ONE) CAPSULE BY MOUTH THREE TIMES DAILY  5  . glimepiride (AMARYL) 4 MG tablet Take 4 mg by mouth daily.    Marland Kitchen ipratropium-albuterol (DUONEB) 0.5-2.5 (3) MG/3ML SOLN     . loratadine (CLARITIN) 10 MG tablet Take 10 mg by mouth daily.    . metFORMIN (GLUCOPHAGE) 500 MG tablet Take by mouth 2 (two) times daily with a meal.    . metoprolol (TOPROL-XL) 200 MG 24 hr tablet Take 1 tablet by mouth daily.    . mometasone (ASMANEX) 220 MCG/INH inhaler Inhale 2 puffs into the lungs daily.    . montelukast (SINGULAIR) 10 MG tablet Take 10 mg by mouth daily.    . Multiple Vitamins-Minerals (CENTRUM SILVER ULTRA WOMENS) TABS Take 1 tablet by mouth daily.    Marland Kitchen  neomycin-polymyxin-hydrocortisone (CORTISPORIN) OTIC solution Apply twice daily to left 1st toenail 10 mL 0  . nitroGLYCERIN (NITROSTAT) 0.4 MG SL tablet Place 0.4 mg under the tongue every 5 (five) minutes as needed for chest pain.    Marland Kitchen omeprazole (PRILOSEC) 20 MG capsule     . Red Yeast Rice 600 MG TABS Take 2 tablets by mouth daily.    Marland Kitchen warfarin (COUMADIN) 1 MG tablet Take 1 mg by mouth as directed.    . warfarin (COUMADIN) 5 MG tablet Take 5 mg by mouth as directed.    . hydrALAZINE (APRESOLINE) 10 MG tablet Take 10 mg by mouth 3 (three) times daily.     No current facility-administered medications for this visit.     Allergies:   Iodinated diagnostic agents and Ioxaglate   Social History:  The patient  reports that she has quit smoking. She has never used smokeless tobacco. She reports that she does not drink alcohol or use drugs.   Family History:  The patient's family history includes Heart disease in her brother.    ROS:  Please see the history of present illness.   Otherwise, review of systems is positive for weakness, fatigue, shortness of breath.   All other systems are reviewed and negative.    PHYSICAL EXAM: VS:  BP 126/76   Pulse (!) 115   Ht  (1.778 m)   Wt (!) 331 lb (150.1 kg)   BMI 47.49 kg/m  , BMI Body mass index is 47.49 kg/m. GEN: Well nourished, well developed, in no acute distress  HEENT: normal  Neck: no JVD, carotid bruits, or masses Cardiac: RRR; no murmurs, rubs, or gallops,no edema  Respiratory:  clear to auscultation bilaterally, normal work of breathing GI: soft, nontender, nondistended, + BS MS: no deformity or atrophy  Skin: warm and dry, device pocket is well healed Neuro:  Strength and sensation are intact Psych: euthymic mood, full affect  EKG:  EKG is ordered today. Personal review of the ekg ordered shows atrial flutter, rate 115  Device interrogation is reviewed today in detail.  See PaceArt for details.   Recent  Labs: 12/31/2017: BUN 14; Creatinine, Ser 0.82; NT-Pro BNP 212; Potassium 4.0; Sodium 142    Lipid Panel     Component Value Date/Time   CHOL  07/04/2007 0815    200        ATP III CLASSIFICATION:  <200     mg/dL   Desirable  161-096  mg/dL   Borderline High  >=045    mg/dL   High   TRIG 409 81/19/1478 0815   HDL 47 07/04/2007 0815   CHOLHDL 4.3 07/04/2007 0815  VLDL 28 07/04/2007 0815   LDLCALC (H) 07/04/2007 0815    125        Total Cholesterol/HDL:CHD Risk Coronary Heart Disease Risk Table                     Men   Women  1/2 Average Risk   3.4   3.3     Wt Readings from Last 3 Encounters:  09/29/18 (!) 331 lb (150.1 kg)  09/01/18 (!) 328 lb 9.6 oz (149.1 kg)  04/25/18 (!) 351 lb 9.6 oz (159.5 kg)      Other studies Reviewed: Additional studies/ records that were reviewed today include: TTE 11/21/17  Review of the above records today demonstrates:  Mild concentric LVH, EF 30% Normal left atrial size mild to moderate mitral regurgitation Mild to moderate aortic stenosis    ASSESSMENT AND PLAN:  1.  Typical atrial flutter: Currently on amiodarone and warfarin.  She is back in atrial flutter.  I have told her that we could either ablate her or repeat her cardioversion.  She would prefer to have ablation performed.  Risks and benefits were discussed include bleeding, tamponade, heart block, stroke, among others.  She understands these risks and is agreed to the procedure.  This patients CHA2DS2-VASc Score and unadjusted Ischemic Stroke Rate (% per year) is equal to 2.2 % stroke rate/year from a score of 2  Above score calculated as 1 point each if present [CHF, HTN, DM, Vascular=MI/PAD/Aortic Plaque, Age if 65-74, or Female] Above score calculated as 2 points each if present [Age > 75, or Stroke/TIA/TE]  2.  Nonischemic cardiomyopathy: Status post Medtronic ICD.  Device functioning appropriately.  No changes.  3.  Hypertension: Currently well controlled  4.   Aortic valve replacement: Functioning appropriately on most recent echo.  No changes.  Lan per primary cardiology  5.  Obstructive sleep apnea: Encouraged CPAP compliance  Current medicines are reviewed at length with the patient today.   The patient does not have concerns regarding her medicines.  The following changes were made today:  none  Labs/ tests ordered today include:  Orders Placed This Encounter  Procedures  . EKG 12-Lead   Case discussed with primary cardiology  Disposition:   FU with Will Camnitz 3 months  Signed, Will Jorja Loa, MD  09/29/2018 3:46 PM     [facility] [address] [place] [address] [phone] (office) [phone] (fax)

## 2018-10-01 ENCOUNTER — Encounter: Payer: Self-pay | Admitting: Cardiology

## 2018-10-01 ENCOUNTER — Ambulatory Visit (INDEPENDENT_AMBULATORY_CARE_PROVIDER_SITE_OTHER): Payer: Medicare Other | Admitting: Cardiology

## 2018-10-01 VITALS — BP 134/66 | HR 112 | Ht 70.0 in | Wt 332.2 lb

## 2018-10-01 DIAGNOSIS — I4892 Unspecified atrial flutter: Secondary | ICD-10-CM | POA: Diagnosis not present

## 2018-10-01 DIAGNOSIS — Z952 Presence of prosthetic heart valve: Secondary | ICD-10-CM | POA: Diagnosis not present

## 2018-10-01 DIAGNOSIS — I2510 Atherosclerotic heart disease of native coronary artery without angina pectoris: Secondary | ICD-10-CM

## 2018-10-01 DIAGNOSIS — I5022 Chronic systolic (congestive) heart failure: Secondary | ICD-10-CM | POA: Diagnosis not present

## 2018-10-01 DIAGNOSIS — G4733 Obstructive sleep apnea (adult) (pediatric): Secondary | ICD-10-CM | POA: Diagnosis not present

## 2018-10-01 LAB — BASIC METABOLIC PANEL
BUN/Creatinine Ratio: 12 (ref 9–23)
BUN: 12 mg/dL (ref 6–24)
CHLORIDE: 105 mmol/L (ref 96–106)
CO2: 24 mmol/L (ref 20–29)
CREATININE: 0.99 mg/dL (ref 0.57–1.00)
Calcium: 9.3 mg/dL (ref 8.7–10.2)
GFR calc Af Amer: 74 mL/min/{1.73_m2} (ref >59)
GFR, EST NON AFRICAN AMERICAN: 64 mL/min/{1.73_m2} (ref >59)
Glucose: 114 mg/dL — ABNORMAL HIGH (ref 65–99)
Potassium: 3.9 mmol/L (ref 3.5–5.2)
Sodium: 144 mmol/L (ref 134–144)

## 2018-10-01 LAB — PRO B NATRIURETIC PEPTIDE: NT-Pro BNP: 2536 pg/mL — ABNORMAL HIGH (ref 0–287)

## 2018-10-01 NOTE — Progress Notes (Signed)
Cardiology Office Note:    Date:  10/01/2018   ID:  Shannon Baxter, DOB May 06, 1961, MRN [id number]  PCP:  Paulina Fusi, MD  Cardiologist:  Gypsy Balsam, MD    Referring MD: Paulina Fusi, MD   Chief Complaint  Patient presents with  . 1 month follow up  I have atrial flutter again  History of Present Illness:    Shannon Baxter is a 57 y.o. female with complex past medical history which include proximal atrial flutter, previously successfully managed with amiodarone.  Also history of aortic valve replacement, cardiomyopathy ejection fraction 25 to 30%.  Recently referred to EP team for consideration of atrial flutter ablation.  Procedure has been scheduled for 15 January.  She is in atrial flutter with ventricular rate of 112.  Complaining of having weakness fatigue and tiredness.  She cannot do much without getting short of breath.  I will ask her to have a Chem-7 as well as proBNP today and she might require developing more diuresis.  I told her to let me know if she feels poorly if that is the case we may try to push towards having ablation done sooner we may even consider cardioversion.  Past Medical History:  Diagnosis Date  . Allergy   . Aortic insufficiency   . Aortic stenosis   . Arthritis   . Asthma   . CHF (congestive heart failure) (HCC)   . Claudication (HCC)   . Coronary artery disease   . Diabetes mellitus without complication (HCC)   . GERD (gastroesophageal reflux disease)   . Heart murmur   . Hyperlipidemia   . Hypertension   . Nutritional and metabolic cardiomyopathy (HCC)   . PVD (peripheral vascular disease) (HCC)   . Sleep apnea   . Stroke [facility])     Past Surgical History:  Procedure Laterality Date  . CARDIAC CATHETERIZATION    . INSERT / REPLACE / REMOVE PACEMAKER     Medtronic ICD  . MECHANICAL AORTIC VALVE REPLACEMENT    . PERIPHERAL ARTERIAL STENT GRAFT    . TUBAL LIGATION      Current Medications: Current Meds  Medication Sig  .  acetaminophen (TYLENOL) 500 MG tablet Take 1,500 mg by mouth 2 (two) times daily as needed for pain.  Marland Kitchen ALPRAZolam (XANAX) 0.5 MG tablet 1 tablet nightly as needed for sleep  . amiodarone (PACERONE) 200 MG tablet Take 1 tablet by mouth daily.  . beclomethasone (QVAR) 80 MCG/ACT inhaler Inhale into the lungs 2 (two) times daily.  . carvedilol (COREG) 25 MG tablet Take 1 tablet (25 mg total) by mouth 2 (two) times daily.  . cephALEXin (KEFLEX) 500 MG capsule Take 1 capsule (500 mg total) by mouth 3 (three) times daily.  . clopidogrel (PLAVIX) 75 MG tablet Take 75 mg by mouth daily.  . Cyanocobalamin (B-12) 500 MCG TABS Take 3 tablets by mouth daily.  . digoxin (LANOXIN) 0.125 MG tablet Take 0.0625 mg by mouth daily.  Marland Kitchen ENTRESTO 97-103 MG Take 1 tablet by mouth 2 (two) times daily.  Marland Kitchen EPIPEN 2-PAK 0.3 MG/0.3ML SOAJ injection See admin instructions.  Marland Kitchen ezetimibe (ZETIA) 10 MG tablet Take 10 mg by mouth daily.  . fluticasone (FLONASE) 50 MCG/ACT nasal spray Place 2 sprays into both nostrils daily.   . furosemide (LASIX) 20 MG tablet Alternate 2 tablets daily with 1 tablet daily  . gabapentin (NEURONTIN) 300 MG capsule 1 (ONE) CAPSULE BY MOUTH THREE TIMES DAILY  . glimepiride (AMARYL) 4 MG  tablet Take 4 mg by mouth daily.  Marland Kitchen ipratropium-albuterol (DUONEB) 0.5-2.5 (3) MG/3ML SOLN   . loratadine (CLARITIN) 10 MG tablet Take 10 mg by mouth daily.  . metFORMIN (GLUCOPHAGE) 500 MG tablet Take by mouth 2 (two) times daily with a meal.  . metoprolol (TOPROL-XL) 200 MG 24 hr tablet Take 1 tablet by mouth daily.  . mometasone (ASMANEX) 220 MCG/INH inhaler Inhale 2 puffs into the lungs daily.  . montelukast (SINGULAIR) 10 MG tablet Take 10 mg by mouth daily.  . Multiple Vitamins-Minerals (CENTRUM SILVER ULTRA WOMENS) TABS Take 1 tablet by mouth daily.  Marland Kitchen neomycin-polymyxin-hydrocortisone (CORTISPORIN) OTIC solution Apply twice daily to left 1st toenail  . nitroGLYCERIN (NITROSTAT) 0.4 MG SL tablet Place 0.4  mg under the tongue every 5 (five) minutes as needed for chest pain.  Marland Kitchen omeprazole (PRILOSEC) 20 MG capsule   . Red Yeast Rice 600 MG TABS Take 2 tablets by mouth daily.  Marland Kitchen warfarin (COUMADIN) 1 MG tablet Take 1 mg by mouth as directed.  . warfarin (COUMADIN) 5 MG tablet Take 5 mg by mouth as directed.     Allergies:   Iodinated diagnostic agents and Ioxaglate   Social History   Socioeconomic History  . Marital status: Single    Spouse name: Not on file  . Number of children: Not on file  . Years of education: Not on file  . Highest education level: Not on file  Occupational History  . Not on file  Social Needs  . Financial resource strain: Not on file  . Food insecurity:    Worry: Not on file    Inability: Not on file  . Transportation needs:    Medical: Not on file    Non-medical: Not on file  Tobacco Use  . Smoking status: Former Games developer  . Smokeless tobacco: Never Used  Substance and Sexual Activity  . Alcohol use: No  . Drug use: No  . Sexual activity: Not on file  Lifestyle  . Physical activity:    Days per week: Not on file    Minutes per session: Not on file  . Stress: Not on file  Relationships  . Social connections:    Talks on phone: Not on file    Gets together: Not on file    Attends religious service: Not on file    Active member of club or organization: Not on file    Attends meetings of clubs or organizations: Not on file    Relationship status: Not on file  Other Topics Concern  . Not on file  Social History Narrative  . Not on file     Family History: The patient's family history includes Heart disease in her brother. ROS:   Please see the history of present illness.    All 14 point review of systems negative except as described per history of present illness  EKGs/Labs/Other Studies Reviewed:      Recent Labs: 12/31/2017: BUN 14; Creatinine, Ser 0.82; NT-Pro BNP 212; Potassium 4.0; Sodium 142  Recent Lipid Panel    Component Value  Date/Time   CHOL  07/04/2007 0815    200        ATP III CLASSIFICATION:  <200     mg/dL   Desirable  269-485  mg/dL   Borderline High  >=462    mg/dL   High   TRIG 703 50/06/3817 0815   HDL 47 07/04/2007 0815   CHOLHDL 4.3 07/04/2007 0815   VLDL 28 07/04/2007  0815   LDLCALC (H) 07/04/2007 0815    125        Total Cholesterol/HDL:CHD Risk Coronary Heart Disease Risk Table                     Men   Women  1/2 Average Risk   3.4   3.3    Physical Exam:    VS:  BP 134/66   Pulse (!) 112   Ht 5\' 10"  (1.778 m)   Wt (!) 332 lb 3.2 oz (150.7 kg)   SpO2 98%   BMI 47.67 kg/m     Wt Readings from Last 3 Encounters:  10/01/18 (!) 332 lb 3.2 oz (150.7 kg)  09/29/18 (!) 331 lb (150.1 kg)  09/01/18 (!) 328 lb 9.6 oz (149.1 kg)     GEN:  Well nourished, well developed in no acute distress HEENT: Normal NECK: No JVD; No carotid bruits LYMPHATICS: No lymphadenopathy CARDIAC: RRR, systolic ejection murmur grade 1/6 to 2/6 best heard right upper portion of sternum, no rubs, no gallops RESPIRATORY:  Clear to auscultation without rales, wheezing or rhonchi  ABDOMEN: Soft, non-tender, non-distended MUSCULOSKELETAL:  No edema; No deformity  SKIN: Warm and dry LOWER EXTREMITIES: no swelling NEUROLOGIC:  Alert and oriented x 3 PSYCHIATRIC:  Normal affect   ASSESSMENT:    1. Paroxysmal atrial flutter (HCC)   2. Chronic systolic congestive heart failure (HCC)   3. Coronary artery disease involving native coronary artery of native heart without angina pectoris   4. Status post aortic valve replacement    PLAN:    In order of problems listed above:  1. Paroxysmal atrial flutter looks like she is in it.  She is anticoagulated which I will continue plan is to ablate her which will happen in January. 2. Chronic congestive heart failure appears to be mildly decompensated Chem-7 proBNP will be done today. 3. Coronary disease stable. 4. Status post aortic valve replacement  stable   Medication Adjustments/Labs and Tests Ordered: Current medicines are reviewed at length with the patient today.  Concerns regarding medicines are outlined above.  No orders of the defined types were placed in this encounter.  Medication changes: No orders of the defined types were placed in this encounter.   Signed, Georgeanna Lea, MD, [facility] 10/01/2018 11:11 AM    [facility]

## 2018-10-01 NOTE — Addendum Note (Signed)
Addended by: Lita Mains on: 10/01/2018 11:48 AM   Modules accepted: Orders

## 2018-10-01 NOTE — Patient Instructions (Signed)
Medication Instructions:  Your physician recommends that you continue on your current medications as directed. Please refer to the Current Medication list given to you today.  If you need a refill on your cardiac medications before your next appointment, please call your pharmacy.   Lab work: Your physician recommends that you return for lab work today: Bmp, Bnp   If you have labs (blood work) drawn today and your tests are completely normal, you will receive your results only by: Marland Kitchen MyChart Message (if you have MyChart) OR . A paper copy in the mail If you have any lab test that is abnormal or we need to change your treatment, we will call you to review the results.  Testing/Procedures: None.   Follow-Up: At [facility], you and your health needs are our priority.  As part of our continuing mission to provide you with exceptional heart care, we have created designated Provider Care Teams.  These Care Teams include your primary Cardiologist (physician) and Advanced Practice Providers (APPs -  Physician Assistants and Nurse Practitioners) who all work together to provide you with the care you need, when you need it. You will need a follow up appointment in 2 weeks.  Please call our office 2 months in advance to schedule this appointment.  You may see Gypsy Balsam, MD or another member of our [facility] Provider Team in [place]: Norman Herrlich, MD . Belva Crome, MD  Any Other Special Instructions Will Be Listed Below (If Applicable).

## 2018-10-02 ENCOUNTER — Telehealth: Payer: Self-pay | Admitting: Emergency Medicine

## 2018-10-02 DIAGNOSIS — I10 Essential (primary) hypertension: Secondary | ICD-10-CM

## 2018-10-02 DIAGNOSIS — Z79899 Other long term (current) drug therapy: Secondary | ICD-10-CM | POA: Diagnosis not present

## 2018-10-02 DIAGNOSIS — I4891 Unspecified atrial fibrillation: Secondary | ICD-10-CM | POA: Diagnosis not present

## 2018-10-02 DIAGNOSIS — I5023 Acute on chronic systolic (congestive) heart failure: Secondary | ICD-10-CM

## 2018-10-02 DIAGNOSIS — E1140 Type 2 diabetes mellitus with diabetic neuropathy, unspecified: Secondary | ICD-10-CM | POA: Diagnosis not present

## 2018-10-02 DIAGNOSIS — I2510 Atherosclerotic heart disease of native coronary artery without angina pectoris: Secondary | ICD-10-CM | POA: Diagnosis not present

## 2018-10-02 DIAGNOSIS — E785 Hyperlipidemia, unspecified: Secondary | ICD-10-CM | POA: Diagnosis not present

## 2018-10-02 MED ORDER — FUROSEMIDE 20 MG PO TABS: ORAL_TABLET | ORAL | 1 refills | Status: DC

## 2018-10-02 MED ORDER — SACUBITRIL-VALSARTAN 24-26 MG PO TABS: 1.0000 | ORAL_TABLET | Freq: Two times a day (BID) | ORAL | 1 refills | Status: DC

## 2018-10-02 NOTE — Telephone Encounter (Signed)
Patient informed of lab results. She was advised to start entresto back 24/26 mg twice daily, increase lasix to 40 mg twice daily, and she will be in office Monday to have labs redrawn. Per Dr. Bing Matter we will not add potassium at this time. She verbally understands.

## 2018-10-06 DIAGNOSIS — I5023 Acute on chronic systolic (congestive) heart failure: Secondary | ICD-10-CM | POA: Diagnosis not present

## 2018-10-06 DIAGNOSIS — R791 Abnormal coagulation profile: Secondary | ICD-10-CM | POA: Diagnosis not present

## 2018-10-06 DIAGNOSIS — I10 Essential (primary) hypertension: Secondary | ICD-10-CM | POA: Diagnosis not present

## 2018-10-06 LAB — PROTIME-INR

## 2018-10-07 LAB — BASIC METABOLIC PANEL
BUN/Creatinine Ratio: 12 (ref 9–23)
BUN: 13 mg/dL (ref 6–24)
CO2: 20 mmol/L (ref 20–29)
Calcium: 8.9 mg/dL (ref 8.7–10.2)
Chloride: 103 mmol/L (ref 96–106)
Creatinine, Ser: 1.07 mg/dL — ABNORMAL HIGH (ref 0.57–1.00)
GFR calc Af Amer: 67 mL/min/{1.73_m2} (ref >59)
GFR calc non Af Amer: 58 mL/min/{1.73_m2} — ABNORMAL LOW (ref >59)
Glucose: 104 mg/dL — ABNORMAL HIGH (ref 65–99)
Potassium: 4 mmol/L (ref 3.5–5.2)
Sodium: 143 mmol/L (ref 134–144)

## 2018-10-07 LAB — PRO B NATRIURETIC PEPTIDE: NT-PRO BNP: 1343 pg/mL — AB (ref 0–287)

## 2018-10-09 ENCOUNTER — Ambulatory Visit (INDEPENDENT_AMBULATORY_CARE_PROVIDER_SITE_OTHER): Payer: Medicare Other | Admitting: Sports Medicine

## 2018-10-09 ENCOUNTER — Encounter: Payer: Self-pay | Admitting: Sports Medicine

## 2018-10-09 VITALS — BP 100/59 | HR 111 | Resp 16

## 2018-10-09 DIAGNOSIS — M79672 Pain in left foot: Secondary | ICD-10-CM | POA: Diagnosis not present

## 2018-10-09 DIAGNOSIS — E1165 Type 2 diabetes mellitus with hyperglycemia: Secondary | ICD-10-CM

## 2018-10-09 DIAGNOSIS — M79671 Pain in right foot: Secondary | ICD-10-CM | POA: Diagnosis not present

## 2018-10-09 DIAGNOSIS — B351 Tinea unguium: Secondary | ICD-10-CM

## 2018-10-09 DIAGNOSIS — IMO0002 Reserved for concepts with insufficient information to code with codable children: Secondary | ICD-10-CM

## 2018-10-09 DIAGNOSIS — E1140 Type 2 diabetes mellitus with diabetic neuropathy, unspecified: Secondary | ICD-10-CM

## 2018-10-09 DIAGNOSIS — M79674 Pain in right toe(s): Secondary | ICD-10-CM

## 2018-10-09 DIAGNOSIS — M79675 Pain in left toe(s): Secondary | ICD-10-CM

## 2018-10-09 NOTE — Progress Notes (Signed)
Subjective: Shannon Baxter is a 57 y.o. female patient with history of diabetes who presents to office today complaining of long, painful nails  while ambulating in shoes; unable to trim. Patient states that the glucose reading this morning was 103 mg/dl. Patient denies any new changes in medication or new problems besides having her heart medicines changed and possibly having to undergo a heart procedure where they have to adjust her pacemaker.  Saw Dr. Fabio Asa 2 weeks ago and last A1c recorded at 6.1.  Patient Active Problem List   Diagnosis Date Noted  . Paroxysmal atrial flutter (HCC) 09/01/2018  . Chronic systolic congestive heart failure (HCC) 12/06/2016  . Morbid obesity (HCC) 12/06/2016  . Presence of automatic implantable cardioverter-defibrillator 10/23/2016  . Dilated cardiomyopathy (HCC) 09/03/2016  . Status post aortic valve replacement 03/29/2016  . Paroxysmal atrial fibrillation (HCC) 03/06/2016  . Rheumatic aortic valve insufficiency 03/05/2016  . Acute on chronic systolic heart failure (HCC) 02/21/2016  . Hypothyroidism 01/23/2016  . Aortic valve stenosis, rheumatic 11/22/2015  . Type 2 diabetes mellitus with diabetic peripheral angiopathy without gangrene, without long-term current use of insulin (HCC) 11/22/2015  . Dyspnea on exertion 10/26/2015  . Atherosclerosis of native arteries of extremities with intermittent claudication, bilateral legs (HCC) 08/04/2015  . Carotid atherosclerosis 07/26/2015  . Coronary artery disease involving native coronary artery of native heart without angina pectoris 07/26/2015  . Dyslipidemia 07/26/2015  . Essential hypertension 07/26/2015  . Intermittent claudication (HCC) 07/26/2015  . Peripheral vascular disease (HCC) 07/26/2015  . History of tobacco abuse 07/26/2015   Current Outpatient Medications on File Prior to Visit  Medication Sig Dispense Refill  . acetaminophen (TYLENOL) 500 MG tablet Take 1,500 mg by mouth 2 (two) times daily as  needed for pain.    Marland Kitchen ALPRAZolam (XANAX) 0.5 MG tablet 1 tablet nightly as needed for sleep  0  . amiodarone (PACERONE) 200 MG tablet Take 1 tablet by mouth daily.  0  . beclomethasone (QVAR) 80 MCG/ACT inhaler Inhale into the lungs 2 (two) times daily.    . carvedilol (COREG) 25 MG tablet Take 1 tablet (25 mg total) by mouth 2 (two) times daily. 180 tablet 1  . cephALEXin (KEFLEX) 500 MG capsule Take 1 capsule (500 mg total) by mouth 3 (three) times daily. 21 capsule 0  . clopidogrel (PLAVIX) 75 MG tablet Take 75 mg by mouth daily.  6  . Cyanocobalamin (B-12) 500 MCG TABS Take 3 tablets by mouth daily.    . digoxin (LANOXIN) 0.125 MG tablet Take 0.0625 mg by mouth daily.    Marland Kitchen EPIPEN 2-PAK 0.3 MG/0.3ML SOAJ injection See admin instructions.  0  . ezetimibe (ZETIA) 10 MG tablet Take 10 mg by mouth daily.    . fluticasone (FLONASE) 50 MCG/ACT nasal spray Place 2 sprays into both nostrils daily.     . furosemide (LASIX) 20 MG tablet Take 40 mg twice daily 180 tablet 1  . gabapentin (NEURONTIN) 300 MG capsule 1 (ONE) CAPSULE BY MOUTH THREE TIMES DAILY  5  . glimepiride (AMARYL) 4 MG tablet Take 4 mg by mouth daily.    Marland Kitchen ipratropium-albuterol (DUONEB) 0.5-2.5 (3) MG/3ML SOLN     . loratadine (CLARITIN) 10 MG tablet Take 10 mg by mouth daily.    . metFORMIN (GLUCOPHAGE) 500 MG tablet Take by mouth 2 (two) times daily with a meal.    . metoprolol (TOPROL-XL) 200 MG 24 hr tablet Take 1 tablet by mouth daily.    . mometasone [facility])  220 MCG/INH inhaler Inhale 2 puffs into the lungs daily.    . montelukast (SINGULAIR) 10 MG tablet Take 10 mg by mouth daily.    . Multiple Vitamins-Minerals (CENTRUM SILVER ULTRA WOMENS) TABS Take 1 tablet by mouth daily.    Marland Kitchen neomycin-polymyxin-hydrocortisone (CORTISPORIN) OTIC solution Apply twice daily to left 1st toenail 10 mL 0  . nitroGLYCERIN (NITROSTAT) 0.4 MG SL tablet Place 0.4 mg under the tongue every 5 (five) minutes as needed for chest pain.    Marland Kitchen  omeprazole (PRILOSEC) 20 MG capsule     . Red Yeast Rice 600 MG TABS Take 2 tablets by mouth daily.    . sacubitril-valsartan (ENTRESTO) 24-26 MG Take 1 tablet by mouth 2 (two) times daily. 180 tablet 1  . warfarin (COUMADIN) 1 MG tablet Take 1 mg by mouth as directed.    . warfarin (COUMADIN) 5 MG tablet Take 5 mg by mouth as directed.    . hydrALAZINE (APRESOLINE) 10 MG tablet Take 10 mg by mouth 3 (three) times daily.     No current facility-administered medications on file prior to visit.    Allergies  Allergen Reactions  . Iodinated Diagnostic Agents Hives and Rash  . Ioxaglate Hives    Recent Results (from the past 2160 hour(s))  Basic metabolic panel     Status: Abnormal   Collection Time: 10/01/18 11:23 AM  Result Value Ref Range   Glucose 114 (H) 65 - 99 mg/dL   BUN 12 6 - 24 mg/dL   Creatinine, Ser 1.61 0.57 - 1.00 mg/dL   GFR calc non Af Amer 64 >59 mL/min/1.73   GFR calc Af Amer 74 >59 mL/min/1.73   BUN/Creatinine Ratio 12 9 - 23   Sodium 144 134 - 144 mmol/L   Potassium 3.9 3.5 - 5.2 mmol/L   Chloride 105 96 - 106 mmol/L   CO2 24 20 - 29 mmol/L   Calcium 9.3 8.7 - 10.2 mg/dL  Pro b natriuretic peptide (BNP)     Status: Abnormal   Collection Time: 10/01/18 11:23 AM  Result Value Ref Range   NT-Pro BNP 2,536 (H) 0 - 287 pg/mL    Comment: The following cut-points have been suggested for the use of proBNP for the diagnostic evaluation of heart failure (HF) in patients with acute dyspnea: Modality                     Age           Optimal Cut                            (years)            Point ------------------------------------------------------ Diagnosis (rule in HF)        <50            450 pg/mL                           50 - 75            900 pg/mL                               >75           1800 pg/mL Exclusion (rule out HF)  Age independent     300 pg/mL   Basic metabolic panel  Status: Abnormal   Collection Time: 10/06/18  2:27 PM  Result Value Ref  Range   Glucose 104 (H) 65 - 99 mg/dL   BUN 13 6 - 24 mg/dL   Creatinine, Ser 7.47 (H) 0.57 - 1.00 mg/dL   GFR calc non Af Amer 58 (L) >59 mL/min/1.73   GFR calc Af Amer 67 >59 mL/min/1.73   BUN/Creatinine Ratio 12 9 - 23   Sodium 143 134 - 144 mmol/L   Potassium 4.0 3.5 - 5.2 mmol/L   Chloride 103 96 - 106 mmol/L   CO2 20 20 - 29 mmol/L   Calcium 8.9 8.7 - 10.2 mg/dL  Pro b natriuretic peptide (BNP)     Status: Abnormal   Collection Time: 10/06/18  2:27 PM  Result Value Ref Range   NT-Pro BNP 1,343 (H) 0 - 287 pg/mL    Comment: The following cut-points have been suggested for the use of proBNP for the diagnostic evaluation of heart failure (HF) in patients with acute dyspnea: Modality                     Age           Optimal Cut                            (years)            Point ------------------------------------------------------ Diagnosis (rule in HF)        <50            450 pg/mL                           50 - 75            900 pg/mL                               >75           1800 pg/mL Exclusion (rule out HF)  Age independent     300 pg/mL     Objective: General: Patient is awake, alert, and oriented x 3 and in no acute distress.  Integument: Skin is warm, dry and supple bilateral. Nails are tender, long, thickened and  dystrophic with subungual debris, consistent with onychomycosis, 1-5 bilateral with lifting of the left hallux nail.  No signs of infection. No open lesions or preulcerative lesions present bilateral. Remaining integument unremarkable.  Vasculature:  Dorsalis Pedis pulse 1/4 bilateral. Posterior Tibial pulse  0/4 bilateral.  Capillary fill time <3 sec 1-5 bilateral. Positive hair growth to the level of the digits. Temperature gradient within normal limits. No varicosities present bilateral. No edema present bilateral.   Neurology: The patient has intact sensation measured with a 5.07/10g Semmes Weinstein Monofilament at all pedal sites bilateral .  Vibratory sensation diminished bilateral with tuning fork. No Babinski sign present bilateral.   Musculoskeletal:Asymptomatic pes planus pedal deformities noted bilateral. Muscular strength 5/5 in all lower extremity muscular groups bilateral without pain on range of motion except at midfoot where there is arthritis. No tenderness with calf compression bilateral.  Assessment and Plan: Problem List Items Addressed This Visit    None    Visit Diagnoses    Type 2 diabetes, uncontrolled, with neuropathy (HCC)    -  Primary   Pain due to onychomycosis of toenails of both  feet       Foot pain, bilateral           -Examined patient. -Discussed and educated patient on diabetic foot care, especially with  regards to the vascular, neurological and musculoskeletal systems.  -Mechanically debrided all nails 1-5 bilateral using sterile nail nipper and filed with dremel without incident  -Patient to return  in 2.5 to 3 months for at risk foot care -Patient advised to call the office if any problems or questions arise in the meantime.  Asencion Islamitorya Alletta Mattos, DPM

## 2018-10-13 ENCOUNTER — Telehealth: Payer: Self-pay | Admitting: *Deleted

## 2018-10-13 ENCOUNTER — Telehealth: Payer: Self-pay | Admitting: Emergency Medicine

## 2018-10-13 DIAGNOSIS — I5023 Acute on chronic systolic (congestive) heart failure: Secondary | ICD-10-CM

## 2018-10-13 MED ORDER — FUROSEMIDE 20 MG PO TABS: ORAL_TABLET | ORAL | 1 refills | Status: DC

## 2018-10-13 NOTE — Telephone Encounter (Signed)
lmtcb   (need to discuss INR levels and possibly switching to another blood thinner for ablation procedure)

## 2018-10-13 NOTE — Telephone Encounter (Signed)
Patient informed of lab results. Advised patient to increase lasix to 60 mg in the morning and 40 mg in the evening. Patient verbally understands. She will have labs rechecked in one week.

## 2018-10-16 ENCOUNTER — Ambulatory Visit (INDEPENDENT_AMBULATORY_CARE_PROVIDER_SITE_OTHER): Payer: Medicare Other | Admitting: Cardiology

## 2018-10-16 ENCOUNTER — Encounter: Payer: Self-pay | Admitting: Cardiology

## 2018-10-16 VITALS — BP 110/70 | HR 111 | Ht 70.0 in | Wt 318.0 lb

## 2018-10-16 DIAGNOSIS — Z952 Presence of prosthetic heart valve: Secondary | ICD-10-CM

## 2018-10-16 DIAGNOSIS — I2510 Atherosclerotic heart disease of native coronary artery without angina pectoris: Secondary | ICD-10-CM

## 2018-10-16 DIAGNOSIS — Z95810 Presence of automatic (implantable) cardiac defibrillator: Secondary | ICD-10-CM

## 2018-10-16 DIAGNOSIS — I4892 Unspecified atrial flutter: Secondary | ICD-10-CM | POA: Diagnosis not present

## 2018-10-16 DIAGNOSIS — I5022 Chronic systolic (congestive) heart failure: Secondary | ICD-10-CM | POA: Diagnosis not present

## 2018-10-16 LAB — BASIC METABOLIC PANEL
BUN/Creatinine Ratio: 14 (ref 9–23)
BUN: 15 mg/dL (ref 6–24)
CO2: 22 mmol/L (ref 20–29)
Calcium: 9.3 mg/dL (ref 8.7–10.2)
Chloride: 103 mmol/L (ref 96–106)
Creatinine, Ser: 1.04 mg/dL — ABNORMAL HIGH (ref 0.57–1.00)
GFR calc Af Amer: 69 mL/min/{1.73_m2} (ref >59)
GFR calc non Af Amer: 60 mL/min/{1.73_m2} (ref >59)
Glucose: 112 mg/dL — ABNORMAL HIGH (ref 65–99)
Potassium: 3.9 mmol/L (ref 3.5–5.2)
Sodium: 143 mmol/L (ref 134–144)

## 2018-10-16 LAB — PRO B NATRIURETIC PEPTIDE: NT-Pro BNP: 1280 pg/mL — ABNORMAL HIGH (ref 0–287)

## 2018-10-16 NOTE — Progress Notes (Signed)
Cardiology Office Note:    Date:  10/16/2018   ID:  Shannon Baxter, DOB 05/10/1961, MRN [id number]  PCP:  Paulina FusiSchultz, Douglas E, MD  Cardiologist:  Gypsy Balsamobert Naava Janeway, MD    Referring MD: Paulina FusiSchultz, Douglas E, MD   Chief Complaint  Patient presents with  . 2 Week Follow-up  Doing better  History of Present Illness:    Shannon HaversRobin Moorehouse is a 57 y.o. female with complex past medical history which include cardiomyopathy with severely diminished left ventricular ejection fraction, status post aortic valve replacement, paroxysmal atrial flutter, paroxysmal atrial fibrillation comes today to my office last time I seen her she was in typical atrial flutter today she still got heart rate of 110 and EKG showed what appears to be atypical atrial flutter.  She is feeling better she lost few pounds she said she does not have appetite I am worried that may be combination of amiodarone with digoxin that make her feel that way, therefore, will discontinue her digoxin.  She lost few pounds and I hope report with this congestive heart failure is being treated she said she can lay flat with no difficulty but complained of being weak tired exhausted.  I will ask her to have Chem-7 done as well as proBNP today.  Past Medical History:  Diagnosis Date  . Allergy   . Aortic insufficiency   . Aortic stenosis   . Arthritis   . Asthma   . CHF (congestive heart failure) (HCC)   . Claudication (HCC)   . Coronary artery disease   . Diabetes mellitus without complication (HCC)   . GERD (gastroesophageal reflux disease)   . Heart murmur   . Hyperlipidemia   . Hypertension   . Nutritional and metabolic cardiomyopathy (HCC)   . PVD (peripheral vascular disease) (HCC)   . Sleep apnea   . Stroke [facility](HCC)     Past Surgical History:  Procedure Laterality Date  . CARDIAC CATHETERIZATION    . INSERT / REPLACE / REMOVE PACEMAKER     Medtronic ICD  . MECHANICAL AORTIC VALVE REPLACEMENT    . PERIPHERAL ARTERIAL STENT GRAFT    .  TUBAL LIGATION      Current Medications: Current Meds  Medication Sig  . acetaminophen (TYLENOL) 500 MG tablet Take 1,500 mg by mouth 2 (two) times daily as needed for pain.  Marland Kitchen. ALPRAZolam (XANAX) 0.5 MG tablet 1 tablet nightly as needed for sleep  . amiodarone (PACERONE) 200 MG tablet Take 1 tablet by mouth daily.  . beclomethasone (QVAR) 80 MCG/ACT inhaler Inhale into the lungs 2 (two) times daily.  . carvedilol (COREG) 25 MG tablet Take 1 tablet (25 mg total) by mouth 2 (two) times daily.  . cephALEXin (KEFLEX) 500 MG capsule Take 1 capsule (500 mg total) by mouth 3 (three) times daily.  . clopidogrel (PLAVIX) 75 MG tablet Take 75 mg by mouth daily.  . Cyanocobalamin (B-12) 500 MCG TABS Take 3 tablets by mouth daily.  . digoxin (LANOXIN) 0.125 MG tablet Take 0.0625 mg by mouth daily.  Marland Kitchen. EPIPEN 2-PAK 0.3 MG/0.3ML SOAJ injection See admin instructions.  Marland Kitchen. ezetimibe (ZETIA) 10 MG tablet Take 10 mg by mouth daily.  . fluticasone (FLONASE) 50 MCG/ACT nasal spray Place 2 sprays into both nostrils daily.   . furosemide (LASIX) 20 MG tablet Take 60 mg in the morning and 40 mg in th evening.  . gabapentin (NEURONTIN) 300 MG capsule 1 (ONE) CAPSULE BY MOUTH THREE TIMES DAILY  . glimepiride (AMARYL) 4  MG tablet Take 4 mg by mouth daily.  . hydrALAZINE (APRESOLINE) 10 MG tablet Take 10 mg by mouth 3 (three) times daily.  Marland Kitchen ipratropium-albuterol (DUONEB) 0.5-2.5 (3) MG/3ML SOLN   . levothyroxine (SYNTHROID, LEVOTHROID) 50 MCG tablet Take 50 mcg by mouth daily before breakfast.  . loratadine (CLARITIN) 10 MG tablet Take 10 mg by mouth daily.  . metFORMIN (GLUCOPHAGE) 500 MG tablet Take by mouth 2 (two) times daily with a meal.  . metoprolol (TOPROL-XL) 200 MG 24 hr tablet Take 1 tablet by mouth daily.  . mometasone (ASMANEX) 220 MCG/INH inhaler Inhale 2 puffs into the lungs daily.  . montelukast (SINGULAIR) 10 MG tablet Take 10 mg by mouth daily.  . Multiple Vitamins-Minerals (CENTRUM SILVER ULTRA  WOMENS) TABS Take 1 tablet by mouth daily.  Marland Kitchen neomycin-polymyxin-hydrocortisone (CORTISPORIN) OTIC solution Apply twice daily to left 1st toenail  . nitroGLYCERIN (NITROSTAT) 0.4 MG SL tablet Place 0.4 mg under the tongue every 5 (five) minutes as needed for chest pain.  Marland Kitchen omeprazole (PRILOSEC) 20 MG capsule   . Red Yeast Rice 600 MG TABS Take 2 tablets by mouth daily.  . sacubitril-valsartan (ENTRESTO) 24-26 MG Take 1 tablet by mouth 2 (two) times daily.  Marland Kitchen warfarin (COUMADIN) 1 MG tablet Take 1 mg by mouth as directed.  . warfarin (COUMADIN) 5 MG tablet Take 5 mg by mouth as directed.     Allergies:   Iodinated diagnostic agents and Ioxaglate   Social History   Socioeconomic History  . Marital status: Single    Spouse name: Not on file  . Number of children: Not on file  . Years of education: Not on file  . Highest education level: Not on file  Occupational History  . Not on file  Social Needs  . Financial resource strain: Not on file  . Food insecurity:    Worry: Not on file    Inability: Not on file  . Transportation needs:    Medical: Not on file    Non-medical: Not on file  Tobacco Use  . Smoking status: Former Games developer  . Smokeless tobacco: Never Used  Substance and Sexual Activity  . Alcohol use: No  . Drug use: No  . Sexual activity: Not on file  Lifestyle  . Physical activity:    Days per week: Not on file    Minutes per session: Not on file  . Stress: Not on file  Relationships  . Social connections:    Talks on phone: Not on file    Gets together: Not on file    Attends religious service: Not on file    Active member of club or organization: Not on file    Attends meetings of clubs or organizations: Not on file    Relationship status: Not on file  Other Topics Concern  . Not on file  Social History Narrative  . Not on file     Family History: The patient's family history includes Heart disease in her brother. ROS:   Please see the history of  present illness.    All 14 point review of systems negative except as described per history of present illness  EKGs/Labs/Other Studies Reviewed:      Recent Labs: 10/06/2018: BUN 13; Creatinine, Ser 1.07; NT-Pro BNP 1,343; Potassium 4.0; Sodium 143  Recent Lipid Panel    Component Value Date/Time   CHOL  07/04/2007 0815    200        ATP III CLASSIFICATION:  <200  mg/dL   Desirable  594-585  mg/dL   Borderline High  >=929    mg/dL   High   TRIG 244 62/86/3817 0815   HDL 47 07/04/2007 0815   CHOLHDL 4.3 07/04/2007 0815   VLDL 28 07/04/2007 0815   LDLCALC (H) 07/04/2007 0815    125        Total Cholesterol/HDL:CHD Risk Coronary Heart Disease Risk Table                     Men   Women  1/2 Average Risk   3.4   3.3    Physical Exam:    VS:  BP 110/70   Pulse (!) 111   Ht 5\' 10"  (1.778 m)   Wt (!) 318 lb (144.2 kg)   SpO2 98%   BMI 45.63 kg/m     Wt Readings from Last 3 Encounters:  10/16/18 (!) 318 lb (144.2 kg)  10/01/18 (!) 332 lb 3.2 oz (150.7 kg)  09/29/18 (!) 331 lb (150.1 kg)     GEN:  Well nourished, well developed in no acute distress HEENT: Normal NECK: No JVD; No carotid bruits LYMPHATICS: No lymphadenopathy CARDIAC: RRR, tachycardic, no murmurs, no rubs, no gallops RESPIRATORY:  Clear to auscultation without rales, wheezing or rhonchi  ABDOMEN: Soft, non-tender, non-distended MUSCULOSKELETAL:  No edema; No deformity  SKIN: Warm and dry LOWER EXTREMITIES: no swelling NEUROLOGIC:  Alert and oriented x 3 PSYCHIATRIC:  Normal affect   ASSESSMENT:    1. Status post aortic valve replacement   2. Chronic systolic congestive heart failure (HCC)   3. Paroxysmal atrial flutter (HCC)   4. Coronary artery disease involving native coronary artery of native heart without angina pectoris   5. Presence of automatic implantable cardioverter-defibrillator    PLAN:    In order of problems listed above:  1. Status post aortic valve replacement.  Valve  functioning normally 2. Chronic systolic congestive heart failure New York Heart Association 3 on appropriate medications she can tolerate.  Problem is her blood pressure being low. 3. Paroxysmal atrial flutter looks like atypical today she is on Pacerone which I will continue, I will discontinue her digoxin 4. Coronary artery disease stable 5. ICD present.  Noted.   Medication Adjustments/Labs and Tests Ordered: Current medicines are reviewed at length with the patient today.  Concerns regarding medicines are outlined above.  No orders of the defined types were placed in this encounter.  Medication changes: No orders of the defined types were placed in this encounter.   Signed, Georgeanna Lea, MD, [facility] 10/16/2018 11:09 AM    [facility]

## 2018-10-16 NOTE — Patient Instructions (Signed)
Medication Instructions:  Your physician has recommended you make the following change in your medication:   STOP: digoxin   If you need a refill on your cardiac medications before your next appointment, please call your pharmacy.   Lab work: Your physician recommends that you return for lab work today: Bmp, BNP  If you have labs (blood work) drawn today and your tests are completely normal, you will receive your results only by: Marland Kitchen MyChart Message (if you have MyChart) OR . A paper copy in the mail If you have any lab test that is abnormal or we need to change your treatment, we will call you to review the results.  Testing/Procedures: None.   Follow-Up: At [facility], you and your health needs are our priority.  As part of our continuing mission to provide you with exceptional heart care, we have created designated Provider Care Teams.  These Care Teams include your primary Cardiologist (physician) and Advanced Practice Providers (APPs -  Physician Assistants and Nurse Practitioners) who all work together to provide you with the care you need, when you need it. You will need a follow up appointment in 2 weeks.  Please call our office 2 months in advance to schedule this appointment.  You may see Gypsy Balsam, MD or another member of our [facility] Provider Team in [place]: Norman Herrlich, MD . Belva Crome, MD  Any Other Special Instructions Will Be Listed Below (If Applicable).

## 2018-10-20 DIAGNOSIS — R791 Abnormal coagulation profile: Secondary | ICD-10-CM | POA: Diagnosis not present

## 2018-10-20 LAB — PROTIME-INR

## 2018-10-21 ENCOUNTER — Telehealth: Payer: Self-pay | Admitting: Emergency Medicine

## 2018-10-21 DIAGNOSIS — I420 Dilated cardiomyopathy: Secondary | ICD-10-CM

## 2018-10-21 DIAGNOSIS — I10 Essential (primary) hypertension: Secondary | ICD-10-CM

## 2018-10-21 MED ORDER — FUROSEMIDE 20 MG PO TABS: ORAL_TABLET | ORAL | 1 refills | Status: DC

## 2018-10-21 MED ORDER — POTASSIUM CHLORIDE ER 20 MEQ PO TBCR: 20.0000 meq | EXTENDED_RELEASE_TABLET | Freq: Every day | ORAL | 1 refills | Status: DC

## 2018-10-21 NOTE — Telephone Encounter (Signed)
Patient advised to increase lasix to 60 mg twice daily, start potassium 20 meq daily and have labs rechecked in one week she verbally understands.

## 2018-10-24 DIAGNOSIS — G4733 Obstructive sleep apnea (adult) (pediatric): Secondary | ICD-10-CM | POA: Diagnosis not present

## 2018-10-27 DIAGNOSIS — I420 Dilated cardiomyopathy: Secondary | ICD-10-CM | POA: Diagnosis not present

## 2018-10-27 DIAGNOSIS — I10 Essential (primary) hypertension: Secondary | ICD-10-CM | POA: Diagnosis not present

## 2018-10-28 LAB — BASIC METABOLIC PANEL
BUN/Creatinine Ratio: 15 (ref 9–23)
BUN: 16 mg/dL (ref 6–24)
CO2: 23 mmol/L (ref 20–29)
Calcium: 9 mg/dL (ref 8.7–10.2)
Chloride: 102 mmol/L (ref 96–106)
Creatinine, Ser: 1.06 mg/dL — ABNORMAL HIGH (ref 0.57–1.00)
GFR calc non Af Amer: 59 mL/min/{1.73_m2} — ABNORMAL LOW (ref >59)
GFR, EST AFRICAN AMERICAN: 68 mL/min/{1.73_m2} (ref >59)
Glucose: 72 mg/dL (ref 65–99)
Potassium: 3.8 mmol/L (ref 3.5–5.2)
Sodium: 142 mmol/L (ref 134–144)

## 2018-10-29 NOTE — Telephone Encounter (Signed)
lmtcb

## 2018-10-29 NOTE — Telephone Encounter (Signed)
Pt would like to schedule ablation for next Wednesday. (her last 3 INRs WNL) Aware that I will arrange and call her by the end of the week to go over instructions. Pt agreeable to plan.

## 2018-10-30 ENCOUNTER — Ambulatory Visit (INDEPENDENT_AMBULATORY_CARE_PROVIDER_SITE_OTHER): Payer: Medicare Other | Admitting: Cardiology

## 2018-10-30 ENCOUNTER — Encounter: Payer: Self-pay | Admitting: Cardiology

## 2018-10-30 VITALS — BP 110/60 | HR 101 | Ht 70.0 in | Wt 310.6 lb

## 2018-10-30 DIAGNOSIS — Z95810 Presence of automatic (implantable) cardiac defibrillator: Secondary | ICD-10-CM

## 2018-10-30 DIAGNOSIS — I4892 Unspecified atrial flutter: Secondary | ICD-10-CM | POA: Diagnosis not present

## 2018-10-30 DIAGNOSIS — Z7901 Long term (current) use of anticoagulants: Secondary | ICD-10-CM | POA: Diagnosis not present

## 2018-10-30 DIAGNOSIS — R0609 Other forms of dyspnea: Secondary | ICD-10-CM

## 2018-10-30 DIAGNOSIS — Z952 Presence of prosthetic heart valve: Secondary | ICD-10-CM | POA: Diagnosis not present

## 2018-10-30 DIAGNOSIS — I420 Dilated cardiomyopathy: Secondary | ICD-10-CM

## 2018-10-30 LAB — PROTIME-INR

## 2018-10-30 NOTE — Patient Instructions (Signed)
Medication Instructions:  Your physician recommends that you continue on your current medications as directed. Please refer to the Current Medication list given to you today.  If you need a refill on your cardiac medications before your next appointment, please call your pharmacy.   Lab work: None.  If you have labs (blood work) drawn today and your tests are completely normal, you will receive your results only by: . MyChart Message (if you have MyChart) OR . A paper copy in the mail If you have any lab test that is abnormal or we need to change your treatment, we will call you to review the results.  Testing/Procedures: None.   Follow-Up: At [facility], you and your health needs are our priority.  As part of our continuing mission to provide you with exceptional heart care, we have created designated Provider Care Teams.  These Care Teams include your primary Cardiologist (physician) and Advanced Practice Providers (APPs -  Physician Assistants and Nurse Practitioners) who all work together to provide you with the care you need, when you need it. You will need a follow up appointment in 2 months.  Please call our office 2 months in advance to schedule this appointment.  You may see Robert Krasowski, MD or another member of our [facility] Provider Team in [place]: Brian Munley, MD . Rajan Revankar, MD  Any Other Special Instructions Will Be Listed Below (If Applicable).     

## 2018-10-30 NOTE — Progress Notes (Signed)
Cardiology Office Note:    Date:  10/30/2018   ID:  Shannon HaversRobin Klapper, DOB 02/09/1961, MRN [id number]  PCP:  Paulina FusiSchultz, Douglas E, MD  Cardiologist:  Gypsy Balsamobert Eryn Marandola, MD    Referring MD: Paulina FusiSchultz, Douglas E, MD   Chief Complaint  Patient presents with  . 2 Week Follow-up  Doing well  History of Present Illness:    Shannon Baxter is a 58 y.o. female with congestive heart failure, New York Heart Association 3, status post aortic valve replacement, cardiomyopathy with significantly diminished ejection fraction, ICD, atrial flutter comes today to my office.  She is scheduled to have atrial flutter ablation next week I want to see her a week before procedure make sure she stable enough to go to the procedure recently she had some decompensated congestive heart for but overall now she is doing much better she lost 8 pounds since I seen her last time.  She can lay flat and stay flat for all night.  Denies have any chest pain tightness squeezing pressure burning chest.  Complaining of still having weakness and fatigue.  No chest pain  Past Medical History:  Diagnosis Date  . Allergy   . Aortic insufficiency   . Aortic stenosis   . Arthritis   . Asthma   . CHF (congestive heart failure) (HCC)   . Claudication (HCC)   . Coronary artery disease   . Diabetes mellitus without complication (HCC)   . GERD (gastroesophageal reflux disease)   . Heart murmur   . Hyperlipidemia   . Hypertension   . Nutritional and metabolic cardiomyopathy (HCC)   . PVD (peripheral vascular disease) (HCC)   . Sleep apnea   . Stroke [facility](HCC)     Past Surgical History:  Procedure Laterality Date  . CARDIAC CATHETERIZATION    . INSERT / REPLACE / REMOVE PACEMAKER     Medtronic ICD  . MECHANICAL AORTIC VALVE REPLACEMENT    . PERIPHERAL ARTERIAL STENT GRAFT    . TUBAL LIGATION      Current Medications: Current Meds  Medication Sig  . acetaminophen (TYLENOL) 500 MG tablet Take 1,500 mg by mouth 2 (two) times daily as  needed for pain.  Marland Kitchen. ALPRAZolam (XANAX) 0.5 MG tablet 1 tablet nightly as needed for sleep  . amiodarone (PACERONE) 200 MG tablet Take 1 tablet by mouth daily.  . beclomethasone (QVAR) 80 MCG/ACT inhaler Inhale into the lungs 2 (two) times daily.  . carvedilol (COREG) 25 MG tablet Take 1 tablet (25 mg total) by mouth 2 (two) times daily.  . cephALEXin (KEFLEX) 500 MG capsule Take 1 capsule (500 mg total) by mouth 3 (three) times daily.  . clopidogrel (PLAVIX) 75 MG tablet Take 75 mg by mouth daily.  . Cyanocobalamin (B-12) 500 MCG TABS Take 3 tablets by mouth daily.  Marland Kitchen. EPIPEN 2-PAK 0.3 MG/0.3ML SOAJ injection See admin instructions.  Marland Kitchen. ezetimibe (ZETIA) 10 MG tablet Take 10 mg by mouth daily.  . fluticasone (FLONASE) 50 MCG/ACT nasal spray Place 2 sprays into both nostrils daily.   . furosemide (LASIX) 20 MG tablet Take 60 mg in the morning and 60 mg in th evening.  . gabapentin (NEURONTIN) 300 MG capsule 1 (ONE) CAPSULE BY MOUTH THREE TIMES DAILY  . glimepiride (AMARYL) 4 MG tablet Take 4 mg by mouth daily.  . hydrALAZINE (APRESOLINE) 10 MG tablet Take 10 mg by mouth 3 (three) times daily.  Marland Kitchen. ipratropium-albuterol (DUONEB) 0.5-2.5 (3) MG/3ML SOLN   . levothyroxine (SYNTHROID, LEVOTHROID) 50 MCG  tablet Take 50 mcg by mouth daily before breakfast.  . loratadine (CLARITIN) 10 MG tablet Take 10 mg by mouth daily.  . metFORMIN (GLUCOPHAGE) 500 MG tablet Take by mouth 2 (two) times daily with a meal.  . metoprolol (TOPROL-XL) 200 MG 24 hr tablet Take 1 tablet by mouth daily.  . mometasone (ASMANEX) 220 MCG/INH inhaler Inhale 2 puffs into the lungs daily.  . montelukast (SINGULAIR) 10 MG tablet Take 10 mg by mouth daily.  . Multiple Vitamins-Minerals (CENTRUM SILVER ULTRA WOMENS) TABS Take 1 tablet by mouth daily.  Marland Kitchen neomycin-polymyxin-hydrocortisone (CORTISPORIN) OTIC solution Apply twice daily to left 1st toenail  . nitroGLYCERIN (NITROSTAT) 0.4 MG SL tablet Place 0.4 mg under the tongue every 5  (five) minutes as needed for chest pain.  Marland Kitchen omeprazole (PRILOSEC) 20 MG capsule   . potassium chloride 20 MEQ TBCR Take 20 mEq by mouth daily.  . Red Yeast Rice 600 MG TABS Take 2 tablets by mouth daily.  . sacubitril-valsartan (ENTRESTO) 24-26 MG Take 1 tablet by mouth 2 (two) times daily.  Marland Kitchen warfarin (COUMADIN) 1 MG tablet Take 1 mg by mouth as directed.  . warfarin (COUMADIN) 5 MG tablet Take 5 mg by mouth as directed.     Allergies:   Iodinated diagnostic agents and Ioxaglate   Social History   Socioeconomic History  . Marital status: Single    Spouse name: Not on file  . Number of children: Not on file  . Years of education: Not on file  . Highest education level: Not on file  Occupational History  . Not on file  Social Needs  . Financial resource strain: Not on file  . Food insecurity:    Worry: Not on file    Inability: Not on file  . Transportation needs:    Medical: Not on file    Non-medical: Not on file  Tobacco Use  . Smoking status: Former Games developer  . Smokeless tobacco: Never Used  Substance and Sexual Activity  . Alcohol use: No  . Drug use: No  . Sexual activity: Not on file  Lifestyle  . Physical activity:    Days per week: Not on file    Minutes per session: Not on file  . Stress: Not on file  Relationships  . Social connections:    Talks on phone: Not on file    Gets together: Not on file    Attends religious service: Not on file    Active member of club or organization: Not on file    Attends meetings of clubs or organizations: Not on file    Relationship status: Not on file  Other Topics Concern  . Not on file  Social History Narrative  . Not on file     Family History: The patient's family history includes Heart disease in her brother. ROS:   Please see the history of present illness.    All 14 point review of systems negative except as described per history of present illness  EKGs/Labs/Other Studies Reviewed:      Recent  Labs: 10/16/2018: NT-Pro BNP 1,280 10/27/2018: BUN 16; Creatinine, Ser 1.06; Potassium 3.8; Sodium 142  Recent Lipid Panel    Component Value Date/Time   CHOL  07/04/2007 0815    200        ATP III CLASSIFICATION:  <200     mg/dL   Desirable  245-809  mg/dL   Borderline High  >=983    mg/dL   High  TRIG 139 07/04/2007 0815   HDL 47 07/04/2007 0815   CHOLHDL 4.3 07/04/2007 0815   VLDL 28 07/04/2007 0815   LDLCALC (H) 07/04/2007 0815    125        Total Cholesterol/HDL:CHD Risk Coronary Heart Disease Risk Table                     Men   Women  1/2 Average Risk   3.4   3.3    Physical Exam:    VS:  BP 110/60   Pulse (!) 101   Ht 5\' 10"  (1.778 m)   Wt (!) 310 lb 9.6 oz (140.9 kg)   SpO2 97%   BMI 44.57 kg/m     Wt Readings from Last 3 Encounters:  10/30/18 (!) 310 lb 9.6 oz (140.9 kg)  10/16/18 (!) 318 lb (144.2 kg)  10/01/18 (!) 332 lb 3.2 oz (150.7 kg)     GEN:  Well nourished, well developed in no acute distress HEENT: Normal NECK: No JVD; No carotid bruits LYMPHATICS: No lymphadenopathy CARDIAC: RRR, mechanical valve sounds are present and crisp, no rubs, no gallops RESPIRATORY:  Clear to auscultation without rales, wheezing or rhonchi  ABDOMEN: Soft, non-tender, non-distended MUSCULOSKELETAL:  No edema; No deformity  SKIN: Warm and dry LOWER EXTREMITIES: no swelling NEUROLOGIC:  Alert and oriented x 3 PSYCHIATRIC:  Normal affect   ASSESSMENT:    1. Status post aortic valve replacement   2. Dyspnea on exertion   3. Presence of automatic implantable cardioverter-defibrillator   4. Dilated cardiomyopathy (HCC)   5. Paroxysmal atrial flutter (HCC)    PLAN:    In order of problems listed above:  1. Status post aortic valve replacement doing well from that point review continue present management. 2. Dyspnea on exertion still present but improved hopefully will be done better after issue of atrial flutter will be taking care of 3. Presence of ICD followed  by EP team. 4. Dilated cardiomyopathy on all appropriate medications which I will continue for now. 5. Overall she is doing well from my standpoint of view she is getting ready to have atrial flutter ablation next week and from my standpoint view she stable to have it done.  Recently I stopped her digoxin because of complaining of nausea and vomiting and dizziness however in spite of that she is not feeling any better I will ask her to go back on digoxin.   Medication Adjustments/Labs and Tests Ordered: Current medicines are reviewed at length with the patient today.  Concerns regarding medicines are outlined above.  No orders of the defined types were placed in this encounter.  Medication changes: No orders of the defined types were placed in this encounter.   Signed, Georgeanna Lea, MD, [facility] 10/30/2018 8:38 AM    [facility]

## 2018-10-31 ENCOUNTER — Telehealth: Payer: Self-pay | Admitting: Cardiology

## 2018-10-31 NOTE — Telephone Encounter (Signed)
Follow up:   Patient returning call back,please call patient.

## 2018-10-31 NOTE — Telephone Encounter (Signed)
Procedure instructions reviewed w/ patient. Procedure scheduled for 1/15. Pt will have pre procedure lab work on Monday at her PCP office (this includes Coumadin check). Post procedure follow up scheduled for 3/16 in [place]. Patient verbalized understanding and agreeable to plan.

## 2018-10-31 NOTE — Telephone Encounter (Signed)
lmtcb  (also spoke w/ Gunnar Fusi at her PCP office who checks her Coumadin.  They will draw CBC & BMP on Monday when pt is there getting her next Coumadin check.  Will follow up with their office on Monday).

## 2018-11-01 DIAGNOSIS — G4733 Obstructive sleep apnea (adult) (pediatric): Secondary | ICD-10-CM | POA: Diagnosis not present

## 2018-11-03 DIAGNOSIS — I480 Paroxysmal atrial fibrillation: Secondary | ICD-10-CM | POA: Diagnosis not present

## 2018-11-03 DIAGNOSIS — R791 Abnormal coagulation profile: Secondary | ICD-10-CM | POA: Diagnosis not present

## 2018-11-03 LAB — PROTIME-INR

## 2018-11-04 NOTE — Anesthesia Preprocedure Evaluation (Addendum)
Anesthesia Evaluation  Patient identified by MRN, date of birth, ID band Patient awake    Reviewed: Allergy & Precautions, NPO status , Patient's Chart, lab work & pertinent test results, reviewed documented beta blocker date and time   History of Anesthesia Complications Negative for: history of anesthetic complications  Airway Mallampati: III  TM Distance: >3 FB Neck ROM: Full    Dental  (+) Missing, Dental Advisory Given, Partial Upper, Partial Lower   Pulmonary asthma , sleep apnea and Continuous Positive Airway Pressure Ventilation , former smoker,    breath sounds clear to auscultation       Cardiovascular hypertension, Pt. on medications and Pt. on home beta blockers + Peripheral Vascular Disease and +CHF  + dysrhythmias Atrial Fibrillation + Valvular Problems/Murmurs (s/p mechanical AVR) AS  Rhythm:Irregular Rate:Normal     Neuro/Psych CVA, Residual Symptoms negative psych ROS   GI/Hepatic Neg liver ROS, GERD  Medicated and Controlled,  Endo/Other  diabetes, Type 2, Oral Hypoglycemic AgentsHypothyroidism Morbid obesity  Renal/GU negative Renal ROS  negative genitourinary   Musculoskeletal  (+) Arthritis , Osteoarthritis,    Abdominal   Peds  Hematology negative hematology ROS (+)   Anesthesia Other Findings 58 yo F for A flutter ablation - PMH: CVA, asthma, OSA, HTN, CHF (s/p ICD placement), AS s/p AVR, A flutter, PVD, GERD, DM, hypothyroid, BMI 44 - TTE 03/10/18: EF 35-40%, mild MR & TR, mechanical aortic valve functioning normally - LHC 2017: normal coronary arteries  Reproductive/Obstetrics                           Anesthesia Physical Anesthesia Plan  ASA: IV  Anesthesia Plan: General   Post-op Pain Management:    Induction: Intravenous  PONV Risk Score and Plan: 3 and Ondansetron, Dexamethasone, Midazolam and Treatment may vary due to age or medical condition  Airway  Management Planned: Oral ETT  Additional Equipment: Arterial line  Intra-op Plan:   Post-operative Plan: Extubation in OR  Informed Consent: I have reviewed the patients History and Physical, chart, labs and discussed the procedure including the risks, benefits and alternatives for the proposed anesthesia with the patient or authorized representative who has indicated his/her understanding and acceptance.     Dental advisory given  Plan Discussed with: CRNA, Anesthesiologist and Surgeon  Anesthesia Plan Comments:      Anesthesia Quick Evaluation

## 2018-11-05 ENCOUNTER — Ambulatory Visit (HOSPITAL_COMMUNITY): Payer: Medicare Other | Admitting: Anesthesiology

## 2018-11-05 ENCOUNTER — Encounter (HOSPITAL_COMMUNITY)
Admission: RE | Disposition: A | Discharge status: Home / Self Care | Payer: Self-pay | Source: Home / Self Care | Attending: Cardiology

## 2018-11-05 ENCOUNTER — Other Ambulatory Visit: Payer: Self-pay

## 2018-11-05 ENCOUNTER — Encounter (HOSPITAL_COMMUNITY): Payer: Self-pay | Admitting: Certified Registered Nurse Anesthetist

## 2018-11-05 ENCOUNTER — Ambulatory Visit (HOSPITAL_COMMUNITY)
Admission: RE | Admit: 2018-11-05 | Discharge: 2018-11-05 | Disposition: A | Discharge status: Home / Self Care | Payer: Medicare Other | Attending: Cardiology | Admitting: Cardiology

## 2018-11-05 DIAGNOSIS — I4891 Unspecified atrial fibrillation: Secondary | ICD-10-CM | POA: Diagnosis not present

## 2018-11-05 DIAGNOSIS — I110 Hypertensive heart disease with heart failure: Secondary | ICD-10-CM | POA: Diagnosis not present

## 2018-11-05 DIAGNOSIS — I509 Heart failure, unspecified: Secondary | ICD-10-CM | POA: Insufficient documentation

## 2018-11-05 DIAGNOSIS — Z9851 Tubal ligation status: Secondary | ICD-10-CM | POA: Insufficient documentation

## 2018-11-05 DIAGNOSIS — E785 Hyperlipidemia, unspecified: Secondary | ICD-10-CM | POA: Diagnosis not present

## 2018-11-05 DIAGNOSIS — Z79899 Other long term (current) drug therapy: Secondary | ICD-10-CM | POA: Diagnosis not present

## 2018-11-05 DIAGNOSIS — Z7901 Long term (current) use of anticoagulants: Secondary | ICD-10-CM | POA: Diagnosis not present

## 2018-11-05 DIAGNOSIS — Z7902 Long term (current) use of antithrombotics/antiplatelets: Secondary | ICD-10-CM | POA: Insufficient documentation

## 2018-11-05 DIAGNOSIS — Z95820 Peripheral vascular angioplasty status with implants and grafts: Secondary | ICD-10-CM | POA: Insufficient documentation

## 2018-11-05 DIAGNOSIS — G4733 Obstructive sleep apnea (adult) (pediatric): Secondary | ICD-10-CM | POA: Diagnosis not present

## 2018-11-05 DIAGNOSIS — I428 Other cardiomyopathies: Secondary | ICD-10-CM | POA: Diagnosis not present

## 2018-11-05 DIAGNOSIS — Z87891 Personal history of nicotine dependence: Secondary | ICD-10-CM | POA: Insufficient documentation

## 2018-11-05 DIAGNOSIS — I4892 Unspecified atrial flutter: Secondary | ICD-10-CM | POA: Diagnosis not present

## 2018-11-05 DIAGNOSIS — I483 Typical atrial flutter: Secondary | ICD-10-CM | POA: Diagnosis not present

## 2018-11-05 DIAGNOSIS — E1151 Type 2 diabetes mellitus with diabetic peripheral angiopathy without gangrene: Secondary | ICD-10-CM | POA: Diagnosis not present

## 2018-11-05 DIAGNOSIS — Z79890 Hormone replacement therapy (postmenopausal): Secondary | ICD-10-CM | POA: Insufficient documentation

## 2018-11-05 DIAGNOSIS — Z888 Allergy status to other drugs, medicaments and biological substances status: Secondary | ICD-10-CM | POA: Diagnosis not present

## 2018-11-05 DIAGNOSIS — K219 Gastro-esophageal reflux disease without esophagitis: Secondary | ICD-10-CM | POA: Insufficient documentation

## 2018-11-05 DIAGNOSIS — Z7984 Long term (current) use of oral hypoglycemic drugs: Secondary | ICD-10-CM | POA: Diagnosis not present

## 2018-11-05 DIAGNOSIS — I2510 Atherosclerotic heart disease of native coronary artery without angina pectoris: Secondary | ICD-10-CM | POA: Insufficient documentation

## 2018-11-05 DIAGNOSIS — Z6841 Body Mass Index (BMI) 40.0 and over, adult: Secondary | ICD-10-CM | POA: Insufficient documentation

## 2018-11-05 DIAGNOSIS — Z950 Presence of cardiac pacemaker: Secondary | ICD-10-CM | POA: Insufficient documentation

## 2018-11-05 DIAGNOSIS — M1990 Unspecified osteoarthritis, unspecified site: Secondary | ICD-10-CM | POA: Insufficient documentation

## 2018-11-05 DIAGNOSIS — Z8673 Personal history of transient ischemic attack (TIA), and cerebral infarction without residual deficits: Secondary | ICD-10-CM | POA: Diagnosis not present

## 2018-11-05 DIAGNOSIS — Z8249 Family history of ischemic heart disease and other diseases of the circulatory system: Secondary | ICD-10-CM | POA: Insufficient documentation

## 2018-11-05 DIAGNOSIS — Z952 Presence of prosthetic heart valve: Secondary | ICD-10-CM | POA: Insufficient documentation

## 2018-11-05 HISTORY — PX: A-FLUTTER ABLATION: EP1230

## 2018-11-05 LAB — PROTIME-INR
INR: 2.37
Prothrombin Time: 25.6 seconds — ABNORMAL HIGH (ref 11.4–15.2)

## 2018-11-05 LAB — GLUCOSE, CAPILLARY
Glucose-Capillary: 106 mg/dL — ABNORMAL HIGH (ref 70–99)
Glucose-Capillary: 99 mg/dL (ref 70–99)
Glucose-Capillary: 89 mg/dL (ref 70–99)

## 2018-11-05 LAB — CBC
HCT: 38.4 % (ref 36.0–46.0)
Hemoglobin: 11.8 g/dL — ABNORMAL LOW (ref 12.0–15.0)
MCH: 25.5 pg — AB (ref 26.0–34.0)
MCHC: 30.7 g/dL (ref 30.0–36.0)
MCV: 83.1 fL (ref 80.0–100.0)
Platelets: 241 10*3/uL (ref 150–400)
RBC: 4.62 MIL/uL (ref 3.87–5.11)
RDW: 16.3 % — ABNORMAL HIGH (ref 11.5–15.5)
WBC: 6.1 10*3/uL (ref 4.0–10.5)
nRBC: 0 % (ref 0.0–0.2)

## 2018-11-05 SURGERY — A-FLUTTER ABLATION
Anesthesia: General

## 2018-11-05 MED ORDER — HEPARIN (PORCINE) IN NACL 1000-0.9 UT/500ML-% IV SOLN
INTRAVENOUS | Status: DC | PRN
  Administered 2018-11-05 (×2): 500 mL

## 2018-11-05 MED ORDER — BUPIVACAINE HCL (PF) 0.25 % IJ SOLN
INTRAMUSCULAR | Status: DC | PRN
  Administered 2018-11-05: 30 mL

## 2018-11-05 MED ORDER — FENTANYL CITRATE (PF) 100 MCG/2ML IJ SOLN
INTRAMUSCULAR | Status: DC | PRN
  Administered 2018-11-05: 100 ug via INTRAVENOUS

## 2018-11-05 MED ORDER — SODIUM CHLORIDE 0.9 % IV SOLN
INTRAVENOUS | Status: DC | PRN
  Administered 2018-11-05: 40 ug/min via INTRAVENOUS

## 2018-11-05 MED ORDER — SODIUM CHLORIDE 0.9% FLUSH: 3.0000 mL | INTRAVENOUS | Status: DC | PRN

## 2018-11-05 MED ORDER — LIDOCAINE HCL (CARDIAC) PF 100 MG/5ML IV SOSY
PREFILLED_SYRINGE | INTRAVENOUS | Status: DC | PRN
  Administered 2018-11-05: 100 mg via INTRAVENOUS

## 2018-11-05 MED ORDER — BUPIVACAINE HCL (PF) 0.25 % IJ SOLN
INTRAMUSCULAR | Status: AC
  Filled 2018-11-05: qty 30

## 2018-11-05 MED ORDER — SODIUM CHLORIDE 0.9 % IV SOLN: INTRAVENOUS | Status: DC

## 2018-11-05 MED ORDER — DEXTROSE 5 % IV SOLN
3.0000 g | INTRAVENOUS | Status: AC
  Administered 2018-11-05: 3 g via INTRAVENOUS
  Filled 2018-11-05: qty 3

## 2018-11-05 MED ORDER — ACETAMINOPHEN 325 MG PO TABS
650.0000 mg | ORAL_TABLET | ORAL | Status: DC | PRN
  Filled 2018-11-05: qty 2

## 2018-11-05 MED ORDER — HEPARIN (PORCINE) IN NACL 1000-0.9 UT/500ML-% IV SOLN
INTRAVENOUS | Status: AC
  Filled 2018-11-05: qty 500

## 2018-11-05 MED ORDER — SODIUM CHLORIDE 0.9 % IV SOLN: 250.0000 mL | INTRAVENOUS | Status: DC | PRN

## 2018-11-05 MED ORDER — SUCCINYLCHOLINE CHLORIDE 200 MG/10ML IV SOSY
PREFILLED_SYRINGE | INTRAVENOUS | Status: DC | PRN
  Administered 2018-11-05: 160 mg via INTRAVENOUS

## 2018-11-05 MED ORDER — LACTATED RINGERS IV SOLN: INTRAVENOUS | Status: DC | PRN

## 2018-11-05 MED ORDER — SODIUM CHLORIDE 0.9% FLUSH: 3.0000 mL | Freq: Two times a day (BID) | INTRAVENOUS | Status: DC

## 2018-11-05 MED ORDER — ASPIRIN 81 MG PO CHEW: 81.0000 mg | CHEWABLE_TABLET | ORAL | Status: DC

## 2018-11-05 MED ORDER — ROCURONIUM BROMIDE 10 MG/ML (PF) SYRINGE
PREFILLED_SYRINGE | INTRAVENOUS | Status: DC | PRN
  Administered 2018-11-05: 20 mg via INTRAVENOUS
  Administered 2018-11-05: 50 mg via INTRAVENOUS
  Administered 2018-11-05: 10 mg via INTRAVENOUS

## 2018-11-05 MED ORDER — PROPOFOL 10 MG/ML IV BOLUS
INTRAVENOUS | Status: DC | PRN
  Administered 2018-11-05: 120 mg via INTRAVENOUS

## 2018-11-05 MED ORDER — ONDANSETRON HCL 4 MG/2ML IJ SOLN: 4.0000 mg | Freq: Four times a day (QID) | INTRAMUSCULAR | Status: DC | PRN

## 2018-11-05 MED ORDER — ONDANSETRON HCL 4 MG/2ML IJ SOLN
INTRAMUSCULAR | Status: DC | PRN
  Administered 2018-11-05: 4 mg via INTRAVENOUS

## 2018-11-05 MED ORDER — SODIUM CHLORIDE 0.9 % IV SOLN
INTRAVENOUS | Status: DC
  Administered 2018-11-05: 07:00:00 via INTRAVENOUS

## 2018-11-05 MED ORDER — SUGAMMADEX SODIUM 200 MG/2ML IV SOLN
INTRAVENOUS | Status: DC | PRN
  Administered 2018-11-05: 200 mg via INTRAVENOUS

## 2018-11-05 SURGICAL SUPPLY — 14 items
BAG SNAP BAND KOVER 36X36 (MISCELLANEOUS) ×1 IMPLANT
BLANKET WARM UNDERBOD FULL ACC (MISCELLANEOUS) ×1 IMPLANT
CATH EZ STEER NAV 8MM F-J CUR (ABLATOR) ×1 IMPLANT
CATH JOSEPH QUAD ALLRED 6F REP (CATHETERS) ×1 IMPLANT
CATH WEBSTER BI DIR CS D-F CRV (CATHETERS) ×1 IMPLANT
HOVERMATT SINGLE USE (MISCELLANEOUS) ×1 IMPLANT
PACK EP LATEX FREE (CUSTOM PROCEDURE TRAY) ×1
PACK EP LF (CUSTOM PROCEDURE TRAY) ×1 IMPLANT
PAD PRO RADIOLUCENT 2001M-C (PAD) ×2 IMPLANT
PATCH CARTO3 (PAD) ×1 IMPLANT
SHEATH PINNACLE 6F 10CM (SHEATH) ×1 IMPLANT
SHEATH PINNACLE 7F 10CM (SHEATH) ×1 IMPLANT
SHEATH PINNACLE 8F 10CM (SHEATH) ×1 IMPLANT
SHIELD RADPAD SCOOP 12X17 (MISCELLANEOUS) ×1 IMPLANT

## 2018-11-05 NOTE — Telephone Encounter (Signed)
Dr. Elberta Fortis made aware this morning that INRs were WNL for procedure.  He understands that lab work will be scanned into epic.

## 2018-11-05 NOTE — H&P (Signed)
Electrophysiology Office Note   Date:  11/05/2018   ID:  Shannon Baxter, DOB [birth year], MRN [id number]  PCP:  Paulina Fusi, MD  Cardiologist:  Bing Matter Primary Electrophysiologist:  Benita Boonstra Jorja Loa, MD    No chief complaint on file.    History of Present Illness: Shannon Baxter is a 58 y.o. female who is being seen today for the evaluation of atrial fibrillation, CHF at the request of Gypsy Balsam. Presenting today for electrophysiology evaluation.  Has a history of cardiomyopathy with an ejection fraction of 25 to 30% status post aortic valve replacement with mechanical valve, paroxysmal atrial flutter, type 2 diabetes, morbid obesity, hypertension, hyperlipidemia.  She was put on amiodarone previously and converted to sinus rhythm.  At that time, she is continued to have weakness, fatigue, and shortness of breath.  Today, denies symptoms of palpitations, chest pain, shortness of breath, orthopnea, PND, lower extremity edema, claudication, dizziness, presyncope, syncope, bleeding, or neurologic sequela. The patient is tolerating medications without difficulties. Plan for atrial flutter ablation today.    Past Medical History:  Diagnosis Date  . Allergy   . Aortic insufficiency   . Aortic stenosis   . Arthritis   . Asthma   . CHF (congestive heart failure) (HCC)   . Claudication (HCC)   . Coronary artery disease   . Diabetes mellitus without complication (HCC)   . GERD (gastroesophageal reflux disease)   . Heart murmur   . Hyperlipidemia   . Hypertension   . Nutritional and metabolic cardiomyopathy (HCC)   . PVD (peripheral vascular disease) (HCC)   . Sleep apnea   . Stroke [facility])    Past Surgical History:  Procedure Laterality Date  . CARDIAC CATHETERIZATION    . INSERT / REPLACE / REMOVE PACEMAKER     Medtronic ICD  . MECHANICAL AORTIC VALVE REPLACEMENT    . PERIPHERAL ARTERIAL STENT GRAFT    . TUBAL LIGATION       No current facility-administered  medications for this encounter.     Allergies:   Iodinated diagnostic agents and Ioxaglate   Social History:  The patient  reports that she has quit smoking. She has never used smokeless tobacco. She reports that she does not drink alcohol or use drugs.   Family History:  The patient's family history includes Heart disease in her brother.    ROS:  Please see the history of present illness.   Otherwise, review of systems is positive for none.   All other systems are reviewed and negative.   PHYSICAL EXAM: VS:  BP (!) 128/97   Pulse (!) 102   Temp (!) 97.4 F (36.3 C) (Oral)   Resp 18   Ht 5\' 10"  (1.778 m)   Wt (!) 149 kg   SpO2 100%   BMI 47.13 kg/m  , BMI Body mass index is 47.13 kg/m. GEN: Well nourished, well developed, in no acute distress  HEENT: normal  Neck: no JVD, carotid bruits, or masses Cardiac: RRR; no murmurs, rubs, or gallops,no edema  Respiratory:  clear to auscultation bilaterally, normal work of breathing GI: soft, nontender, nondistended, + BS MS: no deformity or atrophy  Skin: warm and dry Neuro:  Strength and sensation are intact Psych: euthymic mood, full affect  Recent Labs: 10/16/2018: NT-Pro BNP 1,280 10/27/2018: BUN 16; Creatinine, Ser 1.06; Potassium 3.8; Sodium 142    Lipid Panel     Component Value Date/Time   CHOL  07/04/2007 0815    200  ATP III CLASSIFICATION:  <200     mg/dL   Desirable  628-315  mg/dL   Borderline High  >=176    mg/dL   High   TRIG 160 73/71/0626 0815   HDL 47 07/04/2007 0815   CHOLHDL 4.3 07/04/2007 0815   VLDL 28 07/04/2007 0815   LDLCALC (H) 07/04/2007 0815    125        Total Cholesterol/HDL:CHD Risk Coronary Heart Disease Risk Table                     Men   Women  1/2 Average Risk   3.4   3.3     Wt Readings from Last 3 Encounters:  11/05/18 (!) 149 kg  10/30/18 (!) 140.9 kg  10/16/18 (!) 144.2 kg      Other studies Reviewed: Additional studies/ records that were reviewed today include:  TTE 11/21/17  Review of the above records today demonstrates:  Mild concentric LVH, EF 30% Normal left atrial size mild to moderate mitral regurgitation Mild to moderate aortic stenosis    ASSESSMENT AND PLAN:  1.  Typical atrial flutter: plan for ablation  Shannon Baxter has presented today for surgery, with the diagnosis of atrial flutter.  The various methods of treatment have been discussed with the patient and family. After consideration of risks, benefits and other options for treatment, the patient has consented to  Procedure(s): Catheter ablation as a surgical intervention .  Risks include but not limited to bleeding, tamponade, heart block, stroke, damage to surrounding organs, among others. The patient's history has been reviewed, patient examined, no change in status, stable for surgery.  I have reviewed the patient's chart and labs.  Questions were answered to the patient's satisfaction.    This patients CHA2DS2-VASc Score and unadjusted Ischemic Stroke Rate (% per year) is equal to 2.2 % stroke rate/year from a score of 2  Above score calculated as 1 point each if present [CHF, HTN, DM, Vascular=MI/PAD/Aortic Plaque, Age if 65-74, or Female] Above score calculated as 2 points each if present [Age > 75, or Stroke/TIA/TE]  2.  Nonischemic cardiomyopathy: Status post Medtronic ICD.  Device functioning appropriately.  No changes.  3.  Hypertension: Currently well controlled  4.  Aortic valve replacement: Functioning appropriately on most recent echo.  No changes.  Lan per primary cardiology  5.  Obstructive sleep apnea: Encouraged CPAP compliance    Signed, Jaice Digioia Jorja Loa, MD  11/05/2018 7:07 AM

## 2018-11-05 NOTE — Anesthesia Procedure Notes (Signed)
Procedure Name: Intubation Date/Time: 11/05/2018 9:10 AM Performed by: Carney Living, CRNA Pre-anesthesia Checklist: Patient identified, Emergency Drugs available, Suction available, Patient being monitored and Timeout performed Patient Re-evaluated:Patient Re-evaluated prior to induction Oxygen Delivery Method: Circle system utilized Preoxygenation: Pre-oxygenation with 100% oxygen Induction Type: IV induction Laryngoscope Size: Mac and 4 Grade View: Grade I Tube type: Oral Tube size: 7.5 mm Number of attempts: 1 Airway Equipment and Method: Stylet Placement Confirmation: ETT inserted through vocal cords under direct vision,  positive ETCO2 and breath sounds checked- equal and bilateral Secured at: 22 cm Tube secured with: Tape Dental Injury: Teeth and Oropharynx as per pre-operative assessment

## 2018-11-05 NOTE — Progress Notes (Signed)
Patient seen and examined. Shannon Baxter continue bed rest for total of 6 hours. Discharge instructions discussed. Plan for discharge when bedrest over.  Shayon Trompeter Elberta Fortis, MD 11/05/2018 2:32 PM

## 2018-11-05 NOTE — Discharge Instructions (Signed)

## 2018-11-05 NOTE — CV Procedure (Signed)
    6, 7, and 8 Fr. Sheaths were pulled from R Femoral Vein, and manual pressure was held for 30 min.  Bed rest starts at 1230 x 6 hr. Instructions were given to the patient about the bed rest. Procedure was to;erated well. Arterial sheath was also pulled from the L Radial artery, and manual pressure was held for 15 min.  BP - 121/77 HR - 83 sPO2 - 97 % on R/A

## 2018-11-05 NOTE — Anesthesia Postprocedure Evaluation (Signed)
Anesthesia Post Note  Patient: Tichina Jobst  Procedure(s) Performed: A-FLUTTER ABLATION (N/A )     Patient location during evaluation: PACU Anesthesia Type: General Level of consciousness: awake and alert Pain management: pain level controlled Vital Signs Assessment: post-procedure vital signs reviewed and stable Respiratory status: spontaneous breathing, nonlabored ventilation and respiratory function stable Cardiovascular status: blood pressure returned to baseline and stable Postop Assessment: no apparent nausea or vomiting Anesthetic complications: no    Last Vitals:  Vitals:   11/05/18 1110 11/05/18 1115  BP: (!) 109/53 97/63  Pulse: 60 60  Resp: 18 14  Temp:    SpO2: 96% 100%    Last Pain:  Vitals:   11/05/18 1115  TempSrc:   PainSc: 0-No pain                 Lucretia Kern

## 2018-11-05 NOTE — Transfer of Care (Signed)
Immediate Anesthesia Transfer of Care Note  Patient: Shannon Baxter  Procedure(s) Performed: A-FLUTTER ABLATION (N/A )  Patient Location: Cath Lab  Anesthesia Type:General  Level of Consciousness: awake, alert , oriented and patient cooperative  Airway & Oxygen Therapy: Patient Spontanous Breathing and Patient connected to nasal cannula oxygen  Post-op Assessment: Report given to RN, Post -op Vital signs reviewed and stable and Patient moving all extremities X 4  Post vital signs: Reviewed and stable  Last Vitals:  Vitals Value Taken Time  BP 99/63 11/05/2018 11:01 AM  Temp    Pulse 62 11/05/2018 11:04 AM  Resp 14 11/05/2018 11:04 AM  SpO2 96 % 11/05/2018 11:04 AM  Vitals shown include unvalidated device data.  Last Pain:  Vitals:   11/05/18 0732  TempSrc:   PainSc: 1       Patients Stated Pain Goal: 1 (11/05/18 0732)  Complications: No apparent anesthesia complications

## 2018-11-05 NOTE — Progress Notes (Signed)
Client up and walked and tolerated well; right groin stable, no bleeding or hematoma 

## 2018-11-05 NOTE — Anesthesia Procedure Notes (Signed)
Arterial Line Insertion Start/End1/15/2020 8:20 AM, 11/05/2018 8:40 AM Performed by: Rogelia Boga, CRNA, CRNA  Patient location: Pre-op. Preanesthetic checklist: patient identified, IV checked, monitors and equipment checked, pre-op evaluation and timeout performed Lidocaine 1% used for infiltration Left, radial was placed Catheter size: 20 G Maximum sterile barriers used   Attempts: 2 Procedure performed without using ultrasound guided technique. Following insertion, dressing applied and Biopatch. Post procedure assessment: normal and unchanged  Patient tolerated the procedure well with no immediate complications.

## 2018-11-06 ENCOUNTER — Encounter (HOSPITAL_COMMUNITY): Payer: Self-pay | Admitting: Cardiology

## 2018-11-10 DIAGNOSIS — R791 Abnormal coagulation profile: Secondary | ICD-10-CM | POA: Diagnosis not present

## 2018-11-13 ENCOUNTER — Telehealth: Payer: Self-pay | Admitting: Cardiology

## 2018-11-13 NOTE — Telephone Encounter (Signed)
Waited on hold for >20 min--able to speak with Vernona Rieger in [facility]. She released patient in [facility].   Patient received 35J shock for VT in VF zone on 11/10/18, avg V rate 250bpm.

## 2018-11-13 NOTE — Telephone Encounter (Signed)
Patient called stating that the old office of [facility] is calling her stating that her device is going off and they called her.. Please call patient she os confused and just had cardioversion last week.

## 2018-11-13 NOTE — Telephone Encounter (Signed)
Pt reports that she did not feel a shock Monday. Denies feeling any abnormal rhythm either. She only reports feeling weak since ablation. Pt made aware that I will review her transmission w/ Camnitz tomorrow and call her w/ advisement. Pt agreeable

## 2018-11-14 NOTE — Telephone Encounter (Signed)
Pt aware to keep her appt on 2/10 w/ Camnitz to discuss further. Pt agreeable to plan.

## 2018-11-17 DIAGNOSIS — R791 Abnormal coagulation profile: Secondary | ICD-10-CM | POA: Diagnosis not present

## 2018-11-17 NOTE — Telephone Encounter (Addendum)
Pt agreeable to sooner appt per Camnitz. Pt appt scheduled for 2/3 and agreeable to plan. She is aware her appt is at noon  Late entry:  Pt was instructed last week no driving x 45mo per DMV guidelines unless Dr. Elberta Fortis tells her differently.

## 2018-11-18 DIAGNOSIS — Z9181 History of falling: Secondary | ICD-10-CM | POA: Diagnosis not present

## 2018-11-18 DIAGNOSIS — Z136 Encounter for screening for cardiovascular disorders: Secondary | ICD-10-CM | POA: Diagnosis not present

## 2018-11-18 DIAGNOSIS — E785 Hyperlipidemia, unspecified: Secondary | ICD-10-CM | POA: Diagnosis not present

## 2018-11-18 DIAGNOSIS — Z1211 Encounter for screening for malignant neoplasm of colon: Secondary | ICD-10-CM | POA: Diagnosis not present

## 2018-11-18 DIAGNOSIS — Z1231 Encounter for screening mammogram for malignant neoplasm of breast: Secondary | ICD-10-CM | POA: Diagnosis not present

## 2018-11-18 DIAGNOSIS — Z0000 Encounter for general adult medical examination without abnormal findings: Secondary | ICD-10-CM | POA: Diagnosis not present

## 2018-11-24 ENCOUNTER — Ambulatory Visit (INDEPENDENT_AMBULATORY_CARE_PROVIDER_SITE_OTHER): Payer: Medicare Other | Admitting: Cardiology

## 2018-11-24 ENCOUNTER — Encounter: Payer: Self-pay | Admitting: Cardiology

## 2018-11-24 VITALS — BP 126/76 | HR 84 | Ht 70.0 in | Wt 307.0 lb

## 2018-11-24 DIAGNOSIS — Z79899 Other long term (current) drug therapy: Secondary | ICD-10-CM | POA: Diagnosis not present

## 2018-11-24 DIAGNOSIS — I483 Typical atrial flutter: Secondary | ICD-10-CM | POA: Diagnosis not present

## 2018-11-24 DIAGNOSIS — I4901 Ventricular fibrillation: Secondary | ICD-10-CM | POA: Diagnosis not present

## 2018-11-24 MED ORDER — SACUBITRIL-VALSARTAN 49-51 MG PO TABS: 1.0000 | ORAL_TABLET | Freq: Two times a day (BID) | ORAL | 3 refills | Status: DC

## 2018-11-24 NOTE — Progress Notes (Signed)
Electrophysiology Office Note   Date:  11/24/2018   ID:  Shannon Baxter, DOB 03/28/1961, MRN [id number]  PCP:  Paulina FusiSchultz, Douglas E, MD  Cardiologist:  Bing MatterKrasowski Primary Electrophysiologist:  Sydna Brodowski Jorja LoaMartin Jihaad Bruschi, MD    No chief complaint on file.    History of Present Illness: Shannon Baxter is a 58 y.o. female who is being seen today for the evaluation of atrial fibrillation, CHF at the request of Gypsy BalsamRobert Krasowski. Presenting today for electrophysiology evaluation.  Has a history of cardiomyopathy with an ejection fraction of 25 to 30% status post aortic valve replacement with mechanical valve, paroxysmal atrial flutter, type 2 diabetes, morbid obesity, hypertension, hyperlipidemia.  She was put on amiodarone previously and converted to sinus rhythm.  She had a ablation for atrial flutter on 11/06/2018.  On 11/10/2018, she went into ventricular fibrillation and got a shock from her ICD.  She was asleep at the time and was unaware.  She woke with a mouthful of blood.  Today, denies symptoms of palpitations, chest pain, shortness of breath, orthopnea, PND, lower extremity edema, claudication, dizziness, presyncope, syncope, bleeding, or neurologic sequela. The patient is tolerating medications without difficulties.     Past Medical History:  Diagnosis Date  . Allergy   . Aortic insufficiency   . Aortic stenosis   . Arthritis   . Asthma   . CHF (congestive heart failure) (HCC)   . Claudication (HCC)   . Coronary artery disease   . Diabetes mellitus without complication (HCC)   . GERD (gastroesophageal reflux disease)   . Heart murmur   . Hyperlipidemia   . Hypertension   . Nutritional and metabolic cardiomyopathy (HCC)   . PVD (peripheral vascular disease) (HCC)   . Sleep apnea   . Stroke [facility](HCC)    Past Surgical History:  Procedure Laterality Date  . A-FLUTTER ABLATION N/A 11/05/2018   Procedure: A-FLUTTER ABLATION;  Surgeon: Regan Lemmingamnitz, Locklyn Henriquez Martin, MD;  Location: [facility] INVASIVE CV LAB;   Service: Cardiovascular;  Laterality: N/A;  . CARDIAC CATHETERIZATION    . INSERT / REPLACE / REMOVE PACEMAKER     Medtronic ICD  . MECHANICAL AORTIC VALVE REPLACEMENT    . PERIPHERAL ARTERIAL STENT GRAFT    . TUBAL LIGATION       Current Outpatient Medications  Medication Sig Dispense Refill  . albuterol (PROVENTIL HFA;VENTOLIN HFA) 108 (90 Base) MCG/ACT inhaler Inhale 1-2 puffs into the lungs every 6 (six) hours as needed for wheezing or shortness of breath.    . ALPRAZolam (XANAX) 0.5 MG tablet Take 0.5 mg by mouth 3 (three) times daily as needed for anxiety.   0  . amiodarone (PACERONE) 200 MG tablet Take 200 mg by mouth daily after breakfast.   0  . carvedilol (COREG) 25 MG tablet Take 1 tablet (25 mg total) by mouth 2 (two) times daily. 180 tablet 1  . cephALEXin (KEFLEX) 500 MG capsule Take 1 capsule (500 mg total) by mouth 3 (three) times daily. 21 capsule 0  . clopidogrel (PLAVIX) 75 MG tablet Take 75 mg by mouth daily after breakfast.   6  . Cyanocobalamin (B-12) 500 MCG TABS Take 3 tablets by mouth daily.    Marland Kitchen. EPIPEN 2-PAK 0.3 MG/0.3ML SOAJ injection Inject 0.3 mg into the muscle once.   0  . ezetimibe (ZETIA) 10 MG tablet Take 10 mg by mouth daily.    . fluticasone (FLONASE) 50 MCG/ACT nasal spray Place 2 sprays into both nostrils daily as needed for allergies.     .Marland Kitchen  fluticasone furoate-vilanterol (BREO ELLIPTA) 200-25 MCG/INH AEPB Inhale 1 puff into the lungs daily.    . furosemide (LASIX) 20 MG tablet Take 60 mg in the morning and 60 mg in th evening. (Patient taking differently: Take 60 mg by mouth 2 (two) times daily. ) 180 tablet 1  . gabapentin (NEURONTIN) 300 MG capsule Take 300 mg by mouth 3 (three) times daily.   5  . glimepiride (AMARYL) 4 MG tablet Take 4 mg by mouth daily with breakfast.     . ipratropium-albuterol (DUONEB) 0.5-2.5 (3) MG/3ML SOLN Take 3 mLs by nebulization 2 (two) times daily.     Marland Kitchen levothyroxine (SYNTHROID, LEVOTHROID) 50 MCG tablet Take 50 mcg by  mouth daily before breakfast.    . linaclotide (LINZESS) 72 MCG capsule Take 72 mcg by mouth daily as needed (constipation).    Marland Kitchen loratadine (CLARITIN) 10 MG tablet Take 10 mg by mouth at bedtime.     . metFORMIN (GLUCOPHAGE) 500 MG tablet Take 500 mg by mouth 2 (two) times daily with a meal.     . metoprolol (TOPROL-XL) 200 MG 24 hr tablet Take 200 mg by mouth daily after breakfast.     . montelukast (SINGULAIR) 10 MG tablet Take 10 mg by mouth at bedtime.     . Multiple Vitamins-Minerals (CENTRUM SILVER ULTRA WOMENS) TABS Take 1 tablet by mouth daily.    Marland Kitchen neomycin-polymyxin-hydrocortisone (CORTISPORIN) OTIC solution Apply twice daily to left 1st toenail 10 mL 0  . omeprazole (PRILOSEC) 20 MG capsule Take 20 mg by mouth at bedtime.     . potassium chloride 20 MEQ TBCR Take 20 mEq by mouth daily. 90 tablet 1  . Red Yeast Rice 600 MG TABS Take 2 tablets by mouth daily.    Marland Kitchen warfarin (COUMADIN) 1 MG tablet Take 1 mg by mouth See admin instructions. Take 6 mg on Sunday, Tuesday, and  Thursday . Take 5 mg on all other days    . warfarin (COUMADIN) 5 MG tablet Take 5 mg by mouth See admin instructions. Take 6 mg on Sunday, Tuesday, and  Thursday . Take 5 mg on all other days    . sacubitril-valsartan (ENTRESTO) 49-51 MG Take 1 tablet by mouth 2 (two) times daily. 60 tablet 3   No current facility-administered medications for this visit.     Allergies:   Iodinated diagnostic agents and Ioxaglate   Social History:  The patient  reports that she has quit smoking. She has never used smokeless tobacco. She reports that she does not drink alcohol or use drugs.   Family History:  The patient's family history includes Heart disease in her brother.   ROS:  Please see the history of present illness.   Otherwise, review of systems is positive for none.   All other systems are reviewed and negative.   PHYSICAL EXAM: VS:  BP 126/76   Pulse 84   Ht 5\' 10"  (1.778 m)   Wt (!) 307 lb (139.3 kg)   BMI 44.05  kg/m  , BMI Body mass index is 44.05 kg/m. GEN: Well nourished, well developed, in no acute distress  HEENT: normal  Neck: no JVD, carotid bruits, or masses Cardiac: RRR; no murmurs, rubs, or gallops,no edema  Respiratory:  clear to auscultation bilaterally, normal work of breathing GI: soft, nontender, nondistended, + BS MS: no deformity or atrophy  Skin: warm and dry, device site well healed Neuro:  Strength and sensation are intact Psych: euthymic mood, full affect  EKG:  EKG is ordered today. Personal review of the ekg ordered shows SR, 1dAVB,   Personal review of the device interrogation today. Results in [facility]   Recent Labs: 10/16/2018: NT-Pro BNP 1,280 10/27/2018: BUN 16; Creatinine, Ser 1.06; Potassium 3.8; Sodium 142 11/05/2018: Hemoglobin 11.8; Platelets 241    Lipid Panel     Component Value Date/Time   CHOL  07/04/2007 0815    200        ATP III CLASSIFICATION:  <200     mg/dL   Desirable  299-242  mg/dL   Borderline High  >=683    mg/dL   High   TRIG 419 62/22/9798 0815   HDL 47 07/04/2007 0815   CHOLHDL 4.3 07/04/2007 0815   VLDL 28 07/04/2007 0815   LDLCALC (H) 07/04/2007 0815    125        Total Cholesterol/HDL:CHD Risk Coronary Heart Disease Risk Table                     Men   Women  1/2 Average Risk   3.4   3.3     Wt Readings from Last 3 Encounters:  11/24/18 (!) 307 lb (139.3 kg)  11/05/18 (!) 328 lb 7.8 oz (149 kg)  10/30/18 (!) 310 lb 9.6 oz (140.9 kg)      Other studies Reviewed: Additional studies/ records that were reviewed today include: TTE 11/21/17  Review of the above records today demonstrates:  Mild concentric LVH, EF 30% Normal left atrial size mild to moderate mitral regurgitation Mild to moderate aortic stenosis    ASSESSMENT AND PLAN:  1.  Typical atrial flutter: Status post ablation 11/06/2018.  No further episodes of atrial flutter noted.  Chaselyn Baxter continue anticoagulation for 1 month.  This patients CHA2DS2-VASc Score  and unadjusted Ischemic Stroke Rate (% per year) is equal to 2.2 % stroke rate/year from a score of 2  Above score calculated as 1 point each if present [CHF, HTN, DM, Vascular=MI/PAD/Aortic Plaque, Age if 65-74, or Female] Above score calculated as 2 points each if present [Age > 75, or Stroke/TIA/TE]  2.  Nonischemic cardiomyopathy: Status post Medtronic ICD.  Functioning appropriately.  No changes.  Currently on optimal medical therapy.  Due to her ICD shock, we Shannon Baxter increase Entresto.  Was yesterday symptomatic  3.  Hypertension: Well-controlled today.  Elden Brucato increase Entresto due to recent VF episode.  The last 3 months  4.  Aortic valve replacement: Plan per primary cardiology.  Functioning appropriately on most recent echo.  5.  Obstructive sleep apnea: CPAP compliance encouraged  6.  Ventricular fibrillation: Noted on device interrogation with an ICD shock 11/10/2018.  In review of device interrogation, it appears that her fibrillation was not related to her QT interval.  Her QTc is 484 ms today.  She did not have a PVC on a T wave.  She is on amiodarone.  We Shannon Baxter double that for 2 weeks to 400 mg a day.  We Shannon Baxter also plan to check a basic metabolic, magnesium, and BNP.  I have told her no driving per [facility] law for 6 months due to ICD therapy.  Current medicines are reviewed at length with the patient today.   The patient does not have concerns regarding her medicines.  The following changes were made today: Increase Entresto, increase amiodarone  Labs/ tests ordered today include:  Orders Placed This Encounter  Procedures  . Basic metabolic panel  . Magnesium  .  Pro b natriuretic peptide (BNP)  . EKG 12-Lead   Case discussed with primary cardiology  Disposition:   FU with Bond Grieshop 3 months  Signed, Nathanyl Andujo Jorja Loa, MD  11/24/2018 1:41 PM     [facility] [address] [place] [address] [phone] (office) [phone]  (fax)

## 2018-11-24 NOTE — Patient Instructions (Addendum)
Medication Instructions:  Your physician has recommended you make the following change in your medication:  1. INCREASE Entresto to 49/51 mg twice a day 2. INCREASE Amiodarone to 200 mg twice a day for 2 weeks, then reduce to normal dosing of 200 mg once daily. 3. STOP Hydralazine  *If you need a refill on your cardiac medications before your next appointment, please call your pharmacy*  Labwork: Today: BMET, BNP & Magnesium level  Testing/Procedures: None ordered  Follow-Up: Remote monitoring is used to monitor your Pacemaker or ICD from home. This monitoring reduces the number of office visits required to check your device to one time per year. It allows [place] to keep an eye on the functioning of your device to ensure it is working properly. You are scheduled for a device check from home on 03/02/2019. You may send your transmission at any time that day. If you have a wireless device, the transmission will be sent automatically. After your physician reviews your transmission, you will receive a postcard with your next transmission date.  Your physician recommends that you schedule a follow-up appointment in: 3 months with Dr. Elberta Fortis.  Thank you for choosing [facility]!!   Dory Horn, RN [phone]

## 2018-11-25 DIAGNOSIS — R791 Abnormal coagulation profile: Secondary | ICD-10-CM | POA: Diagnosis not present

## 2018-11-25 LAB — BASIC METABOLIC PANEL
BUN/Creatinine Ratio: 19 (ref 9–23)
BUN: 19 mg/dL (ref 6–24)
CHLORIDE: 101 mmol/L (ref 96–106)
CO2: 23 mmol/L (ref 20–29)
Calcium: 9.4 mg/dL (ref 8.7–10.2)
Creatinine, Ser: 0.99 mg/dL (ref 0.57–1.00)
GFR calc Af Amer: 74 mL/min/{1.73_m2} (ref >59)
GFR calc non Af Amer: 64 mL/min/{1.73_m2} (ref >59)
GLUCOSE: 122 mg/dL — AB (ref 65–99)
POTASSIUM: 4 mmol/L (ref 3.5–5.2)
Sodium: 141 mmol/L (ref 134–144)

## 2018-11-25 LAB — MAGNESIUM: MAGNESIUM: 1.7 mg/dL (ref 1.6–2.3)

## 2018-11-25 LAB — PRO B NATRIURETIC PEPTIDE: NT-Pro BNP: 668 pg/mL — ABNORMAL HIGH (ref 0–287)

## 2018-11-27 ENCOUNTER — Other Ambulatory Visit: Payer: Self-pay | Admitting: Cardiology

## 2018-12-01 ENCOUNTER — Ambulatory Visit: Payer: Medicare Other | Admitting: Cardiology

## 2018-12-02 DIAGNOSIS — R791 Abnormal coagulation profile: Secondary | ICD-10-CM | POA: Diagnosis not present

## 2018-12-02 DIAGNOSIS — M1712 Unilateral primary osteoarthritis, left knee: Secondary | ICD-10-CM | POA: Diagnosis not present

## 2018-12-02 DIAGNOSIS — G4733 Obstructive sleep apnea (adult) (pediatric): Secondary | ICD-10-CM | POA: Diagnosis not present

## 2018-12-09 DIAGNOSIS — R791 Abnormal coagulation profile: Secondary | ICD-10-CM | POA: Diagnosis not present

## 2018-12-11 DIAGNOSIS — R791 Abnormal coagulation profile: Secondary | ICD-10-CM | POA: Diagnosis not present

## 2018-12-16 DIAGNOSIS — M1711 Unilateral primary osteoarthritis, right knee: Secondary | ICD-10-CM | POA: Diagnosis not present

## 2018-12-18 DIAGNOSIS — R791 Abnormal coagulation profile: Secondary | ICD-10-CM | POA: Diagnosis not present

## 2018-12-25 DIAGNOSIS — R791 Abnormal coagulation profile: Secondary | ICD-10-CM | POA: Diagnosis not present

## 2018-12-29 ENCOUNTER — Encounter: Payer: Self-pay | Admitting: Cardiology

## 2018-12-29 ENCOUNTER — Ambulatory Visit (INDEPENDENT_AMBULATORY_CARE_PROVIDER_SITE_OTHER): Payer: Medicare Other | Admitting: Cardiology

## 2018-12-29 VITALS — BP 110/68 | HR 70 | Ht 70.0 in | Wt 304.8 lb

## 2018-12-29 DIAGNOSIS — Z95810 Presence of automatic (implantable) cardiac defibrillator: Secondary | ICD-10-CM | POA: Diagnosis not present

## 2018-12-29 DIAGNOSIS — I4892 Unspecified atrial flutter: Secondary | ICD-10-CM | POA: Diagnosis not present

## 2018-12-29 DIAGNOSIS — E785 Hyperlipidemia, unspecified: Secondary | ICD-10-CM

## 2018-12-29 DIAGNOSIS — I5022 Chronic systolic (congestive) heart failure: Secondary | ICD-10-CM | POA: Diagnosis not present

## 2018-12-29 DIAGNOSIS — I2510 Atherosclerotic heart disease of native coronary artery without angina pectoris: Secondary | ICD-10-CM

## 2018-12-29 NOTE — Patient Instructions (Signed)
Medication Instructions:  Your physician recommends that you continue on your current medications as directed. Please refer to the Current Medication list given to you today.  If you need a refill on your cardiac medications before your next appointment, please call your pharmacy.   Lab work: Your physician recommends that you return for lab work in: bmp, magnesium   If you have labs (blood work) drawn today and your tests are completely normal, you will receive your results only by: Marland Kitchen MyChart Message (if you have MyChart) OR . A paper copy in the mail If you have any lab test that is abnormal or we need to change your treatment, we will call you to review the results.  Testing/Procedures: None.   Follow-Up: At [facility], you and your health needs are our priority.  As part of our continuing mission to provide you with exceptional heart care, we have created designated Provider Care Teams.  These Care Teams include your primary Cardiologist (physician) and Advanced Practice Providers (APPs -  Physician Assistants and Nurse Practitioners) who all work together to provide you with the care you need, when you need it. You will need a follow up appointment in 2 months.  Please call our office 2 months in advance to schedule this appointment.  You may see Gypsy Balsam, MD or another member of our [facility] Provider Team in [place]: Norman Herrlich, MD . Belva Crome, MD  Any Other Special Instructions Will Be Listed Below (If Applicable).

## 2018-12-29 NOTE — Progress Notes (Signed)
Cardiology Office Note:    Date:  12/29/2018   ID:  [name], DOB [birth year], MRN [id number]  PCP:  Paulina Fusi, MD  Cardiologist:  Gypsy Balsam, MD    Referring MD: Paulina Fusi, MD   Chief Complaint  Patient presents with  . 2 month follow up  doing fine    History of Present Illness:    Shannon Baxter is a 58 y.o. female with cardiomyopathy overall seems to be doing well denies have any chest pain tightness squeezing pressure burning chest somewhat fatigued but overall seems to be improving denies having any palpitations did go to atrial flutter ablation with no difficulties  Past Medical History:  Diagnosis Date  . Allergy   . Aortic insufficiency   . Aortic stenosis   . Arthritis   . Asthma   . CHF (congestive heart failure) (HCC)   . Claudication (HCC)   . Coronary artery disease   . Diabetes mellitus without complication (HCC)   . GERD (gastroesophageal reflux disease)   . Heart murmur   . Hyperlipidemia   . Hypertension   . Nutritional and metabolic cardiomyopathy (HCC)   . PVD (peripheral vascular disease) (HCC)   . Sleep apnea   . Stroke [facility])     Past Surgical History:  Procedure Laterality Date  . A-FLUTTER ABLATION N/A 11/05/2018   Procedure: A-FLUTTER ABLATION;  Surgeon: Regan Lemming, MD;  Location: [facility] INVASIVE CV LAB;  Service: Cardiovascular;  Laterality: N/A;  . CARDIAC CATHETERIZATION    . INSERT / REPLACE / REMOVE PACEMAKER     Medtronic ICD  . MECHANICAL AORTIC VALVE REPLACEMENT    . PERIPHERAL ARTERIAL STENT GRAFT    . TUBAL LIGATION      Current Medications: Current Meds  Medication Sig  . albuterol (PROVENTIL HFA;VENTOLIN HFA) 108 (90 Base) MCG/ACT inhaler Inhale 1-2 puffs into the lungs every 6 (six) hours as needed for wheezing or shortness of breath.  . ALPRAZolam (XANAX) 0.5 MG tablet Take 0.5 mg by mouth 3 (three) times daily as needed for anxiety.   Marland Kitchen amiodarone (PACERONE) 200 MG tablet Take 200 mg by mouth  daily after breakfast.   . carvedilol (COREG) 25 MG tablet Take 1 tablet (25 mg total) by mouth 2 (two) times daily.  . cephALEXin (KEFLEX) 500 MG capsule Take 1 capsule (500 mg total) by mouth 3 (three) times daily.  . clopidogrel (PLAVIX) 75 MG tablet Take 75 mg by mouth daily after breakfast.   . Cyanocobalamin (B-12) 500 MCG TABS Take 3 tablets by mouth daily.  Marland Kitchen EPIPEN 2-PAK 0.3 MG/0.3ML SOAJ injection Inject 0.3 mg into the muscle once.   . ezetimibe (ZETIA) 10 MG tablet Take 10 mg by mouth daily.  . fluticasone (FLONASE) 50 MCG/ACT nasal spray Place 2 sprays into both nostrils daily as needed for allergies.   . fluticasone furoate-vilanterol (BREO ELLIPTA) 200-25 MCG/INH AEPB Inhale 1 puff into the lungs daily.  . furosemide (LASIX) 20 MG tablet Take 60 mg in the morning and 60 mg in th evening. (Patient taking differently: Take 60 mg by mouth 2 (two) times daily. )  . gabapentin (NEURONTIN) 300 MG capsule Take 300 mg by mouth 3 (three) times daily.   Marland Kitchen glimepiride (AMARYL) 4 MG tablet Take 4 mg by mouth daily with breakfast.   . ipratropium-albuterol (DUONEB) 0.5-2.5 (3) MG/3ML SOLN Take 3 mLs by nebulization 2 (two) times daily.   Marland Kitchen levothyroxine (SYNTHROID, LEVOTHROID) 50 MCG tablet Take 50 mcg  by mouth daily before breakfast.  . linaclotide (LINZESS) 72 MCG capsule Take 72 mcg by mouth daily as needed (constipation).  Marland Kitchen loratadine (CLARITIN) 10 MG tablet Take 10 mg by mouth at bedtime.   . metFORMIN (GLUCOPHAGE) 500 MG tablet Take 500 mg by mouth 2 (two) times daily with a meal.   . metoprolol (TOPROL-XL) 200 MG 24 hr tablet Take 200 mg by mouth daily after breakfast.   . montelukast (SINGULAIR) 10 MG tablet Take 10 mg by mouth at bedtime.   . Multiple Vitamins-Minerals (CENTRUM SILVER ULTRA WOMENS) TABS Take 1 tablet by mouth daily.  Marland Kitchen neomycin-polymyxin-hydrocortisone (CORTISPORIN) OTIC solution Apply twice daily to left 1st toenail  . omeprazole (PRILOSEC) 20 MG capsule Take 20 mg  by mouth at bedtime.   . potassium chloride 20 MEQ TBCR Take 20 mEq by mouth daily.  . Red Yeast Rice 600 MG TABS Take 2 tablets by mouth daily.  . sacubitril-valsartan (ENTRESTO) 49-51 MG Take 1 tablet by mouth 2 (two) times daily.  Marland Kitchen warfarin (COUMADIN) 1 MG tablet Take 1 mg by mouth See admin instructions. Take 6 mg on Sunday, Tuesday, and  Thursday . Take 5 mg on all other days  . warfarin (COUMADIN) 5 MG tablet Take 5 mg by mouth See admin instructions. Take 6 mg on Sunday, Tuesday, and  Thursday . Take 5 mg on all other days     Allergies:   Iodinated diagnostic agents and Ioxaglate   Social History   Socioeconomic History  . Marital status: Single    Spouse name: Not on file  . Number of children: Not on file  . Years of education: Not on file  . Highest education level: Not on file  Occupational History  . Not on file  Social Needs  . Financial resource strain: Not on file  . Food insecurity:    Worry: Not on file    Inability: Not on file  . Transportation needs:    Medical: Not on file    Non-medical: Not on file  Tobacco Use  . Smoking status: Former Games developer  . Smokeless tobacco: Never Used  Substance and Sexual Activity  . Alcohol use: No  . Drug use: No  . Sexual activity: Not on file  Lifestyle  . Physical activity:    Days per week: Not on file    Minutes per session: Not on file  . Stress: Not on file  Relationships  . Social connections:    Talks on phone: Not on file    Gets together: Not on file    Attends religious service: Not on file    Active member of club or organization: Not on file    Attends meetings of clubs or organizations: Not on file    Relationship status: Not on file  Other Topics Concern  . Not on file  Social History Narrative  . Not on file     Family History: The patient's family history includes Heart disease in her brother. ROS:   Please see the history of present illness.    All 14 point review of systems negative  except as described per history of present illness  EKGs/Labs/Other Studies Reviewed:      Recent Labs: 11/05/2018: Hemoglobin 11.8; Platelets 241 11/24/2018: BUN 19; Creatinine, Ser 0.99; Magnesium 1.7; NT-Pro BNP 668; Potassium 4.0; Sodium 141  Recent Lipid Panel    Component Value Date/Time   CHOL  07/04/2007 0815    200  ATP III CLASSIFICATION:  <200     mg/dL   Desirable  683-729  mg/dL   Borderline High  >=021    mg/dL   High   TRIG 115 52/05/222 0815   HDL 47 07/04/2007 0815   CHOLHDL 4.3 07/04/2007 0815   VLDL 28 07/04/2007 0815   LDLCALC (H) 07/04/2007 0815    125        Total Cholesterol/HDL:CHD Risk Coronary Heart Disease Risk Table                     Men   Women  1/2 Average Risk   3.4   3.3    Physical Exam:    VS:  BP 110/68   Pulse 70   Ht 5\' 10"  (1.778 m)   Wt (!) 304 lb 12.8 oz (138.3 kg)   SpO2 96%   BMI 43.73 kg/m     Wt Readings from Last 3 Encounters:  12/29/18 (!) 304 lb 12.8 oz (138.3 kg)  11/24/18 (!) 307 lb (139.3 kg)  11/05/18 (!) 328 lb 7.8 oz (149 kg)     GEN:  Well nourished, well developed in no acute distress HEENT: Normal NECK: No JVD; No carotid bruits LYMPHATICS: No lymphadenopathy CARDIAC: RRR, no murmurs, no rubs, no gallops RESPIRATORY:  Clear to auscultation without rales, wheezing or rhonchi  ABDOMEN: Soft, non-tender, non-distended MUSCULOSKELETAL:  No edema; No deformity  SKIN: Warm and dry LOWER EXTREMITIES: no swelling NEUROLOGIC:  Alert and oriented x 3 PSYCHIATRIC:  Normal affect   ASSESSMENT:    1. Paroxysmal atrial flutter (HCC)   2. Coronary artery disease involving native coronary artery of native heart without angina pectoris   3. Chronic systolic congestive heart failure (HCC)   4. Presence of automatic implantable cardioverter-defibrillator   5. Dyslipidemia    PLAN:    In order of problems listed above:  Paroxysmal atrial flutter.  Status post atrial fibrillation ablation doing well from  that point review Artery disease stable on appropriate medications which I will continue Chronic systolic congestive heart for appears to be compensated continue present management. ICD present followed by EP team  Medication Adjustments/Labs and Tests Ordered: Current medicines are reviewed at length with the patient today.  Concerns regarding medicines are outlined above.  No orders of the defined types were placed in this encounter.  Medication changes: No orders of the defined types were placed in this encounter.   Signed, Georgeanna Lea, MD, [facility] 12/29/2018 12:16 PM    [facility]

## 2018-12-30 DIAGNOSIS — R791 Abnormal coagulation profile: Secondary | ICD-10-CM | POA: Diagnosis not present

## 2018-12-30 LAB — MAGNESIUM: Magnesium: 2.1 mg/dL (ref 1.6–2.3)

## 2018-12-30 LAB — BASIC METABOLIC PANEL
BUN/Creatinine Ratio: 31 — ABNORMAL HIGH (ref 9–23)
BUN: 43 mg/dL — ABNORMAL HIGH (ref 6–24)
CO2: 21 mmol/L (ref 20–29)
Calcium: 8.9 mg/dL (ref 8.7–10.2)
Chloride: 101 mmol/L (ref 96–106)
Creatinine, Ser: 1.4 mg/dL — ABNORMAL HIGH (ref 0.57–1.00)
GFR calc Af Amer: 48 mL/min/{1.73_m2} — ABNORMAL LOW (ref >59)
GFR calc non Af Amer: 42 mL/min/{1.73_m2} — ABNORMAL LOW (ref >59)
Glucose: 92 mg/dL (ref 65–99)
Potassium: 4.2 mmol/L (ref 3.5–5.2)
Sodium: 141 mmol/L (ref 134–144)

## 2018-12-31 DIAGNOSIS — G4733 Obstructive sleep apnea (adult) (pediatric): Secondary | ICD-10-CM | POA: Diagnosis not present

## 2019-01-05 ENCOUNTER — Ambulatory Visit: Payer: Medicare Other | Admitting: Cardiology

## 2019-01-05 ENCOUNTER — Telehealth: Payer: Self-pay

## 2019-01-05 DIAGNOSIS — I2510 Atherosclerotic heart disease of native coronary artery without angina pectoris: Secondary | ICD-10-CM | POA: Diagnosis not present

## 2019-01-05 DIAGNOSIS — I10 Essential (primary) hypertension: Secondary | ICD-10-CM | POA: Diagnosis not present

## 2019-01-05 DIAGNOSIS — E1140 Type 2 diabetes mellitus with diabetic neuropathy, unspecified: Secondary | ICD-10-CM | POA: Diagnosis not present

## 2019-01-05 DIAGNOSIS — R791 Abnormal coagulation profile: Secondary | ICD-10-CM | POA: Diagnosis not present

## 2019-01-05 DIAGNOSIS — E785 Hyperlipidemia, unspecified: Secondary | ICD-10-CM | POA: Diagnosis not present

## 2019-01-05 DIAGNOSIS — Z79899 Other long term (current) drug therapy: Secondary | ICD-10-CM | POA: Diagnosis not present

## 2019-01-05 DIAGNOSIS — E039 Hypothyroidism, unspecified: Secondary | ICD-10-CM | POA: Diagnosis not present

## 2019-01-05 NOTE — Telephone Encounter (Signed)
LM that results were good and to call back with questions if needed.

## 2019-01-06 ENCOUNTER — Other Ambulatory Visit: Payer: Self-pay

## 2019-01-06 MED ORDER — FUROSEMIDE 20 MG PO TABS: 60.0000 mg | ORAL_TABLET | Freq: Two times a day (BID) | ORAL | 1 refills | Status: DC

## 2019-01-07 ENCOUNTER — Ambulatory Visit: Payer: Medicare Other | Admitting: Sports Medicine

## 2019-01-07 DIAGNOSIS — R791 Abnormal coagulation profile: Secondary | ICD-10-CM | POA: Diagnosis not present

## 2019-01-09 DIAGNOSIS — R791 Abnormal coagulation profile: Secondary | ICD-10-CM | POA: Diagnosis not present

## 2019-01-09 DIAGNOSIS — G4733 Obstructive sleep apnea (adult) (pediatric): Secondary | ICD-10-CM | POA: Diagnosis not present

## 2019-01-12 DIAGNOSIS — R791 Abnormal coagulation profile: Secondary | ICD-10-CM | POA: Diagnosis not present

## 2019-01-16 DIAGNOSIS — R791 Abnormal coagulation profile: Secondary | ICD-10-CM | POA: Diagnosis not present

## 2019-01-19 DIAGNOSIS — R791 Abnormal coagulation profile: Secondary | ICD-10-CM | POA: Diagnosis not present

## 2019-01-20 DIAGNOSIS — G4733 Obstructive sleep apnea (adult) (pediatric): Secondary | ICD-10-CM | POA: Diagnosis not present

## 2019-01-21 DIAGNOSIS — I5022 Chronic systolic (congestive) heart failure: Secondary | ICD-10-CM | POA: Diagnosis not present

## 2019-01-21 DIAGNOSIS — I060 Rheumatic aortic stenosis: Secondary | ICD-10-CM | POA: Diagnosis not present

## 2019-01-21 DIAGNOSIS — I110 Hypertensive heart disease with heart failure: Secondary | ICD-10-CM | POA: Diagnosis not present

## 2019-01-21 DIAGNOSIS — Z87891 Personal history of nicotine dependence: Secondary | ICD-10-CM | POA: Diagnosis not present

## 2019-01-21 DIAGNOSIS — I70213 Atherosclerosis of native arteries of extremities with intermittent claudication, bilateral legs: Secondary | ICD-10-CM | POA: Diagnosis not present

## 2019-01-22 ENCOUNTER — Ambulatory Visit: Payer: Medicare Other | Admitting: Sports Medicine

## 2019-01-22 DIAGNOSIS — G4733 Obstructive sleep apnea (adult) (pediatric): Secondary | ICD-10-CM | POA: Diagnosis not present

## 2019-01-23 DIAGNOSIS — R791 Abnormal coagulation profile: Secondary | ICD-10-CM | POA: Diagnosis not present

## 2019-01-26 DIAGNOSIS — R791 Abnormal coagulation profile: Secondary | ICD-10-CM | POA: Diagnosis not present

## 2019-02-02 DIAGNOSIS — R05 Cough: Secondary | ICD-10-CM | POA: Diagnosis not present

## 2019-02-02 DIAGNOSIS — G4733 Obstructive sleep apnea (adult) (pediatric): Secondary | ICD-10-CM | POA: Diagnosis not present

## 2019-02-02 DIAGNOSIS — R791 Abnormal coagulation profile: Secondary | ICD-10-CM | POA: Diagnosis not present

## 2019-02-02 DIAGNOSIS — R0600 Dyspnea, unspecified: Secondary | ICD-10-CM | POA: Diagnosis not present

## 2019-02-02 DIAGNOSIS — J4531 Mild persistent asthma with (acute) exacerbation: Secondary | ICD-10-CM | POA: Diagnosis not present

## 2019-02-02 DIAGNOSIS — J301 Allergic rhinitis due to pollen: Secondary | ICD-10-CM | POA: Diagnosis not present

## 2019-02-06 DIAGNOSIS — R791 Abnormal coagulation profile: Secondary | ICD-10-CM | POA: Diagnosis not present

## 2019-02-09 DIAGNOSIS — G4733 Obstructive sleep apnea (adult) (pediatric): Secondary | ICD-10-CM | POA: Diagnosis not present

## 2019-02-09 DIAGNOSIS — J301 Allergic rhinitis due to pollen: Secondary | ICD-10-CM | POA: Diagnosis not present

## 2019-02-09 DIAGNOSIS — J4530 Mild persistent asthma, uncomplicated: Secondary | ICD-10-CM | POA: Diagnosis not present

## 2019-02-13 DIAGNOSIS — R791 Abnormal coagulation profile: Secondary | ICD-10-CM | POA: Diagnosis not present

## 2019-02-16 ENCOUNTER — Telehealth (INDEPENDENT_AMBULATORY_CARE_PROVIDER_SITE_OTHER): Payer: Self-pay | Admitting: *Deleted

## 2019-02-16 ENCOUNTER — Telehealth: Payer: Self-pay | Admitting: *Deleted

## 2019-02-16 NOTE — Telephone Encounter (Signed)
Virtual Visit Pre-Appointment Phone Call  "(Name), I am calling you today to discuss your upcoming appointment. We are currently trying to limit exposure to the virus that causes COVID-19 by seeing patients at home rather than in the office."  1. "What is the BEST phone number to call the day of the visit?" - include this in appointment notes  2. "Do you have or have access to (through a family member/friend) a smartphone with video capability that we can use for your visit?" a. If yes - list this number in appt notes as "cell" (if different from BEST phone #) and list the appointment type as a VIDEO visit in appointment notes b. If no - list the appointment type as a PHONE visit in appointment notes  3. Confirm consent - "In the setting of the current Covid19 crisis, you are scheduled for a (phone or video) visit with your provider on (date) at (time).  Just as we do with many in-office visits, in order for you to participate in this visit, we must obtain consent.  If you'd like, I can send this to your mychart (if signed up) or email for you to review.  Otherwise, I can obtain your verbal consent now.  All virtual visits are billed to your insurance company just like a normal visit would be.  By agreeing to a virtual visit, we'd like you to understand that the technology does not allow for your provider to perform an examination, and thus may limit your provider's ability to fully assess your condition. If your provider identifies any concerns that need to be evaluated in person, we will make arrangements to do so.  Finally, though the technology is pretty good, we cannot assure that it will always work on either your or our end, and in the setting of a video visit, we may have to convert it to a phone-only visit.  In either situation, we cannot ensure that we have a secure connection.  Are you willing to proceed?" STAFF: Did the patient verbally acknowledge consent to telehealth visit? Document  YES/NO here: YES  4. Advise patient to be prepared - "Two hours prior to your appointment, go ahead and check your blood pressure, pulse, oxygen saturation, and your weight (if you have the equipment to check those) and write them all down. When your visit starts, your provider will ask you for this information. If you have an Apple Watch or Kardia device, please plan to have heart rate information ready on the day of your appointment. Please have a pen and paper handy nearby the day of the visit as well."  5. Give patient instructions for MyChart download to smartphone OR Doximity/Doxy.me as below if video visit (depending on what platform provider is using)  6. Inform patient they will receive a phone call 15 minutes prior to their appointment time (may be from unknown caller ID) so they should be prepared to answer    TELEPHONE CALL NOTE  Shannon Baxter has been deemed a candidate for a follow-up tele-health visit to limit community exposure during the Covid-19 pandemic. I spoke with the patient via phone to ensure availability of phone/video source, confirm preferred email & phone number, and discuss instructions and expectations.  I reminded Shannon Baxter to be prepared with any vital sign and/or heart rhythm information that could potentially be obtained via home monitoring, at the time of her visit. I reminded Shannon Baxter to expect a phone call prior to her visit.  Shannon Baxter 02/16/2019 12:14 PM   INSTRUCTIONS FOR DOWNLOADING THE MYCHART APP TO SMARTPHONE  - The patient must first make sure to have activated MyChart and know their login information - If Apple, go to [employer] and type in MyChart in the search bar and download the app. If Android, ask patient to go to [employer] and type in [place] in the search bar and download the app. The app is free but as with any other app downloads, their phone may require them to verify saved payment information or Apple/Android password.  -  The patient will need to then log into the app with their MyChart username and password, and select [place] as their healthcare provider to link the account. When it is time for your visit, go to the MyChart app, find appointments, and click Begin Video Visit. Be sure to Select Allow for your device to access the Microphone and Camera for your visit. You will then be connected, and your provider will be with you shortly.  **If they have any issues connecting, or need assistance please contact MyChart service desk [phone])**  **If using a computer, in order to ensure the best quality for their visit they will need to use either of the following Internet Browsers: [employer] or Google Chrome**  IF USING DOXIMITY or DOXY.ME - The patient will receive a link just prior to their visit by text.     FULL LENGTH CONSENT FOR TELE-HEALTH VISIT   I hereby voluntarily request, consent and authorize [facility] and its employed or contracted physicians, physician assistants, nurse practitioners or other licensed health care professionals (the Practitioner), to provide me with telemedicine health care services (the "Services") as deemed necessary by the treating Practitioner. I acknowledge and consent to receive the Services by the Practitioner via telemedicine. I understand that the telemedicine visit will involve communicating with the Practitioner through live audiovisual communication technology and the disclosure of certain medical information by electronic transmission. I acknowledge that I have been given the opportunity to request an in-person assessment or other available alternative prior to the telemedicine visit and am voluntarily participating in the telemedicine visit.  I understand that I have the right to withhold or withdraw my consent to the use of telemedicine in the course of my care at any time, without affecting my right to future care or treatment, and that the  Practitioner or I may terminate the telemedicine visit at any time. I understand that I have the right to inspect all information obtained and/or recorded in the course of the telemedicine visit and may receive copies of available information for a reasonable fee.  I understand that some of the potential risks of receiving the Services via telemedicine include:  Marland Kitchen Delay or interruption in medical evaluation due to technological equipment failure or disruption; . Information transmitted may not be sufficient (e.g. poor resolution of images) to allow for appropriate medical decision making by the Practitioner; and/or  . In rare instances, security protocols could fail, causing a breach of personal health information.  Furthermore, I acknowledge that it is my responsibility to provide information about my medical history, conditions and care that is complete and accurate to the best of my ability. I acknowledge that Practitioner's advice, recommendations, and/or decision may be based on factors not within their control, such as incomplete or inaccurate data provided by me or distortions of diagnostic images or specimens that may result from electronic transmissions. I understand that the practice  of medicine is not an Chief Strategy Officer and that Practitioner makes no warranties or guarantees regarding treatment outcomes. I acknowledge that I will receive a copy of this consent concurrently upon execution via email to the email address I last provided but may also request a printed copy by calling the office of [place].    I understand that my insurance will be billed for this visit.   I have read or had this consent read to me. . I understand the contents of this consent, which adequately explains the benefits and risks of the Services being provided via telemedicine.  . I have been provided ample opportunity to ask questions regarding this consent and the Services and have had my questions answered to my  satisfaction. . I give my informed consent for the services to be provided through the use of telemedicine in my medical care  By participating in this telemedicine visit I agree to the above.

## 2019-02-16 NOTE — Telephone Encounter (Signed)
Calling patient today to discuss  upcoming appointment.  We are currently trying to limit exposure to the virus that causes COVID-19 by seeing patients at home rather than in the office. We would like to schedule this appointment as a Retail banker. Patient is aware if they decide to reschedule this appointment, they may not be seen or scheduled for the next 4-6 months. Patient at this time declines Virtual Visits. Patient does not have computer, internet, or smart phone access. Message sent scheduling and nurse.   Concerns and/or Complaints:                    Since your last visit or hospitalization:   1. Have you been having new or worsening chest pain or chest discomfort? No 2. Have you been having new or worsening shortness of breath? No 3. Have you been having new or worsening leg swelling, wt gain, or increase in abdominal girth (pants fitting more tightly)? No 4. Have you had any passing out spells, dizziness, or uncommon headaches? No 5. Have you had any extreme tiredness or fatigue? No 6. Have you had any problems with your device or device site (swelling, fever, tenderness, redness, vibrations, beeping)? N/A  Patient has a flip phone, has no complaints.

## 2019-02-16 NOTE — Progress Notes (Signed)
Opened error

## 2019-02-20 DIAGNOSIS — R791 Abnormal coagulation profile: Secondary | ICD-10-CM | POA: Diagnosis not present

## 2019-02-23 ENCOUNTER — Telehealth (INDEPENDENT_AMBULATORY_CARE_PROVIDER_SITE_OTHER): Payer: Medicare Other | Admitting: Cardiology

## 2019-02-23 ENCOUNTER — Other Ambulatory Visit: Payer: Self-pay

## 2019-02-23 ENCOUNTER — Encounter: Payer: Self-pay | Admitting: Cardiology

## 2019-02-23 ENCOUNTER — Telehealth: Payer: Self-pay | Admitting: Cardiology

## 2019-02-23 DIAGNOSIS — I5023 Acute on chronic systolic (congestive) heart failure: Secondary | ICD-10-CM | POA: Diagnosis not present

## 2019-02-23 NOTE — Progress Notes (Signed)
Electrophysiology TeleHealth Note   Due to national recommendations of social distancing due to COVID 19, an audio/video telehealth visit is felt to be most appropriate for this patient at this time.  See Epic message for the patient's consent to telehealth for [facility].   Date:  02/23/2019   ID:  Shannon Baxter Baxter, DOB 04/24/1961, MRN [id number]  Location: patient's home  Provider location: [address], [place]  Evaluation Performed: Follow-up visit  PCP:  Paulina FusiSchultz, Douglas E, MD  Cardiologist:  Gypsy Balsamobert Krasowski, MD  Electrophysiologist:  Dr Elberta Fortisamnitz  Chief Complaint:  CHF, atrial flutter  History of Present Illness:    Shannon Baxter is a 58 y.o. female who presents via audio/video conferencing for a telehealth visit today.  Since last being seen in our clinic, the patient reports doing very well.  Today, she denies symptoms of palpitations, chest pain, shortness of breath,  lower extremity edema, dizziness, presyncope, or syncope.  The patient is otherwise without complaint today.  The patient denies symptoms of fevers, chills, cough, or new SOB worrisome for COVID 19.  She has history of CHF with an ejection fraction of 25 to 30%, atrial flutter status post ablation, type 2 diabetes, mechanical AVR, morbid obesity, hypertension, hyperlipidemia.  On 11/10/2018, she went into ventricular fibrillation and received a shock from her ICD.  She was asleep and unaware of the time.  Today, denies symptoms of palpitations, chest pain, shortness of breath, orthopnea, PND, lower extremity edema, claudication, dizziness, presyncope, syncope, bleeding, or neurologic sequela. The patient is tolerating medications without difficulties.    Past Medical History:  Diagnosis Date  . Allergy   . Aortic insufficiency   . Aortic stenosis   . Arthritis   . Asthma   . CHF (congestive heart failure) (HCC)   . Claudication (HCC)   . Coronary artery disease   . Diabetes mellitus without  complication (HCC)   . GERD (gastroesophageal reflux disease)   . Heart murmur   . Hyperlipidemia   . Hypertension   . Nutritional and metabolic cardiomyopathy (HCC)   . PVD (peripheral vascular disease) (HCC)   . Sleep apnea   . Stroke [facility](HCC)     Past Surgical History:  Procedure Laterality Date  . A-FLUTTER ABLATION N/A 11/05/2018   Procedure: A-FLUTTER ABLATION;  Surgeon: Regan Lemmingamnitz, Brinton Brandel Martin, MD;  Location: [facility] INVASIVE CV LAB;  Service: Cardiovascular;  Laterality: N/A;  . CARDIAC CATHETERIZATION    . INSERT / REPLACE / REMOVE PACEMAKER     Medtronic ICD  . MECHANICAL AORTIC VALVE REPLACEMENT    . PERIPHERAL ARTERIAL STENT GRAFT    . TUBAL LIGATION      Current Outpatient Medications  Medication Sig Dispense Refill  . albuterol (PROVENTIL HFA;VENTOLIN HFA) 108 (90 Base) MCG/ACT inhaler Inhale 1-2 puffs into the lungs every 6 (six) hours as needed for wheezing or shortness of breath.    . ALPRAZolam (XANAX) 0.5 MG tablet Take 0.5 mg by mouth 3 (three) times daily as needed for anxiety.   0  . amiodarone (PACERONE) 200 MG tablet Take 200 mg by mouth daily after breakfast.   0  . carvedilol (COREG) 25 MG tablet Take 1 tablet (25 mg total) by mouth 2 (two) times daily. 180 tablet 1  . cephALEXin (KEFLEX) 500 MG capsule Take 1 capsule (500 mg total) by mouth 3 (three) times daily. 21 capsule 0  . clopidogrel (PLAVIX) 75 MG tablet Take 75 mg by mouth daily after breakfast.  6  . Cyanocobalamin (B-12) 500 MCG TABS Take 3 tablets by mouth daily.    Marland Kitchen EPIPEN 2-PAK 0.3 MG/0.3ML SOAJ injection Inject 0.3 mg into the muscle once.   0  . ezetimibe (ZETIA) 10 MG tablet Take 10 mg by mouth daily.    . fluticasone (FLONASE) 50 MCG/ACT nasal spray Place 2 sprays into both nostrils daily as needed for allergies.     . fluticasone furoate-vilanterol (BREO ELLIPTA) 200-25 MCG/INH AEPB Inhale 1 puff into the lungs daily.    . furosemide (LASIX) 20 MG tablet Take 3 tablets (60 mg total) by mouth 2  (two) times daily. 360 tablet 1  . gabapentin (NEURONTIN) 300 MG capsule Take 300 mg by mouth 3 (three) times daily.   5  . glimepiride (AMARYL) 4 MG tablet Take 4 mg by mouth daily with breakfast.     . ipratropium-albuterol (DUONEB) 0.5-2.5 (3) MG/3ML SOLN Take 3 mLs by nebulization 2 (two) times daily.     Marland Kitchen levothyroxine (SYNTHROID, LEVOTHROID) 50 MCG tablet Take 50 mcg by mouth daily before breakfast.    . linaclotide (LINZESS) 72 MCG capsule Take 72 mcg by mouth daily as needed (constipation).    Marland Kitchen loratadine (CLARITIN) 10 MG tablet Take 10 mg by mouth at bedtime.     . metFORMIN (GLUCOPHAGE) 500 MG tablet Take 500 mg by mouth 2 (two) times daily with a meal.     . metoprolol (TOPROL-XL) 200 MG 24 hr tablet Take 200 mg by mouth daily after breakfast.     . montelukast (SINGULAIR) 10 MG tablet Take 10 mg by mouth at bedtime.     . Multiple Vitamins-Minerals (CENTRUM SILVER ULTRA WOMENS) TABS Take 1 tablet by mouth daily.    Marland Kitchen neomycin-polymyxin-hydrocortisone (CORTISPORIN) OTIC solution Apply twice daily to left 1st toenail 10 mL 0  . omeprazole (PRILOSEC) 20 MG capsule Take 20 mg by mouth at bedtime.     . Red Yeast Rice 600 MG TABS Take 2 tablets by mouth daily.    . sacubitril-valsartan (ENTRESTO) 49-51 MG Take 1 tablet by mouth 2 (two) times daily. 60 tablet 3  . warfarin (COUMADIN) 1 MG tablet Take 1 mg by mouth See admin instructions. Take 6 mg on Sunday, Tuesday, and  Thursday . Take 5 mg on all other days    . warfarin (COUMADIN) 5 MG tablet Take 5 mg by mouth See admin instructions. Take 6 mg on Sunday, Tuesday, and  Thursday . Take 5 mg on all other days    . potassium chloride 20 MEQ TBCR Take 20 mEq by mouth daily. 90 tablet 1   No current facility-administered medications for this visit.     Allergies:   Iodinated diagnostic agents and Ioxaglate   Social History:  The patient  reports that she has quit smoking. She has never used smokeless tobacco. She reports that she does  not drink alcohol or use drugs.   Family History:  The patient's  family history includes Heart disease in her brother.   ROS:  Please see the history of present illness.   All other systems are personally reviewed and negative.    Exam:    Vital Signs:  BP 128/70   Pulse 65   Wt (!) 321 lb (145.6 kg)   BMI 46.06 kg/m   Over the phone, no acute distress, no shortness of breath.  Labs/Other Tests and Data Reviewed:    Recent Labs: 11/05/2018: Hemoglobin 11.8; Platelets 241 11/24/2018: NT-Pro BNP 668  12/29/2018: BUN 43; Creatinine, Ser 1.40; Magnesium 2.1; Potassium 4.2; Sodium 141   Wt Readings from Last 3 Encounters:  02/23/19 (!) 321 lb (145.6 kg)  12/29/18 (!) 304 lb 12.8 oz (138.3 kg)  11/24/18 (!) 307 lb (139.3 kg)     Other studies personally reviewed: Additional studies/ records that were reviewed today include: ECG 11/24/2018 personally reviewed Review of the above records today demonstrates: Sinus rhythm, first-degree AV block    ASSESSMENT & PLAN:    1.  Typical atrial flutter: Status post ablation 11/06/2018.  To her knowledge, she has had no further episodes.  On warfarin. [place] discuss with her primary cardiolgist if we can stop warfarin as she has anAVR  This patients CHA2DS2-VASc Score and unadjusted Ischemic Stroke Rate (% per year) is equal to 2.2 % stroke rate/year from a score of 2  Above score calculated as 1 point each if present [CHF, HTN, DM, Vascular=MI/PAD/Aortic Plaque, Age if 65-74, or Female] Above score calculated as 2 points each if present [Age > 75, or Stroke/TIA/TE]  2.  Nonischemic cardiomyopathy: Status post Medtronic ICD.  Currently on optimal medical therapy. No volume overlaod noted  3.  Hypertension: wellcontrolled  4.  Aortic valve replacement: Plan per primary cardiology  5.  Obstructive sleep apnea: CPAP compliance encouraged  6.  Ventricular fibrillation: Patient received ICD shock for VF on 11/10/2018 QTC is certainly prolonged.   She was put on amiodarone and Entresto was increased.   COVID 19 screen The patient denies symptoms of COVID 19 at this time.  The importance of social distancing was discussed today.  Follow-up:  6 months  Current medicines are reviewed at length with the patient today.   The patient does not have concerns regarding her medicines.  The following changes were made today:  none  Labs/ tests ordered today include:  No orders of the defined types were placed in this encounter.    Patient Risk:  after full review of this patients clinical status, I feel that they are at moderate risk at this time.  Today, I have spent 12 minutes with the patient with telehealth technology discussing atrial flutter, VF .    Signed, Alynah Schone Jorja Loa, MD  02/23/2019 12:01 PM     [facility] [address] [place] [address] [phone] (office) [phone] (fax)

## 2019-02-23 NOTE — Telephone Encounter (Signed)
New message   Please call the patient about her virtual visit today. She is waiting for her virtual visit.

## 2019-02-23 NOTE — Patient Instructions (Signed)
Opened in error

## 2019-02-23 NOTE — Telephone Encounter (Signed)
Rescheduled pt for 12:00 today. Pt agreeable

## 2019-02-28 ENCOUNTER — Other Ambulatory Visit: Payer: Self-pay | Admitting: Cardiology

## 2019-03-02 DIAGNOSIS — R791 Abnormal coagulation profile: Secondary | ICD-10-CM | POA: Diagnosis not present

## 2019-03-02 NOTE — Telephone Encounter (Signed)
Rx refill sent to pharmacy. 

## 2019-03-03 ENCOUNTER — Other Ambulatory Visit: Payer: Self-pay

## 2019-03-03 ENCOUNTER — Ambulatory Visit (INDEPENDENT_AMBULATORY_CARE_PROVIDER_SITE_OTHER): Payer: Medicare Other | Admitting: *Deleted

## 2019-03-03 DIAGNOSIS — I4901 Ventricular fibrillation: Secondary | ICD-10-CM

## 2019-03-03 DIAGNOSIS — I5023 Acute on chronic systolic (congestive) heart failure: Secondary | ICD-10-CM | POA: Diagnosis not present

## 2019-03-04 ENCOUNTER — Ambulatory Visit: Payer: Medicare Other | Admitting: Sports Medicine

## 2019-03-04 LAB — CUP PACEART REMOTE DEVICE CHECK
Battery Remaining Longevity: 111 mo
Battery Voltage: 3.01 V
Brady Statistic AP VP Percent: 0.01 %
Brady Statistic AP VS Percent: 6.75 %
Brady Statistic AS VP Percent: 0.03 %
Brady Statistic AS VS Percent: 93.21 %
Brady Statistic RA Percent Paced: 6.73 %
Brady Statistic RV Percent Paced: 0.04 %
Date Time Interrogation Session: 20200512073524
HighPow Impedance: 75 Ohm
Implantable Lead Implant Date: 20180102
Implantable Lead Implant Date: 20180102
Implantable Lead Location: 753859
Implantable Lead Location: 753860
Implantable Lead Model: 5076
Implantable Pulse Generator Implant Date: 20180102
Lead Channel Impedance Value: 342 Ohm
Lead Channel Impedance Value: 437 Ohm
Lead Channel Impedance Value: 494 Ohm
Lead Channel Pacing Threshold Amplitude: 0.375 V
Lead Channel Pacing Threshold Amplitude: 1.125 V
Lead Channel Pacing Threshold Pulse Width: 0.4 ms
Lead Channel Pacing Threshold Pulse Width: 0.4 ms
Lead Channel Sensing Intrinsic Amplitude: 20.125 mV
Lead Channel Sensing Intrinsic Amplitude: 20.125 mV
Lead Channel Sensing Intrinsic Amplitude: 3.25 mV
Lead Channel Sensing Intrinsic Amplitude: 3.25 mV
Lead Channel Setting Pacing Amplitude: 1.5 V
Lead Channel Setting Pacing Amplitude: 2.25 V
Lead Channel Setting Pacing Pulse Width: 0.4 ms
Lead Channel Setting Sensing Sensitivity: 0.3 mV

## 2019-03-05 ENCOUNTER — Telehealth: Payer: Self-pay | Admitting: Emergency Medicine

## 2019-03-05 ENCOUNTER — Telehealth (INDEPENDENT_AMBULATORY_CARE_PROVIDER_SITE_OTHER): Payer: Medicare Other | Admitting: Cardiology

## 2019-03-05 ENCOUNTER — Other Ambulatory Visit: Payer: Self-pay

## 2019-03-05 ENCOUNTER — Telehealth: Payer: Self-pay | Admitting: Cardiology

## 2019-03-05 ENCOUNTER — Encounter: Payer: Self-pay | Admitting: Cardiology

## 2019-03-05 DIAGNOSIS — I480 Paroxysmal atrial fibrillation: Secondary | ICD-10-CM

## 2019-03-05 DIAGNOSIS — I10 Essential (primary) hypertension: Secondary | ICD-10-CM

## 2019-03-05 DIAGNOSIS — Z952 Presence of prosthetic heart valve: Secondary | ICD-10-CM

## 2019-03-05 DIAGNOSIS — I420 Dilated cardiomyopathy: Secondary | ICD-10-CM | POA: Diagnosis not present

## 2019-03-05 DIAGNOSIS — Z95810 Presence of automatic (implantable) cardiac defibrillator: Secondary | ICD-10-CM

## 2019-03-05 NOTE — Telephone Encounter (Signed)
Left message for patient to return call she needs a 3 month follow up appointment.

## 2019-03-05 NOTE — Patient Instructions (Signed)
Medication Instructions:  Your physician recommends that you continue on your current medications as directed. Please refer to the Current Medication list given to you today.  If you need a refill on your cardiac medications before your next appointment, please call your pharmacy.   Lab work: None.  If you have labs (blood work) drawn today and your tests are completely normal, you will receive your results only by: . MyChart Message (if you have MyChart) OR . A paper copy in the mail If you have any lab test that is abnormal or we need to change your treatment, we will call you to review the results.  Testing/Procedures: None.   Follow-Up: At [facility], you and your health needs are our priority.  As part of our continuing mission to provide you with exceptional heart care, we have created designated Provider Care Teams.  These Care Teams include your primary Cardiologist (physician) and Advanced Practice Providers (APPs -  Physician Assistants and Nurse Practitioners) who all work together to provide you with the care you need, when you need it. You will need a follow up appointment in 3 months.  Please call our office 2 months in advance to schedule this appointment.  You may see Robert Krasowski, MD or another member of our [facility] Provider Team in [place]: Brian Munley, MD . Rajan Revankar, MD  Any Other Special Instructions Will Be Listed Below (If Applicable).     

## 2019-03-05 NOTE — Telephone Encounter (Signed)
Virtual Visit Pre-Appointment Phone Call  "(Name), I am calling you today to discuss your upcoming appointment. We are currently trying to limit exposure to the virus that causes COVID-19 by seeing patients at home rather than in the office."  1. "What is the BEST phone number to call the day of the visit?" - include this in appointment notes  2. Do you have or have access to (through a family member/friend) a smartphone with video capability that we can use for your visit?" a. If yes - list this number in appt notes as cell (if different from BEST phone #) and list the appointment type as a VIDEO visit in appointment notes b. If no - list the appointment type as a PHONE visit in appointment notes  3. Confirm consent - "In the setting of the current Covid19 crisis, you are scheduled for a (phone or video) visit with your provider on (date) at (time).  Just as we do with many in-office visits, in order for you to participate in this visit, we must obtain consent.  If you'd like, I can send this to your mychart (if signed up) or email for you to review.  Otherwise, I can obtain your verbal consent now.  All virtual visits are billed to your insurance company just like a normal visit would be.  By agreeing to a virtual visit, we'd like you to understand that the technology does not allow for your provider to perform an examination, and thus may limit your provider's ability to fully assess your condition. If your provider identifies any concerns that need to be evaluated in person, we will make arrangements to do so.  Finally, though the technology is pretty good, we cannot assure that it will always work on either your or our end, and in the setting of a video visit, we may have to convert it to a phone-only visit.  In either situation, we cannot ensure that we have a secure connection.  Are you willing to proceed?" STAFF: Did the patient verbally acknowledge consent to telehealth visit? Document  YES/NO here: YES  4. Advise patient to be prepared - "Two hours prior to your appointment, go ahead and check your blood pressure, pulse, oxygen saturation, and your weight (if you have the equipment to check those) and write them all down. When your visit starts, your provider will ask you for this information. If you have an Apple Watch or Kardia device, please plan to have heart rate information ready on the day of your appointment. Please have a pen and paper handy nearby the day of the visit as well."  5. Give patient instructions for MyChart download to smartphone OR Doximity/Doxy.me as below if video visit (depending on what platform provider is using)  6. Inform patient they will receive a phone call 15 minutes prior to their appointment time (may be from unknown caller ID) so they should be prepared to answer    TELEPHONE CALL NOTE  Shannon Baxter has been deemed a candidate for a follow-up tele-health visit to limit community exposure during the Covid-19 pandemic. I spoke with the patient via phone to ensure availability of phone/video source, confirm preferred email & phone number, and discuss instructions and expectations.  I reminded Shannon Baxter to be prepared with any vital sign and/or heart rhythm information that could potentially be obtained via home monitoring, at the time of her visit. I reminded Shannon Baxter to expect a phone call prior to her visit.  [name] 03/05/2019 12:59 PM   INSTRUCTIONS FOR DOWNLOADING THE MYCHART APP TO SMARTPHONE  - The patient must first make sure to have activated MyChart and know their login information - If Apple, go to [employer] and type in MyChart in the search bar and download the app. If Android, ask patient to go to [employer] and type in [place] in the search bar and download the app. The app is free but as with any other app downloads, their phone may require them to verify saved payment information or Apple/Android  password.  - The patient will need to then log into the app with their MyChart username and password, and select [place] as their healthcare provider to link the account. When it is time for your visit, go to the MyChart app, find appointments, and click Begin Video Visit. Be sure to Select Allow for your device to access the Microphone and Camera for your visit. You will then be connected, and your provider will be with you shortly.  **If they have any issues connecting, or need assistance please contact MyChart service desk [phone])**  **If using a computer, in order to ensure the best quality for their visit they will need to use either of the following Internet Browsers: [employer] or Google Chrome**  IF USING DOXIMITY or DOXY.ME - The patient will receive a link just prior to their visit by text.     FULL LENGTH CONSENT FOR TELE-HEALTH VISIT   I hereby voluntarily request, consent and authorize [facility] and its employed or contracted physicians, physician assistants, nurse practitioners or other licensed health care professionals (the Practitioner), to provide me with telemedicine health care services (the Services") as deemed necessary by the treating Practitioner. I acknowledge and consent to receive the Services by the Practitioner via telemedicine. I understand that the telemedicine visit will involve communicating with the Practitioner through live audiovisual communication technology and the disclosure of certain medical information by electronic transmission. I acknowledge that I have been given the opportunity to request an in-person assessment or other available alternative prior to the telemedicine visit and am voluntarily participating in the telemedicine visit.  I understand that I have the right to withhold or withdraw my consent to the use of telemedicine in the course of my care at any time, without affecting my right to future care or treatment,  and that the Practitioner or I may terminate the telemedicine visit at any time. I understand that I have the right to inspect all information obtained and/or recorded in the course of the telemedicine visit and may receive copies of available information for a reasonable fee.  I understand that some of the potential risks of receiving the Services via telemedicine include:   Delay or interruption in medical evaluation due to technological equipment failure or disruption;  Information transmitted may not be sufficient (e.g. poor resolution of images) to allow for appropriate medical decision making by the Practitioner; and/or   In rare instances, security protocols could fail, causing a breach of personal health information.  Furthermore, I acknowledge that it is my responsibility to provide information about my medical history, conditions and care that is complete and accurate to the best of my ability. I acknowledge that Practitioner's advice, recommendations, and/or decision may be based on factors not within their control, such as incomplete or inaccurate data provided by me or distortions of diagnostic images or specimens that may result from electronic transmissions. I understand that the  practice of medicine is not an Chief Strategy Officer and that Practitioner makes no warranties or guarantees regarding treatment outcomes. I acknowledge that I will receive a copy of this consent concurrently upon execution via email to the email address I last provided but may also request a printed copy by calling the office of [place].    I understand that my insurance will be billed for this visit.   I have read or had this consent read to me.  I understand the contents of this consent, which adequately explains the benefits and risks of the Services being provided via telemedicine.   I have been provided ample opportunity to ask questions regarding this consent and the Services and have had my questions  answered to my satisfaction.  I give my informed consent for the services to be provided through the use of telemedicine in my medical care  By participating in this telemedicine visit I agree to the above.

## 2019-03-05 NOTE — Progress Notes (Signed)
Virtual Visit via Telephone Note   This visit type was conducted due to national recommendations for restrictions regarding the COVID-19 Pandemic (e.g. social distancing) in an effort to limit this patient's exposure and mitigate transmission in our community.  Due to her co-morbid illnesses, this patient is at least at moderate risk for complications without adequate follow up.  This format is felt to be most appropriate for this patient at this time.  The patient did not have access to video technology/had technical difficulties with video requiring transitioning to audio format only (telephone).  All issues noted in this document were discussed and addressed.  No physical exam could be performed with this format.  Please refer to the patient's chart for her  consent to telehealth for [facility].  Evaluation Performed:  Follow-up visit  This visit type was conducted due to national recommendations for restrictions regarding the COVID-19 Pandemic (e.g. social distancing).  This format is felt to be most appropriate for this patient at this time.  All issues noted in this document were discussed and addressed.  No physical exam was performed (except for noted visual exam findings with Video Visits).  Please refer to the patient's chart (MyChart message for video visits and phone note for telephone visits) for the patient's consent to telehealth for [facility].  Date:  03/05/2019  ID: Shannon Baxter, DOB 1961/10/21, MRN [id number]   Patient Location: [address] [place] [address]   Provider location:   [facility] [place] [facility]  PCP:  Paulina Fusi, MD  Cardiologist:  Gypsy Balsam, MD     Chief Complaint: Doing well  History of Present Illness:    Shannon Baxter is a 58 y.o. female  who presents via audio/video conferencing for a telehealth visit today.  With complex past medical history which include cardiomyopathy ejection fraction 30 to 35%, status post aortic valve  replacement, coronary artery disease, diabetes.  Overall she is doing well I am talking to her over the phone she has no technical ability to establish video link.  She is doing well denies have any problems.  No swelling of lower extremities no shortness of breath.   The patient does not have symptoms concerning for COVID-19 infection (fever, chills, cough, or new SHORTNESS OF BREATH).    Prior CV studies:   The following studies were reviewed today:       Past Medical History:  Diagnosis Date  . Allergy   . Aortic insufficiency   . Aortic stenosis   . Arthritis   . Asthma   . CHF (congestive heart failure) (HCC)   . Claudication (HCC)   . Coronary artery disease   . Diabetes mellitus without complication (HCC)   . GERD (gastroesophageal reflux disease)   . Heart murmur   . Hyperlipidemia   . Hypertension   . Nutritional and metabolic cardiomyopathy (HCC)   . PVD (peripheral vascular disease) (HCC)   . Sleep apnea   . Stroke [facility])     Past Surgical History:  Procedure Laterality Date  . A-FLUTTER ABLATION N/A 11/05/2018   Procedure: A-FLUTTER ABLATION;  Surgeon: Regan Lemming, MD;  Location: [facility] INVASIVE CV LAB;  Service: Cardiovascular;  Laterality: N/A;  . CARDIAC CATHETERIZATION    . INSERT / REPLACE / REMOVE PACEMAKER     Medtronic ICD  . MECHANICAL AORTIC VALVE REPLACEMENT    . PERIPHERAL ARTERIAL STENT GRAFT    . TUBAL LIGATION       Current  Meds  Medication Sig  . albuterol (PROVENTIL HFA;VENTOLIN HFA) 108 (90 Base) MCG/ACT inhaler Inhale 1-2 puffs into the lungs every 6 (six) hours as needed for wheezing or shortness of breath.  . ALPRAZolam (XANAX) 0.5 MG tablet Take 0.5 mg by mouth 3 (three) times daily as needed for anxiety.   Marland Kitchen amiodarone (PACERONE) 200 MG tablet Take 200 mg by mouth daily after breakfast.   . carvedilol (COREG) 25 MG tablet Take 1 tablet (25 mg total) by mouth 2 (two) times daily.  . cephALEXin (KEFLEX) 500 MG capsule Take 1  capsule (500 mg total) by mouth 3 (three) times daily.  . clopidogrel (PLAVIX) 75 MG tablet Take 75 mg by mouth daily after breakfast.   . Cyanocobalamin (B-12) 500 MCG TABS Take 3 tablets by mouth daily.  Marland Kitchen EPIPEN 2-PAK 0.3 MG/0.3ML SOAJ injection Inject 0.3 mg into the muscle once.   . ezetimibe (ZETIA) 10 MG tablet Take 10 mg by mouth daily.  . fluticasone (FLONASE) 50 MCG/ACT nasal spray Place 2 sprays into both nostrils daily as needed for allergies.   . fluticasone furoate-vilanterol (BREO ELLIPTA) 200-25 MCG/INH AEPB Inhale 1 puff into the lungs daily.  . furosemide (LASIX) 20 MG tablet TAKE 3 TABLETS (60 MG TOTAL) BY MOUTH 2 (TWO) TIMES DAILY.  Marland Kitchen gabapentin (NEURONTIN) 300 MG capsule Take 300 mg by mouth 3 (three) times daily.   Marland Kitchen glimepiride (AMARYL) 4 MG tablet Take 4 mg by mouth daily with breakfast.   . ipratropium-albuterol (DUONEB) 0.5-2.5 (3) MG/3ML SOLN Take 3 mLs by nebulization 2 (two) times daily.   Marland Kitchen levothyroxine (SYNTHROID, LEVOTHROID) 50 MCG tablet Take 50 mcg by mouth daily before breakfast.  . linaclotide (LINZESS) 72 MCG capsule Take 72 mcg by mouth daily as needed (constipation).  Marland Kitchen loratadine (CLARITIN) 10 MG tablet Take 10 mg by mouth at bedtime.   . metFORMIN (GLUCOPHAGE) 500 MG tablet Take 500 mg by mouth 2 (two) times daily with a meal.   . metoprolol (TOPROL-XL) 200 MG 24 hr tablet Take 200 mg by mouth daily after breakfast.   . montelukast (SINGULAIR) 10 MG tablet Take 10 mg by mouth at bedtime.   . Multiple Vitamins-Minerals (CENTRUM SILVER ULTRA WOMENS) TABS Take 1 tablet by mouth daily.  Marland Kitchen neomycin-polymyxin-hydrocortisone (CORTISPORIN) OTIC solution Apply twice daily to left 1st toenail  . omeprazole (PRILOSEC) 20 MG capsule Take 20 mg by mouth at bedtime.   . potassium chloride 20 MEQ TBCR Take 20 mEq by mouth daily.  . Red Yeast Rice 600 MG TABS Take 2 tablets by mouth daily.  . sacubitril-valsartan (ENTRESTO) 49-51 MG Take 1 tablet by mouth 2 (two) times  daily.  Marland Kitchen warfarin (COUMADIN) 1 MG tablet Take 1 mg by mouth See admin instructions. Take 6 mg on Sunday, Tuesday, and  Thursday . Take 5 mg on all other days  . warfarin (COUMADIN) 5 MG tablet Take 5 mg by mouth See admin instructions. Take 6 mg on Sunday, Tuesday, and  Thursday . Take 5 mg on all other days      Family History: The patient's family history includes Heart disease in her brother.   ROS:   Please see the history of present illness.     All other systems reviewed and are negative.   Labs/Other Tests and Data Reviewed:     Recent Labs: 11/05/2018: Hemoglobin 11.8; Platelets 241 11/24/2018: NT-Pro BNP 668 12/29/2018: BUN 43; Creatinine, Ser 1.40; Magnesium 2.1; Potassium 4.2; Sodium 141  Recent Lipid  Panel    Component Value Date/Time   CHOL  07/04/2007 0815    200        ATP III CLASSIFICATION:  <200     mg/dL   Desirable  [id number]  mg/dL   Borderline High  >=914>=240    mg/dL   High   TRIG 782139 95/62/130809/09/2007 0815   HDL 47 07/04/2007 0815   CHOLHDL 4.3 07/04/2007 0815   VLDL 28 07/04/2007 0815   LDLCALC (H) 07/04/2007 0815    125        Total Cholesterol/HDL:CHD Risk Coronary Heart Disease Risk Table                     Men   Women  1/2 Average Risk   3.4   3.3      Exam:    Vital Signs:  There were no vitals taken for this visit.    Wt Readings from Last 3 Encounters:  02/23/19 (!) 321 lb (145.6 kg)  12/29/18 (!) 304 lb 12.8 oz (138.3 kg)  11/24/18 (!) 307 lb (139.3 kg)     Well nourished, well developed in no acute distress. Alert awake and at the time 3 happy to be able to talk to me asymptomatic today  Diagnosis for this visit:   1. Dilated cardiomyopathy (HCC)   2. Essential hypertension   3. Paroxysmal atrial fibrillation (HCC)   4. Presence of automatic implantable cardioverter-defibrillator   5. Status post aortic valve replacement      ASSESSMENT & PLAN:    1.  Dilated cardiomyopathy on appropriate medications which include Entresto as  well as beta-blocker I will continue. 2.  Essential hypertension blood pressure well controlled we will continue present management. 3.  Paroxysmal atrial fibrillation she is anticoagulated which I will continue. 4.  Status aortic valve replacement valve functioning properly we will continue monitoring. 5.  Presence of ICD follow-up by our EP team.  Overall she is doing very well continue present management  COVID-19 Education: The signs and symptoms of COVID-19 were discussed with the patient and how to seek care for testing (follow up with PCP or arrange E-visit).  The importance of social distancing was discussed today.  Patient Risk:   After full review of this patients clinical status, I feel that they are at least moderate risk at this time.  Time:   Today, I have spent 15 minutes with the patient with telehealth technology discussing pt health issues.  I spent 5 minutes reviewing her chart before the visit.  Visit was finished at 10:27 AM.    Medication Adjustments/Labs and Tests Ordered: Current medicines are reviewed at length with the patient today.  Concerns regarding medicines are outlined above.  No orders of the defined types were placed in this encounter.  Medication changes: No orders of the defined types were placed in this encounter.    Disposition: Follow-up in 3 months  Signed, Georgeanna Leaobert J. Jamael Hoffmann, MD, [facility] 03/05/2019 11:28 AM    [facility]

## 2019-03-06 ENCOUNTER — Encounter: Payer: Self-pay | Admitting: Sports Medicine

## 2019-03-06 ENCOUNTER — Ambulatory Visit (INDEPENDENT_AMBULATORY_CARE_PROVIDER_SITE_OTHER): Payer: Medicare Other | Admitting: Sports Medicine

## 2019-03-06 VITALS — BP 125/74 | HR 76 | Temp 96.9°F | Resp 16

## 2019-03-06 DIAGNOSIS — IMO0002 Reserved for concepts with insufficient information to code with codable children: Secondary | ICD-10-CM

## 2019-03-06 DIAGNOSIS — E1140 Type 2 diabetes mellitus with diabetic neuropathy, unspecified: Secondary | ICD-10-CM

## 2019-03-06 DIAGNOSIS — M79675 Pain in left toe(s): Secondary | ICD-10-CM

## 2019-03-06 DIAGNOSIS — B351 Tinea unguium: Secondary | ICD-10-CM

## 2019-03-06 DIAGNOSIS — M79674 Pain in right toe(s): Secondary | ICD-10-CM | POA: Diagnosis not present

## 2019-03-06 DIAGNOSIS — E1165 Type 2 diabetes mellitus with hyperglycemia: Secondary | ICD-10-CM

## 2019-03-06 NOTE — Progress Notes (Signed)
Subjective: Shannon Baxter is a 58 y.o. female patient with history of diabetes who presents to office today complaining of long, painful nails while ambulating in shoes; unable to trim. Patient states that the glucose reading this morning was 110 mg/dl. Last A1c 6.1. Patient denies any new changes in medication or new problems.   PCP Dr. Tomasa Blase  Patient Active Problem List   Diagnosis Date Noted  . Paroxysmal atrial flutter (HCC) 09/01/2018  . Chronic systolic congestive heart failure (HCC) 12/06/2016  . Morbid obesity (HCC) 12/06/2016  . Presence of automatic implantable cardioverter-defibrillator 10/23/2016  . Dilated cardiomyopathy (HCC) 09/03/2016  . Status post aortic valve replacement 03/29/2016  . Paroxysmal atrial fibrillation (HCC) 03/06/2016  . Rheumatic aortic valve insufficiency 03/05/2016  . Acute on chronic systolic heart failure (HCC) 02/21/2016  . Hypothyroidism 01/23/2016  . Aortic valve stenosis, rheumatic 11/22/2015  . Type 2 diabetes mellitus with diabetic peripheral angiopathy without gangrene, without long-term current use of insulin (HCC) 11/22/2015  . Dyspnea on exertion 10/26/2015  . Atherosclerosis of native arteries of extremities with intermittent claudication, bilateral legs (HCC) 08/04/2015  . Carotid atherosclerosis 07/26/2015  . Coronary artery disease involving native coronary artery of native heart without angina pectoris 07/26/2015  . Dyslipidemia 07/26/2015  . Essential hypertension 07/26/2015  . Intermittent claudication (HCC) 07/26/2015  . Peripheral vascular disease (HCC) 07/26/2015  . History of tobacco abuse 07/26/2015   Current Outpatient Medications on File Prior to Visit  Medication Sig Dispense Refill  . albuterol (PROVENTIL HFA;VENTOLIN HFA) 108 (90 Base) MCG/ACT inhaler Inhale 1-2 puffs into the lungs every 6 (six) hours as needed for wheezing or shortness of breath.    . ALPRAZolam (XANAX) 0.5 MG tablet Take 0.5 mg by mouth 3 (three)  times daily as needed for anxiety.   0  . amiodarone (PACERONE) 200 MG tablet Take 200 mg by mouth daily after breakfast.   0  . carvedilol (COREG) 25 MG tablet Take 1 tablet (25 mg total) by mouth 2 (two) times daily. 180 tablet 1  . cephALEXin (KEFLEX) 500 MG capsule Take 1 capsule (500 mg total) by mouth 3 (three) times daily. 21 capsule 0  . clopidogrel (PLAVIX) 75 MG tablet Take 75 mg by mouth daily after breakfast.   6  . Cyanocobalamin (B-12) 500 MCG TABS Take 3 tablets by mouth daily.    Marland Kitchen EPIPEN 2-PAK 0.3 MG/0.3ML SOAJ injection Inject 0.3 mg into the muscle once.   0  . ezetimibe (ZETIA) 10 MG tablet Take 10 mg by mouth daily.    . fluticasone (FLONASE) 50 MCG/ACT nasal spray Place 2 sprays into both nostrils daily as needed for allergies.     . fluticasone furoate-vilanterol (BREO ELLIPTA) 200-25 MCG/INH AEPB Inhale 1 puff into the lungs daily.    . furosemide (LASIX) 20 MG tablet TAKE 3 TABLETS (60 MG TOTAL) BY MOUTH 2 (TWO) TIMES DAILY. 360 tablet 1  . gabapentin (NEURONTIN) 300 MG capsule Take 300 mg by mouth 3 (three) times daily.   5  . glimepiride (AMARYL) 4 MG tablet Take 4 mg by mouth daily with breakfast.     . ipratropium-albuterol (DUONEB) 0.5-2.5 (3) MG/3ML SOLN Take 3 mLs by nebulization 2 (two) times daily.     Marland Kitchen levothyroxine (SYNTHROID, LEVOTHROID) 50 MCG tablet Take 50 mcg by mouth daily before breakfast.    . linaclotide (LINZESS) 72 MCG capsule Take 72 mcg by mouth daily as needed (constipation).    Marland Kitchen loratadine (CLARITIN) 10 MG tablet Take  10 mg by mouth at bedtime.     . metFORMIN (GLUCOPHAGE) 500 MG tablet Take 500 mg by mouth 2 (two) times daily with a meal.     . metoprolol (TOPROL-XL) 200 MG 24 hr tablet Take 200 mg by mouth daily after breakfast.     . montelukast (SINGULAIR) 10 MG tablet Take 10 mg by mouth at bedtime.     . Multiple Vitamins-Minerals (CENTRUM SILVER ULTRA WOMENS) TABS Take 1 tablet by mouth daily.    Marland Kitchen neomycin-polymyxin-hydrocortisone  (CORTISPORIN) OTIC solution Apply twice daily to left 1st toenail 10 mL 0  . omeprazole (PRILOSEC) 20 MG capsule Take 20 mg by mouth at bedtime.     . potassium chloride 20 MEQ TBCR Take 20 mEq by mouth daily. 90 tablet 1  . Red Yeast Rice 600 MG TABS Take 2 tablets by mouth daily.    . sacubitril-valsartan (ENTRESTO) 49-51 MG Take 1 tablet by mouth 2 (two) times daily. 60 tablet 3  . warfarin (COUMADIN) 1 MG tablet Take 1 mg by mouth See admin instructions. Take 6 mg on Sunday, Tuesday, and  Thursday . Take 5 mg on all other days    . warfarin (COUMADIN) 5 MG tablet Take 5 mg by mouth See admin instructions. Take 6 mg on Sunday, Tuesday, and  Thursday . Take 5 mg on all other days     No current facility-administered medications on file prior to visit.    Allergies  Allergen Reactions  . Iodinated Diagnostic Agents Hives and Rash  . Ioxaglate Hives    Recent Results (from the past 2160 hour(s))  Basic metabolic panel     Status: Abnormal   Collection Time: 12/29/18 12:22 PM  Result Value Ref Range   Glucose 92 65 - 99 mg/dL   BUN 43 (H) 6 - 24 mg/dL   Creatinine, Ser 3.66 (H) 0.57 - 1.00 mg/dL   GFR calc non Af Amer 42 (L) >59 mL/min/1.73   GFR calc Af Amer 48 (L) >59 mL/min/1.73   BUN/Creatinine Ratio 31 (H) 9 - 23   Sodium 141 134 - 144 mmol/L   Potassium 4.2 3.5 - 5.2 mmol/L   Chloride 101 96 - 106 mmol/L   CO2 21 20 - 29 mmol/L   Calcium 8.9 8.7 - 10.2 mg/dL  Magnesium     Status: None   Collection Time: 12/29/18 12:22 PM  Result Value Ref Range   Magnesium 2.1 1.6 - 2.3 mg/dL  CUP PACEART REMOTE DEVICE CHECK     Status: None   Collection Time: 03/03/19  7:35 AM  Result Value Ref Range   Date Time Interrogation Session [id number]    Pulse Generator Manufacturer MERM    Pulse Gen Model [id number] Evera MRI XT DR    Pulse Gen Serial Number [id number]    Clinic Name [facility]    Implantable Pulse Generator Type Implantable Cardiac Defibulator    Implantable Pulse  Generator Implant Date [id number]    Implantable Lead Manufacturer MERM    Implantable Lead Model 5076 CapSureFix Novus MRI SureScan    Implantable Lead Serial Number [id number]    Implantable Lead Implant Date [id number]    Implantable Lead Location Detail 1 UNKNOWN    Implantable Lead Location [id number]    Implantable Lead Manufacturer [facility]    Implantable Lead Model [id number] Sprint Quattro Secure S MRI SureScan    Implantable Lead Serial Number [id number]    Implantable Lead Implant Date [id number]    Implantable Lead  Location Detail 1 UNKNOWN    Implantable Lead Location [id number]    Lead Channel Setting Sensing Sensitivity 0.3 mV   Lead Channel Setting Pacing Amplitude 1.5 V   Lead Channel Setting Pacing Pulse Width 0.4 ms   Lead Channel Setting Pacing Amplitude 2.25 V   Lead Channel Impedance Value 494 ohm   Lead Channel Sensing Intrinsic Amplitude 3.25 mV   Lead Channel Sensing Intrinsic Amplitude 3.25 mV   Lead Channel Pacing Threshold Amplitude 0.375 V   Lead Channel Pacing Threshold Pulse Width 0.4 ms   Lead Channel Impedance Value 437 ohm   Lead Channel Impedance Value 342 ohm   Lead Channel Sensing Intrinsic Amplitude 20.125 mV   Lead Channel Sensing Intrinsic Amplitude 20.125 mV   Lead Channel Pacing Threshold Amplitude 1.125 V   Lead Channel Pacing Threshold Pulse Width 0.4 ms   HighPow Impedance 75 ohm   Battery Status OK    Battery Remaining Longevity 111 mo   Battery Voltage 3.01 V   Brady Statistic RA Percent Paced 6.73 %   Brady Statistic RV Percent Paced 0.04 %   Brady Statistic AP VP Percent 0.01 %   Brady Statistic AS VP Percent 0.03 %   Brady Statistic AP VS Percent 6.75 %   Brady Statistic AS VS Percent 93.21 %    Objective: General: Patient is awake, alert, and oriented x 3 and in no acute distress.  Integument: Skin is warm, dry and supple bilateral. Nails are tender, long, thickened and dystrophic with subungual debris, consistent with onychomycosis, 1-5  bilateral. Left hallux nail appears to be growing out.  No signs of infection. No open lesions or preulcerative lesions present bilateral. Remaining integument unremarkable.  Vasculature:  Dorsalis Pedis pulse 1/4 bilateral. Posterior Tibial pulse  0/4 bilateral. Capillary fill time <3 sec 1-5 bilateral. Positive hair growth to the level of the digits.Temperature gradient within normal limits. No varicosities present bilateral. No edema present bilateral.   Neurology: The patient has intact sensation measured with a 5.07/10g Semmes Weinstein Monofilament at all pedal sites bilateral. Vibratory sensation diminished bilateral with tuning fork. No Babinski sign present bilateral.   Musculoskeletal:Asymptomatic pes planus pedal deformities noted bilateral. Muscular strength 5/5 in all lower extremity muscular groups bilateral. No tenderness with calf compression bilateral.  Assessment and Plan: Problem List Items Addressed This Visit    None    Visit Diagnoses    Pain due to onychomycosis of toenails of both feet    -  Primary   Type 2 diabetes, uncontrolled, with neuropathy (HCC)         -Examined patient. -Discussed and educated patient on diabetic foot care, especially with  regards to the vascular, neurological and musculoskeletal systems.  -Mechanically debrided all nails 1-5 bilateral using sterile nail nipper and filed with dremel. There was a small nick at right 1st toe at medial corner that was treated with lumicane. I advised patient to monitor and apply neosporin for 1 week; if area worsens return to office ASAP -Patient to return  in 3 months for at risk foot care -Patient advised to call the office if any problems or questions arise in the meantime.  Asencion Islamitorya Josiyah Tozzi, DPM

## 2019-03-09 DIAGNOSIS — R791 Abnormal coagulation profile: Secondary | ICD-10-CM | POA: Diagnosis not present

## 2019-03-12 DIAGNOSIS — M1711 Unilateral primary osteoarthritis, right knee: Secondary | ICD-10-CM | POA: Diagnosis not present

## 2019-03-13 DIAGNOSIS — R791 Abnormal coagulation profile: Secondary | ICD-10-CM | POA: Diagnosis not present

## 2019-03-18 ENCOUNTER — Other Ambulatory Visit: Payer: Self-pay | Admitting: Cardiology

## 2019-03-18 NOTE — Progress Notes (Signed)
Remote ICD transmission.   

## 2019-03-30 DIAGNOSIS — R791 Abnormal coagulation profile: Secondary | ICD-10-CM | POA: Diagnosis not present

## 2019-04-01 ENCOUNTER — Other Ambulatory Visit: Payer: Self-pay | Admitting: Cardiology

## 2019-04-01 DIAGNOSIS — R791 Abnormal coagulation profile: Secondary | ICD-10-CM | POA: Diagnosis not present

## 2019-04-05 ENCOUNTER — Other Ambulatory Visit: Payer: Self-pay | Admitting: Cardiology

## 2019-04-08 DIAGNOSIS — R791 Abnormal coagulation profile: Secondary | ICD-10-CM | POA: Diagnosis not present

## 2019-04-09 DIAGNOSIS — E1165 Type 2 diabetes mellitus with hyperglycemia: Secondary | ICD-10-CM | POA: Diagnosis not present

## 2019-04-09 DIAGNOSIS — I10 Essential (primary) hypertension: Secondary | ICD-10-CM | POA: Diagnosis not present

## 2019-04-09 DIAGNOSIS — Z79899 Other long term (current) drug therapy: Secondary | ICD-10-CM | POA: Diagnosis not present

## 2019-04-09 DIAGNOSIS — E039 Hypothyroidism, unspecified: Secondary | ICD-10-CM | POA: Diagnosis not present

## 2019-04-09 DIAGNOSIS — I2510 Atherosclerotic heart disease of native coronary artery without angina pectoris: Secondary | ICD-10-CM | POA: Diagnosis not present

## 2019-04-09 DIAGNOSIS — E1140 Type 2 diabetes mellitus with diabetic neuropathy, unspecified: Secondary | ICD-10-CM | POA: Diagnosis not present

## 2019-04-09 DIAGNOSIS — E785 Hyperlipidemia, unspecified: Secondary | ICD-10-CM | POA: Diagnosis not present

## 2019-04-15 DIAGNOSIS — R791 Abnormal coagulation profile: Secondary | ICD-10-CM | POA: Diagnosis not present

## 2019-04-29 DIAGNOSIS — R791 Abnormal coagulation profile: Secondary | ICD-10-CM | POA: Diagnosis not present

## 2019-04-30 ENCOUNTER — Telehealth: Payer: Self-pay | Admitting: *Deleted

## 2019-04-30 NOTE — Telephone Encounter (Signed)
Pt would like surgical clearance for colonoscopy and it will be scheduled once we give clearance. Pt will have [facility] done with [facility] here in [place] on [address]. Please advise.

## 2019-05-01 DIAGNOSIS — R791 Abnormal coagulation profile: Secondary | ICD-10-CM | POA: Diagnosis not present

## 2019-05-01 NOTE — Telephone Encounter (Signed)
Please review chart and advise. Last appointment 03/05/2019.

## 2019-05-02 ENCOUNTER — Other Ambulatory Visit: Payer: Self-pay | Admitting: Cardiology

## 2019-05-03 NOTE — Telephone Encounter (Signed)
I would wait with clearence until I see her next time

## 2019-05-04 ENCOUNTER — Other Ambulatory Visit: Payer: Self-pay

## 2019-05-04 NOTE — Patient Outreach (Signed)
[facility]) Care Management  05/04/2019  Shannon Baxter 18-Dec-1960 [id number]    Referral received from [facility] team for complex case management.  Unsuccessful outreach attempt. Left voice message requesting a return call.    PLAN -Will attempt outreach within 3-4 business days.  [place] [phone]

## 2019-05-06 NOTE — Telephone Encounter (Signed)
Pt will see Dr. Agustin Cree in September and get clearance for colonoscopy then per Dr. Raliegh Ip. Pt was good with that.

## 2019-05-07 ENCOUNTER — Other Ambulatory Visit: Payer: Self-pay

## 2019-05-07 DIAGNOSIS — G4733 Obstructive sleep apnea (adult) (pediatric): Secondary | ICD-10-CM | POA: Diagnosis not present

## 2019-05-07 NOTE — Patient Outreach (Signed)
[facility] [facility]) Care Management  05/07/2019  Shannon Baxter 1960/11/05 [id number]    Successful outreach with Shannon Baxter. HIPAA identifiers verified. She reports being evaluated by her primary care provider two weeks ago. Denies concerns today. Reports feeling well.  She reports compliance with medications. Denies concerns regarding medication management or affordability. She reports compliance with treatment recommendations and monitors her weight and BP daily. Reports systolic range of 54'M to 120's. Diastolic range of 08'Q to 98's. States this is her normal range. Reports follow-up with Cardiology approximately every three months.  Morning weight-321 lbs.  Shannon Baxter denies immediate care management needs. Reports receiving in-home assistance with her CNA three days a week. Also reports that her daughter Shannon Baxter is available to assist as needed.  She is agreeable to follow-up outreach and completion of telephonic assessment next week. Current Outpatient Medications on File Prior to Visit  Medication Sig Dispense Refill  . albuterol (PROVENTIL HFA;VENTOLIN HFA) 108 (90 Base) MCG/ACT inhaler Inhale 1-2 puffs into the lungs every 6 (six) hours as needed for wheezing or shortness of breath.    . ALPRAZolam (XANAX) 0.5 MG tablet Take 0.5 mg by mouth 3 (three) times daily as needed for anxiety.   0  . amiodarone (PACERONE) 200 MG tablet Take 200 mg by mouth daily after breakfast.   0  . carvedilol (COREG) 25 MG tablet TAKE 1 TABLET BY MOUTH TWICE A DAY 180 tablet 1  . cephALEXin (KEFLEX) 500 MG capsule Take 1 capsule (500 mg total) by mouth 3 (three) times daily. 21 capsule 0  . clopidogrel (PLAVIX) 75 MG tablet Take 75 mg by mouth daily after breakfast.   6  . Cyanocobalamin (B-12) 500 MCG TABS Take 3 tablets by mouth daily.    Marland Kitchen ENTRESTO 49-51 MG TAKE 1 TABLET BY MOUTH TWICE A DAY 60 tablet 10  . EPIPEN 2-PAK 0.3 MG/0.3ML SOAJ injection Inject 0.3 mg into the muscle once.   0  .  ezetimibe (ZETIA) 10 MG tablet Take 10 mg by mouth daily.    . fluticasone (FLONASE) 50 MCG/ACT nasal spray Place 2 sprays into both nostrils daily as needed for allergies.     . fluticasone furoate-vilanterol (BREO ELLIPTA) 200-25 MCG/INH AEPB Inhale 1 puff into the lungs daily.    . furosemide (LASIX) 20 MG tablet Take 3 tablets (60 mg total) by mouth 2 (two) times daily. 180 tablet 1  . gabapentin (NEURONTIN) 300 MG capsule Take 300 mg by mouth 3 (three) times daily.   5  . glimepiride (AMARYL) 4 MG tablet Take 4 mg by mouth daily with breakfast.     . ipratropium-albuterol (DUONEB) 0.5-2.5 (3) MG/3ML SOLN Take 3 mLs by nebulization 2 (two) times daily.     Marland Kitchen levothyroxine (SYNTHROID, LEVOTHROID) 50 MCG tablet Take 50 mcg by mouth daily before breakfast.    . linaclotide (LINZESS) 72 MCG capsule Take 72 mcg by mouth daily as needed (constipation).    Marland Kitchen loratadine (CLARITIN) 10 MG tablet Take 10 mg by mouth at bedtime.     . metFORMIN (GLUCOPHAGE) 500 MG tablet Take 500 mg by mouth 2 (two) times daily with a meal.     . metoprolol (TOPROL-XL) 200 MG 24 hr tablet Take 200 mg by mouth daily after breakfast.     . montelukast (SINGULAIR) 10 MG tablet Take 10 mg by mouth at bedtime.     . Multiple Vitamins-Minerals (CENTRUM SILVER ULTRA WOMENS) TABS Take 1 tablet by mouth daily.    Marland Kitchen  neomycin-polymyxin-hydrocortisone (CORTISPORIN) OTIC solution Apply twice daily to left 1st toenail 10 mL 0  . omeprazole (PRILOSEC) 20 MG capsule Take 20 mg by mouth at bedtime.     . Potassium Chloride ER 20 MEQ TBCR TAKE 1 TABLET BY MOUTH EVERY DAY 90 tablet 1  . Red Yeast Rice 600 MG TABS Take 2 tablets by mouth daily.    Marland Kitchen warfarin (COUMADIN) 1 MG tablet Take 1 mg by mouth See admin instructions. Take 6 mg on Sunday, Tuesday, and  Thursday . Take 5 mg on all other days    . warfarin (COUMADIN) 5 MG tablet Take 5 mg by mouth See admin instructions. Take 6 mg on Sunday, Tuesday, and  Thursday . Take 5 mg on all other  days     No current facility-administered medications on file prior to visit.     PLAN -Will follow-up on 05/14/19.   Katha Cabal [facility] Community Care Manager [phone]

## 2019-05-08 DIAGNOSIS — R791 Abnormal coagulation profile: Secondary | ICD-10-CM | POA: Diagnosis not present

## 2019-05-08 DIAGNOSIS — M549 Dorsalgia, unspecified: Secondary | ICD-10-CM | POA: Diagnosis not present

## 2019-05-14 ENCOUNTER — Other Ambulatory Visit: Payer: Self-pay

## 2019-05-14 NOTE — Patient Outreach (Signed)
[facility]) Care Management  [place]  05/14/2019   Shannon Baxter 24-Nov-1960 [id number]  Subjective:  Outreach for completion of telephonic assessment.  Objective:  Encounter Medications:  Outpatient Encounter Medications as of 05/14/2019  Medication Sig Note  . albuterol (PROVENTIL HFA;VENTOLIN HFA) 108 (90 Base) MCG/ACT inhaler Inhale 1-2 puffs into the lungs every 6 (six) hours as needed for wheezing or shortness of breath.   . ALPRAZolam (XANAX) 0.5 MG tablet Take 0.5 mg by mouth 3 (three) times daily as needed for anxiety.    Marland Kitchen amiodarone (PACERONE) 200 MG tablet Take 200 mg by mouth daily after breakfast.    . carvedilol (COREG) 25 MG tablet TAKE 1 TABLET BY MOUTH TWICE A DAY   . cephALEXin (KEFLEX) 500 MG capsule Take 1 capsule (500 mg total) by mouth 3 (three) times daily.   . clopidogrel (PLAVIX) 75 MG tablet Take 75 mg by mouth daily after breakfast.    . Cyanocobalamin (B-12) 500 MCG TABS Take 3 tablets by mouth daily.   Marland Kitchen ENTRESTO 49-51 MG TAKE 1 TABLET BY MOUTH TWICE A DAY   . EPIPEN 2-PAK 0.3 MG/0.3ML SOAJ injection Inject 0.3 mg into the muscle once.    . ezetimibe (ZETIA) 10 MG tablet Take 10 mg by mouth daily.   . fluticasone (FLONASE) 50 MCG/ACT nasal spray Place 2 sprays into both nostrils daily as needed for allergies.    . fluticasone furoate-vilanterol (BREO ELLIPTA) 200-25 MCG/INH AEPB Inhale 1 puff into the lungs daily.   . furosemide (LASIX) 20 MG tablet Take 3 tablets (60 mg total) by mouth 2 (two) times daily.   Marland Kitchen gabapentin (NEURONTIN) 300 MG capsule Take 300 mg by mouth 3 (three) times daily.    Marland Kitchen glimepiride (AMARYL) 4 MG tablet Take 4 mg by mouth daily with breakfast.    . ipratropium-albuterol (DUONEB) 0.5-2.5 (3) MG/3ML SOLN Take 3 mLs by nebulization 2 (two) times daily.    Marland Kitchen levothyroxine (SYNTHROID, LEVOTHROID) 50 MCG tablet Take 50 mcg by mouth daily before breakfast.   . linaclotide (LINZESS) 72 MCG capsule Take 72 mcg by  mouth daily as needed (constipation).   Marland Kitchen loratadine (CLARITIN) 10 MG tablet Take 10 mg by mouth at bedtime.    . metFORMIN (GLUCOPHAGE) 500 MG tablet Take 500 mg by mouth 2 (two) times daily with a meal.    . metoprolol (TOPROL-XL) 200 MG 24 hr tablet Take 200 mg by mouth daily after breakfast.    . montelukast (SINGULAIR) 10 MG tablet Take 10 mg by mouth at bedtime.    . Multiple Vitamins-Minerals (CENTRUM SILVER ULTRA WOMENS) TABS Take 1 tablet by mouth daily.   Marland Kitchen neomycin-polymyxin-hydrocortisone (CORTISPORIN) OTIC solution Apply twice daily to left 1st toenail   . omeprazole (PRILOSEC) 20 MG capsule Take 20 mg by mouth at bedtime.    . Potassium Chloride ER 20 MEQ TBCR TAKE 1 TABLET BY MOUTH EVERY DAY   . Red Yeast Rice 600 MG TABS Take 2 tablets by mouth daily.   Marland Kitchen warfarin (COUMADIN) 1 MG tablet Take 1 mg by mouth See admin instructions. Take 6 mg on Sunday, Tuesday, and  Thursday . Take 5 mg on all other days 05/14/2019: Reports taking a different dose  . warfarin (COUMADIN) 5 MG tablet Take 5 mg by mouth See admin instructions. Take 6 mg on Sunday, Tuesday, and  Thursday . Take 5 mg on all other days 05/14/2019: Reports taking a different dose.   No facility-administered  encounter medications on file as of 05/14/2019.     Functional Status:  In your present state of health, do you have any difficulty performing the following activities: 05/14/2019  Hearing? N  Vision? N  Difficulty concentrating or making decisions? N  Walking or climbing stairs? Y  Dressing or bathing? Y  Doing errands, shopping? Y  Preparing Food and eating ? N  Comment Able to prepare small meals.  Using the Toilet? N  In the past six months, have you accidently leaked urine? Y  Do you have problems with loss of bowel control? N  Managing your Medications? N  Managing your Finances? N  Housekeeping or managing your Housekeeping? Y  Some recent data might be hidden    Fall/Depression Screening: Fall Risk   05/14/2019  Falls in the past year? 1  Number falls in past yr: 1  Injury with Fall? 0   PHQ 2/9 Scores 05/14/2019  PHQ - 2 Score 0    Assessment:  Telephonic assessment complete. Ms. Shannon Baxter reports feeling well today, however she does report a recent fall. She was unable to recall if the fall occurred before or after our conversation last week. Reports falling after standing up too quickly. Denies severe pain, open wounds, increased edema or head injuries. States she spoke with Dr. Veatrice KellsShultz the next day and no further treatment was needed.  Discussed medications and treatment recommendations. She reports continued compliance with medications. She prefers to prepare her medications daily using a pill box. Reports usually monitoring her blood pressure daily. She forgot to check it today. Reports BP on yesterday was 130/80. She attempts to monitor her fasting blood glucose daily. Reports a range of 82mg /dl -158mg /dl over the past week. Morning reading-108mg /dl. She admits to not weighing since last week. She reports difficulty maintaining her balance. She will attempt to monitor daily when the nursing aide is available. If unable to monitor daily she will weigh at least once a week. Agreeable to notifying MD for weight gain greater than 5lbs in a week.  Discussed care management needs. Reports previously receiving assistance 3 days a week. Reports nursing aide is available to assist 7 days a week if needed. Her daughter [name] is also available. Denies concerns regarding nutrition. Declines need for community resource referrals. [facility] CM Care Plan Problem One     Most Recent Value  Care Plan Problem One  Risk for complications d/t multiple chronic diseases.  Role Documenting the Problem One  Care Management Coordinator  Care Plan for Problem One  Active  [facility] Long Term Goal   Over the next 90 days, patient will not experience disease related complications.  [facility] Long Term Goal Start Date  05/14/19   Interventions for Problem One Long Term Goal  Reviewed medications,nutrition, and recommendations for daily FBS, weight and BP monitoring. Provided education regarding parameters and indcations for notifying MD.  [facility] CM Short Term Goal #1   Over the next 30 days patient will continue to take medications as prescribed.  [facility] CM Short Term Goal #1 Start Date  05/14/19  Interventions for Short Term Goal #1  Discussed importance of continued medication compliance. Encouraged to notify MD with concerns regarding medication tolerance. [Denies concerns regarding medication affordability.]  [facility] CM Short Term Goal #2   Over the next 30 days, patient will attend all provider appointments as scheduled.  [facility] CM Short Term Goal #2 Start Date  05/14/19  Interventions for Short Term Goal #2  Reviewed pending appointments. Discussed  transportation needs. Encouraged to attend as scheduled to prevent delays in care. [Declines need for transportation assistance.]  [facility] CM Short Term Goal #3  Over the next 30 days, patient will monitor her BP daily and log readings.  [facility] CM Short Term Goal #3 Start Date  05/14/19  Interventions for Short Tern Goal #3  Discussed BP parameters. Encouraged to monitor daily and maintain BP log.  [facility] CM Short Term Goal #4  Over the next 30 days patient will increase compliance with diet recommendations.  [facility] CM Short Term Goal #4 Start Date  05/14/19  Interventions for Short Term Goal #4  Discussed importance of maintaining heart healthy and diabetic diet.  [Declined need for nutritional assistance.]  [facility] CM Short Term Goal #5   Over the next 30 days patient will attempt to weigh daily and maintain log.  [facility] CM Short Term Goal #5 Start Date  05/14/19  Interventions for Short Term Goal #5  Reviewed CHF weight parameters. Discussed s/sx of CHF exacerbation.     [facility] CM Care Plan Problem Two     Most Recent Value  Care Plan Problem Two  High Risk for Falls  Role Documenting the Problem Two   Care Management Coordinator  Care Plan for Problem Two  Active  Interventions for Problem Two Long Term Goal   Provided education regarding home safety. Discussed fall prevention measures. Encouraged to use safey device when ambulating and request assistance when needed.   [facility] Long Term Goal  Patient will not fall over the next 60 days.  [facility] Long Term Goal Start Date  05/14/19  [facility] CM Short Term Goal #1   Over the next 30 days, patient will use assistive device when ambulating.  [facility] CM Short Term Goal #1 Start Date  05/14/19  Interventions for Short Term Goal #2   Discussed importance of using assistive device to prevent risk of falls.   [facility] CM Short Term Goal #2   Over the next 30 days patient will practice home safety measures.  [facility] CM Short Term Goal #2 Start Date  05/14/19  Interventions for Short Term Goal #2  Encouraged to keep pathways clear and well lit. Encouraged to rise slowely and allow time to adjust. Encouraged to avoid ambulating too quickly.      PLAN -Will follow-up next week.     Katha Cabal [facility] Community Care Manager [phone]

## 2019-05-15 DIAGNOSIS — R791 Abnormal coagulation profile: Secondary | ICD-10-CM | POA: Diagnosis not present

## 2019-05-18 DIAGNOSIS — R791 Abnormal coagulation profile: Secondary | ICD-10-CM | POA: Diagnosis not present

## 2019-05-22 ENCOUNTER — Other Ambulatory Visit: Payer: Self-pay

## 2019-05-22 NOTE — Patient Outreach (Signed)
[facility]) Care Management  05/22/2019  Shannon Baxter September 29, 1961 [id number]    Follow-up outreach with Shannon Baxter. She denies falls since the last outreach and reports doing well. Denies new or urgent concerns.  Current Outpatient Medications on File Prior to Visit  Medication Sig Dispense Refill  . albuterol (PROVENTIL HFA;VENTOLIN HFA) 108 (90 Base) MCG/ACT inhaler Inhale 1-2 puffs into the lungs every 6 (six) hours as needed for wheezing or shortness of breath.    . ALPRAZolam (XANAX) 0.5 MG tablet Take 0.5 mg by mouth 3 (three) times daily as needed for anxiety.   0  . amiodarone (PACERONE) 200 MG tablet Take 200 mg by mouth daily after breakfast.   0  . carvedilol (COREG) 25 MG tablet TAKE 1 TABLET BY MOUTH TWICE A DAY 180 tablet 1  . cephALEXin (KEFLEX) 500 MG capsule Take 1 capsule (500 mg total) by mouth 3 (three) times daily. 21 capsule 0  . clopidogrel (PLAVIX) 75 MG tablet Take 75 mg by mouth daily after breakfast.   6  . Cyanocobalamin (B-12) 500 MCG TABS Take 3 tablets by mouth daily.    Marland Kitchen ENTRESTO 49-51 MG TAKE 1 TABLET BY MOUTH TWICE A DAY 60 tablet 10  . EPIPEN 2-PAK 0.3 MG/0.3ML SOAJ injection Inject 0.3 mg into the muscle once.   0  . ezetimibe (ZETIA) 10 MG tablet Take 10 mg by mouth daily.    . fluticasone (FLONASE) 50 MCG/ACT nasal spray Place 2 sprays into both nostrils daily as needed for allergies.     . fluticasone furoate-vilanterol (BREO ELLIPTA) 200-25 MCG/INH AEPB Inhale 1 puff into the lungs daily.    . furosemide (LASIX) 20 MG tablet Take 3 tablets (60 mg total) by mouth 2 (two) times daily. 180 tablet 1  . gabapentin (NEURONTIN) 300 MG capsule Take 300 mg by mouth 3 (three) times daily.   5  . glimepiride (AMARYL) 4 MG tablet Take 4 mg by mouth daily with breakfast.     . ipratropium-albuterol (DUONEB) 0.5-2.5 (3) MG/3ML SOLN Take 3 mLs by nebulization 2 (two) times daily.     Marland Kitchen levothyroxine (SYNTHROID, LEVOTHROID) 50 MCG tablet Take 50 mcg  by mouth daily before breakfast.    . linaclotide (LINZESS) 72 MCG capsule Take 72 mcg by mouth daily as needed (constipation).    Marland Kitchen loratadine (CLARITIN) 10 MG tablet Take 10 mg by mouth at bedtime.     . metFORMIN (GLUCOPHAGE) 500 MG tablet Take 500 mg by mouth 2 (two) times daily with a meal.     . metoprolol (TOPROL-XL) 200 MG 24 hr tablet Take 200 mg by mouth daily after breakfast.     . montelukast (SINGULAIR) 10 MG tablet Take 10 mg by mouth at bedtime.     . Multiple Vitamins-Minerals (CENTRUM SILVER ULTRA WOMENS) TABS Take 1 tablet by mouth daily.    Marland Kitchen neomycin-polymyxin-hydrocortisone (CORTISPORIN) OTIC solution Apply twice daily to left 1st toenail 10 mL 0  . omeprazole (PRILOSEC) 20 MG capsule Take 20 mg by mouth at bedtime.     . Potassium Chloride ER 20 MEQ TBCR TAKE 1 TABLET BY MOUTH EVERY DAY 90 tablet 1  . Red Yeast Rice 600 MG TABS Take 2 tablets by mouth daily.    Marland Kitchen warfarin (COUMADIN) 1 MG tablet Take 1 mg by mouth See admin instructions. Take 6 mg on Sunday, Tuesday, and  Thursday . Take 5 mg on all other days    . warfarin (COUMADIN) 5  MG tablet Take 5 mg by mouth See admin instructions. Take 6 mg on Sunday, Tuesday, and  Thursday . Take 5 mg on all other days     No current facility-administered medications on file prior to visit.       PLAN -Will continue routine outreach.   [place] [phone]

## 2019-05-25 DIAGNOSIS — R791 Abnormal coagulation profile: Secondary | ICD-10-CM | POA: Diagnosis not present

## 2019-06-01 DIAGNOSIS — R791 Abnormal coagulation profile: Secondary | ICD-10-CM | POA: Diagnosis not present

## 2019-06-01 DIAGNOSIS — G4733 Obstructive sleep apnea (adult) (pediatric): Secondary | ICD-10-CM | POA: Diagnosis not present

## 2019-06-01 DIAGNOSIS — J4530 Mild persistent asthma, uncomplicated: Secondary | ICD-10-CM | POA: Diagnosis not present

## 2019-06-01 DIAGNOSIS — J301 Allergic rhinitis due to pollen: Secondary | ICD-10-CM | POA: Diagnosis not present

## 2019-06-02 ENCOUNTER — Ambulatory Visit (INDEPENDENT_AMBULATORY_CARE_PROVIDER_SITE_OTHER): Payer: Medicare Other | Admitting: *Deleted

## 2019-06-02 DIAGNOSIS — I420 Dilated cardiomyopathy: Secondary | ICD-10-CM | POA: Diagnosis not present

## 2019-06-03 LAB — CUP PACEART REMOTE DEVICE CHECK
Battery Remaining Longevity: 107 mo
Battery Voltage: 3 V
Brady Statistic AP VP Percent: 0.01 %
Brady Statistic AP VS Percent: 3.97 %
Brady Statistic AS VP Percent: 0.04 %
Brady Statistic AS VS Percent: 95.98 %
Brady Statistic RA Percent Paced: 3.96 %
Brady Statistic RV Percent Paced: 0.04 %
Date Time Interrogation Session: 20200811062723
HighPow Impedance: 77 Ohm
Implantable Lead Implant Date: 20180102
Implantable Lead Implant Date: 20180102
Implantable Lead Location: 753859
Implantable Lead Location: 753860
Implantable Lead Model: 5076
Implantable Pulse Generator Implant Date: 20180102
Lead Channel Impedance Value: 399 Ohm
Lead Channel Impedance Value: 494 Ohm
Lead Channel Impedance Value: 513 Ohm
Lead Channel Pacing Threshold Amplitude: 0.375 V
Lead Channel Pacing Threshold Amplitude: 1 V
Lead Channel Pacing Threshold Pulse Width: 0.4 ms
Lead Channel Pacing Threshold Pulse Width: 0.4 ms
Lead Channel Sensing Intrinsic Amplitude: 2.75 mV
Lead Channel Sensing Intrinsic Amplitude: 2.75 mV
Lead Channel Sensing Intrinsic Amplitude: 31.125 mV
Lead Channel Sensing Intrinsic Amplitude: 31.125 mV
Lead Channel Setting Pacing Amplitude: 1.5 V
Lead Channel Setting Pacing Amplitude: 2 V
Lead Channel Setting Pacing Pulse Width: 0.4 ms
Lead Channel Setting Sensing Sensitivity: 0.3 mV

## 2019-06-05 ENCOUNTER — Ambulatory Visit: Payer: Medicare Other | Admitting: Sports Medicine

## 2019-06-08 ENCOUNTER — Other Ambulatory Visit: Payer: Self-pay

## 2019-06-08 DIAGNOSIS — R791 Abnormal coagulation profile: Secondary | ICD-10-CM | POA: Diagnosis not present

## 2019-06-08 DIAGNOSIS — Z1231 Encounter for screening mammogram for malignant neoplasm of breast: Secondary | ICD-10-CM | POA: Diagnosis not present

## 2019-06-08 NOTE — Patient Outreach (Signed)
[facility]) Care Management  06/08/2019  Valentine Kuechle 12/24/1960 [id number]    Outreach with Ms. Miskell. She reports feeling well. Reports being at a medical facility preparing for a mammogram at the time of the call. She is agreeable to follow-up outreach later this week.  PLAN -Will follow-up later this week.  [place] Care Management [phone]

## 2019-06-10 ENCOUNTER — Encounter: Payer: Self-pay | Admitting: Cardiology

## 2019-06-10 NOTE — Progress Notes (Signed)
Remote ICD transmission.   

## 2019-06-11 ENCOUNTER — Ambulatory Visit: Payer: Self-pay

## 2019-06-15 DIAGNOSIS — M1711 Unilateral primary osteoarthritis, right knee: Secondary | ICD-10-CM | POA: Diagnosis not present

## 2019-06-15 DIAGNOSIS — R791 Abnormal coagulation profile: Secondary | ICD-10-CM | POA: Diagnosis not present

## 2019-06-22 DIAGNOSIS — R791 Abnormal coagulation profile: Secondary | ICD-10-CM | POA: Diagnosis not present

## 2019-06-24 ENCOUNTER — Ambulatory Visit: Payer: Medicare Other | Admitting: Cardiology

## 2019-06-24 ENCOUNTER — Ambulatory Visit (INDEPENDENT_AMBULATORY_CARE_PROVIDER_SITE_OTHER): Payer: Medicare Other | Admitting: Cardiology

## 2019-06-24 ENCOUNTER — Other Ambulatory Visit: Payer: Self-pay

## 2019-06-24 ENCOUNTER — Encounter: Payer: Self-pay | Admitting: Cardiology

## 2019-06-24 VITALS — BP 114/78 | HR 71 | Ht 70.0 in | Wt 339.0 lb

## 2019-06-24 DIAGNOSIS — I480 Paroxysmal atrial fibrillation: Secondary | ICD-10-CM | POA: Diagnosis not present

## 2019-06-24 DIAGNOSIS — I060 Rheumatic aortic stenosis: Secondary | ICD-10-CM | POA: Diagnosis not present

## 2019-06-24 DIAGNOSIS — E785 Hyperlipidemia, unspecified: Secondary | ICD-10-CM

## 2019-06-24 DIAGNOSIS — Z952 Presence of prosthetic heart valve: Secondary | ICD-10-CM | POA: Diagnosis not present

## 2019-06-24 DIAGNOSIS — I420 Dilated cardiomyopathy: Secondary | ICD-10-CM

## 2019-06-24 NOTE — Progress Notes (Signed)
Cardiology Office Note:    Date:  06/24/2019   ID:  Shannon Baxter, DOB 06/05/1961, MRN [id number]  PCP:  Nicoletta Dress, MD  Cardiologist:  Jenne Campus, MD    Referring MD: Nicoletta Dress, MD   Chief Complaint  Patient presents with  . Follow-up  . Pre-op Exam    For Colonoscopy Dr. Lyda Jester  Doing well  History of Present Illness:    Shannon Baxter is a 58 y.o. female  With complex past medical history which include cardiomyopathy ejection fraction 30 to 35%, status post aortic valve replacement, coronary artery disease, diabetes.  Aortic stenosis her last assessment was done in December of last year which showed moderate aortic stenosis status post aortic valve replacement.  Comes today to my office for follow-up overall she is doing well denies having any tightness squeezing pressure burning chest she does have some shortness of breath.  She asked me if she can go to gym and start exercises until before make this decision I want her to have an echocardiogram to recheck left ventricular ejection fraction more importantly look at the aortic valve size.  Looking at [facility] and that her defibrillator interrogation showed normal OptiVol however she gained few pounds and I suspect this is related to lack of exercise.  Past Medical History:  Diagnosis Date  . Allergy   . Aortic insufficiency   . Aortic stenosis   . Arthritis   . Asthma   . CHF (congestive heart failure) ([place])   . Claudication ([place])   . Coronary artery disease   . Diabetes mellitus without complication ([place])   . GERD (gastroesophageal reflux disease)   . Heart murmur   . Hyperlipidemia   . Hypertension   . Nutritional and metabolic cardiomyopathy ([place])   . PVD (peripheral vascular disease) ([place])   . Sleep apnea   . Stroke [facility])     Past Surgical History:  Procedure Laterality Date  . A-FLUTTER ABLATION N/A 11/05/2018   Procedure: A-FLUTTER ABLATION;  Surgeon: Constance Haw, MD;  Location: [facility] [place] [facility];  Service: Cardiovascular;  Laterality: N/A;  . CARDIAC CATHETERIZATION    . INSERT / REPLACE / [place] ICD  . MECHANICAL AORTIC VALVE REPLACEMENT    . PERIPHERAL ARTERIAL STENT GRAFT    . TUBAL LIGATION      Current Medications: Current Meds  Medication Sig  . albuterol (PROVENTIL HFA;VENTOLIN HFA) 108 (90 Base) MCG/ACT inhaler Inhale 1-2 puffs into the lungs every 6 (six) hours as needed for wheezing or shortness of breath.  . ALPRAZolam (XANAX) 0.5 MG tablet Take 0.5 mg by mouth 3 (three) times daily as needed for anxiety.   Marland Kitchen amiodarone (PACERONE) 200 MG tablet Take 200 mg by mouth daily after breakfast.   . carvedilol (COREG) 25 MG tablet TAKE 1 TABLET BY MOUTH TWICE A DAY  . clopidogrel (PLAVIX) 75 MG tablet Take 75 mg by mouth daily after breakfast.   . Cyanocobalamin (B-12) 500 MCG TABS Take 3 tablets by mouth daily.  Marland Kitchen ENTRESTO 49-51 MG TAKE 1 TABLET BY MOUTH TWICE A DAY  . EPIPEN 2-PAK 0.3 MG/0.3ML SOAJ injection Inject 0.3 mg into the muscle once.   . ezetimibe (ZETIA) 10 MG tablet Take 10 mg by mouth daily.  . fluticasone (FLONASE) 50 MCG/ACT nasal spray Place 2 sprays into both nostrils daily as needed for allergies.   . fluticasone furoate-vilanterol (BREO ELLIPTA) 200-25 MCG/INH AEPB Inhale 1 puff into  the lungs daily.  . furosemide (LASIX) 20 MG tablet Take 3 tablets (60 mg total) by mouth 2 (two) times daily.  Marland Kitchen gabapentin (NEURONTIN) 300 MG capsule Take 300 mg by mouth 3 (three) times daily.   Marland Kitchen glimepiride (AMARYL) 4 MG tablet Take 4 mg by mouth daily with breakfast.   . ipratropium-albuterol (DUONEB) 0.5-2.5 (3) MG/3ML SOLN Take 3 mLs by nebulization 2 (two) times daily.   Marland Kitchen levothyroxine (SYNTHROID, LEVOTHROID) 50 MCG tablet Take 50 mcg by mouth daily before breakfast.  . linaclotide (LINZESS) 72 MCG capsule Take 72 mcg by mouth daily as needed (constipation).  Marland Kitchen loratadine (CLARITIN) 10 MG tablet Take 10 mg by mouth at bedtime.    . metFORMIN (GLUCOPHAGE) 500 MG tablet Take 500 mg by mouth 2 (two) times daily with a meal.   . metoprolol (TOPROL-XL) 200 MG 24 hr tablet Take 200 mg by mouth daily after breakfast.   . montelukast (SINGULAIR) 10 MG tablet Take 10 mg by mouth at bedtime.   . Multiple Vitamins-Minerals (CENTRUM SILVER ULTRA WOMENS) TABS Take 1 tablet by mouth daily.  Marland Kitchen neomycin-polymyxin-hydrocortisone (CORTISPORIN) OTIC solution Apply twice daily to left 1st toenail  . omeprazole (PRILOSEC) 20 MG capsule Take 20 mg by mouth at bedtime.   . Potassium Chloride ER 20 MEQ TBCR TAKE 1 TABLET BY MOUTH EVERY DAY  . Red Yeast Rice 600 MG TABS Take 2 tablets by mouth daily.  Marland Kitchen warfarin (COUMADIN) 1 MG tablet Take 1 mg by mouth See admin instructions. Take 6 mg on Sunday, Tuesday, and  Thursday . Take 5 mg on all other days  . warfarin (COUMADIN) 5 MG tablet Take 5 mg by mouth See admin instructions. Take 6 mg on Sunday, Tuesday, and  Thursday . Take 5 mg on all other days     Allergies:   Iodinated diagnostic agents and Ioxaglate   Social History   Socioeconomic History  . Marital status: Single    Spouse name: Not on file  . Number of children: Not on file  . Years of education: Not on file  . Highest education level: Not on file  Occupational History  . Not on file  Social Needs  . Financial resource strain: Not on file  . Food insecurity    Worry: Not on file    Inability: Not on file  . Transportation needs    Medical: No    Non-medical: No  Tobacco Use  . Smoking status: Former Games developer  . Smokeless tobacco: Never Used  Substance and Sexual Activity  . Alcohol use: No  . Drug use: No  . Sexual activity: Not on file  Lifestyle  . Physical activity    Days per week: Not on file    Minutes per session: Not on file  . Stress: Not on file  Relationships  . Social Musician on phone: Not on file    Gets together: Not on file    Attends religious service: Not on file    Active  member of club or organization: Not on file    Attends meetings of clubs or organizations: Not on file    Relationship status: Not on file  Other Topics Concern  . Not on file  Social History Narrative  . Not on file     Family History: The patient's family history includes Heart disease in her brother. ROS:   Please see the history of present illness.    All 14 point  review of systems negative except as described per history of present illness  EKGs/Labs/Other Studies Reviewed:      Recent Labs: 11/05/2018: Hemoglobin 11.8; Platelets 241 11/24/2018: NT-Pro BNP 668 12/29/2018: BUN 43; Creatinine, Ser 1.40; Magnesium 2.1; Potassium 4.2; Sodium 141  Recent Lipid Panel    Component Value Date/Time   CHOL  07/04/2007 0815    200        ATP III CLASSIFICATION:  <200     mg/dL   Desirable  [id number]  mg/dL   Borderline High  >=045>=240    mg/dL   High   TRIG 409139 81/19/147809/09/2007 0815   HDL 47 07/04/2007 0815   CHOLHDL 4.3 07/04/2007 0815   VLDL 28 07/04/2007 0815   LDLCALC (H) 07/04/2007 0815    125        Total Cholesterol/HDL:CHD Risk Coronary Heart Disease Risk Table                     Men   Women  1/2 Average Risk   3.4   3.3    Physical Exam:    VS:  BP 114/78   Pulse 71   Ht 5\' 10"  (1.778 m)   Wt (!) 339 lb (153.8 kg)   SpO2 98%   BMI 48.64 kg/m     Wt Readings from Last 3 Encounters:  06/24/19 (!) 339 lb (153.8 kg)  02/23/19 (!) 321 lb (145.6 kg)  12/29/18 (!) 304 lb 12.8 oz (138.3 kg)     GEN:  Well nourished, well developed in no acute distress HEENT: Normal NECK: No JVD; No carotid bruits LYMPHATICS: No lymphadenopathy CARDIAC: RRR, systolic ejection murmur grade 2/6 to 3/6 best heard at right upper portion of the sternum no rubs, no gallops RESPIRATORY:  Clear to auscultation without rales, wheezing or rhonchi  ABDOMEN: Soft, non-tender, non-distended MUSCULOSKELETAL:  No edema; No deformity  SKIN: Warm and dry LOWER EXTREMITIES: no swelling NEUROLOGIC:   Alert and oriented x 3 PSYCHIATRIC:  Normal affect   ASSESSMENT:    No diagnosis found. PLAN:    In order of problems listed above:  1. Coronary artery disease stable denies have any issue from that aspect. 2. Dyslipidemia intolerant to statin with LDL of 154.  She is only on Zetia she will be referred to lipid clinic. 3. Status post aortic valve replacement last assessment of the valve last year showing moderate stenosis we will redo echocardiogram 4. Cardiomyopathy ejection fraction 30 to 35% on appropriate medications which I will continue. 5. ICD present followed by our EP clinic.   Medication Adjustments/Labs and Tests Ordered: Current medicines are reviewed at length with the patient today.  Concerns regarding medicines are outlined above.  No orders of the defined types were placed in this encounter.  Medication changes: No orders of the defined types were placed in this encounter.   Signed, Georgeanna Leaobert J. Daqwan Dougal, MD, [facility] Center[facility]ACC 06/24/2019 3:28 PM    [facility]

## 2019-06-24 NOTE — Patient Instructions (Signed)
Medication Instructions:  Your physician recommends that you continue on your current medications as directed. Please refer to the Current Medication list given to you today.  If you need a refill on your cardiac medications before your next appointment, please call your pharmacy.   Lab work: Your physician recommends that you return for lab work today: bmp   If you have labs (blood work) drawn today and your tests are completely normal, you will receive your results only by: Marland Kitchen MyChart Message (if you have MyChart) OR . A paper copy in the mail If you have any lab test that is abnormal or we need to change your treatment, we will call you to review the results.  Testing/Procedures: Your physician has requested that you have an echocardiogram. Echocardiography is a painless test that uses sound waves to create images of your heart. It provides your doctor with information about the size and shape of your heart and how well your heart's chambers and valves are working. This procedure takes approximately one hour. There are no restrictions for this procedure.    Follow-Up: At [facility], you and your health needs are our priority.  As part of our continuing mission to provide you with exceptional heart care, we have created designated Provider Care Teams.  These Care Teams include your primary Cardiologist (physician) and Advanced Practice Providers (APPs -  Physician Assistants and Nurse Practitioners) who all work together to provide you with the care you need, when you need it. You will need a follow up appointment in 2 months.  Please call our office 2 months in advance to schedule this appointment.  You may see Gypsy Balsam, MD or another member of our [facility] Provider Team in [place]: Norman Herrlich, MD . Belva Crome, MD  Any Other Special Instructions Will Be Listed Below (If Applicable).  Dr. Bing Matter has referred you to the lipid clinic. Their office should call you  within 1 week. If not please call our office.  Echocardiogram An echocardiogram is a procedure that uses painless sound waves (ultrasound) to produce an image of the heart. Images from an echocardiogram can provide important information about:  Signs of coronary artery disease (CAD).  Aneurysm detection. An aneurysm is a weak or damaged part of an artery wall that bulges out from the normal force of blood pumping through the body.  Heart size and shape. Changes in the size or shape of the heart can be associated with certain conditions, including heart failure, aneurysm, and CAD.  Heart muscle function.  Heart valve function.  Signs of a past heart attack.  Fluid buildup around the heart.  Thickening of the heart muscle.  A tumor or infectious growth around the heart valves. Tell a health care provider about:  Any allergies you have.  All medicines you are taking, including vitamins, herbs, eye drops, creams, and over-the-counter medicines.  Any blood disorders you have.  Any surgeries you have had.  Any medical conditions you have.  Whether you are pregnant or may be pregnant. What are the risks? Generally, this is a safe procedure. However, problems may occur, including:  Allergic reaction to dye (contrast) that may be used during the procedure. What happens before the procedure? No specific preparation is needed. You may eat and drink normally. What happens during the procedure?   An IV tube may be inserted into one of your veins.  You may receive contrast through this tube. A contrast is an injection that improves the quality  of the pictures from your heart.  A gel will be applied to your chest.  A wand-like tool (transducer) will be moved over your chest. The gel will help to transmit the sound waves from the transducer.  The sound waves will harmlessly bounce off of your heart to allow the heart images to be captured in real-time motion. The images will be  recorded on a computer. The procedure may vary among health care providers and hospitals. What happens after the procedure?  You may return to your normal, everyday life, including diet, activities, and medicines, unless your health care provider tells you not to do that. Summary  An echocardiogram is a procedure that uses painless sound waves (ultrasound) to produce an image of the heart.  Images from an echocardiogram can provide important information about the size and shape of your heart, heart muscle function, heart valve function, and fluid buildup around your heart.  You do not need to do anything to prepare before this procedure. You may eat and drink normally.  After the echocardiogram is completed, you may return to your normal, everyday life, unless your health care provider tells you not to do that. This information is not intended to replace advice given to you by your health care provider. Make sure you discuss any questions you have with your health care provider. Document Released: 10/05/2000 Document Revised: 01/29/2019 Document Reviewed: 11/10/2016 Elsevier Patient Education  2020 [employer].

## 2019-06-25 ENCOUNTER — Other Ambulatory Visit: Payer: Self-pay

## 2019-06-25 LAB — BASIC METABOLIC PANEL
BUN/Creatinine Ratio: 22 (ref 9–23)
BUN: 24 mg/dL (ref 6–24)
CO2: 27 mmol/L (ref 20–29)
Calcium: 8.9 mg/dL (ref 8.7–10.2)
Chloride: 105 mmol/L (ref 96–106)
Creatinine, Ser: 1.1 mg/dL — ABNORMAL HIGH (ref 0.57–1.00)
GFR calc Af Amer: 64 mL/min/{1.73_m2} (ref >59)
GFR calc non Af Amer: 56 mL/min/{1.73_m2} — ABNORMAL LOW (ref >59)
Glucose: 61 mg/dL — ABNORMAL LOW (ref 65–99)
Potassium: 4.3 mmol/L (ref 3.5–5.2)
Sodium: 144 mmol/L (ref 134–144)

## 2019-06-25 NOTE — Patient Outreach (Signed)
[place] [facility]) Care Management  06/25/2019  Shannon Baxter October 05, 1961 [id number]    Unsuccessful outreach attempt. Left HIPAA compliant voice message requesting a return call.    PLAN -Will follow-up within 3-4 business days.   [place] Care Management [phone]

## 2019-06-30 ENCOUNTER — Telehealth: Payer: Self-pay | Admitting: Cardiology

## 2019-06-30 NOTE — Telephone Encounter (Signed)
Patient informed that she was referred to the lipid clinic and they will set this up for her. She verbally understood. She informed me she missed a called from [facility] and is going to call them back to see if it was them. She verbally understands all information. No further questions.

## 2019-06-30 NOTE — Telephone Encounter (Signed)
Pt states she is supposed to have a cholesterol shot?

## 2019-07-01 ENCOUNTER — Ambulatory Visit (INDEPENDENT_AMBULATORY_CARE_PROVIDER_SITE_OTHER): Payer: Medicare Other | Admitting: Sports Medicine

## 2019-07-01 ENCOUNTER — Other Ambulatory Visit: Payer: Self-pay

## 2019-07-01 ENCOUNTER — Encounter: Payer: Self-pay | Admitting: Sports Medicine

## 2019-07-01 DIAGNOSIS — M79675 Pain in left toe(s): Secondary | ICD-10-CM | POA: Diagnosis not present

## 2019-07-01 DIAGNOSIS — IMO0002 Reserved for concepts with insufficient information to code with codable children: Secondary | ICD-10-CM

## 2019-07-01 DIAGNOSIS — E1165 Type 2 diabetes mellitus with hyperglycemia: Secondary | ICD-10-CM | POA: Diagnosis not present

## 2019-07-01 DIAGNOSIS — B351 Tinea unguium: Secondary | ICD-10-CM | POA: Diagnosis not present

## 2019-07-01 DIAGNOSIS — M79674 Pain in right toe(s): Secondary | ICD-10-CM | POA: Diagnosis not present

## 2019-07-01 DIAGNOSIS — E1140 Type 2 diabetes mellitus with diabetic neuropathy, unspecified: Secondary | ICD-10-CM | POA: Diagnosis not present

## 2019-07-01 NOTE — Progress Notes (Signed)
Subjective: Shannon Baxter is a 58 y.o. female patient with history of diabetes who presents to office today complaining of long, painful nails while ambulating in shoes; unable to trim. Patient states that the glucose reading this morning was 125 mg/dl. Saw PCP Dr. Tomasa BlaseSchultz 1 month ago, Last A1c 6.1. Patient denies any new changes in medication or new problems.   Patient Active Problem List   Diagnosis Date Noted  . Paroxysmal atrial flutter (HCC) 09/01/2018  . Chronic systolic congestive heart failure (HCC) 12/06/2016  . Morbid obesity (HCC) 12/06/2016  . Presence of automatic implantable cardioverter-defibrillator 10/23/2016  . Dilated cardiomyopathy (HCC) 09/03/2016  . Status post aortic valve replacement 03/29/2016  . Paroxysmal atrial fibrillation (HCC) 03/06/2016  . Rheumatic aortic valve insufficiency 03/05/2016  . Acute on chronic systolic heart failure (HCC) 02/21/2016  . Hypothyroidism 01/23/2016  . Aortic valve stenosis, rheumatic 11/22/2015  . Type 2 diabetes mellitus with diabetic peripheral angiopathy without gangrene, without long-term current use of insulin (HCC) 11/22/2015  . Dyspnea on exertion 10/26/2015  . Atherosclerosis of native arteries of extremities with intermittent claudication, bilateral legs (HCC) 08/04/2015  . Carotid atherosclerosis 07/26/2015  . Coronary artery disease involving native coronary artery of native heart without angina pectoris 07/26/2015  . Dyslipidemia 07/26/2015  . Essential hypertension 07/26/2015  . Intermittent claudication (HCC) 07/26/2015  . Peripheral vascular disease (HCC) 07/26/2015  . History of tobacco abuse 07/26/2015   Current Outpatient Medications on File Prior to Visit  Medication Sig Dispense Refill  . albuterol (PROVENTIL HFA;VENTOLIN HFA) 108 (90 Base) MCG/ACT inhaler Inhale 1-2 puffs into the lungs every 6 (six) hours as needed for wheezing or shortness of breath.    . ALPRAZolam (XANAX) 0.5 MG tablet Take 0.5 mg by mouth  3 (three) times daily as needed for anxiety.   0  . amiodarone (PACERONE) 200 MG tablet Take 200 mg by mouth daily after breakfast.   0  . carvedilol (COREG) 25 MG tablet TAKE 1 TABLET BY MOUTH TWICE A DAY 180 tablet 1  . clopidogrel (PLAVIX) 75 MG tablet Take 75 mg by mouth daily after breakfast.   6  . Cyanocobalamin (B-12) 500 MCG TABS Take 3 tablets by mouth daily.    Marland Kitchen. ENTRESTO 49-51 MG TAKE 1 TABLET BY MOUTH TWICE A DAY 60 tablet 10  . EPIPEN 2-PAK 0.3 MG/0.3ML SOAJ injection Inject 0.3 mg into the muscle once.   0  . ezetimibe (ZETIA) 10 MG tablet Take 10 mg by mouth daily.    . fluticasone (FLONASE) 50 MCG/ACT nasal spray Place 2 sprays into both nostrils daily as needed for allergies.     . fluticasone furoate-vilanterol (BREO ELLIPTA) 200-25 MCG/INH AEPB Inhale 1 puff into the lungs daily.    . furosemide (LASIX) 20 MG tablet Take 3 tablets (60 mg total) by mouth 2 (two) times daily. 180 tablet 1  . gabapentin (NEURONTIN) 300 MG capsule Take 300 mg by mouth 3 (three) times daily.   5  . glimepiride (AMARYL) 4 MG tablet Take 4 mg by mouth daily with breakfast.     . ipratropium-albuterol (DUONEB) 0.5-2.5 (3) MG/3ML SOLN Take 3 mLs by nebulization 2 (two) times daily.     Marland Kitchen. levothyroxine (SYNTHROID, LEVOTHROID) 50 MCG tablet Take 50 mcg by mouth daily before breakfast.    . linaclotide (LINZESS) 72 MCG capsule Take 72 mcg by mouth daily as needed (constipation).    Marland Kitchen. loratadine (CLARITIN) 10 MG tablet Take 10 mg by mouth at bedtime.     .Marland Kitchen  metFORMIN (GLUCOPHAGE) 500 MG tablet Take 500 mg by mouth 2 (two) times daily with a meal.     . metoprolol (TOPROL-XL) 200 MG 24 hr tablet Take 200 mg by mouth daily after breakfast.     . montelukast (SINGULAIR) 10 MG tablet Take 10 mg by mouth at bedtime.     . Multiple Vitamins-Minerals (CENTRUM SILVER ULTRA WOMENS) TABS Take 1 tablet by mouth daily.    Marland Kitchen neomycin-polymyxin-hydrocortisone (CORTISPORIN) OTIC solution Apply twice daily to left 1st  toenail 10 mL 0  . omeprazole (PRILOSEC) 20 MG capsule Take 20 mg by mouth at bedtime.     . Potassium Chloride ER 20 MEQ TBCR TAKE 1 TABLET BY MOUTH EVERY DAY 90 tablet 1  . Red Yeast Rice 600 MG TABS Take 2 tablets by mouth daily.    Marland Kitchen warfarin (COUMADIN) 1 MG tablet Take 1 mg by mouth See admin instructions. Take 6 mg on Sunday, Tuesday, and  Thursday . Take 5 mg on all other days    . warfarin (COUMADIN) 5 MG tablet Take 5 mg by mouth See admin instructions. Take 6 mg on Sunday, Tuesday, and  Thursday . Take 5 mg on all other days     No current facility-administered medications on file prior to visit.    Allergies  Allergen Reactions  . Iodinated Diagnostic Agents Hives and Rash  . Ioxaglate Hives    Recent Results (from the past 2160 hour(s))  CUP PACEART REMOTE DEVICE CHECK     Status: None   Collection Time: 06/02/19  6:27 AM  Result Value Ref Range   Date Time Interrogation Session [id number]    Pulse Generator Manufacturer MERM    Pulse Gen Model [id number] Evera MRI XT DR    Pulse Gen Serial Number [id number]    Clinic Name [place] Pulse Generator Type Implantable Cardiac Defibulator    Implantable Pulse Generator Implant Date [id number]    Implantable Lead Manufacturer MERM    Implantable Lead Model 5076 CapSureFix Novus MRI SureScan    Implantable Lead Serial Number [id number]    Implantable Lead Implant Date [id number]    Implantable Lead Location Detail 1 UNKNOWN    Implantable Lead Location [id number]    Implantable Lead Manufacturer [facility]    Implantable Lead Model 949 [id number] Sprint Quattro Secure S MRI SureScan    Implantable Lead Serial Number [id number]    Implantable Lead Implant Date [id number]    Implantable Lead Location Detail 1 UNKNOWN    Implantable Lead Location [id number]    Lead Channel Setting Sensing Sensitivity 0.3 mV   Lead Channel Setting Pacing Amplitude 1.5 V   Lead Channel Setting Pacing Pulse Width 0.4 ms   Lead Channel Setting Pacing  Amplitude 2 V   Lead Channel Impedance Value 513 ohm   Lead Channel Sensing Intrinsic Amplitude 2.75 mV   Lead Channel Sensing Intrinsic Amplitude 2.75 mV   Lead Channel Pacing Threshold Amplitude 0.375 V   Lead Channel Pacing Threshold Pulse Width 0.4 ms   Lead Channel Impedance Value 494 ohm   Lead Channel Impedance Value 399 ohm   Lead Channel Sensing Intrinsic Amplitude 31.125 mV   Lead Channel Sensing Intrinsic Amplitude 31.125 mV   Lead Channel Pacing Threshold Amplitude 1 V   Lead Channel Pacing Threshold Pulse Width 0.4 ms   HighPow Impedance 77 ohm   Battery Status OK    Battery Remaining Longevity 107 mo   Battery Voltage 3.00 V  Brady Statistic RA Percent Paced 3.96 %   Brady Statistic RV Percent Paced 0.04 %   Brady Statistic AP VP Percent 0.01 %   Brady Statistic AS VP Percent 0.04 %   Brady Statistic AP VS Percent 3.97 %   Brady Statistic AS VS Percent 95.98 %  Basic metabolic panel     Status: Abnormal   Collection Time: 06/24/19  3:40 PM  Result Value Ref Range   Glucose 61 (L) 65 - 99 mg/dL   BUN 24 6 - 24 mg/dL   Creatinine, Ser 1.69 (H) 0.57 - 1.00 mg/dL   GFR calc non Af Amer 56 (L) >59 mL/min/1.73   GFR calc Af Amer 64 >59 mL/min/1.73   BUN/Creatinine Ratio 22 9 - 23   Sodium 144 134 - 144 mmol/L   Potassium 4.3 3.5 - 5.2 mmol/L   Chloride 105 96 - 106 mmol/L   CO2 27 20 - 29 mmol/L   Calcium 8.9 8.7 - 10.2 mg/dL    Objective: General: Patient is awake, alert, and oriented x 3 and in no acute distress.  Integument: Skin is warm, dry and supple bilateral. Nails are tender, long, thickened and dystrophic with subungual debris, consistent with onychomycosis, 1-5 bilateral. Left hallux nail appears to be growing out.  No signs of infection. No open lesions or preulcerative lesions present bilateral. Remaining integument unremarkable.  Vasculature:  Dorsalis Pedis pulse 1/4 bilateral. Posterior Tibial pulse  0/4 bilateral. Capillary fill time <3 sec 1-5  bilateral. Positive hair growth to the level of the digits.Temperature gradient within normal limits. No varicosities present bilateral. No edema present bilateral.   Neurology: The patient has intact sensation measured with a 5.07/10g Semmes Weinstein Monofilament at all pedal sites bilateral. Vibratory sensation diminished bilateral with tuning fork. No Babinski sign present bilateral.   Musculoskeletal:Asymptomatic pes planus pedal deformities noted bilateral. Muscular strength 5/5 in all lower extremity muscular groups bilateral. No tenderness with calf compression bilateral.  Assessment and Plan: Problem List Items Addressed This Visit    None    Visit Diagnoses    Pain due to onychomycosis of toenails of both feet    -  Primary   Type 2 diabetes, uncontrolled, with neuropathy (HCC)         -Examined patient. -Discussed and educated patient on diabetic foot care, especially with  regards to the vascular, neurological and musculoskeletal systems.  -Mechanically debrided all nails 1-5 bilateral using sterile nail nipper and filed with dremel. -Patient to return  in 3 months for at risk foot care -Patient advised to call the office if any problems or questions arise in the meantime.  [name], DPM

## 2019-07-02 ENCOUNTER — Other Ambulatory Visit: Payer: Self-pay

## 2019-07-02 DIAGNOSIS — R791 Abnormal coagulation profile: Secondary | ICD-10-CM | POA: Diagnosis not present

## 2019-07-02 NOTE — Patient Outreach (Signed)
[facility]) Care Management  07/02/2019  Shannon Baxter [birth year] [id number]    Brief outreach with Ms. Schindel. Reports feeling well but attempting to rest at the time of the call. Denies complaints of shortness of breath or chest discomfort. Reports episodes of mild foot and toenail pain. Reports completing clinic visit with her Podiatrist on yesterday. Denies recent falls.  She reports taking medications as prescribed. She did not have her blood pressure, FBS and weight log at the time of the call. Will follow-up to review readings.  She denies changes in care management needs. Reports completing provider appointments as scheduled. States her daughter Shannon Baxter is still available to assist as needed. Agreeable to follow-up later this month.  PLAN -Will follow-up later this month.  [place] [facility] [phone]

## 2019-07-13 ENCOUNTER — Other Ambulatory Visit: Payer: Self-pay

## 2019-07-13 NOTE — Patient Outreach (Signed)
[facility]) Care Management  07/13/2019  Shannon Baxter May 25, 1961 [id number]     Follow-up outreach with Shannon Baxter. Reports doing very well today. Denies worsening or concerning symptoms over the past two weeks. She remains compliant with medications and attend MD appointments as scheduled. She admits to not monitoring her BP and weight regularly but is attempting to increase compliance. She verbalized understanding of parameters and indications for notifying MD. Current Outpatient Medications on File Prior to Visit  Medication Sig Dispense Refill  . albuterol (PROVENTIL HFA;VENTOLIN HFA) 108 (90 Base) MCG/ACT inhaler Inhale 1-2 puffs into the lungs every 6 (six) hours as needed for wheezing or shortness of breath.    . ALPRAZolam (XANAX) 0.5 MG tablet Take 0.5 mg by mouth 3 (three) times daily as needed for anxiety.   0  . amiodarone (PACERONE) 200 MG tablet Take 200 mg by mouth daily after breakfast.   0  . carvedilol (COREG) 25 MG tablet TAKE 1 TABLET BY MOUTH TWICE A DAY 180 tablet 1  . clopidogrel (PLAVIX) 75 MG tablet Take 75 mg by mouth daily after breakfast.   6  . Cyanocobalamin (B-12) 500 MCG TABS Take 3 tablets by mouth daily.    Marland Kitchen ENTRESTO 49-51 MG TAKE 1 TABLET BY MOUTH TWICE A DAY 60 tablet 10  . EPIPEN 2-PAK 0.3 MG/0.3ML SOAJ injection Inject 0.3 mg into the muscle once.   0  . ezetimibe (ZETIA) 10 MG tablet Take 10 mg by mouth daily.    . fluticasone (FLONASE) 50 MCG/ACT nasal spray Place 2 sprays into both nostrils daily as needed for allergies.     . fluticasone furoate-vilanterol (BREO ELLIPTA) 200-25 MCG/INH AEPB Inhale 1 puff into the lungs daily.    . furosemide (LASIX) 20 MG tablet Take 3 tablets (60 mg total) by mouth 2 (two) times daily. 180 tablet 1  . gabapentin (NEURONTIN) 300 MG capsule Take 300 mg by mouth 3 (three) times daily.   5  . glimepiride (AMARYL) 4 MG tablet Take 4 mg by mouth daily with breakfast.     . ipratropium-albuterol (DUONEB)  0.5-2.5 (3) MG/3ML SOLN Take 3 mLs by nebulization 2 (two) times daily.     Marland Kitchen levothyroxine (SYNTHROID, LEVOTHROID) 50 MCG tablet Take 50 mcg by mouth daily before breakfast.    . linaclotide (LINZESS) 72 MCG capsule Take 72 mcg by mouth daily as needed (constipation).    Marland Kitchen loratadine (CLARITIN) 10 MG tablet Take 10 mg by mouth at bedtime.     . metFORMIN (GLUCOPHAGE) 500 MG tablet Take 500 mg by mouth 2 (two) times daily with a meal.     . metoprolol (TOPROL-XL) 200 MG 24 hr tablet Take 200 mg by mouth daily after breakfast.     . montelukast (SINGULAIR) 10 MG tablet Take 10 mg by mouth at bedtime.     . Multiple Vitamins-Minerals (CENTRUM SILVER ULTRA WOMENS) TABS Take 1 tablet by mouth daily.    Marland Kitchen neomycin-polymyxin-hydrocortisone (CORTISPORIN) OTIC solution Apply twice daily to left 1st toenail 10 mL 0  . omeprazole (PRILOSEC) 20 MG capsule Take 20 mg by mouth at bedtime.     . Potassium Chloride ER 20 MEQ TBCR TAKE 1 TABLET BY MOUTH EVERY DAY 90 tablet 1  . Red Yeast Rice 600 MG TABS Take 2 tablets by mouth daily.    Marland Kitchen warfarin (COUMADIN) 1 MG tablet Take 1 mg by mouth See admin instructions. Take 6 mg on Sunday, Tuesday, and  Thursday .  Take 5 mg on all other days    . warfarin (COUMADIN) 5 MG tablet Take 5 mg by mouth See admin instructions. Take 6 mg on Sunday, Tuesday, and  Thursday . Take 5 mg on all other days     No current facility-administered medications on file prior to visit.       THN CM Care Plan Problem One     Most Recent Value  Care Plan Problem One  Risk for complications d/t multiple chronic diseases.  Role Documenting the Problem One  Care Management Coordinator  Care Plan for Problem One  Active  THN Long Term Goal   Over the next 90 days, patient will not experience disease related complications.  THN Long Term Goal Start Date  05/14/19  Interventions for Problem One Long Term Goal  Reinforced instructions regarding daily monitoring. Encouraged to continue taking  medications as prescribed.  THN CM Short Term Goal #1   Over the next 30 days patient will continue to take medications as prescribed.  THN CM Short Term Goal #1 Start Date  05/14/19  [facility]N CM Short Term Goal #1 Met Date  07/02/19  THN CM Short Term Goal #2   Over the next 30 days, patient will attend all provider appointments as scheduled.  THN CM Short Term Goal #2 Start Date  05/14/19  THN CM Short Term Goal #2 Met Date  07/02/19  THN CM Short Term Goal #3  Over the next 30 days, patient will monitor her BP daily and log readings.  THN CM Short Term Goal #3 Start Date  07/13/19  Interventions for Short Tern Goal #3  Encouraged patient to monitor and record BP readings. Discussed parameters and indications for notifying MD. Shannon Baxter most recent BP was 115/70.]  THN CM Short Term Goal #4  Over the next 30 days patient will increase compliance with diet recommendations.  THN CM Short Term Goal #4 Start Date  05/14/19  THN CM Short Term Goal #4 Met Date  07/13/19  THN CM Short Term Goal #5   Over the next 30 days patient will attempt to weigh at least once a week and maintain log.  THN CM Short Term Goal #5 Start Date  07/13/19  Interventions for Short Term Goal #5  Patient encouraged to monitor weight. Discussed CHF weight parameters and s/sx of fluid retention.  Reviewed indications for notifying MD.    [facility] CM Care Plan Problem Two     Most Recent Value  Care Plan Problem Two  High Risk for Falls  Role Documenting the Problem Two  Care Management [place] for Problem Two  Active  THN Long Term Goal  Patient will not fall over the next 60 days.  THN Long Term Goal Start Date  05/14/19  THN Long Term Goal Met Date  07/02/19  THN CM Short Term Goal #1   Over the next 30 days, patient will use assistive device when ambulating.  THN CM Short Term Goal #1 Start Date  05/14/19  [facility] CM Short Term Goal #1 Met Date   07/02/19  THN CM Short Term Goal #2   Over the next 30 days patient  will practice home safety measures.  THN CM Short Term Goal #2 Start Date  05/14/19  [facility] CM Short Term Goal #2 Met Date  07/02/19      PLAN -Will continue routine outreach.   [place] Care Management [phone]

## 2019-07-16 DIAGNOSIS — R791 Abnormal coagulation profile: Secondary | ICD-10-CM | POA: Diagnosis not present

## 2019-07-20 ENCOUNTER — Telehealth: Payer: Self-pay | Admitting: Cardiology

## 2019-07-20 NOTE — Telephone Encounter (Signed)
The patient stated that she only needed someone to drive her to the appointment. She stated that her guest can wait in the car or in the hallway. She has verbalized her understanding that she will need to wear a mask.

## 2019-07-20 NOTE — Telephone Encounter (Signed)
New Message  Patient is calling in to get clearance to have someone accompany her to her appointment on 07/23/19 at 11:00 am. Please give patient a call back to confirm.

## 2019-07-21 DIAGNOSIS — E119 Type 2 diabetes mellitus without complications: Secondary | ICD-10-CM | POA: Diagnosis not present

## 2019-07-23 ENCOUNTER — Ambulatory Visit (INDEPENDENT_AMBULATORY_CARE_PROVIDER_SITE_OTHER): Payer: Medicare Other | Admitting: Pharmacist Clinician (PhC)/ Clinical Pharmacy Specialist

## 2019-07-23 ENCOUNTER — Other Ambulatory Visit: Payer: Self-pay

## 2019-07-23 DIAGNOSIS — R791 Abnormal coagulation profile: Secondary | ICD-10-CM | POA: Diagnosis not present

## 2019-07-23 DIAGNOSIS — E785 Hyperlipidemia, unspecified: Secondary | ICD-10-CM | POA: Diagnosis not present

## 2019-07-23 MED ORDER — ROSUVASTATIN CALCIUM 10 MG PO TABS: ORAL_TABLET | ORAL | 3 refills | Status: DC

## 2019-07-23 NOTE — Assessment & Plan Note (Signed)
Patient with documented ASCVD currently on ezetimibe 10 mg daily.  She has been on atorvastatin for several years, but recently discontinued due to increasing muscle aches.  She has not tried any other statin drugs.  Today we will have her start rosuvastatin 10 mg three times per week.  She understands the need to try a second statin drug before insurance will approve PCSK-9 inhibitor.  She will contact us in 4-6 weeks and let us know how she is tolerating the rosuvastatin.  At that time we will need repeat labs and can move forward with Repatha at that time.

## 2019-07-23 NOTE — Patient Instructions (Addendum)
Stop atorvastatin.  Start rosuvastatin 10 mg three times per week (Monday, Wednesday and Friday).  After one month please call to let me know how you are tolerating this.  At that time we will get you to have cholesterol labs repeated and then we can apply to your insurance company for [place].   If you need assistance in paying for your cholesterol medications, reach out to the [employer] ([email or web address]) and list your disease state as "hypercholesterolemia".  They can help cover the costs of all your cholesterol medications.  Burnette Sautter/Raquel at [phone]

## 2019-07-23 NOTE — Assessment & Plan Note (Signed)
>>  ASSESSMENT AND PLAN FOR DYSLIPIDEMIA WRITTEN ON 07/23/2019 12:47 PM BY ALVSTAD, KRISTIN L, RPH-CPP  Patient with documented ASCVD currently on ezetimibe 10 mg daily.  She has been on atorvastatin for several years, but recently discontinued due to increasing muscle aches.  She has not tried any other statin drugs.  Today we will have her start rosuvastatin  10 mg three times per week.  She understands the need to try a second statin drug before insurance will approve PCSK-9 inhibitor.  She will contact us  in 4-6 weeks and let us  know how she is tolerating the rosuvastatin .  At that time we will need repeat labs and can move forward with Repatha at that time.

## 2019-07-23 NOTE — Progress Notes (Signed)
07/23/2019 Shannon Baxter 1960/12/08 [id number]   HPI:  Shannon Baxter is a 58 y.o. female patient of Dr Agustin Cree, who presents today for a lipid clinic evaluation.  In addition to hyperlipidemia, her medical history is significant for HFrEF (EF 30-35% 2019), CAD (intermittent claudication, bilateral; coronary stents), AVR, DM (A1c 5.8 03/2019) and obesity (BMI 48.6).    Current Medications: ezetimibe 10 mg qd  Cholesterol Goals: < 70   Intolerant/previously tried: atorvastatin - myaligias, whole body ache   Family history:  Father died from [place], was 53; mother with heart disease  Siblings all healthy  Daughter with atrial flutters, DM, alcohol, tobacco  Diet: eats out about twice weekly, tries to get chicken rather than burgers; most meats at home, baked not fried; eats squash, pinto beans;  Tries to avoid greens due to warfarin  Exercise:  Went to gym last week, fell and family hesitant to let her go back; gets into pool - self exercise  Labs: 03/2019:  TC 219, TG 85, HDL 48, LDL 154   Current Outpatient Medications  Medication Sig Dispense Refill  . albuterol (PROVENTIL HFA;VENTOLIN HFA) 108 (90 Base) MCG/ACT inhaler Inhale 1-2 puffs into the lungs every 6 (six) hours as needed for wheezing or shortness of breath.    . ALPRAZolam (XANAX) 0.5 MG tablet Take 0.5 mg by mouth 3 (three) times daily as needed for anxiety.   0  . amiodarone (PACERONE) 200 MG tablet Take 200 mg by mouth daily after breakfast.   0  . atorvastatin (LIPITOR) 20 MG tablet Take 20 mg by mouth daily.    . carvedilol (COREG) 25 MG tablet TAKE 1 TABLET BY MOUTH TWICE A DAY 180 tablet 1  . clopidogrel (PLAVIX) 75 MG tablet Take 75 mg by mouth daily after breakfast.   6  . Cyanocobalamin (B-12) 500 MCG TABS Take 3 tablets by mouth daily.    Marland Kitchen ENTRESTO 49-51 MG TAKE 1 TABLET BY MOUTH TWICE A DAY 60 tablet 10  . EPIPEN 2-PAK 0.3 MG/0.3ML SOAJ injection Inject 0.3 mg into the muscle once.   0  . ezetimibe (ZETIA) 10 MG tablet  Take 10 mg by mouth daily.    . fluticasone (FLONASE) 50 MCG/ACT nasal spray Place 2 sprays into both nostrils daily as needed for allergies.     . fluticasone furoate-vilanterol (BREO ELLIPTA) 200-25 MCG/INH AEPB Inhale 1 puff into the lungs daily.    . furosemide (LASIX) 20 MG tablet Take 3 tablets (60 mg total) by mouth 2 (two) times daily. 180 tablet 1  . gabapentin (NEURONTIN) 300 MG capsule Take 300 mg by mouth 3 (three) times daily.   5  . glimepiride (AMARYL) 4 MG tablet Take 4 mg by mouth daily with breakfast.     . ipratropium-albuterol (DUONEB) 0.5-2.5 (3) MG/3ML SOLN Take 3 mLs by nebulization 2 (two) times daily.     Marland Kitchen levothyroxine (SYNTHROID, LEVOTHROID) 50 MCG tablet Take 50 mcg by mouth daily before breakfast.    . linaclotide (LINZESS) 72 MCG capsule Take 72 mcg by mouth daily as needed (constipation).    Marland Kitchen loratadine (CLARITIN) 10 MG tablet Take 10 mg by mouth at bedtime.     . metFORMIN (GLUCOPHAGE) 500 MG tablet Take 500 mg by mouth 2 (two) times daily with a meal.     . metoprolol (TOPROL-XL) 200 MG 24 hr tablet Take 200 mg by mouth daily after breakfast.     . montelukast (SINGULAIR) 10 MG tablet Take 10 mg by  mouth at bedtime.     . Multiple Vitamins-Minerals (CENTRUM SILVER ULTRA WOMENS) TABS Take 1 tablet by mouth daily.    Marland Kitchen neomycin-polymyxin-hydrocortisone (CORTISPORIN) OTIC solution Apply twice daily to left 1st toenail 10 mL 0  . omeprazole (PRILOSEC) 20 MG capsule Take 20 mg by mouth at bedtime.     . Potassium Chloride ER 20 MEQ TBCR TAKE 1 TABLET BY MOUTH EVERY DAY 90 tablet 1  . Red Yeast Rice 600 MG TABS Take 2 tablets by mouth daily.    Marland Kitchen warfarin (COUMADIN) 1 MG tablet Take 1 mg by mouth See admin instructions. Take 6 mg on Sunday, Tuesday, and  Thursday . Take 5 mg on all other days    . warfarin (COUMADIN) 5 MG tablet Take 5 mg by mouth See admin instructions. Take 6 mg on Sunday, Tuesday, and  Thursday . Take 5 mg on all other days    . rosuvastatin  (CRESTOR) 10 MG tablet Take up to 1 tablet daily as tolerated 30 tablet 3   No current facility-administered medications for this visit.     Allergies  Allergen Reactions  . Iodinated Diagnostic Agents Hives and Rash  . Ioxaglate Hives    Past Medical History:  Diagnosis Date  . Allergy   . Aortic insufficiency   . Aortic stenosis   . Arthritis   . Asthma   . CHF (congestive heart failure) (HCC)   . Claudication (HCC)   . Coronary artery disease   . Diabetes mellitus without complication (HCC)   . GERD (gastroesophageal reflux disease)   . Heart murmur   . Hyperlipidemia   . Hypertension   . Nutritional and metabolic cardiomyopathy (HCC)   . PVD (peripheral vascular disease) (HCC)   . Sleep apnea   . Stroke (HCC)     Blood pressure 110/76, pulse 82, resp. rate 16, height 6' (1.829 m), SpO2 97 %.   Dyslipidemia Patient with documented ASCVD currently on ezetimibe 10 mg daily.  She has been on atorvastatin for several years, but recently discontinued due to increasing muscle aches.  She has not tried any other statin drugs.  Today we will have her start rosuvastatin 10 mg three times per week.  She understands the need to try a second statin drug before insurance will approve PCSK-9 inhibitor.  She will contact us in 4-6 weeks and let us know how she is tolerating the rosuvastatin.  At that time we will need repeat labs and can move forward with Repatha at that time.     Phillips Hay PharmD CPP [facility]

## 2019-07-29 ENCOUNTER — Ambulatory Visit (INDEPENDENT_AMBULATORY_CARE_PROVIDER_SITE_OTHER): Payer: Medicare Other

## 2019-07-29 ENCOUNTER — Other Ambulatory Visit: Payer: Self-pay

## 2019-07-29 DIAGNOSIS — I480 Paroxysmal atrial fibrillation: Secondary | ICD-10-CM

## 2019-07-29 DIAGNOSIS — I420 Dilated cardiomyopathy: Secondary | ICD-10-CM

## 2019-07-29 NOTE — Progress Notes (Signed)
Complete echocardiogram has been performed.  Jimmy Denay Pleitez RDCS, RVT 

## 2019-07-30 DIAGNOSIS — K645 Perianal venous thrombosis: Secondary | ICD-10-CM | POA: Diagnosis not present

## 2019-08-04 DIAGNOSIS — S80212A Abrasion, left knee, initial encounter: Secondary | ICD-10-CM | POA: Diagnosis not present

## 2019-08-04 DIAGNOSIS — E162 Hypoglycemia, unspecified: Secondary | ICD-10-CM | POA: Diagnosis not present

## 2019-08-04 DIAGNOSIS — M7989 Other specified soft tissue disorders: Secondary | ICD-10-CM | POA: Diagnosis not present

## 2019-08-04 DIAGNOSIS — R58 Hemorrhage, not elsewhere classified: Secondary | ICD-10-CM | POA: Diagnosis not present

## 2019-08-04 DIAGNOSIS — T148XXA Other injury of unspecified body region, initial encounter: Secondary | ICD-10-CM | POA: Diagnosis not present

## 2019-08-04 DIAGNOSIS — R0781 Pleurodynia: Secondary | ICD-10-CM | POA: Diagnosis not present

## 2019-08-04 DIAGNOSIS — M25562 Pain in left knee: Secondary | ICD-10-CM | POA: Diagnosis not present

## 2019-08-04 DIAGNOSIS — S0990XA Unspecified injury of head, initial encounter: Secondary | ICD-10-CM | POA: Diagnosis not present

## 2019-08-04 DIAGNOSIS — E161 Other hypoglycemia: Secondary | ICD-10-CM | POA: Diagnosis not present

## 2019-08-04 DIAGNOSIS — S299XXA Unspecified injury of thorax, initial encounter: Secondary | ICD-10-CM | POA: Diagnosis not present

## 2019-08-04 DIAGNOSIS — S8992XA Unspecified injury of left lower leg, initial encounter: Secondary | ICD-10-CM | POA: Diagnosis not present

## 2019-08-04 DIAGNOSIS — S0081XA Abrasion of other part of head, initial encounter: Secondary | ICD-10-CM | POA: Diagnosis not present

## 2019-08-04 DIAGNOSIS — M25572 Pain in left ankle and joints of left foot: Secondary | ICD-10-CM | POA: Diagnosis not present

## 2019-08-04 DIAGNOSIS — S99912A Unspecified injury of left ankle, initial encounter: Secondary | ICD-10-CM | POA: Diagnosis not present

## 2019-08-04 DIAGNOSIS — E119 Type 2 diabetes mellitus without complications: Secondary | ICD-10-CM | POA: Diagnosis not present

## 2019-08-04 DIAGNOSIS — R52 Pain, unspecified: Secondary | ICD-10-CM | POA: Diagnosis not present

## 2019-08-06 DIAGNOSIS — S81812A Laceration without foreign body, left lower leg, initial encounter: Secondary | ICD-10-CM | POA: Diagnosis not present

## 2019-08-06 DIAGNOSIS — W19XXXA Unspecified fall, initial encounter: Secondary | ICD-10-CM | POA: Diagnosis not present

## 2019-08-06 DIAGNOSIS — S81002A Unspecified open wound, left knee, initial encounter: Secondary | ICD-10-CM | POA: Diagnosis not present

## 2019-08-06 DIAGNOSIS — Y92009 Unspecified place in unspecified non-institutional (private) residence as the place of occurrence of the external cause: Secondary | ICD-10-CM | POA: Diagnosis not present

## 2019-08-06 DIAGNOSIS — R791 Abnormal coagulation profile: Secondary | ICD-10-CM | POA: Diagnosis not present

## 2019-08-13 ENCOUNTER — Other Ambulatory Visit: Payer: Self-pay | Admitting: Sports Medicine

## 2019-08-14 ENCOUNTER — Telehealth: Payer: Self-pay | Admitting: Cardiology

## 2019-08-14 ENCOUNTER — Encounter: Payer: Self-pay | Admitting: Sports Medicine

## 2019-08-14 ENCOUNTER — Other Ambulatory Visit: Payer: Self-pay | Admitting: Sports Medicine

## 2019-08-14 ENCOUNTER — Other Ambulatory Visit: Payer: Self-pay

## 2019-08-14 ENCOUNTER — Ambulatory Visit (INDEPENDENT_AMBULATORY_CARE_PROVIDER_SITE_OTHER): Payer: Medicare Other

## 2019-08-14 ENCOUNTER — Ambulatory Visit (INDEPENDENT_AMBULATORY_CARE_PROVIDER_SITE_OTHER): Payer: Medicare Other | Admitting: Sports Medicine

## 2019-08-14 DIAGNOSIS — M79672 Pain in left foot: Secondary | ICD-10-CM | POA: Diagnosis not present

## 2019-08-14 DIAGNOSIS — M779 Enthesopathy, unspecified: Secondary | ICD-10-CM

## 2019-08-14 DIAGNOSIS — M79671 Pain in right foot: Secondary | ICD-10-CM

## 2019-08-14 DIAGNOSIS — S93602A Unspecified sprain of left foot, initial encounter: Secondary | ICD-10-CM

## 2019-08-14 NOTE — Telephone Encounter (Signed)
Called patient she was wanting echo results. Patient informed of echo results.

## 2019-08-14 NOTE — Progress Notes (Signed)
Subjective: Shannon Baxter is a 58 y.o. female patient who presents to office for evaluation of left foot pain. Patient complains of progressive pain since Tuesday reports that initially when she fell on Monday of last week the pain was 10 out of 10 but now 3 out of 10 sharp in nature worse with weightbearing walking or standing reports that she fell and twisted her foot when she was coming out of the door and reports that she has been elevating icing and using Epson salt with some relief.  Patient reports that because she is diabetic she was concerned that the injury was worse.  Patient denies any other pedal complaints at this time.   Fasting blood sugar not recorded.  Patient Active Problem List   Diagnosis Date Noted  . Paroxysmal atrial flutter (HCC) 09/01/2018  . Chronic systolic congestive heart failure (HCC) 12/06/2016  . Morbid obesity (HCC) 12/06/2016  . Presence of automatic implantable cardioverter-defibrillator 10/23/2016  . Dilated cardiomyopathy (HCC) 09/03/2016  . Status post aortic valve replacement 03/29/2016  . Paroxysmal atrial fibrillation (HCC) 03/06/2016  . Rheumatic aortic valve insufficiency 03/05/2016  . Acute on chronic systolic heart failure (HCC) 02/21/2016  . Hypothyroidism 01/23/2016  . Aortic valve stenosis, rheumatic 11/22/2015  . Type 2 diabetes mellitus with diabetic peripheral angiopathy without gangrene, without long-term current use of insulin (HCC) 11/22/2015  . Dyspnea on exertion 10/26/2015  . Atherosclerosis of native arteries of extremities with intermittent claudication, bilateral legs (HCC) 08/04/2015  . Carotid atherosclerosis 07/26/2015  . Coronary artery disease involving native coronary artery of native heart without angina pectoris 07/26/2015  . Dyslipidemia 07/26/2015  . Essential hypertension 07/26/2015  . Intermittent claudication (HCC) 07/26/2015  . Peripheral vascular disease (HCC) 07/26/2015  . History of tobacco abuse 07/26/2015     Current Outpatient Medications on File Prior to Visit  Medication Sig Dispense Refill  . albuterol (PROVENTIL HFA;VENTOLIN HFA) 108 (90 Base) MCG/ACT inhaler Inhale 1-2 puffs into the lungs every 6 (six) hours as needed for wheezing or shortness of breath.    . ALPRAZolam (XANAX) 0.5 MG tablet Take 0.5 mg by mouth 3 (three) times daily as needed for anxiety.   0  . amiodarone (PACERONE) 200 MG tablet Take 200 mg by mouth daily after breakfast.   0  . atorvastatin (LIPITOR) 20 MG tablet Take 20 mg by mouth daily.    . carvedilol (COREG) 25 MG tablet TAKE 1 TABLET BY MOUTH TWICE A DAY 180 tablet 1  . clopidogrel (PLAVIX) 75 MG tablet Take 75 mg by mouth daily after breakfast.   6  . Cyanocobalamin (B-12) 500 MCG TABS Take 3 tablets by mouth daily.    Marland Kitchen ENTRESTO 49-51 MG TAKE 1 TABLET BY MOUTH TWICE A DAY 60 tablet 10  . EPIPEN 2-PAK 0.3 MG/0.3ML SOAJ injection Inject 0.3 mg into the muscle once.   0  . ezetimibe (ZETIA) 10 MG tablet Take 10 mg by mouth daily.    . fluticasone (FLONASE) 50 MCG/ACT nasal spray Place 2 sprays into both nostrils daily as needed for allergies.     . fluticasone furoate-vilanterol (BREO ELLIPTA) 200-25 MCG/INH AEPB Inhale 1 puff into the lungs daily.    . furosemide (LASIX) 20 MG tablet Take 3 tablets (60 mg total) by mouth 2 (two) times daily. 180 tablet 1  . gabapentin (NEURONTIN) 300 MG capsule Take 300 mg by mouth 3 (three) times daily.   5  . glimepiride (AMARYL) 4 MG tablet Take 4 mg by mouth  daily with breakfast.     . ipratropium-albuterol (DUONEB) 0.5-2.5 (3) MG/3ML SOLN Take 3 mLs by nebulization 2 (two) times daily.     Marland Kitchen levothyroxine (SYNTHROID, LEVOTHROID) 50 MCG tablet Take 50 mcg by mouth daily before breakfast.    . linaclotide (LINZESS) 72 MCG capsule Take 72 mcg by mouth daily as needed (constipation).    Marland Kitchen loratadine (CLARITIN) 10 MG tablet Take 10 mg by mouth at bedtime.     . metFORMIN (GLUCOPHAGE) 500 MG tablet Take 500 mg by mouth 2 (two)  times daily with a meal.     . metoprolol (TOPROL-XL) 200 MG 24 hr tablet Take 200 mg by mouth daily after breakfast.     . montelukast (SINGULAIR) 10 MG tablet Take 10 mg by mouth at bedtime.     . Multiple Vitamins-Minerals (CENTRUM SILVER ULTRA WOMENS) TABS Take 1 tablet by mouth daily.    Marland Kitchen neomycin-polymyxin-hydrocortisone (CORTISPORIN) OTIC solution Apply twice daily to left 1st toenail 10 mL 0  . omeprazole (PRILOSEC) 20 MG capsule Take 20 mg by mouth at bedtime.     . Potassium Chloride ER 20 MEQ TBCR TAKE 1 TABLET BY MOUTH EVERY DAY 90 tablet 1  . Red Yeast Rice 600 MG TABS Take 2 tablets by mouth daily.    . rosuvastatin (CRESTOR) 10 MG tablet Take up to 1 tablet daily as tolerated 30 tablet 3  . warfarin (COUMADIN) 1 MG tablet Take 1 mg by mouth See admin instructions. Take 6 mg on Sunday, Tuesday, and  Thursday . Take 5 mg on all other days    . warfarin (COUMADIN) 5 MG tablet Take 5 mg by mouth See admin instructions. Take 6 mg on Sunday, Tuesday, and  Thursday . Take 5 mg on all other days     No current facility-administered medications on file prior to visit.     Allergies  Allergen Reactions  . Iodinated Diagnostic Agents Hives and Rash  . Ioxaglate Hives    Objective:  General: Alert and oriented x3 in no acute distress  Dermatology: No open lesions bilateral lower extremities, no webspace macerations, no ecchymosis bilateral, all nails x 10 are short and thick but well manicured.  Vascular: Dorsalis Pedis and Posterior Tibial pedal pulses palpable, Capillary Fill Time 3 seconds,(+) pedal hair growth bilateral, no edema bilateral lower extremities, Temperature gradient within normal limits.  Neurology: Michaell Cowing sensation intact via light touch bilateral.  Musculoskeletal: Mild tenderness with palpation at peroneal tendon insertion on the left foot.  Mild guarding with range of motion on the left.  Gait: Antalgic gait  Xrays  Left foot   Impression: No fracture or  dislocation, mild soft tissue swelling, no other acute findings.  Assessment and Plan: Problem List Items Addressed This Visit    None    Visit Diagnoses    Sprain of left foot, initial encounter    -  Primary   Left foot pain       Tendonitis          -Complete examination performed -Xrays reviewed -Discussed treatment options for sprain versus tendinitis -Dispensed short cam boot and advised patient to wear this boot for the next 10 days after 10 days may transition back to using tennis shoe -Dispensed Sugritube compression sleeve to use to give her foot and ankle some support and to help with edema control -Advised patient to continue with rest ice elevation Epson salt soaks as needed and topical pain cream as needed -Patient to  return to office as scheduled or sooner if condition worsens.  Landis Martins, DPM

## 2019-08-14 NOTE — Telephone Encounter (Signed)
Please call to discuss meds.

## 2019-08-17 ENCOUNTER — Other Ambulatory Visit: Payer: Self-pay | Admitting: Sports Medicine

## 2019-08-17 DIAGNOSIS — S93602A Unspecified sprain of left foot, initial encounter: Secondary | ICD-10-CM

## 2019-08-19 ENCOUNTER — Other Ambulatory Visit: Payer: Self-pay

## 2019-08-20 DIAGNOSIS — G4733 Obstructive sleep apnea (adult) (pediatric): Secondary | ICD-10-CM | POA: Diagnosis not present

## 2019-08-20 DIAGNOSIS — R791 Abnormal coagulation profile: Secondary | ICD-10-CM | POA: Diagnosis not present

## 2019-08-27 DIAGNOSIS — G4733 Obstructive sleep apnea (adult) (pediatric): Secondary | ICD-10-CM | POA: Diagnosis not present

## 2019-08-27 DIAGNOSIS — J4530 Mild persistent asthma, uncomplicated: Secondary | ICD-10-CM | POA: Diagnosis not present

## 2019-08-27 DIAGNOSIS — J301 Allergic rhinitis due to pollen: Secondary | ICD-10-CM | POA: Diagnosis not present

## 2019-08-31 ENCOUNTER — Ambulatory Visit (INDEPENDENT_AMBULATORY_CARE_PROVIDER_SITE_OTHER): Payer: Medicare Other | Admitting: Cardiology

## 2019-08-31 ENCOUNTER — Other Ambulatory Visit: Payer: Self-pay

## 2019-08-31 ENCOUNTER — Encounter: Payer: Self-pay | Admitting: Cardiology

## 2019-08-31 VITALS — BP 130/84 | HR 84 | Ht 72.0 in | Wt 345.0 lb

## 2019-08-31 DIAGNOSIS — I480 Paroxysmal atrial fibrillation: Secondary | ICD-10-CM

## 2019-08-31 DIAGNOSIS — I420 Dilated cardiomyopathy: Secondary | ICD-10-CM

## 2019-08-31 DIAGNOSIS — I2510 Atherosclerotic heart disease of native coronary artery without angina pectoris: Secondary | ICD-10-CM

## 2019-08-31 DIAGNOSIS — I10 Essential (primary) hypertension: Secondary | ICD-10-CM

## 2019-08-31 DIAGNOSIS — R0609 Other forms of dyspnea: Secondary | ICD-10-CM

## 2019-08-31 DIAGNOSIS — Z952 Presence of prosthetic heart valve: Secondary | ICD-10-CM | POA: Diagnosis not present

## 2019-08-31 DIAGNOSIS — R0600 Dyspnea, unspecified: Secondary | ICD-10-CM

## 2019-08-31 NOTE — Patient Instructions (Signed)
Medication Instructions:  Your physician recommends that you continue on your current medications as directed. Please refer to the Current Medication list given to you today.  *If you need a refill on your cardiac medications before your next appointment, please call your pharmacy*  Lab Work: Non.  If you have labs (blood work) drawn today and your tests are completely normal, you will receive your results only by: Marland Kitchen MyChart Message (if you have MyChart) OR . A paper copy in the mail If you have any lab test that is abnormal or we need to change your treatment, we will call you to review the results.  Testing/Procedures: None.   Follow-Up: At [facility], you and your health needs are our priority.  As part of our continuing mission to provide you with exceptional heart care, we have created designated Provider Care Teams.  These Care Teams include your primary Cardiologist (physician) and Advanced Practice Providers (APPs -  Physician Assistants and Nurse Practitioners) who all work together to provide you with the care you need, when you need it.  Your next appointment:   3 months  The format for your next appointment:   In Person  Provider:   Jenne Campus, MD  Other Instructions   Dr. Agustin Cree has advised you follow up with Dr. Curt Bears for pacemaker management.

## 2019-08-31 NOTE — Progress Notes (Signed)
Cardiology Office Note:    Date:  08/31/2019   ID:  Shannon Baxter, DOB [birth year], MRN [id number]  PCP:  Paulina Fusi, MD  Cardiologist:  Gypsy Balsam, MD    Referring MD: Paulina Fusi, MD   Chief Complaint  Patient presents with  . Follow-up  Doing well  History of Present Illness:    Shannon Baxter is a 58 y.o. female with history of aortic valve stenosis test was aortic valve replacement with mechanical prosthesis, essential hypertension, cardiomyopathy with ejection fraction of 30-35 based on latest echocardiogram on appropriate medications she comes today to my office for follow-up.  Overall she described episode of dizziness actually 1 time she fell down its a very brief momentary sensation last week and she was trying to go outside and then She knows she was on the floor.  No shocks from the defibrillator.  No palpitations no shortness of breath no pains before this sensation.  Otherwise she says she is doing well she is doing much better compared to 6 months ago she said she got much more energy she can walk she can climb stairs with no major difficulties breathing is much better she sleeps only on one pillow not multiple pillows.  Past Medical History:  Diagnosis Date  . Allergy   . Aortic insufficiency   . Aortic stenosis   . Arthritis   . Asthma   . CHF (congestive heart failure) (HCC)   . Claudication (HCC)   . Coronary artery disease   . Diabetes mellitus without complication (HCC)   . GERD (gastroesophageal reflux disease)   . Heart murmur   . Hyperlipidemia   . Hypertension   . Nutritional and metabolic cardiomyopathy (HCC)   . PVD (peripheral vascular disease) (HCC)   . Sleep apnea   . Stroke [facility])     Past Surgical History:  Procedure Laterality Date  . A-FLUTTER ABLATION N/A 11/05/2018   Procedure: A-FLUTTER ABLATION;  Surgeon: Regan Lemming, MD;  Location: [facility] INV[facility];  Service: Cardiovascular;  Laterality: N/A;  . CARDIAC  CATHETERIZATION    . INSERT / REPLACE / REMOVE PACEMAKER     Medtronic ICD  . MECHANICAL AORTIC VALVE REPLACEMENT    . PERIPHERAL ARTERIAL STENT GRAFT    . TUBAL LIGATION      Current Medications: Current Meds  Medication Sig  . albuterol (PROVENTIL HFA;VENTOLIN HFA) 108 (90 Base) MCG/ACT inhaler Inhale 1-2 puffs into the lungs every 6 (six) hours as needed for wheezing or shortness of breath.  . ALPRAZolam (XANAX) 0.5 MG tablet Take 0.5 mg by mouth 3 (three) times daily as needed for anxiety.   Marland Kitchen amiodarone (PACERONE) 200 MG tablet Take 200 mg by mouth daily after breakfast.   . atorvastatin (LIPITOR) 20 MG tablet Take 20 mg by mouth daily.  . carvedilol (COREG) 25 MG tablet TAKE 1 TABLET BY MOUTH TWICE A DAY  . clopidogrel (PLAVIX) 75 MG tablet Take 75 mg by mouth daily after breakfast.   . Cyanocobalamin (B-12) 500 MCG TABS Take 3 tablets by mouth daily.  Marland Kitchen ENTRESTO 49-51 MG TAKE 1 TABLET BY MOUTH TWICE A DAY  . EPIPEN 2-PAK 0.3 MG/0.3ML SOAJ injection Inject 0.3 mg into the muscle once.   . ezetimibe (ZETIA) 10 MG tablet Take 10 mg by mouth daily.  . fluticasone (FLONASE) 50 MCG/ACT nasal spray Place 2 sprays into both nostrils daily as needed for allergies.   . fluticasone furoate-vilanterol (BREO ELLIPTA) 200-25 MCG/INH AEPB Inhale  1 puff into the lungs daily.  . furosemide (LASIX) 20 MG tablet Take 3 tablets (60 mg total) by mouth 2 (two) times daily.  Marland Kitchen gabapentin (NEURONTIN) 300 MG capsule Take 300 mg by mouth 3 (three) times daily.   Marland Kitchen glimepiride (AMARYL) 4 MG tablet Take 4 mg by mouth daily with breakfast.   . ipratropium-albuterol (DUONEB) 0.5-2.5 (3) MG/3ML SOLN Take 3 mLs by nebulization 2 (two) times daily.   Marland Kitchen levothyroxine (SYNTHROID, LEVOTHROID) 50 MCG tablet Take 50 mcg by mouth daily before breakfast.  . linaclotide (LINZESS) 72 MCG capsule Take 72 mcg by mouth daily as needed (constipation).  Marland Kitchen loratadine (CLARITIN) 10 MG tablet Take 10 mg by mouth at bedtime.   .  metFORMIN (GLUCOPHAGE) 500 MG tablet Take 500 mg by mouth 2 (two) times daily with a meal.   . metoprolol (TOPROL-XL) 200 MG 24 hr tablet Take 200 mg by mouth daily after breakfast.   . montelukast (SINGULAIR) 10 MG tablet Take 10 mg by mouth at bedtime.   . Multiple Vitamins-Minerals (CENTRUM SILVER ULTRA WOMENS) TABS Take 1 tablet by mouth daily.  Marland Kitchen neomycin-polymyxin-hydrocortisone (CORTISPORIN) OTIC solution Apply twice daily to left 1st toenail  . omeprazole (PRILOSEC) 20 MG capsule Take 20 mg by mouth at bedtime.   . Potassium Chloride ER 20 MEQ TBCR TAKE 1 TABLET BY MOUTH EVERY DAY  . Red Yeast Rice 600 MG TABS Take 2 tablets by mouth daily.  . rosuvastatin (CRESTOR) 10 MG tablet Take up to 1 tablet daily as tolerated  . warfarin (COUMADIN) 1 MG tablet Take 1 mg by mouth See admin instructions. Take 6 mg on Sunday, Tuesday, and  Thursday . Take 5 mg on all other days  . warfarin (COUMADIN) 5 MG tablet Take 5 mg by mouth See admin instructions. Take 6 mg on Sunday, Tuesday, and  Thursday . Take 5 mg on all other days     Allergies:   Iodinated diagnostic agents and Ioxaglate   Social History   Socioeconomic History  . Marital status: Single    Spouse name: Not on file  . Number of children: Not on file  . Years of education: Not on file  . Highest education level: Not on file  Occupational History  . Not on file  Social Needs  . Financial resource strain: Not on file  . Food insecurity    Worry: Not on file    Inability: Not on file  . Transportation needs    Medical: No    Non-medical: No  Tobacco Use  . Smoking status: Former Research scientist (life sciences)  . Smokeless tobacco: Never Used  Substance and Sexual Activity  . Alcohol use: No  . Drug use: No  . Sexual activity: Not on file  Lifestyle  . Physical activity    Days per week: Not on file    Minutes per session: Not on file  . Stress: Not on file  Relationships  . Social Herbalist on phone: Not on file    Gets  together: Not on file    Attends religious service: Not on file    Active member of club or organization: Not on file    Attends meetings of clubs or organizations: Not on file    Relationship status: Not on file  Other Topics Concern  . Not on file  Social History Narrative  . Not on file     Family History: The patient's family history includes Heart disease in  her brother. ROS:   Please see the history of present illness.    All 14 point review of systems negative except as described per history of present illness  EKGs/Labs/Other Studies Reviewed:      Recent Labs: 11/05/2018: Hemoglobin 11.8; Platelets 241 11/24/2018: NT-Pro BNP 668 12/29/2018: Magnesium 2.1 06/24/2019: BUN 24; Creatinine, Ser 1.10; Potassium 4.3; Sodium 144  Recent Lipid Panel    Component Value Date/Time   CHOL  07/04/2007 0815    200        ATP III CLASSIFICATION:  <200     mg/dL   Desirable  030-092  mg/dL   Borderline High  >=330    mg/dL   High   TRIG 076 22/63/3354 0815   HDL 47 07/04/2007 0815   CHOLHDL 4.3 07/04/2007 0815   VLDL 28 07/04/2007 0815   LDLCALC (H) 07/04/2007 0815    125        Total Cholesterol/HDL:CHD Risk Coronary Heart Disease Risk Table                     Men   Women  1/2 Average Risk   3.4   3.3    Physical Exam:    VS:  BP 130/84   Pulse 84   Ht 6' (1.829 m)   Wt (!) 345 lb (156.5 kg)   SpO2 97%   BMI 46.79 kg/m     Wt Readings from Last 3 Encounters:  08/31/19 (!) 345 lb (156.5 kg)  06/24/19 (!) 339 lb (153.8 kg)  02/23/19 (!) 321 lb (145.6 kg)     GEN:  Well nourished, well developed in no acute distress HEENT: Normal NECK: No JVD; No carotid bruits LYMPHATICS: No lymphadenopathy CARDIAC: RRR, mechanical valve sounds are crisp's, no rubs, no gallops RESPIRATORY:  Clear to auscultation without rales, wheezing or rhonchi  ABDOMEN: Soft, non-tender, non-distended MUSCULOSKELETAL:  No edema; No deformity  SKIN: Warm and dry LOWER EXTREMITIES: no  swelling NEUROLOGIC:  Alert and oriented x 3 PSYCHIATRIC:  Normal affect   ASSESSMENT:    1. Paroxysmal atrial fibrillation (HCC)   2. Essential hypertension   3. Dilated cardiomyopathy (HCC)   4. Coronary artery disease involving native coronary artery of native heart without angina pectoris   5. Status post aortic valve replacement   6. Dyspnea on exertion    PLAN:    In order of problems listed above:  1. Paroxysmal atrial fibrillation.  Anticoagulated.  Seems to maintain sinus rhythm we will continue present management 2. Essential hypertension blood pressure well controlled continue present management 3. Dilated cardiomyopathy on beta-blocker and I noticed some discrepancy in the chart.  We will try to investigate is it said Coreg and Toprol-XL.  She is also on Entresto which I will continue.  Obviously will clarify the issue with beta-blocker. 4. Episode of near syncope and 1 syncope.  We will interrogate her device to make sure is not arrhythmia related 5. Status post aortic valve replacement recently echocardiogram done just few weeks ago showed normally functioning valve. 6. Dyspnea on exertion significantly improved   Medication Adjustments/Labs and Tests Ordered: Current medicines are reviewed at length with the patient today.  Concerns regarding medicines are outlined above.  No orders of the defined types were placed in this encounter.  Medication changes: No orders of the defined types were placed in this encounter.   Signed, Georgeanna Lea, MD, [facility] 08/31/2019 3:02 PM    [facility]

## 2019-09-01 ENCOUNTER — Ambulatory Visit (INDEPENDENT_AMBULATORY_CARE_PROVIDER_SITE_OTHER): Payer: Medicare Other | Admitting: *Deleted

## 2019-09-01 DIAGNOSIS — I420 Dilated cardiomyopathy: Secondary | ICD-10-CM | POA: Diagnosis not present

## 2019-09-01 DIAGNOSIS — I5023 Acute on chronic systolic (congestive) heart failure: Secondary | ICD-10-CM

## 2019-09-01 LAB — CUP PACEART REMOTE DEVICE CHECK
Battery Remaining Longevity: 104 mo
Battery Voltage: 3 V
Brady Statistic AP VP Percent: 0.01 %
Brady Statistic AP VS Percent: 2.61 %
Brady Statistic AS VP Percent: 0.04 %
Brady Statistic AS VS Percent: 97.35 %
Brady Statistic RA Percent Paced: 2.61 %
Brady Statistic RV Percent Paced: 0.04 %
Date Time Interrogation Session: 20201110113423
HighPow Impedance: 78 Ohm
Implantable Lead Implant Date: 20180102
Implantable Lead Implant Date: 20180102
Implantable Lead Location: 753859
Implantable Lead Location: 753860
Implantable Lead Model: 5076
Implantable Pulse Generator Implant Date: 20180102
Lead Channel Impedance Value: 399 Ohm
Lead Channel Impedance Value: 456 Ohm
Lead Channel Impedance Value: 513 Ohm
Lead Channel Pacing Threshold Amplitude: 0.375 V
Lead Channel Pacing Threshold Amplitude: 0.875 V
Lead Channel Pacing Threshold Pulse Width: 0.4 ms
Lead Channel Pacing Threshold Pulse Width: 0.4 ms
Lead Channel Sensing Intrinsic Amplitude: 29.375 mV
Lead Channel Sensing Intrinsic Amplitude: 29.375 mV
Lead Channel Sensing Intrinsic Amplitude: 3.375 mV
Lead Channel Sensing Intrinsic Amplitude: 3.375 mV
Lead Channel Setting Pacing Amplitude: 1.5 V
Lead Channel Setting Pacing Amplitude: 2 V
Lead Channel Setting Pacing Pulse Width: 0.4 ms
Lead Channel Setting Sensing Sensitivity: 0.3 mV

## 2019-09-09 DIAGNOSIS — R791 Abnormal coagulation profile: Secondary | ICD-10-CM | POA: Diagnosis not present

## 2019-09-11 DIAGNOSIS — R791 Abnormal coagulation profile: Secondary | ICD-10-CM | POA: Diagnosis not present

## 2019-09-11 DIAGNOSIS — S81802A Unspecified open wound, left lower leg, initial encounter: Secondary | ICD-10-CM | POA: Diagnosis not present

## 2019-09-14 DIAGNOSIS — R791 Abnormal coagulation profile: Secondary | ICD-10-CM | POA: Diagnosis not present

## 2019-09-18 DIAGNOSIS — R791 Abnormal coagulation profile: Secondary | ICD-10-CM | POA: Diagnosis not present

## 2019-09-21 NOTE — Progress Notes (Signed)
Remote ICD transmission.   

## 2019-09-22 DIAGNOSIS — E11622 Type 2 diabetes mellitus with other skin ulcer: Secondary | ICD-10-CM | POA: Diagnosis not present

## 2019-09-22 DIAGNOSIS — L97822 Non-pressure chronic ulcer of other part of left lower leg with fat layer exposed: Secondary | ICD-10-CM | POA: Diagnosis not present

## 2019-09-23 DIAGNOSIS — R791 Abnormal coagulation profile: Secondary | ICD-10-CM | POA: Diagnosis not present

## 2019-09-28 DIAGNOSIS — R791 Abnormal coagulation profile: Secondary | ICD-10-CM | POA: Diagnosis not present

## 2019-09-29 DIAGNOSIS — L97822 Non-pressure chronic ulcer of other part of left lower leg with fat layer exposed: Secondary | ICD-10-CM | POA: Diagnosis not present

## 2019-09-29 DIAGNOSIS — E1151 Type 2 diabetes mellitus with diabetic peripheral angiopathy without gangrene: Secondary | ICD-10-CM | POA: Diagnosis not present

## 2019-09-29 DIAGNOSIS — M1711 Unilateral primary osteoarthritis, right knee: Secondary | ICD-10-CM | POA: Diagnosis not present

## 2019-09-29 DIAGNOSIS — E11622 Type 2 diabetes mellitus with other skin ulcer: Secondary | ICD-10-CM | POA: Diagnosis not present

## 2019-09-30 ENCOUNTER — Encounter: Payer: Self-pay | Admitting: Sports Medicine

## 2019-09-30 ENCOUNTER — Other Ambulatory Visit: Payer: Self-pay

## 2019-09-30 ENCOUNTER — Ambulatory Visit (INDEPENDENT_AMBULATORY_CARE_PROVIDER_SITE_OTHER): Payer: Medicare Other | Admitting: Sports Medicine

## 2019-09-30 ENCOUNTER — Other Ambulatory Visit: Payer: Self-pay | Admitting: Cardiology

## 2019-09-30 DIAGNOSIS — E1140 Type 2 diabetes mellitus with diabetic neuropathy, unspecified: Secondary | ICD-10-CM | POA: Diagnosis not present

## 2019-09-30 DIAGNOSIS — E1165 Type 2 diabetes mellitus with hyperglycemia: Secondary | ICD-10-CM

## 2019-09-30 DIAGNOSIS — M79675 Pain in left toe(s): Secondary | ICD-10-CM

## 2019-09-30 DIAGNOSIS — M79674 Pain in right toe(s): Secondary | ICD-10-CM

## 2019-09-30 DIAGNOSIS — IMO0002 Reserved for concepts with insufficient information to code with codable children: Secondary | ICD-10-CM

## 2019-09-30 DIAGNOSIS — B351 Tinea unguium: Secondary | ICD-10-CM

## 2019-09-30 NOTE — Progress Notes (Signed)
Subjective: Shannon Baxter is a 58 y.o. female patient with history of diabetes who presents to office today complaining of long, painful nails while ambulating in shoes; unable to trim. Patient states that the glucose reading this morning was 102mg /dl. Saw PCP Dr. 1 month ago, Last A1c 6.5. Patient denies any new changes in medication or new problems.   Patient Active Problem List   Diagnosis Date Noted  . Paroxysmal atrial flutter (HCC) 09/01/2018  . Chronic systolic congestive heart failure (HCC) 12/06/2016  . Morbid obesity (HCC) 12/06/2016  . Presence of automatic implantable cardioverter-defibrillator 10/23/2016  . Dilated cardiomyopathy (HCC) 09/03/2016  . Status post aortic valve replacement 03/29/2016  . Paroxysmal atrial fibrillation (HCC) 03/06/2016  . Rheumatic aortic valve insufficiency 03/05/2016  . Acute on chronic systolic heart failure (HCC) 02/21/2016  . Hypothyroidism 01/23/2016  . Aortic valve stenosis, rheumatic 11/22/2015  . Type 2 diabetes mellitus with diabetic peripheral angiopathy without gangrene, without long-term current use of insulin (HCC) 11/22/2015  . Dyspnea on exertion 10/26/2015  . Atherosclerosis of native arteries of extremities with intermittent claudication, bilateral legs (HCC) 08/04/2015  . Carotid atherosclerosis 07/26/2015  . Coronary artery disease involving native coronary artery of native heart without angina pectoris 07/26/2015  . Dyslipidemia 07/26/2015  . Essential hypertension 07/26/2015  . Intermittent claudication (HCC) 07/26/2015  . Peripheral vascular disease (HCC) 07/26/2015  . History of tobacco abuse 07/26/2015   Current Outpatient Medications on File Prior to Visit  Medication Sig Dispense Refill  . albuterol (PROVENTIL HFA;VENTOLIN HFA) 108 (90 Base) MCG/ACT inhaler Inhale 1-2 puffs into the lungs every 6 (six) hours as needed for wheezing or shortness of breath.    . ALPRAZolam (XANAX) 0.5 MG tablet Take 0.5 mg by mouth 3  (three) times daily as needed for anxiety.   0  . amiodarone (PACERONE) 200 MG tablet Take 200 mg by mouth daily after breakfast.   0  . atorvastatin (LIPITOR) 20 MG tablet Take 20 mg by mouth daily.    . carvedilol (COREG) 25 MG tablet TAKE 1 TABLET BY MOUTH TWICE A DAY 180 tablet 1  . clopidogrel (PLAVIX) 75 MG tablet Take 75 mg by mouth daily after breakfast.   6  . Cyanocobalamin (B-12) 500 MCG TABS Take 3 tablets by mouth daily.    09/25/2015 ENTRESTO 49-51 MG TAKE 1 TABLET BY MOUTH TWICE A DAY 60 tablet 10  . EPIPEN 2-PAK 0.3 MG/0.3ML SOAJ injection Inject 0.3 mg into the muscle once.   0  . ezetimibe (ZETIA) 10 MG tablet Take 10 mg by mouth daily.    . fluticasone (FLONASE) 50 MCG/ACT nasal spray Place 2 sprays into both nostrils daily as needed for allergies.     . fluticasone furoate-vilanterol (BREO ELLIPTA) 200-25 MCG/INH AEPB Inhale 1 puff into the lungs daily.    . furosemide (LASIX) 20 MG tablet Take 3 tablets (60 mg total) by mouth 2 (two) times daily. 180 tablet 1  . gabapentin (NEURONTIN) 300 MG capsule Take 300 mg by mouth 3 (three) times daily.   5  . glimepiride (AMARYL) 4 MG tablet Take 4 mg by mouth daily with breakfast.     . ipratropium-albuterol (DUONEB) 0.5-2.5 (3) MG/3ML SOLN Take 3 mLs by nebulization 2 (two) times daily.     Marland Kitchen levothyroxine (SYNTHROID, LEVOTHROID) 50 MCG tablet Take 50 mcg by mouth daily before breakfast.    . linaclotide (LINZESS) 72 MCG capsule Take 72 mcg by mouth daily as needed (constipation).    Marland Kitchen  loratadine (CLARITIN) 10 MG tablet Take 10 mg by mouth at bedtime.     . metFORMIN (GLUCOPHAGE) 500 MG tablet Take 500 mg by mouth 2 (two) times daily with a meal.     . metoprolol (TOPROL-XL) 200 MG 24 hr tablet Take 200 mg by mouth daily after breakfast.     . montelukast (SINGULAIR) 10 MG tablet Take 10 mg by mouth at bedtime.     . Multiple Vitamins-Minerals (CENTRUM SILVER ULTRA WOMENS) TABS Take 1 tablet by mouth daily.    Marland Kitchen  neomycin-polymyxin-hydrocortisone (CORTISPORIN) OTIC solution Apply twice daily to left 1st toenail 10 mL 0  . omeprazole (PRILOSEC) 20 MG capsule Take 20 mg by mouth at bedtime.     . Potassium Chloride ER 20 MEQ TBCR TAKE 1 TABLET BY MOUTH EVERY DAY 90 tablet 1  . Red Yeast Rice 600 MG TABS Take 2 tablets by mouth daily.    . rosuvastatin (CRESTOR) 10 MG tablet Take up to 1 tablet daily as tolerated 30 tablet 3  . warfarin (COUMADIN) 1 MG tablet Take 1 mg by mouth See admin instructions. Take 6 mg on Sunday, Tuesday, and  Thursday . Take 5 mg on all other days    . warfarin (COUMADIN) 5 MG tablet Take 5 mg by mouth See admin instructions. Take 6 mg on Sunday, Tuesday, and  Thursday . Take 5 mg on all other days     No current facility-administered medications on file prior to visit.    Allergies  Allergen Reactions  . Iodinated Diagnostic Agents Hives and Rash  . Ioxaglate Hives    Recent Results (from the past 2160 hour(s))  CUP PACEART REMOTE DEVICE CHECK     Status: None   Collection Time: 09/01/19 11:34 AM  Result Value Ref Range   Date Time Interrogation Session [id number]    Pulse Generator Manufacturer MERM    Pulse Gen Model [id number] Evera MRI XT DR    Pulse Gen Serial Number [id number]    Clinic Name [facility]    Implantable Pulse Generator Type Implantable Cardiac Defibulator    Implantable Pulse Generator Implant Date 95188416    Implantable Lead Manufacturer MERM    Implantable Lead Model 5076 CapSureFix Novus MRI SureScan    Implantable Lead Serial Number [id number]    Implantable Lead Implant Date 09323557    Implantable Lead Location Detail 1 UNKNOWN    Implantable Lead Location [id number]    Implantable Lead Manufacturer [facility]    Implantable Lead Model [id number] Sprint Quattro Secure S MRI SureScan    Implantable Lead Serial Number [id number]    Implantable Lead Implant Date 54270623    Implantable Lead Location Detail 1 UNKNOWN    Implantable Lead Location  [id number]    Lead Channel Setting Sensing Sensitivity 0.3 mV   Lead Channel Setting Pacing Amplitude 1.5 V   Lead Channel Setting Pacing Pulse Width 0.4 ms   Lead Channel Setting Pacing Amplitude 2 V   Lead Channel Impedance Value 513 ohm   Lead Channel Sensing Intrinsic Amplitude 3.375 mV   Lead Channel Sensing Intrinsic Amplitude 3.375 mV   Lead Channel Pacing Threshold Amplitude 0.375 V   Lead Channel Pacing Threshold Pulse Width 0.4 ms   Lead Channel Impedance Value 456 ohm   Lead Channel Impedance Value 399 ohm   Lead Channel Sensing Intrinsic Amplitude 29.375 mV   Lead Channel Sensing Intrinsic Amplitude 29.375 mV   Lead Channel Pacing Threshold Amplitude 0.875 V   Lead  Channel Pacing Threshold Pulse Width 0.4 ms   HighPow Impedance 78 ohm   Battery Status OK    Battery Remaining Longevity 104 mo   Battery Voltage 3.00 V   Brady Statistic RA Percent Paced 2.61 %   Brady Statistic RV Percent Paced 0.04 %   Brady Statistic AP VP Percent 0.01 %   Brady Statistic AS VP Percent 0.04 %   Brady Statistic AP VS Percent 2.61 %   Brady Statistic AS VS Percent 97.35 %    Objective: General: Patient is awake, alert, and oriented x 3 and in no acute distress.  Integument: Skin is warm, dry and supple bilateral. Nails are tender, long, thickened and dystrophic with subungual debris, consistent with onychomycosis, 1-5 bilateral. Left hallux nail appears to be growing out.  No signs of infection. No open lesions or preulcerative lesions present bilateral. Remaining integument unremarkable.  Vasculature:  Dorsalis Pedis pulse 1/4 bilateral. Posterior Tibial pulse  0/4 bilateral. Capillary fill time <3 sec 1-5 bilateral. Positive hair growth to the level of the digits.Temperature gradient within normal limits. No varicosities present bilateral. No edema present bilateral.   Neurology: The patient has intact sensation measured with a 5.07/10g Semmes Weinstein Monofilament at all pedal sites  bilateral. Vibratory sensation diminished bilateral with tuning fork. No Babinski sign present bilateral.   Musculoskeletal:Asymptomatic pes planus pedal deformities noted bilateral. Muscular strength 5/5 in all lower extremity muscular groups bilateral. No tenderness with calf compression bilateral.  Assessment and Plan: Problem List Items Addressed This Visit    None    Visit Diagnoses    Pain due to onychomycosis of toenails of both feet    -  Primary   Type 2 diabetes, uncontrolled, with neuropathy (HCC)         -Examined patient. -Discussed and educated patient on diabetic foot care, especially with  regards to the vascular, neurological and musculoskeletal systems.  -Mechanically debrided all nails 1-5 bilateral using sterile nail nipper and filed with dremel. -Patient to return  in 3 months for at risk foot care -Patient advised to call the office if any problems or questions arise in the meantime.  [name], DPM

## 2019-10-08 DIAGNOSIS — R791 Abnormal coagulation profile: Secondary | ICD-10-CM | POA: Diagnosis not present

## 2019-10-14 DIAGNOSIS — E1151 Type 2 diabetes mellitus with diabetic peripheral angiopathy without gangrene: Secondary | ICD-10-CM | POA: Diagnosis not present

## 2019-10-14 DIAGNOSIS — R791 Abnormal coagulation profile: Secondary | ICD-10-CM | POA: Diagnosis not present

## 2019-10-14 DIAGNOSIS — L97822 Non-pressure chronic ulcer of other part of left lower leg with fat layer exposed: Secondary | ICD-10-CM | POA: Diagnosis not present

## 2019-10-14 DIAGNOSIS — E11622 Type 2 diabetes mellitus with other skin ulcer: Secondary | ICD-10-CM | POA: Diagnosis not present

## 2019-10-20 ENCOUNTER — Other Ambulatory Visit: Payer: Self-pay | Admitting: Cardiology

## 2019-10-20 NOTE — Telephone Encounter (Signed)
°*  STAT* If patient is at the pharmacy, call can be transferred to refill team.   1. Which medications need to be refilled? (please list name of each medication and dose if known) Potassium cl er 107meq  2. Which pharmacy/location (including street and city if local pharmacy) is medication to be sent to?[id number]  3. Do they need a 30 day or 90 day supply? [place]

## 2019-10-21 DIAGNOSIS — I2510 Atherosclerotic heart disease of native coronary artery without angina pectoris: Secondary | ICD-10-CM | POA: Diagnosis not present

## 2019-10-21 DIAGNOSIS — Z954 Presence of other heart-valve replacement: Secondary | ICD-10-CM | POA: Diagnosis not present

## 2019-10-21 DIAGNOSIS — E119 Type 2 diabetes mellitus without complications: Secondary | ICD-10-CM | POA: Diagnosis not present

## 2019-10-21 DIAGNOSIS — I4891 Unspecified atrial fibrillation: Secondary | ICD-10-CM | POA: Diagnosis not present

## 2019-10-21 DIAGNOSIS — Z7902 Long term (current) use of antithrombotics/antiplatelets: Secondary | ICD-10-CM | POA: Diagnosis not present

## 2019-10-21 DIAGNOSIS — Z8673 Personal history of transient ischemic attack (TIA), and cerebral infarction without residual deficits: Secondary | ICD-10-CM | POA: Diagnosis not present

## 2019-10-21 DIAGNOSIS — R079 Chest pain, unspecified: Secondary | ICD-10-CM | POA: Diagnosis not present

## 2019-10-21 DIAGNOSIS — Z7901 Long term (current) use of anticoagulants: Secondary | ICD-10-CM | POA: Diagnosis not present

## 2019-10-21 DIAGNOSIS — Z95810 Presence of automatic (implantable) cardiac defibrillator: Secondary | ICD-10-CM | POA: Diagnosis not present

## 2019-10-21 DIAGNOSIS — R0789 Other chest pain: Secondary | ICD-10-CM | POA: Diagnosis not present

## 2019-10-21 DIAGNOSIS — Z743 Need for continuous supervision: Secondary | ICD-10-CM | POA: Diagnosis not present

## 2019-10-21 MED ORDER — POTASSIUM CHLORIDE ER 20 MEQ PO TBCR: 1.0000 | EXTENDED_RELEASE_TABLET | Freq: Every day | ORAL | 1 refills | Status: DC

## 2019-10-21 NOTE — Telephone Encounter (Signed)
Potassium 20 meq daily refilled.

## 2019-10-22 DIAGNOSIS — E118 Type 2 diabetes mellitus with unspecified complications: Secondary | ICD-10-CM | POA: Diagnosis not present

## 2019-10-22 DIAGNOSIS — E11622 Type 2 diabetes mellitus with other skin ulcer: Secondary | ICD-10-CM | POA: Diagnosis not present

## 2019-10-22 DIAGNOSIS — E1151 Type 2 diabetes mellitus with diabetic peripheral angiopathy without gangrene: Secondary | ICD-10-CM | POA: Diagnosis not present

## 2019-10-22 DIAGNOSIS — L97822 Non-pressure chronic ulcer of other part of left lower leg with fat layer exposed: Secondary | ICD-10-CM | POA: Diagnosis not present

## 2019-10-28 DIAGNOSIS — R791 Abnormal coagulation profile: Secondary | ICD-10-CM | POA: Diagnosis not present

## 2019-10-29 DIAGNOSIS — E1151 Type 2 diabetes mellitus with diabetic peripheral angiopathy without gangrene: Secondary | ICD-10-CM | POA: Diagnosis not present

## 2019-10-29 DIAGNOSIS — S81802A Unspecified open wound, left lower leg, initial encounter: Secondary | ICD-10-CM | POA: Diagnosis not present

## 2019-11-04 DIAGNOSIS — R791 Abnormal coagulation profile: Secondary | ICD-10-CM | POA: Diagnosis not present

## 2019-11-18 DIAGNOSIS — R791 Abnormal coagulation profile: Secondary | ICD-10-CM | POA: Diagnosis not present

## 2019-11-19 DIAGNOSIS — E1165 Type 2 diabetes mellitus with hyperglycemia: Secondary | ICD-10-CM | POA: Diagnosis not present

## 2019-11-19 DIAGNOSIS — E1140 Type 2 diabetes mellitus with diabetic neuropathy, unspecified: Secondary | ICD-10-CM | POA: Diagnosis not present

## 2019-11-19 DIAGNOSIS — Z79899 Other long term (current) drug therapy: Secondary | ICD-10-CM | POA: Diagnosis not present

## 2019-11-19 DIAGNOSIS — I110 Hypertensive heart disease with heart failure: Secondary | ICD-10-CM | POA: Diagnosis not present

## 2019-11-19 DIAGNOSIS — E785 Hyperlipidemia, unspecified: Secondary | ICD-10-CM | POA: Diagnosis not present

## 2019-11-19 DIAGNOSIS — E039 Hypothyroidism, unspecified: Secondary | ICD-10-CM | POA: Diagnosis not present

## 2019-11-19 DIAGNOSIS — I2510 Atherosclerotic heart disease of native coronary artery without angina pectoris: Secondary | ICD-10-CM | POA: Diagnosis not present

## 2019-11-22 ENCOUNTER — Other Ambulatory Visit: Payer: Self-pay | Admitting: Cardiology

## 2019-11-23 DIAGNOSIS — Z9181 History of falling: Secondary | ICD-10-CM | POA: Diagnosis not present

## 2019-11-23 DIAGNOSIS — E785 Hyperlipidemia, unspecified: Secondary | ICD-10-CM | POA: Diagnosis not present

## 2019-11-23 DIAGNOSIS — Z0000 Encounter for general adult medical examination without abnormal findings: Secondary | ICD-10-CM | POA: Diagnosis not present

## 2019-12-01 ENCOUNTER — Ambulatory Visit (INDEPENDENT_AMBULATORY_CARE_PROVIDER_SITE_OTHER): Payer: Medicare Other | Admitting: Cardiology

## 2019-12-01 ENCOUNTER — Encounter: Payer: Self-pay | Admitting: Cardiology

## 2019-12-01 ENCOUNTER — Other Ambulatory Visit: Payer: Self-pay

## 2019-12-01 ENCOUNTER — Ambulatory Visit (INDEPENDENT_AMBULATORY_CARE_PROVIDER_SITE_OTHER): Payer: Medicare Other | Admitting: *Deleted

## 2019-12-01 VITALS — BP 118/76 | HR 80 | Ht 72.0 in | Wt 352.0 lb

## 2019-12-01 DIAGNOSIS — Z95810 Presence of automatic (implantable) cardiac defibrillator: Secondary | ICD-10-CM | POA: Diagnosis not present

## 2019-12-01 DIAGNOSIS — Z952 Presence of prosthetic heart valve: Secondary | ICD-10-CM

## 2019-12-01 DIAGNOSIS — R6889 Other general symptoms and signs: Secondary | ICD-10-CM | POA: Diagnosis not present

## 2019-12-01 DIAGNOSIS — I2510 Atherosclerotic heart disease of native coronary artery without angina pectoris: Secondary | ICD-10-CM | POA: Diagnosis not present

## 2019-12-01 DIAGNOSIS — R0609 Other forms of dyspnea: Secondary | ICD-10-CM

## 2019-12-01 DIAGNOSIS — E785 Hyperlipidemia, unspecified: Secondary | ICD-10-CM | POA: Diagnosis not present

## 2019-12-01 DIAGNOSIS — I420 Dilated cardiomyopathy: Secondary | ICD-10-CM

## 2019-12-01 DIAGNOSIS — J020 Streptococcal pharyngitis: Secondary | ICD-10-CM | POA: Diagnosis not present

## 2019-12-01 DIAGNOSIS — R0600 Dyspnea, unspecified: Secondary | ICD-10-CM

## 2019-12-01 LAB — CUP PACEART REMOTE DEVICE CHECK
Battery Remaining Longevity: 100 mo
Battery Voltage: 3 V
Brady Statistic AP VP Percent: 0 %
Brady Statistic AP VS Percent: 1.9 %
Brady Statistic AS VP Percent: 0.03 %
Brady Statistic AS VS Percent: 98.06 %
Brady Statistic RA Percent Paced: 1.9 %
Brady Statistic RV Percent Paced: 0.04 %
Date Time Interrogation Session: 20210209012304
HighPow Impedance: 76 Ohm
Implantable Lead Implant Date: 20180102
Implantable Lead Implant Date: 20180102
Implantable Lead Location: 753859
Implantable Lead Location: 753860
Implantable Lead Model: 5076
Implantable Pulse Generator Implant Date: 20180102
Lead Channel Impedance Value: 399 Ohm
Lead Channel Impedance Value: 494 Ohm
Lead Channel Impedance Value: 494 Ohm
Lead Channel Pacing Threshold Amplitude: 0.375 V
Lead Channel Pacing Threshold Amplitude: 0.875 V
Lead Channel Pacing Threshold Pulse Width: 0.4 ms
Lead Channel Pacing Threshold Pulse Width: 0.4 ms
Lead Channel Sensing Intrinsic Amplitude: 28.625 mV
Lead Channel Sensing Intrinsic Amplitude: 28.625 mV
Lead Channel Sensing Intrinsic Amplitude: 3.375 mV
Lead Channel Sensing Intrinsic Amplitude: 3.375 mV
Lead Channel Setting Pacing Amplitude: 1.5 V
Lead Channel Setting Pacing Amplitude: 2 V
Lead Channel Setting Pacing Pulse Width: 0.4 ms
Lead Channel Setting Sensing Sensitivity: 0.3 mV

## 2019-12-01 NOTE — Progress Notes (Signed)
Cardiology Office Note:    Date:  12/01/2019   ID:  Shannon Baxter, DOB 07-30-1961, MRN [id number]  PCP:  Paulina Fusi, MD  Cardiologist:  Gypsy Balsam, MD    Referring MD: Paulina Fusi, MD   Chief Complaint  Patient presents with  . Follow-up    3 Months    History of Present Illness:    Shannon Baxter is a 59 y.o. female complex past medical history history of aortic stenosis that required aortic valve replacement with mechanical prosthesis, also essential hypertension and cardiomyopathy with ejection fraction 30 to 35%, diabetes.  Comes today 2 months for follow-up will doing well.  Denies having any chest pain tightness squeezing pressure burning chest, shortness of breath this is usual.  No new findings.  No discharges from the defibrillator  Past Medical History:  Diagnosis Date  . Allergy   . Aortic insufficiency   . Aortic stenosis   . Arthritis   . Asthma   . CHF (congestive heart failure) (HCC)   . Claudication (HCC)   . Coronary artery disease   . Diabetes mellitus without complication (HCC)   . GERD (gastroesophageal reflux disease)   . Heart murmur   . Hyperlipidemia   . Hypertension   . Nutritional and metabolic cardiomyopathy (HCC)   . PVD (peripheral vascular disease) (HCC)   . Sleep apnea   . Stroke [facility])     Past Surgical History:  Procedure Laterality Date  . A-FLUTTER ABLATION N/A 11/05/2018   Procedure: A-FLUTTER ABLATION;  Surgeon: Regan Lemming, MD;  Location: [facility] INVASIVE CV LAB;  Service: Cardiovascular;  Laterality: N/A;  . CARDIAC CATHETERIZATION    . INSERT / REPLACE / REMOVE PACEMAKER     Medtronic ICD  . MECHANICAL AORTIC VALVE REPLACEMENT    . PERIPHERAL ARTERIAL STENT GRAFT    . TUBAL LIGATION      Current Medications: Current Meds  Medication Sig  . albuterol (PROVENTIL HFA;VENTOLIN HFA) 108 (90 Base) MCG/ACT inhaler Inhale 1-2 puffs into the lungs every 6 (six) hours as needed for wheezing or shortness of breath.    . ALPRAZolam (XANAX) 0.5 MG tablet Take 0.5 mg by mouth 3 (three) times daily as needed for anxiety.   Marland Kitchen amiodarone (PACERONE) 200 MG tablet Take 200 mg by mouth daily after breakfast.   . atorvastatin (LIPITOR) 20 MG tablet Take 20 mg by mouth daily.  . carvedilol (COREG) 25 MG tablet TAKE 1 TABLET BY MOUTH TWICE A DAY  . clopidogrel (PLAVIX) 75 MG tablet Take 75 mg by mouth daily after breakfast.   . Cyanocobalamin (B-12) 500 MCG TABS Take 3 tablets by mouth daily.  Marland Kitchen ENTRESTO 49-51 MG TAKE 1 TABLET BY MOUTH TWICE A DAY  . EPIPEN 2-PAK 0.3 MG/0.3ML SOAJ injection Inject 0.3 mg into the muscle once.   . ezetimibe (ZETIA) 10 MG tablet Take 10 mg by mouth daily.  . fluticasone (FLONASE) 50 MCG/ACT nasal spray Place 2 sprays into both nostrils daily as needed for allergies.   . fluticasone furoate-vilanterol (BREO ELLIPTA) 200-25 MCG/INH AEPB Inhale 1 puff into the lungs daily.  . furosemide (LASIX) 20 MG tablet Take 3 tablets (60 mg total) by mouth 2 (two) times daily.  Marland Kitchen gabapentin (NEURONTIN) 300 MG capsule Take 300 mg by mouth 3 (three) times daily.   Marland Kitchen glimepiride (AMARYL) 4 MG tablet Take 4 mg by mouth daily with breakfast.   . ipratropium-albuterol (DUONEB) 0.5-2.5 (3) MG/3ML SOLN Take 3 mLs by nebulization  2 (two) times daily.   Marland Kitchen levothyroxine (SYNTHROID, LEVOTHROID) 50 MCG tablet Take 50 mcg by mouth daily before breakfast.  . linaclotide (LINZESS) 72 MCG capsule Take 72 mcg by mouth daily as needed (constipation).  Marland Kitchen loratadine (CLARITIN) 10 MG tablet Take 10 mg by mouth at bedtime.   . metFORMIN (GLUCOPHAGE) 500 MG tablet Take 500 mg by mouth 2 (two) times daily with a meal.   . metoprolol (TOPROL-XL) 200 MG 24 hr tablet Take 200 mg by mouth daily after breakfast.   . montelukast (SINGULAIR) 10 MG tablet Take 10 mg by mouth at bedtime.   . Multiple Vitamins-Minerals (CENTRUM SILVER ULTRA WOMENS) TABS Take 1 tablet by mouth daily.  Marland Kitchen neomycin-polymyxin-hydrocortisone (CORTISPORIN)  OTIC solution Apply twice daily to left 1st toenail  . omeprazole (PRILOSEC) 20 MG capsule Take 20 mg by mouth at bedtime.   . Potassium Chloride ER 20 MEQ TBCR Take 1 tablet by mouth daily.  . Red Yeast Rice 600 MG TABS Take 2 tablets by mouth daily.  . rosuvastatin (CRESTOR) 10 MG tablet TAKE UP TO 1 TABLET DAILY AS TOLERATED  . warfarin (COUMADIN) 1 MG tablet Take 1 mg by mouth See admin instructions. Take 6 mg on Sunday, Tuesday, and  Thursday . Take 5 mg on all other days  . warfarin (COUMADIN) 5 MG tablet Take 5 mg by mouth See admin instructions. Take 6 mg on Sunday, Tuesday, and  Thursday . Take 5 mg on all other days     Allergies:   Iodinated diagnostic agents and Ioxaglate   Social History   Socioeconomic History  . Marital status: Single    Spouse name: Not on file  . Number of children: Not on file  . Years of education: Not on file  . Highest education level: Not on file  Occupational History  . Not on file  Tobacco Use  . Smoking status: Former Research scientist (life sciences)  . Smokeless tobacco: Never Used  Substance and Sexual Activity  . Alcohol use: No  . Drug use: No  . Sexual activity: Not on file  Other Topics Concern  . Not on file  Social History Narrative  . Not on file   Social Determinants of Health   Financial Resource Strain:   . Difficulty of Paying Living Expenses: Not on file  Food Insecurity:   . Worried About Charity fundraiser in the Last Year: Not on file  . Ran Out of Food in the Last Year: Not on file  Transportation Needs: No Transportation Needs  . Lack of Transportation (Medical): No  . Lack of Transportation (Non-Medical): No  Physical Activity:   . Days of Exercise per Week: Not on file  . Minutes of Exercise per Session: Not on file  Stress:   . Feeling of Stress : Not on file  Social Connections:   . Frequency of Communication with Friends and Family: Not on file  . Frequency of Social Gatherings with Friends and Family: Not on file  . Attends  Religious Services: Not on file  . Active Member of Clubs or Organizations: Not on file  . Attends Archivist Meetings: Not on file  . Marital Status: Not on file     Family History: The patient's family history includes Heart disease in her brother. ROS:   Please see the history of present illness.    All 14 point review of systems negative except as described per history of present illness  EKGs/Labs/Other Studies Reviewed:  Recent Labs: 12/29/2018: Magnesium 2.1 06/24/2019: BUN 24; Creatinine, Ser 1.10; Potassium 4.3; Sodium 144  Recent Lipid Panel    Component Value Date/Time   CHOL  07/04/2007 0815    200        ATP III CLASSIFICATION:  <200     mg/dL   Desirable  675-449  mg/dL   Borderline High  >=201    mg/dL   High   TRIG 007 10/10/7587 0815   HDL 47 07/04/2007 0815   CHOLHDL 4.3 07/04/2007 0815   VLDL 28 07/04/2007 0815   LDLCALC (H) 07/04/2007 0815    125        Total Cholesterol/HDL:CHD Risk Coronary Heart Disease Risk Table                     Men   Women  1/2 Average Risk   3.4   3.3    Physical Exam:    VS:  BP 118/76   Pulse 80   Ht 6' (1.829 m)   Wt (!) 352 lb (159.7 kg)   SpO2 95%   BMI 47.74 kg/m     Wt Readings from Last 3 Encounters:  12/01/19 (!) 352 lb (159.7 kg)  08/31/19 (!) 345 lb (156.5 kg)  06/24/19 (!) 339 lb (153.8 kg)     GEN:  Well nourished, well developed in no acute distress HEENT: Normal NECK: No JVD; No carotid bruits LYMPHATICS: No lymphadenopathy CARDIAC: RRR, mechanical aortic valve sounds are crisp.  There is systolic ejection murmur grade 1/6 best heard right upper portion of the sternum, no rubs, no gallops RESPIRATORY:  Clear to auscultation without rales, wheezing or rhonchi  ABDOMEN: Soft, non-tender, non-distended MUSCULOSKELETAL:  No edema; No deformity  SKIN: Warm and dry LOWER EXTREMITIES: no swelling NEUROLOGIC:  Alert and oriented x 3 PSYCHIATRIC:  Normal affect   ASSESSMENT:    1.  Status post aortic valve replacement   2. Presence of automatic implantable cardioverter-defibrillator   3. Dyspnea on exertion   4. Dyslipidemia   5. Coronary artery disease involving native coronary artery of native heart without angina pectoris   6. Dilated cardiomyopathy (HCC)    PLAN:    In order of problems listed above:  1. Status post aortic valve replacement last assessment done in October 2020 showed peak gradient across aortic valve of 29 and mean of 14.5.  Dimensional index was 0.24.  Overall she is doing well and denies having any new symptoms. 2. ICD present last interrogation reviewed, showed normal function no discharges, good battery status OptiVol showed normal impedance, however there is more atrial fibrillation noted by interrogation. 3. Cardiomyopathy she is on appropriate medications surprisingly she still have on the list metoprolol as well as carvedilol.  We will stop her metoprolol, will call pharmacy to make sure that she is taking carvedilol.  Overall hemodynamically compensated 4. Dyslipidemia she is on Lipitor 20 which I will continue 5. Atrial fibrillation looks like she got more episode with already.  Anticoagulated which I will continue.  We will continue with amiodarone, I will temporarily increase dose of amiodarone and then bring her back to interrogate her device.  Medication Adjustments/Labs and Tests Ordered: Current medicines are reviewed at length with the patient today.  Concerns regarding medicines are outlined above.  No orders of the defined types were placed in this encounter.  Medication changes: No orders of the defined types were placed in this encounter.   Signed, Georgeanna Lea, MD,  [facility] 12/01/2019 1:38 PM    [facility]

## 2019-12-01 NOTE — Patient Instructions (Signed)
Medication Instructions:  Your physician has recommended you make the following change in your medication: You need to stop taking the Metoprolol. *If you need a refill on your cardiac medications before your next appointment, please call your pharmacy*  Lab Work: You had a Pro BNP and BMET today in the office. If you have labs (blood work) drawn today and your tests are completely normal, you will receive your results only by: Marland Kitchen MyChart Message (if you have MyChart) OR . A paper copy in the mail If you have any lab test that is abnormal or we need to change your treatment, we will call you to review the results.  Testing/Procedures: None ordered  Follow-Up: At [facility], you and your health needs are our priority.  As part of our continuing mission to provide you with exceptional heart care, we have created designated Provider Care Teams.  These Care Teams include your primary Cardiologist (physician) and Advanced Practice Providers (APPs -  Physician Assistants and Nurse Practitioners) who all work together to provide you with the care you need, when you need it.  Your next appointment:   3 month(s)  The format for your next appointment:   In Person  Provider:   Gypsy Balsam, MD  Other Instructions NA

## 2019-12-02 LAB — BASIC METABOLIC PANEL
BUN/Creatinine Ratio: 12 (ref 9–23)
BUN: 12 mg/dL (ref 6–24)
CO2: 21 mmol/L (ref 20–29)
Calcium: 8.8 mg/dL (ref 8.7–10.2)
Chloride: 104 mmol/L (ref 96–106)
Creatinine, Ser: 1.01 mg/dL — ABNORMAL HIGH (ref 0.57–1.00)
GFR calc Af Amer: 71 mL/min/{1.73_m2} (ref >59)
GFR calc non Af Amer: 62 mL/min/{1.73_m2} (ref >59)
Glucose: 57 mg/dL — ABNORMAL LOW (ref 65–99)
Potassium: 4.2 mmol/L (ref 3.5–5.2)
Sodium: 145 mmol/L — ABNORMAL HIGH (ref 134–144)

## 2019-12-02 LAB — PRO B NATRIURETIC PEPTIDE: NT-Pro BNP: 209 pg/mL (ref 0–287)

## 2019-12-02 NOTE — Progress Notes (Signed)
ICD Remote  

## 2019-12-04 DIAGNOSIS — Z955 Presence of coronary angioplasty implant and graft: Secondary | ICD-10-CM | POA: Diagnosis not present

## 2019-12-04 DIAGNOSIS — K219 Gastro-esophageal reflux disease without esophagitis: Secondary | ICD-10-CM | POA: Diagnosis not present

## 2019-12-04 DIAGNOSIS — I2510 Atherosclerotic heart disease of native coronary artery without angina pectoris: Secondary | ICD-10-CM | POA: Diagnosis not present

## 2019-12-04 DIAGNOSIS — Z79899 Other long term (current) drug therapy: Secondary | ICD-10-CM | POA: Diagnosis not present

## 2019-12-04 DIAGNOSIS — I4891 Unspecified atrial fibrillation: Secondary | ICD-10-CM | POA: Diagnosis not present

## 2019-12-04 DIAGNOSIS — I110 Hypertensive heart disease with heart failure: Secondary | ICD-10-CM | POA: Diagnosis not present

## 2019-12-04 DIAGNOSIS — J029 Acute pharyngitis, unspecified: Secondary | ICD-10-CM | POA: Diagnosis not present

## 2019-12-04 DIAGNOSIS — J45909 Unspecified asthma, uncomplicated: Secondary | ICD-10-CM | POA: Diagnosis not present

## 2019-12-04 DIAGNOSIS — T45515A Adverse effect of anticoagulants, initial encounter: Secondary | ICD-10-CM | POA: Diagnosis not present

## 2019-12-04 DIAGNOSIS — Z8673 Personal history of transient ischemic attack (TIA), and cerebral infarction without residual deficits: Secondary | ICD-10-CM | POA: Diagnosis not present

## 2019-12-04 DIAGNOSIS — R791 Abnormal coagulation profile: Secondary | ICD-10-CM | POA: Diagnosis not present

## 2019-12-04 DIAGNOSIS — I509 Heart failure, unspecified: Secondary | ICD-10-CM | POA: Diagnosis not present

## 2019-12-04 DIAGNOSIS — Z7901 Long term (current) use of anticoagulants: Secondary | ICD-10-CM | POA: Diagnosis not present

## 2019-12-07 DIAGNOSIS — R791 Abnormal coagulation profile: Secondary | ICD-10-CM | POA: Diagnosis not present

## 2019-12-14 DIAGNOSIS — R791 Abnormal coagulation profile: Secondary | ICD-10-CM | POA: Diagnosis not present

## 2019-12-17 DIAGNOSIS — J301 Allergic rhinitis due to pollen: Secondary | ICD-10-CM | POA: Diagnosis not present

## 2019-12-17 DIAGNOSIS — G4733 Obstructive sleep apnea (adult) (pediatric): Secondary | ICD-10-CM | POA: Diagnosis not present

## 2019-12-17 DIAGNOSIS — J4530 Mild persistent asthma, uncomplicated: Secondary | ICD-10-CM | POA: Diagnosis not present

## 2019-12-21 DIAGNOSIS — E039 Hypothyroidism, unspecified: Secondary | ICD-10-CM | POA: Diagnosis not present

## 2019-12-21 DIAGNOSIS — R791 Abnormal coagulation profile: Secondary | ICD-10-CM | POA: Diagnosis not present

## 2019-12-24 ENCOUNTER — Other Ambulatory Visit: Payer: Self-pay | Admitting: Cardiology

## 2019-12-30 ENCOUNTER — Other Ambulatory Visit: Payer: Self-pay

## 2019-12-30 ENCOUNTER — Encounter: Payer: Self-pay | Admitting: Sports Medicine

## 2019-12-30 ENCOUNTER — Ambulatory Visit (INDEPENDENT_AMBULATORY_CARE_PROVIDER_SITE_OTHER): Payer: Medicare Other | Admitting: Sports Medicine

## 2019-12-30 DIAGNOSIS — B351 Tinea unguium: Secondary | ICD-10-CM | POA: Diagnosis not present

## 2019-12-30 DIAGNOSIS — E1165 Type 2 diabetes mellitus with hyperglycemia: Secondary | ICD-10-CM | POA: Diagnosis not present

## 2019-12-30 DIAGNOSIS — IMO0002 Reserved for concepts with insufficient information to code with codable children: Secondary | ICD-10-CM

## 2019-12-30 DIAGNOSIS — E1140 Type 2 diabetes mellitus with diabetic neuropathy, unspecified: Secondary | ICD-10-CM

## 2019-12-30 DIAGNOSIS — M79675 Pain in left toe(s): Secondary | ICD-10-CM | POA: Diagnosis not present

## 2019-12-30 DIAGNOSIS — M79674 Pain in right toe(s): Secondary | ICD-10-CM | POA: Diagnosis not present

## 2019-12-30 NOTE — Progress Notes (Signed)
Subjective: Shannon Baxter is a 59 y.o. female patient with history of diabetes who presents to office today complaining of long, painful nails while ambulating in shoes; unable to trim. Patient states that the glucose reading this morning was not recorded but yesterday was 100 last A1c 6.1 and saw PCP Dr. Tomasa Blase 2 weeks ago. Patient denies any new changes in medication or new problems.   Patient Active Problem List   Diagnosis Date Noted  . Paroxysmal atrial flutter (HCC) 09/01/2018  . Chronic systolic congestive heart failure (HCC) 12/06/2016  . Morbid obesity (HCC) 12/06/2016  . Presence of automatic implantable cardioverter-defibrillator 10/23/2016  . Dilated cardiomyopathy (HCC) 09/03/2016  . Status post aortic valve replacement 03/29/2016  . Paroxysmal atrial fibrillation (HCC) 03/06/2016  . Rheumatic aortic valve insufficiency 03/05/2016  . Acute on chronic systolic heart failure (HCC) 02/21/2016  . Hypothyroidism 01/23/2016  . Aortic valve stenosis, rheumatic 11/22/2015  . Type 2 diabetes mellitus with diabetic peripheral angiopathy without gangrene, without long-term current use of insulin (HCC) 11/22/2015  . Dyspnea on exertion 10/26/2015  . Atherosclerosis of native arteries of extremities with intermittent claudication, bilateral legs (HCC) 08/04/2015  . Carotid atherosclerosis 07/26/2015  . Coronary artery disease involving native coronary artery of native heart without angina pectoris 07/26/2015  . Dyslipidemia 07/26/2015  . Essential hypertension 07/26/2015  . Intermittent claudication (HCC) 07/26/2015  . Peripheral vascular disease (HCC) 07/26/2015  . History of tobacco abuse 07/26/2015   Current Outpatient Medications on File Prior to Visit  Medication Sig Dispense Refill  . albuterol (PROVENTIL HFA;VENTOLIN HFA) 108 (90 Base) MCG/ACT inhaler Inhale 1-2 puffs into the lungs every 6 (six) hours as needed for wheezing or shortness of breath.    . ALPRAZolam (XANAX) 0.5 MG  tablet Take 0.5 mg by mouth 3 (three) times daily as needed for anxiety.   0  . amiodarone (PACERONE) 200 MG tablet Take 200 mg by mouth daily after breakfast.   0  . atorvastatin (LIPITOR) 20 MG tablet Take 20 mg by mouth daily.    . carvedilol (COREG) 25 MG tablet TAKE 1 TABLET BY MOUTH TWICE A DAY 180 tablet 1  . clopidogrel (PLAVIX) 75 MG tablet Take 75 mg by mouth daily after breakfast.   6  . Cyanocobalamin (B-12) 500 MCG TABS Take 3 tablets by mouth daily.    Marland Kitchen ENTRESTO 49-51 MG TAKE 1 TABLET BY MOUTH TWICE A DAY 60 tablet 10  . EPIPEN 2-PAK 0.3 MG/0.3ML SOAJ injection Inject 0.3 mg into the muscle once.   0  . ezetimibe (ZETIA) 10 MG tablet Take 10 mg by mouth daily.    . fluticasone (FLONASE) 50 MCG/ACT nasal spray Place 2 sprays into both nostrils daily as needed for allergies.     . fluticasone furoate-vilanterol (BREO ELLIPTA) 200-25 MCG/INH AEPB Inhale 1 puff into the lungs daily.    . furosemide (LASIX) 20 MG tablet TAKE 3 TABLETS (60 MG TOTAL) BY MOUTH 2 (TWO) TIMES DAILY. 180 tablet 5  . gabapentin (NEURONTIN) 300 MG capsule Take 300 mg by mouth 3 (three) times daily.   5  . glimepiride (AMARYL) 4 MG tablet Take 4 mg by mouth daily with breakfast.     . ipratropium-albuterol (DUONEB) 0.5-2.5 (3) MG/3ML SOLN Take 3 mLs by nebulization 2 (two) times daily.     Marland Kitchen levothyroxine (SYNTHROID) 88 MCG tablet Take 88 mcg by mouth daily.    Marland Kitchen levothyroxine (SYNTHROID, LEVOTHROID) 50 MCG tablet Take 50 mcg by mouth daily before breakfast.    .  linaclotide (LINZESS) 72 MCG capsule Take 72 mcg by mouth daily as needed (constipation).    Marland Kitchen loratadine (CLARITIN) 10 MG tablet Take 10 mg by mouth at bedtime.     . metFORMIN (GLUCOPHAGE) 500 MG tablet Take 500 mg by mouth 2 (two) times daily with a meal.     . montelukast (SINGULAIR) 10 MG tablet Take 10 mg by mouth at bedtime.     . Multiple Vitamins-Minerals (CENTRUM SILVER ULTRA WOMENS) TABS Take 1 tablet by mouth daily.    Marland Kitchen  neomycin-polymyxin-hydrocortisone (CORTISPORIN) OTIC solution Apply twice daily to left 1st toenail 10 mL 0  . omeprazole (PRILOSEC) 20 MG capsule Take 20 mg by mouth at bedtime.     . Potassium Chloride ER 20 MEQ TBCR Take 1 tablet by mouth daily. 90 tablet 1  . Red Yeast Rice 600 MG TABS Take 2 tablets by mouth daily.    . rosuvastatin (CRESTOR) 10 MG tablet TAKE UP TO 1 TABLET DAILY AS TOLERATED 90 tablet 1  . warfarin (COUMADIN) 1 MG tablet Take 1 mg by mouth See admin instructions. Take 6 mg on Sunday, Tuesday, and  Thursday . Take 5 mg on all other days    . warfarin (COUMADIN) 5 MG tablet Take 5 mg by mouth See admin instructions. Take 6 mg on Sunday, Tuesday, and  Thursday . Take 5 mg on all other days     No current facility-administered medications on file prior to visit.   Allergies  Allergen Reactions  . Iodinated Diagnostic Agents Hives and Rash  . Ioxaglate Hives    Recent Results (from the past 2160 hour(s))  CUP PACEART REMOTE DEVICE CHECK     Status: None   Collection Time: 12/01/19  1:23 AM  Result Value Ref Range   Date Time Interrogation Session [id number]    Pulse Generator Manufacturer MERM    Pulse Gen Model [id number] Evera MRI XT DR    Pulse Gen Serial Number [id number]    Clinic Name [facility]    Implantable Pulse Generator Type Implantable Cardiac Defibulator    Implantable Pulse Generator Implant Date 41937902    Implantable Lead Manufacturer MERM    Implantable Lead Model 5076 CapSureFix Novus MRI SureScan    Implantable Lead Serial Number [id number]    Implantable Lead Implant Date 92426834    Implantable Lead Location Detail 1 UNKNOWN    Implantable Lead Location [id number]    Implantable Lead Manufacturer [facility]    Implantable Lead Model [phone] Sprint Quattro Secure S MRI SureScan    Implantable Lead Serial Number [id number]    Implantable Lead Implant Date 29798921    Implantable Lead Location Detail 1 UNKNOWN    Implantable Lead Location [id number]     Lead Channel Setting Sensing Sensitivity 0.3 mV   Lead Channel Setting Pacing Amplitude 1.5 V   Lead Channel Setting Pacing Pulse Width 0.4 ms   Lead Channel Setting Pacing Amplitude 2 V   Lead Channel Impedance Value 494 ohm   Lead Channel Sensing Intrinsic Amplitude 3.375 mV   Lead Channel Sensing Intrinsic Amplitude 3.375 mV   Lead Channel Pacing Threshold Amplitude 0.375 V   Lead Channel Pacing Threshold Pulse Width 0.4 ms   Lead Channel Impedance Value 494 ohm   Lead Channel Impedance Value 399 ohm   Lead Channel Sensing Intrinsic Amplitude 28.625 mV   Lead Channel Sensing Intrinsic Amplitude 28.625 mV   Lead Channel Pacing Threshold Amplitude 0.875 V   Lead Channel Pacing Threshold  Pulse Width 0.4 ms   HighPow Impedance 76 ohm   Battery Status OK    Battery Remaining Longevity 100 mo   Battery Voltage 3.00 V   Brady Statistic RA Percent Paced 1.9 %   Brady Statistic RV Percent Paced 0.04 %   Brady Statistic AP VP Percent 0 %   Brady Statistic AS VP Percent 0.03 %   Brady Statistic AP VS Percent 1.9 %   Brady Statistic AS VS Percent 98.06 %  Basic Metabolic Panel (BMET)     Status: Abnormal   Collection Time: 12/01/19  1:51 PM  Result Value Ref Range   Glucose 57 (L) 65 - 99 mg/dL   BUN 12 6 - 24 mg/dL   Creatinine, Ser 1.61 (H) 0.57 - 1.00 mg/dL   GFR calc non Af Amer 62 >59 mL/min/1.73   GFR calc Af Amer 71 >59 mL/min/1.73   BUN/Creatinine Ratio 12 9 - 23   Sodium 145 (H) 134 - 144 mmol/L   Potassium 4.2 3.5 - 5.2 mmol/L   Chloride 104 96 - 106 mmol/L   CO2 21 20 - 29 mmol/L   Calcium 8.8 8.7 - 10.2 mg/dL  Pro b natriuretic peptide     Status: None   Collection Time: 12/01/19  1:51 PM  Result Value Ref Range   NT-Pro BNP 209 0 - 287 pg/mL    Comment: The following cut-points have been suggested for the use of proBNP for the diagnostic evaluation of heart failure (HF) in patients with acute dyspnea: Modality                     Age           Optimal Cut                             (years)            Point ------------------------------------------------------ Diagnosis (rule in HF)        <50            450 pg/mL                           50 - 75            900 pg/mL                               >75           1800 pg/mL Exclusion (rule out HF)  Age independent     300 pg/mL     Objective: General: Patient is awake, alert, and oriented x 3 and in no acute distress.  Integument: Skin is warm, dry and supple bilateral. Nails are tender, long, thickened and dystrophic with subungual debris, consistent with onychomycosis, 1-5 bilateral.  No signs of infection. No open lesions or preulcerative lesions present bilateral. Remaining integument unremarkable.  Vasculature:  Dorsalis Pedis pulse 1/4 bilateral. Posterior Tibial pulse  0/4 bilateral. Capillary fill time <3 sec 1-5 bilateral. Positive hair growth to the level of the digits.Temperature gradient within normal limits. No varicosities present bilateral. No edema present bilateral.   Neurology: The patient has intact sensation measured with a 5.07/10g Semmes Weinstein Monofilament at all pedal sites bilateral. Vibratory sensation diminished bilateral with tuning fork. No Babinski sign present bilateral.   Musculoskeletal:Asymptomatic pes planus  pedal deformities noted bilateral. Muscular strength 5/5 in all lower extremity muscular groups bilateral. No tenderness with calf compression bilateral.  Assessment and Plan: Problem List Items Addressed This Visit    None    Visit Diagnoses    Pain due to onychomycosis of toenails of both feet    -  Primary   Type 2 diabetes, uncontrolled, with neuropathy ([place])         -Examined patient. -Re-Discussed and educated patient on diabetic foot care, especially with  regards to the vascular, neurological and musculoskeletal systems.  -Mechanically debrided all nails 1-5 bilateral using sterile nail nipper and filed with dremel.  Medicated small nick at right  hallux medial border with Lumicain -Patient to return  in 3 months for at risk foot care -Patient advised to call the office if any problems or questions arise in the meantime.  Landis Martins, DPM

## 2020-01-04 DIAGNOSIS — J019 Acute sinusitis, unspecified: Secondary | ICD-10-CM | POA: Diagnosis not present

## 2020-01-04 DIAGNOSIS — B9689 Other specified bacterial agents as the cause of diseases classified elsewhere: Secondary | ICD-10-CM | POA: Diagnosis not present

## 2020-01-04 DIAGNOSIS — J029 Acute pharyngitis, unspecified: Secondary | ICD-10-CM | POA: Diagnosis not present

## 2020-01-04 DIAGNOSIS — R791 Abnormal coagulation profile: Secondary | ICD-10-CM | POA: Diagnosis not present

## 2020-01-04 DIAGNOSIS — K5909 Other constipation: Secondary | ICD-10-CM | POA: Diagnosis not present

## 2020-01-05 DIAGNOSIS — G4733 Obstructive sleep apnea (adult) (pediatric): Secondary | ICD-10-CM | POA: Diagnosis not present

## 2020-01-11 DIAGNOSIS — R791 Abnormal coagulation profile: Secondary | ICD-10-CM | POA: Diagnosis not present

## 2020-01-18 DIAGNOSIS — R791 Abnormal coagulation profile: Secondary | ICD-10-CM | POA: Diagnosis not present

## 2020-01-25 DIAGNOSIS — R791 Abnormal coagulation profile: Secondary | ICD-10-CM | POA: Diagnosis not present

## 2020-01-25 DIAGNOSIS — E039 Hypothyroidism, unspecified: Secondary | ICD-10-CM | POA: Diagnosis not present

## 2020-02-09 DIAGNOSIS — R791 Abnormal coagulation profile: Secondary | ICD-10-CM | POA: Diagnosis not present

## 2020-02-16 DIAGNOSIS — R791 Abnormal coagulation profile: Secondary | ICD-10-CM | POA: Diagnosis not present

## 2020-03-01 ENCOUNTER — Ambulatory Visit (INDEPENDENT_AMBULATORY_CARE_PROVIDER_SITE_OTHER): Payer: Medicare Other | Admitting: *Deleted

## 2020-03-01 DIAGNOSIS — I420 Dilated cardiomyopathy: Secondary | ICD-10-CM | POA: Diagnosis not present

## 2020-03-01 DIAGNOSIS — R791 Abnormal coagulation profile: Secondary | ICD-10-CM | POA: Diagnosis not present

## 2020-03-01 LAB — CUP PACEART REMOTE DEVICE CHECK
Battery Remaining Longevity: 96 mo
Battery Voltage: 3 V
Brady Statistic AP VP Percent: 0 %
Brady Statistic AP VS Percent: 0.7 %
Brady Statistic AS VP Percent: 0.04 %
Brady Statistic AS VS Percent: 99.26 %
Brady Statistic RA Percent Paced: 0.71 %
Brady Statistic RV Percent Paced: 0.04 %
Date Time Interrogation Session: 20210511043823
HighPow Impedance: 78 Ohm
Implantable Lead Implant Date: 20180102
Implantable Lead Implant Date: 20180102
Implantable Lead Location: 753859
Implantable Lead Location: 753860
Implantable Lead Model: 5076
Implantable Pulse Generator Implant Date: 20180102
Lead Channel Impedance Value: 380 Ohm
Lead Channel Impedance Value: 437 Ohm
Lead Channel Impedance Value: 494 Ohm
Lead Channel Pacing Threshold Amplitude: 0.375 V
Lead Channel Pacing Threshold Amplitude: 1 V
Lead Channel Pacing Threshold Pulse Width: 0.4 ms
Lead Channel Pacing Threshold Pulse Width: 0.4 ms
Lead Channel Sensing Intrinsic Amplitude: 17.75 mV
Lead Channel Sensing Intrinsic Amplitude: 17.75 mV
Lead Channel Sensing Intrinsic Amplitude: 3 mV
Lead Channel Sensing Intrinsic Amplitude: 3 mV
Lead Channel Setting Pacing Amplitude: 1.5 V
Lead Channel Setting Pacing Amplitude: 2 V
Lead Channel Setting Pacing Pulse Width: 0.4 ms
Lead Channel Setting Sensing Sensitivity: 0.3 mV

## 2020-03-02 ENCOUNTER — Other Ambulatory Visit: Payer: Self-pay

## 2020-03-03 ENCOUNTER — Other Ambulatory Visit: Payer: Self-pay

## 2020-03-03 ENCOUNTER — Ambulatory Visit (INDEPENDENT_AMBULATORY_CARE_PROVIDER_SITE_OTHER): Payer: Medicare Other | Admitting: Cardiology

## 2020-03-03 ENCOUNTER — Encounter: Payer: Self-pay | Admitting: Cardiology

## 2020-03-03 VITALS — BP 152/90 | HR 75 | Temp 97.5°F | Ht 72.0 in | Wt 346.0 lb

## 2020-03-03 DIAGNOSIS — E1151 Type 2 diabetes mellitus with diabetic peripheral angiopathy without gangrene: Secondary | ICD-10-CM | POA: Diagnosis not present

## 2020-03-03 DIAGNOSIS — I10 Essential (primary) hypertension: Secondary | ICD-10-CM | POA: Diagnosis not present

## 2020-03-03 DIAGNOSIS — R0609 Other forms of dyspnea: Secondary | ICD-10-CM

## 2020-03-03 DIAGNOSIS — I5022 Chronic systolic (congestive) heart failure: Secondary | ICD-10-CM | POA: Diagnosis not present

## 2020-03-03 DIAGNOSIS — R0600 Dyspnea, unspecified: Secondary | ICD-10-CM | POA: Diagnosis not present

## 2020-03-03 DIAGNOSIS — Z952 Presence of prosthetic heart valve: Secondary | ICD-10-CM

## 2020-03-03 DIAGNOSIS — I480 Paroxysmal atrial fibrillation: Secondary | ICD-10-CM | POA: Diagnosis not present

## 2020-03-03 NOTE — Progress Notes (Signed)
Remote ICD transmission.   

## 2020-03-03 NOTE — Progress Notes (Signed)
Cardiology Office Note:    Date:  03/03/2020   ID:  Shannon Baxter, DOB Apr 13, 1961, MRN [id number]  PCP:  Nicoletta Dress, MD  Cardiologist:  Jenne Campus, MD    Referring MD: Nicoletta Dress, MD   Chief Complaint  Patient presents with  . Follow-up    3 MO FU   I am doing fine  History of Present Illness:    Shannon Baxter is a 59 y.o. female with past medical history significant for aortic stenosis that required aortic valve replacement with mechanical prosthesis, essential hypertension, cardiomyopathy with ejection fraction 30 to 35%.  She comes today to my office for follow-up of all seems to be doing well.  Denies have any chest pain tightness squeezing pressure burning chest.  She is upset about the cold weather she likes to go to [facility] and swim.  Denies have any palpitations.  Past Medical History:  Diagnosis Date  . Allergy   . Aortic insufficiency   . Aortic stenosis   . Arthritis   . Asthma   . CHF (congestive heart failure) ([place])   . Claudication ([place])   . Coronary artery disease   . Diabetes mellitus without complication ([place])   . GERD (gastroesophageal reflux disease)   . Heart murmur   . Hyperlipidemia   . Hypertension   . Nutritional and metabolic cardiomyopathy ([place])   . PVD (peripheral vascular disease) ([place])   . Sleep apnea   . Stroke [facility])     Past Surgical History:  Procedure Laterality Date  . A-FLUTTER ABLATION N/A 11/05/2018   Procedure: A-FLUTTER ABLATION;  Surgeon: Constance Haw, MD;  Location: [place] [facility];  Service: Cardiovascular;  Laterality: N/A;  . CARDIAC CATHETERIZATION    . INSERT / REPLACE / [place] ICD  . MECHANICAL AORTIC VALVE REPLACEMENT    . PERIPHERAL ARTERIAL STENT GRAFT    . TUBAL LIGATION      Current Medications: Current Meds  Medication Sig  . albuterol (PROVENTIL HFA;VENTOLIN HFA) 108 (90 Base) MCG/ACT inhaler Inhale 1-2 puffs into the lungs every 6 (six) hours as needed for  wheezing or shortness of breath.  . ALPRAZolam (XANAX) 0.5 MG tablet Take 0.5 mg by mouth 3 (three) times daily as needed for anxiety.   Marland Kitchen amiodarone (PACERONE) 200 MG tablet Take 200 mg by mouth daily after breakfast.   . atorvastatin (LIPITOR) 20 MG tablet Take 20 mg by mouth daily.  . carvedilol (COREG) 25 MG tablet TAKE 1 TABLET BY MOUTH TWICE A DAY  . cefdinir (OMNICEF) 300 MG capsule Take 300 mg by mouth 2 (two) times daily.  . clopidogrel (PLAVIX) 75 MG tablet Take 75 mg by mouth daily after breakfast.   . COMBIVENT RESPIMAT 20-100 MCG/ACT AERS respimat SMARTSIG:1 Puff(s) Via Inhaler 4 Times Daily PRN  . Cyanocobalamin (B-12) 500 MCG TABS Take 3 tablets by mouth daily.  Marland Kitchen ENTRESTO 49-51 MG TAKE 1 TABLET BY MOUTH TWICE A DAY  . EPIPEN 2-PAK 0.3 MG/0.3ML SOAJ injection Inject 0.3 mg into the muscle once.   . ezetimibe (ZETIA) 10 MG tablet Take 10 mg by mouth daily.  . fluticasone (FLONASE) 50 MCG/ACT nasal spray Place 2 sprays into both nostrils daily as needed for allergies.   . fluticasone furoate-vilanterol (BREO ELLIPTA) 200-25 MCG/INH AEPB Inhale 1 puff into the lungs daily.  . furosemide (LASIX) 20 MG tablet TAKE 3 TABLETS (60 MG TOTAL) BY MOUTH 2 (TWO) TIMES DAILY.  Marland Kitchen  gabapentin (NEURONTIN) 300 MG capsule Take 300 mg by mouth 3 (three) times daily.   Marland Kitchen glimepiride (AMARYL) 4 MG tablet Take 4 mg by mouth daily with breakfast.   . ipratropium-albuterol (DUONEB) 0.5-2.5 (3) MG/3ML SOLN Take 3 mLs by nebulization 2 (two) times daily.   Marland Kitchen levothyroxine (SYNTHROID) 75 MCG tablet Take 75 mcg by mouth daily.  Marland Kitchen levothyroxine (SYNTHROID) 88 MCG tablet Take 88 mcg by mouth daily.  Marland Kitchen levothyroxine (SYNTHROID, LEVOTHROID) 50 MCG tablet Take 50 mcg by mouth daily before breakfast.  . linaclotide (LINZESS) 72 MCG capsule Take 72 mcg by mouth daily as needed (constipation).  Marland Kitchen loratadine (CLARITIN) 10 MG tablet Take 10 mg by mouth at bedtime.   . metFORMIN (GLUCOPHAGE) 500 MG tablet Take 500 mg  by mouth 2 (two) times daily with a meal.   . montelukast (SINGULAIR) 10 MG tablet Take 10 mg by mouth at bedtime.   . Multiple Vitamins-Minerals (CENTRUM SILVER ULTRA WOMENS) TABS Take 1 tablet by mouth daily.  Marland Kitchen neomycin-polymyxin-hydrocortisone (CORTISPORIN) OTIC solution Apply twice daily to left 1st toenail  . omeprazole (PRILOSEC) 20 MG capsule Take 20 mg by mouth at bedtime.   . Potassium Chloride ER 20 MEQ TBCR Take 1 tablet by mouth daily.  . Promethazine-Codeine 6.25-10 MG/5ML SOLN Take 5 mLs by mouth every 4 (four) hours as needed.  . Red Yeast Rice 600 MG TABS Take 2 tablets by mouth daily.  . rosuvastatin (CRESTOR) 10 MG tablet TAKE UP TO 1 TABLET DAILY AS TOLERATED  . warfarin (COUMADIN) 1 MG tablet Take 1 mg by mouth See admin instructions. Take 6 mg on Sunday, Tuesday, and  Thursday . Take 5 mg on all other days  . warfarin (COUMADIN) 4 MG tablet Take 4 mg by mouth. Take 1 tablet daily except on WED and SUN when you take 5 mgs  . warfarin (COUMADIN) 5 MG tablet Take 5 mg by mouth See admin instructions. Take 6 mg on Sunday, Tuesday, and  Thursday . Take 5 mg on all other days     Allergies:   Iodinated diagnostic agents and Ioxaglate   Social History   Socioeconomic History  . Marital status: Single    Spouse name: Not on file  . Number of children: Not on file  . Years of education: Not on file  . Highest education level: Not on file  Occupational History  . Not on file  Tobacco Use  . Smoking status: Former Games developer  . Smokeless tobacco: Never Used  Substance and Sexual Activity  . Alcohol use: No  . Drug use: No  . Sexual activity: Not on file  Other Topics Concern  . Not on file  Social History Narrative  . Not on file   Social Determinants of Health   Financial Resource Strain:   . Difficulty of Paying Living Expenses:   Food Insecurity:   . Worried About Programme researcher, broadcasting/film/video in the Last Year:   . Barista in the Last Year:   Transportation Needs:  No Transportation Needs  . Lack of Transportation (Medical): No  . Lack of Transportation (Non-Medical): No  Physical Activity:   . Days of Exercise per Week:   . Minutes of Exercise per Session:   Stress:   . Feeling of Stress :   Social Connections:   . Frequency of Communication with Friends and Family:   . Frequency of Social Gatherings with Friends and Family:   . Attends Religious Services:   .  Active Member of Clubs or Organizations:   . Attends Banker Meetings:   Marland Kitchen Marital Status:      Family History: The patient's family history includes Heart disease in her brother. ROS:   Please see the history of present illness.    All 14 point review of systems negative except as described per history of present illness  EKGs/Labs/Other Studies Reviewed:      Recent Labs: 12/01/2019: BUN 12; Creatinine, Ser 1.01; NT-Pro BNP 209; Potassium 4.2; Sodium 145  Recent Lipid Panel    Component Value Date/Time   CHOL  07/04/2007 0815    200        ATP III CLASSIFICATION:  <200     mg/dL   Desirable  785-885  mg/dL   Borderline High  >=027    mg/dL   High   TRIG 741 28/78/6767 0815   HDL 47 07/04/2007 0815   CHOLHDL 4.3 07/04/2007 0815   VLDL 28 07/04/2007 0815   LDLCALC (H) 07/04/2007 0815    125        Total Cholesterol/HDL:CHD Risk Coronary Heart Disease Risk Table                     Men   Women  1/2 Average Risk   3.4   3.3    Physical Exam:    VS:  BP (!) 152/90   Pulse 75   Temp (!) 97.5 F (36.4 C)   Ht 6' (1.829 m)   Wt (!) 346 lb (156.9 kg)   SpO2 99%   BMI 46.93 kg/m     Wt Readings from Last 3 Encounters:  03/03/20 (!) 346 lb (156.9 kg)  12/01/19 (!) 352 lb (159.7 kg)  08/31/19 (!) 345 lb (156.5 kg)     GEN:  Well nourished, well developed in no acute distress HEENT: Normal NECK: No JVD; No carotid bruits LYMPHATICS: No lymphadenopathy CARDIAC: RRR, mechanical valve sounds are crisp, soft systolic murmur grade 1/6 right upper portion  of the sternum, no rubs, no gallops RESPIRATORY:  Clear to auscultation without rales, wheezing or rhonchi  ABDOMEN: Soft, non-tender, non-distended MUSCULOSKELETAL:  No edema; No deformity  SKIN: Warm and dry LOWER EXTREMITIES: no swelling NEUROLOGIC:  Alert and oriented x 3 PSYCHIATRIC:  Normal affect   ASSESSMENT:    1. Chronic systolic congestive heart failure (HCC)   2. Essential hypertension   3. Paroxysmal atrial fibrillation (HCC)   4. Type 2 diabetes mellitus with diabetic peripheral angiopathy without gangrene, without long-term current use of insulin (HCC)   5. History of mechanical aortic valve replacement   6. Dyspnea on exertion    PLAN:    In order of problems listed above:  1. Chronic systolic congestive heart failure appears to be compensated.  She is on Coreg as well as Entresto which I will continue.  I will check her Chem-7.  I did look on interrogation of her device which show normal OptiVol. 2. Essential hypertension blood pressure slightly elevated today but always when she come to our office that is the case at home when she check her blood pressure usually normal. 3. Type 2 diabetes followed by internal medicine team. 4. History aortic valve replacement with slightly increased creatinine but no new symptoms.  We will continue present management. 5. I see her we will do echocardiogram. 6. Dyslipidemia: We will check her fasting lipid profile today.   Medication Adjustments/Labs and Tests Ordered: Current medicines are reviewed at  length with the patient today.  Concerns regarding medicines are outlined above.  No orders of the defined types were placed in this encounter.  Medication changes: No orders of the defined types were placed in this encounter.   Signed, Georgeanna Lea, MD, [facility] 03/03/2020 2:10 PM    [facility]

## 2020-03-03 NOTE — Patient Instructions (Signed)
Medication Instructions:  Your physician recommends that you continue on your current medications as directed. Please refer to the Current Medication list given to you today.  *If you need a refill on your cardiac medications before your next appointment, please call your pharmacy*   Lab Work: Your physician recommends that you return for lab work today: lipid, bmp   If you have labs (blood work) drawn today and your tests are completely normal, you will receive your results only by: . MyChart Message (if you have MyChart) OR . A paper copy in the mail If you have any lab test that is abnormal or we need to change your treatment, we will call you to review the results.   Testing/Procedures: None.   Follow-Up: At [facility], you and your health needs are our priority.  As part of our continuing mission to provide you with exceptional heart care, we have created designated Provider Care Teams.  These Care Teams include your primary Cardiologist (physician) and Advanced Practice Providers (APPs -  Physician Assistants and Nurse Practitioners) who all work together to provide you with the care you need, when you need it.  We recommend signing up for the patient portal called "MyChart".  Sign up information is provided on this After Visit Summary.  MyChart is used to connect with patients for Virtual Visits (Telemedicine).  Patients are able to view lab/test results, encounter notes, upcoming appointments, etc.  Non-urgent messages can be sent to your provider as well.   To learn more about what you can do with MyChart, go to [email or web address].    Your next appointment:   3 month(s)  The format for your next appointment:   In Person  Provider:   Robert Krasowski, MD   Other Instructions    

## 2020-03-04 LAB — BASIC METABOLIC PANEL
BUN/Creatinine Ratio: 12 (ref 9–23)
BUN: 13 mg/dL (ref 6–24)
CO2: 25 mmol/L (ref 20–29)
Calcium: 9 mg/dL (ref 8.7–10.2)
Chloride: 105 mmol/L (ref 96–106)
Creatinine, Ser: 1.12 mg/dL — ABNORMAL HIGH (ref 0.57–1.00)
GFR calc Af Amer: 63 mL/min/{1.73_m2} (ref >59)
GFR calc non Af Amer: 55 mL/min/{1.73_m2} — ABNORMAL LOW (ref >59)
Glucose: 71 mg/dL (ref 65–99)
Potassium: 4.3 mmol/L (ref 3.5–5.2)
Sodium: 144 mmol/L (ref 134–144)

## 2020-03-04 LAB — LIPID PANEL
Chol/HDL Ratio: 3.8 ratio (ref 0.0–4.4)
Cholesterol, Total: 177 mg/dL (ref 100–199)
HDL: 46 mg/dL (ref >39)
LDL Chol Calc (NIH): 105 mg/dL — ABNORMAL HIGH (ref 0–99)
Triglycerides: 149 mg/dL (ref 0–149)
VLDL Cholesterol Cal: 26 mg/dL (ref 5–40)

## 2020-03-15 ENCOUNTER — Telehealth: Payer: Self-pay

## 2020-03-15 DIAGNOSIS — R791 Abnormal coagulation profile: Secondary | ICD-10-CM | POA: Diagnosis not present

## 2020-03-15 NOTE — Telephone Encounter (Signed)
Dr. Kirtland Bouchard, Please see message below. Any recommendations?   Called patient to inform of results per Dr. Bing Matter. She said that she is not taking Lipitor anymore that she is taking Crestor 10 MG daily that was rx'd by her PCP because the other medication was making her stomach hurt. I told her I would let her Dr. Bing Matter know of this change and call her back if he had any recommendations. She thanked me. She had no further questions.

## 2020-03-18 ENCOUNTER — Other Ambulatory Visit: Payer: Self-pay | Admitting: Cardiology

## 2020-03-22 DIAGNOSIS — R791 Abnormal coagulation profile: Secondary | ICD-10-CM | POA: Diagnosis not present

## 2020-03-23 DIAGNOSIS — T466X5A Adverse effect of antihyperlipidemic and antiarteriosclerotic drugs, initial encounter: Secondary | ICD-10-CM | POA: Diagnosis not present

## 2020-03-23 DIAGNOSIS — M791 Myalgia, unspecified site: Secondary | ICD-10-CM | POA: Diagnosis not present

## 2020-03-28 ENCOUNTER — Other Ambulatory Visit: Payer: Self-pay | Admitting: Cardiology

## 2020-03-30 DIAGNOSIS — R791 Abnormal coagulation profile: Secondary | ICD-10-CM | POA: Diagnosis not present

## 2020-04-01 ENCOUNTER — Other Ambulatory Visit: Payer: Self-pay

## 2020-04-01 ENCOUNTER — Ambulatory Visit (INDEPENDENT_AMBULATORY_CARE_PROVIDER_SITE_OTHER): Payer: Medicare Other | Admitting: Sports Medicine

## 2020-04-01 ENCOUNTER — Encounter: Payer: Self-pay | Admitting: Sports Medicine

## 2020-04-01 DIAGNOSIS — M79674 Pain in right toe(s): Secondary | ICD-10-CM | POA: Diagnosis not present

## 2020-04-01 DIAGNOSIS — M79675 Pain in left toe(s): Secondary | ICD-10-CM

## 2020-04-01 DIAGNOSIS — E1165 Type 2 diabetes mellitus with hyperglycemia: Secondary | ICD-10-CM

## 2020-04-01 DIAGNOSIS — B351 Tinea unguium: Secondary | ICD-10-CM

## 2020-04-01 DIAGNOSIS — IMO0002 Reserved for concepts with insufficient information to code with codable children: Secondary | ICD-10-CM

## 2020-04-01 DIAGNOSIS — E1140 Type 2 diabetes mellitus with diabetic neuropathy, unspecified: Secondary | ICD-10-CM | POA: Diagnosis not present

## 2020-04-01 NOTE — Progress Notes (Signed)
Subjective: Shannon Baxter is a 59 y.o. female patient with history of diabetes who presents to office today complaining of long, painful nails while ambulating in shoes; unable to trim. Patient states that the glucose reading this morning was120 last A1c 6.1 and saw PCP Dr. Delena Bali 1 week ago. Patient denies any new changes in medication or new problems.   Patient Active Problem List   Diagnosis Date Noted  . Paroxysmal atrial flutter ([place]) 09/01/2018  . Chronic systolic congestive heart failure ([place]) 12/06/2016  . Morbid obesity ([place]) 12/06/2016  . Presence of automatic implantable cardioverter-defibrillator 10/23/2016  . Dilated cardiomyopathy ([place]) 09/03/2016  . History of mechanical aortic valve replacement 03/29/2016  . Paroxysmal atrial fibrillation ([place]) 03/06/2016  . Rheumatic aortic valve insufficiency 03/05/2016  . Acute on chronic systolic heart failure ([place]) 02/21/2016  . Hypothyroidism 01/23/2016  . Aortic valve stenosis, rheumatic 11/22/2015  . Type 2 diabetes mellitus with diabetic peripheral angiopathy without gangrene, without long-term current use of insulin ([place]) 11/22/2015  . Dyspnea on exertion 10/26/2015  . Atherosclerosis of native arteries of extremities with intermittent claudication, bilateral legs ([place]) 08/04/2015  . Carotid atherosclerosis 07/26/2015  . Coronary artery disease involving native coronary artery of native heart without angina pectoris 07/26/2015  . Dyslipidemia 07/26/2015  . Essential hypertension 07/26/2015  . Intermittent claudication ([place]) 07/26/2015  . Peripheral vascular disease ([place]) 07/26/2015  . History of tobacco abuse 07/26/2015   Current Outpatient Medications on File Prior to Visit  Medication Sig Dispense Refill  . carvedilol (COREG) 25 MG tablet TAKE 1 TABLET BY MOUTH TWICE A DAY 180 tablet 3  . albuterol (PROVENTIL HFA;VENTOLIN HFA) 108 (90 Base) MCG/ACT inhaler Inhale 1-2 puffs into the lungs every 6 (six) hours as needed for  wheezing or shortness of breath.    . ALPRAZolam (XANAX) 0.5 MG tablet Take 0.5 mg by mouth 3 (three) times daily as needed for anxiety.   0  . amiodarone (PACERONE) 200 MG tablet Take 200 mg by mouth daily after breakfast.   0  . atorvastatin (LIPITOR) 20 MG tablet Take 20 mg by mouth daily.    . cefdinir (OMNICEF) 300 MG capsule Take 300 mg by mouth 2 (two) times daily.    . clopidogrel (PLAVIX) 75 MG tablet Take 75 mg by mouth daily after breakfast.   6  . COMBIVENT RESPIMAT 20-100 MCG/ACT AERS respimat SMARTSIG:1 Puff(s) Via Inhaler 4 Times Daily PRN    . Cyanocobalamin (B-12) 500 MCG TABS Take 3 tablets by mouth daily.    Marland Kitchen EPIPEN 2-PAK 0.3 MG/0.3ML SOAJ injection Inject 0.3 mg into the muscle once.   0  . ezetimibe (ZETIA) 10 MG tablet Take 10 mg by mouth daily.    . fluticasone (FLONASE) 50 MCG/ACT nasal spray Place 2 sprays into both nostrils daily as needed for allergies.     . fluticasone furoate-vilanterol (BREO ELLIPTA) 200-25 MCG/INH AEPB Inhale 1 puff into the lungs daily.    . furosemide (LASIX) 20 MG tablet TAKE 3 TABLETS (60 MG TOTAL) BY MOUTH 2 (TWO) TIMES DAILY. 180 tablet 5  . gabapentin (NEURONTIN) 300 MG capsule Take 300 mg by mouth 3 (three) times daily.   5  . glimepiride (AMARYL) 4 MG tablet Take 4 mg by mouth daily with breakfast.     . ipratropium-albuterol (DUONEB) 0.5-2.5 (3) MG/3ML SOLN Take 3 mLs by nebulization 2 (two) times daily.     Marland Kitchen levothyroxine (SYNTHROID) 75 MCG tablet Take 75 mcg by mouth daily.    Marland Kitchen  levothyroxine (SYNTHROID) 88 MCG tablet Take 88 mcg by mouth daily.    Marland Kitchen levothyroxine (SYNTHROID, LEVOTHROID) 50 MCG tablet Take 50 mcg by mouth daily before breakfast.    . linaclotide (LINZESS) 72 MCG capsule Take 72 mcg by mouth daily as needed (constipation).    Marland Kitchen loratadine (CLARITIN) 10 MG tablet Take 10 mg by mouth at bedtime.     . metFORMIN (GLUCOPHAGE) 500 MG tablet Take 500 mg by mouth 2 (two) times daily with a meal.     . montelukast  (SINGULAIR) 10 MG tablet Take 10 mg by mouth at bedtime.     . Multiple Vitamins-Minerals (CENTRUM SILVER ULTRA WOMENS) TABS Take 1 tablet by mouth daily.    Marland Kitchen neomycin-polymyxin-hydrocortisone (CORTISPORIN) OTIC solution Apply twice daily to left 1st toenail 10 mL 0  . omeprazole (PRILOSEC) 20 MG capsule Take 20 mg by mouth at bedtime.     . Potassium Chloride ER 20 MEQ TBCR Take 1 tablet by mouth daily. 90 tablet 1  . Promethazine-Codeine 6.25-10 MG/5ML SOLN Take 5 mLs by mouth every 4 (four) hours as needed.    . Red Yeast Rice 600 MG TABS Take 2 tablets by mouth daily.    . rosuvastatin (CRESTOR) 10 MG tablet TAKE UP TO 1 TABLET DAILY AS TOLERATED 90 tablet 1  . sacubitril-valsartan (ENTRESTO) 49-51 MG Take 1 tablet by mouth 2 (two) times daily. Please make overdue appt with Dr. Curt Bears before anymore refills. 1st attempt 60 tablet 0  . warfarin (COUMADIN) 1 MG tablet Take 1 mg by mouth See admin instructions. Take 6 mg on Sunday, Tuesday, and  Thursday . Take 5 mg on all other days    . warfarin (COUMADIN) 4 MG tablet Take 4 mg by mouth. Take 1 tablet daily except on WED and SUN when you take 5 mgs    . warfarin (COUMADIN) 5 MG tablet Take 5 mg by mouth See admin instructions. Take 6 mg on Sunday, Tuesday, and  Thursday . Take 5 mg on all other days     No current facility-administered medications on file prior to visit.   Allergies  Allergen Reactions  . Iodinated Diagnostic Agents Hives and Rash  . Ioxaglate Hives    Recent Results (from the past 2160 hour(s))  CUP PACEART REMOTE DEVICE CHECK     Status: None   Collection Time: 03/01/20  4:38 AM  Result Value Ref Range   Date Time Interrogation Session [id number]    Pulse Generator Manufacturer MERM    Pulse Gen Model [id number] Evera MRI XT DR    Pulse Gen Serial Number [id number]    Clinic Name [facility]    Implantable Pulse Generator Type Implantable Cardiac Defibulator    Implantable Pulse Generator Implant Date  [id number]    Implantable Lead Manufacturer [facility]    Implantable Lead Model 5076 CapSureFix Novus MRI SureScan    Implantable Lead Serial Number [id number]    Implantable Lead Implant Date 70623762    Implantable Lead Location Detail 1 UNKNOWN    Implantable Lead Location [id number]    Implantable Lead Manufacturer [facility]    Implantable Lead Model [phone] Sprint Quattro Secure S MRI SureScan    Implantable Lead Serial Number [id number]    Implantable Lead Implant Date [id number]    Implantable Lead Location Detail 1 UNKNOWN    Implantable Lead Location [id number]    Lead Channel Setting Sensing Sensitivity 0.3 mV   Lead Channel Setting Pacing Amplitude 1.5 V   Lead  Channel Setting Pacing Pulse Width 0.4 ms   Lead Channel Setting Pacing Amplitude 2 V   Lead Channel Impedance Value 494 ohm   Lead Channel Sensing Intrinsic Amplitude 3 mV   Lead Channel Sensing Intrinsic Amplitude 3 mV   Lead Channel Pacing Threshold Amplitude 0.375 V   Lead Channel Pacing Threshold Pulse Width 0.4 ms   Lead Channel Impedance Value 437 ohm   Lead Channel Impedance Value 380 ohm   Lead Channel Sensing Intrinsic Amplitude 17.75 mV   Lead Channel Sensing Intrinsic Amplitude 17.75 mV   Lead Channel Pacing Threshold Amplitude 1 V   Lead Channel Pacing Threshold Pulse Width 0.4 ms   HighPow Impedance 78 ohm   Battery Status OK    Battery Remaining Longevity 96 mo   Battery Voltage 3.00 V   Brady Statistic RA Percent Paced 0.71 %   Brady Statistic RV Percent Paced 0.04 %   Brady Statistic AP VP Percent 0 %   Brady Statistic AS VP Percent 0.04 %   Brady Statistic AP VS Percent 0.7 %   Brady Statistic AS VS Percent 99.26 %  Lipid panel     Status: Abnormal   Collection Time: 03/03/20  2:17 PM  Result Value Ref Range   Cholesterol, Total 177 100 - 199 mg/dL   Triglycerides 149 0 - 149 mg/dL   HDL 46 >39 mg/dL   VLDL Cholesterol Cal 26 5 - 40 mg/dL   LDL Chol Calc (NIH) 105 (H) 0 - 99 mg/dL   Chol/HDL Ratio 3.8 0.0 -  4.4 ratio    Comment:                                   T. Chol/HDL Ratio                                             Men  Women                               1/2 Avg.Risk  3.4    3.3                                   Avg.Risk  5.0    4.4                                2X Avg.Risk  9.6    7.1                                3X Avg.Risk 23.4   85.2   Basic metabolic panel     Status: Abnormal   Collection Time: 03/03/20  2:17 PM  Result Value Ref Range   Glucose 71 65 - 99 mg/dL   BUN 13 6 - 24 mg/dL   Creatinine, Ser 1.12 (H) 0.57 - 1.00 mg/dL   GFR calc non Af Amer 55 (L) >59 mL/min/1.73   GFR calc Af Amer 63 >59 mL/min/1.73    Comment: **[facility] currently reports eGFR in compliance with the current**   recommendations of  the [employer]. [facility] will   update reporting as new guidelines are published from the NKF-ASN   Task force.    BUN/Creatinine Ratio 12 9 - 23   Sodium 144 134 - 144 mmol/L   Potassium 4.3 3.5 - 5.2 mmol/L   Chloride 105 96 - 106 mmol/L   CO2 25 20 - 29 mmol/L   Calcium 9.0 8.7 - 10.2 mg/dL    Objective: General: Patient is awake, alert, and oriented x 3 and in no acute distress.  Integument: Skin is warm, dry and supple bilateral. Nails are tender, long, thickened and dystrophic with subungual debris, consistent with onychomycosis, 1-5 bilateral.  No signs of infection. No open lesions or preulcerative lesions present bilateral. Remaining integument unremarkable.  Vasculature:  Dorsalis Pedis pulse 1/4 bilateral. Posterior Tibial pulse  0/4 bilateral. Capillary fill time <3 sec 1-5 bilateral. Positive hair growth to the level of the digits.Temperature gradient within normal limits. No varicosities present bilateral. No edema present bilateral.   Neurology: The patient has intact sensation measured with a 5.07/10g Semmes Weinstein Monofilament at all pedal sites bilateral. Vibratory sensation diminished bilateral with tuning fork. No Babinski  sign present bilateral.   Musculoskeletal:Asymptomatic pes planus pedal deformities noted bilateral. Muscular strength 5/5 in all lower extremity muscular groups bilateral. No tenderness with calf compression bilateral.  Assessment and Plan: Problem List Items Addressed This Visit    None    Visit Diagnoses    Pain due to onychomycosis of toenails of both feet    -  Primary   Type 2 diabetes, uncontrolled, with neuropathy ([place])         -Examined patient. -Re-Discussed and educated patient on diabetic foot care -Mechanically debrided all nails 1-5 bilateral using sterile nail nipper and filed with dremel without incident  -Patient to return  in 3 months for at risk foot care -Patient advised to call the office if any problems or questions arise in the meantime.  Landis Martins, DPM

## 2020-04-04 ENCOUNTER — Other Ambulatory Visit: Payer: Self-pay | Admitting: Cardiology

## 2020-04-07 DIAGNOSIS — G4733 Obstructive sleep apnea (adult) (pediatric): Secondary | ICD-10-CM | POA: Diagnosis not present

## 2020-04-07 DIAGNOSIS — J301 Allergic rhinitis due to pollen: Secondary | ICD-10-CM | POA: Diagnosis not present

## 2020-04-07 DIAGNOSIS — J4530 Mild persistent asthma, uncomplicated: Secondary | ICD-10-CM | POA: Diagnosis not present

## 2020-04-11 DIAGNOSIS — R791 Abnormal coagulation profile: Secondary | ICD-10-CM | POA: Diagnosis not present

## 2020-04-13 ENCOUNTER — Other Ambulatory Visit: Payer: Self-pay | Admitting: Cardiology

## 2020-04-16 DIAGNOSIS — G4733 Obstructive sleep apnea (adult) (pediatric): Secondary | ICD-10-CM | POA: Diagnosis not present

## 2020-04-17 ENCOUNTER — Other Ambulatory Visit: Payer: Self-pay | Admitting: Cardiology

## 2020-04-18 DIAGNOSIS — R791 Abnormal coagulation profile: Secondary | ICD-10-CM | POA: Diagnosis not present

## 2020-04-19 MED ORDER — ENTRESTO 49-51 MG PO TABS: 1.0000 | ORAL_TABLET | Freq: Two times a day (BID) | ORAL | 0 refills | Status: DC

## 2020-04-22 DIAGNOSIS — G4733 Obstructive sleep apnea (adult) (pediatric): Secondary | ICD-10-CM | POA: Diagnosis not present

## 2020-04-22 DIAGNOSIS — R791 Abnormal coagulation profile: Secondary | ICD-10-CM | POA: Diagnosis not present

## 2020-04-29 DIAGNOSIS — R791 Abnormal coagulation profile: Secondary | ICD-10-CM | POA: Diagnosis not present

## 2020-04-29 DIAGNOSIS — I4892 Unspecified atrial flutter: Secondary | ICD-10-CM | POA: Diagnosis not present

## 2020-04-29 DIAGNOSIS — M79605 Pain in left leg: Secondary | ICD-10-CM | POA: Diagnosis not present

## 2020-04-29 DIAGNOSIS — I350 Nonrheumatic aortic (valve) stenosis: Secondary | ICD-10-CM | POA: Diagnosis not present

## 2020-04-29 DIAGNOSIS — Z952 Presence of prosthetic heart valve: Secondary | ICD-10-CM | POA: Diagnosis not present

## 2020-04-29 DIAGNOSIS — I110 Hypertensive heart disease with heart failure: Secondary | ICD-10-CM | POA: Diagnosis not present

## 2020-04-29 DIAGNOSIS — M79604 Pain in right leg: Secondary | ICD-10-CM | POA: Diagnosis not present

## 2020-04-29 DIAGNOSIS — I480 Paroxysmal atrial fibrillation: Secondary | ICD-10-CM | POA: Diagnosis not present

## 2020-04-30 ENCOUNTER — Other Ambulatory Visit: Payer: Self-pay | Admitting: Cardiology

## 2020-05-04 DIAGNOSIS — I70213 Atherosclerosis of native arteries of extremities with intermittent claudication, bilateral legs: Secondary | ICD-10-CM | POA: Diagnosis not present

## 2020-05-06 DIAGNOSIS — R791 Abnormal coagulation profile: Secondary | ICD-10-CM | POA: Diagnosis not present

## 2020-05-09 DIAGNOSIS — R791 Abnormal coagulation profile: Secondary | ICD-10-CM | POA: Diagnosis not present

## 2020-05-10 ENCOUNTER — Telehealth: Payer: Self-pay | Admitting: Cardiology

## 2020-05-10 NOTE — Telephone Encounter (Signed)
Shannon Baxter is calling to see if the medical records sent to the office by Dr. Jeralene Huff at [facility] have been received. Please advise.

## 2020-05-11 NOTE — Telephone Encounter (Signed)
Called patient and left voicemail that we do have records from Dr. Tollie Pizza with [facility]. I told her to give our office a call back if she had any further questions.   *Records are in care everywhere.

## 2020-05-13 DIAGNOSIS — R791 Abnormal coagulation profile: Secondary | ICD-10-CM | POA: Diagnosis not present

## 2020-05-16 DIAGNOSIS — R531 Weakness: Secondary | ICD-10-CM | POA: Diagnosis not present

## 2020-05-16 DIAGNOSIS — R112 Nausea with vomiting, unspecified: Secondary | ICD-10-CM | POA: Diagnosis not present

## 2020-05-16 DIAGNOSIS — R791 Abnormal coagulation profile: Secondary | ICD-10-CM | POA: Diagnosis not present

## 2020-05-16 DIAGNOSIS — R197 Diarrhea, unspecified: Secondary | ICD-10-CM | POA: Diagnosis not present

## 2020-05-18 DIAGNOSIS — I110 Hypertensive heart disease with heart failure: Secondary | ICD-10-CM | POA: Diagnosis not present

## 2020-05-18 DIAGNOSIS — E785 Hyperlipidemia, unspecified: Secondary | ICD-10-CM | POA: Diagnosis not present

## 2020-05-18 DIAGNOSIS — Z79899 Other long term (current) drug therapy: Secondary | ICD-10-CM | POA: Diagnosis not present

## 2020-05-18 DIAGNOSIS — E1140 Type 2 diabetes mellitus with diabetic neuropathy, unspecified: Secondary | ICD-10-CM | POA: Diagnosis not present

## 2020-05-18 DIAGNOSIS — E039 Hypothyroidism, unspecified: Secondary | ICD-10-CM | POA: Diagnosis not present

## 2020-05-18 DIAGNOSIS — E1165 Type 2 diabetes mellitus with hyperglycemia: Secondary | ICD-10-CM | POA: Diagnosis not present

## 2020-05-18 DIAGNOSIS — I2510 Atherosclerotic heart disease of native coronary artery without angina pectoris: Secondary | ICD-10-CM | POA: Diagnosis not present

## 2020-05-21 ENCOUNTER — Other Ambulatory Visit: Payer: Self-pay | Admitting: Cardiology

## 2020-05-23 DIAGNOSIS — R791 Abnormal coagulation profile: Secondary | ICD-10-CM | POA: Diagnosis not present

## 2020-05-26 ENCOUNTER — Telehealth: Payer: Self-pay

## 2020-05-26 NOTE — Patient Outreach (Signed)
Called received from Ms. Boening. Attempted to return call but she was not available.  Left a HIPAA compliant voice message requesting a call when she is available.  Will plan to refer to the central Care Management team if she requires care management assistance.   Katha Cabal [facility] Care Management [phone]

## 2020-05-27 ENCOUNTER — Other Ambulatory Visit: Payer: Self-pay | Admitting: Cardiology

## 2020-05-30 ENCOUNTER — Other Ambulatory Visit: Payer: Self-pay

## 2020-05-30 DIAGNOSIS — G4733 Obstructive sleep apnea (adult) (pediatric): Secondary | ICD-10-CM | POA: Insufficient documentation

## 2020-05-30 DIAGNOSIS — G4730 Sleep apnea, unspecified: Secondary | ICD-10-CM | POA: Insufficient documentation

## 2020-05-30 DIAGNOSIS — I10 Essential (primary) hypertension: Secondary | ICD-10-CM | POA: Insufficient documentation

## 2020-05-30 DIAGNOSIS — I351 Nonrheumatic aortic (valve) insufficiency: Secondary | ICD-10-CM | POA: Insufficient documentation

## 2020-05-30 DIAGNOSIS — I2510 Atherosclerotic heart disease of native coronary artery without angina pectoris: Secondary | ICD-10-CM | POA: Insufficient documentation

## 2020-05-30 DIAGNOSIS — I509 Heart failure, unspecified: Secondary | ICD-10-CM | POA: Insufficient documentation

## 2020-05-30 DIAGNOSIS — K219 Gastro-esophageal reflux disease without esophagitis: Secondary | ICD-10-CM | POA: Insufficient documentation

## 2020-05-30 DIAGNOSIS — E639 Nutritional deficiency, unspecified: Secondary | ICD-10-CM | POA: Insufficient documentation

## 2020-05-30 DIAGNOSIS — I739 Peripheral vascular disease, unspecified: Secondary | ICD-10-CM | POA: Insufficient documentation

## 2020-05-30 DIAGNOSIS — I350 Nonrheumatic aortic (valve) stenosis: Secondary | ICD-10-CM | POA: Insufficient documentation

## 2020-05-30 DIAGNOSIS — R791 Abnormal coagulation profile: Secondary | ICD-10-CM | POA: Diagnosis not present

## 2020-05-30 DIAGNOSIS — E119 Type 2 diabetes mellitus without complications: Secondary | ICD-10-CM | POA: Insufficient documentation

## 2020-05-30 DIAGNOSIS — I639 Cerebral infarction, unspecified: Secondary | ICD-10-CM | POA: Insufficient documentation

## 2020-05-30 DIAGNOSIS — J45909 Unspecified asthma, uncomplicated: Secondary | ICD-10-CM | POA: Insufficient documentation

## 2020-05-30 DIAGNOSIS — J208 Acute bronchitis due to other specified organisms: Secondary | ICD-10-CM | POA: Diagnosis not present

## 2020-05-30 DIAGNOSIS — E785 Hyperlipidemia, unspecified: Secondary | ICD-10-CM | POA: Insufficient documentation

## 2020-05-30 DIAGNOSIS — T7840XA Allergy, unspecified, initial encounter: Secondary | ICD-10-CM | POA: Insufficient documentation

## 2020-05-30 DIAGNOSIS — M1990 Unspecified osteoarthritis, unspecified site: Secondary | ICD-10-CM | POA: Insufficient documentation

## 2020-05-30 DIAGNOSIS — R011 Cardiac murmur, unspecified: Secondary | ICD-10-CM | POA: Insufficient documentation

## 2020-05-31 ENCOUNTER — Ambulatory Visit (INDEPENDENT_AMBULATORY_CARE_PROVIDER_SITE_OTHER): Payer: Medicare Other | Admitting: *Deleted

## 2020-05-31 DIAGNOSIS — I4892 Unspecified atrial flutter: Secondary | ICD-10-CM

## 2020-05-31 LAB — CUP PACEART REMOTE DEVICE CHECK
Battery Remaining Longevity: 89 mo
Battery Voltage: 2.99 V
Brady Statistic AP VP Percent: 0 %
Brady Statistic AP VS Percent: 1.16 %
Brady Statistic AS VP Percent: 0.04 %
Brady Statistic AS VS Percent: 98.8 %
Brady Statistic RA Percent Paced: 1.16 %
Brady Statistic RV Percent Paced: 0.04 %
Date Time Interrogation Session: 20210810052725
HighPow Impedance: 66 Ohm
Implantable Lead Implant Date: 20180102
Implantable Lead Implant Date: 20180102
Implantable Lead Location: 753859
Implantable Lead Location: 753860
Implantable Lead Model: 5076
Implantable Pulse Generator Implant Date: 20180102
Lead Channel Impedance Value: 323 Ohm
Lead Channel Impedance Value: 399 Ohm
Lead Channel Impedance Value: 437 Ohm
Lead Channel Pacing Threshold Amplitude: 0.375 V
Lead Channel Pacing Threshold Amplitude: 1 V
Lead Channel Pacing Threshold Pulse Width: 0.4 ms
Lead Channel Pacing Threshold Pulse Width: 0.4 ms
Lead Channel Sensing Intrinsic Amplitude: 2.875 mV
Lead Channel Sensing Intrinsic Amplitude: 2.875 mV
Lead Channel Sensing Intrinsic Amplitude: 23.125 mV
Lead Channel Sensing Intrinsic Amplitude: 23.125 mV
Lead Channel Setting Pacing Amplitude: 1.5 V
Lead Channel Setting Pacing Amplitude: 2 V
Lead Channel Setting Pacing Pulse Width: 0.4 ms
Lead Channel Setting Sensing Sensitivity: 0.3 mV

## 2020-06-01 NOTE — Progress Notes (Signed)
Remote ICD transmission.   

## 2020-06-02 DIAGNOSIS — J4530 Mild persistent asthma, uncomplicated: Secondary | ICD-10-CM | POA: Diagnosis not present

## 2020-06-02 DIAGNOSIS — J301 Allergic rhinitis due to pollen: Secondary | ICD-10-CM | POA: Diagnosis not present

## 2020-06-02 DIAGNOSIS — G4733 Obstructive sleep apnea (adult) (pediatric): Secondary | ICD-10-CM | POA: Diagnosis not present

## 2020-06-03 ENCOUNTER — Encounter: Payer: Self-pay | Admitting: Cardiology

## 2020-06-03 ENCOUNTER — Other Ambulatory Visit: Payer: Self-pay

## 2020-06-03 ENCOUNTER — Ambulatory Visit (INDEPENDENT_AMBULATORY_CARE_PROVIDER_SITE_OTHER): Payer: Medicare Other | Admitting: Cardiology

## 2020-06-03 VITALS — BP 120/68 | HR 68 | Ht 72.0 in | Wt 343.0 lb

## 2020-06-03 DIAGNOSIS — Z95810 Presence of automatic (implantable) cardiac defibrillator: Secondary | ICD-10-CM

## 2020-06-03 DIAGNOSIS — E119 Type 2 diabetes mellitus without complications: Secondary | ICD-10-CM

## 2020-06-03 DIAGNOSIS — I5023 Acute on chronic systolic (congestive) heart failure: Secondary | ICD-10-CM

## 2020-06-03 DIAGNOSIS — I739 Peripheral vascular disease, unspecified: Secondary | ICD-10-CM | POA: Diagnosis not present

## 2020-06-03 DIAGNOSIS — Z952 Presence of prosthetic heart valve: Secondary | ICD-10-CM

## 2020-06-03 DIAGNOSIS — I5022 Chronic systolic (congestive) heart failure: Secondary | ICD-10-CM

## 2020-06-03 NOTE — Patient Instructions (Signed)
Medication Instructions:  Your physician recommends that you continue on your current medications as directed. Please refer to the Current Medication list given to you today.  *If you need a refill on your cardiac medications before your next appointment, please call your pharmacy*   Lab Work: Your physician recommends that you return for lab work today: bmp   If you have labs (blood work) drawn today and your tests are completely normal, you will receive your results only by: . MyChart Message (if you have MyChart) OR . A paper copy in the mail If you have any lab test that is abnormal or we need to change your treatment, we will call you to review the results.   Testing/Procedures: Your physician has requested that you have an echocardiogram. Echocardiography is a painless test that uses sound waves to create images of your heart. It provides your doctor with information about the size and shape of your heart and how well your heart's chambers and valves are working. This procedure takes approximately one hour. There are no restrictions for this procedure.     Follow-Up: At [facility], you and your health needs are our priority.  As part of our continuing mission to provide you with exceptional heart care, we have created designated Provider Care Teams.  These Care Teams include your primary Cardiologist (physician) and Advanced Practice Providers (APPs -  Physician Assistants and Nurse Practitioners) who all work together to provide you with the care you need, when you need it.  We recommend signing up for the patient portal called "MyChart".  Sign up information is provided on this After Visit Summary.  MyChart is used to connect with patients for Virtual Visits (Telemedicine).  Patients are able to view lab/test results, encounter notes, upcoming appointments, etc.  Non-urgent messages can be sent to your provider as well.   To learn more about what you can do with MyChart, go to  [email or web address].    Your next appointment:   5 month(s)  The format for your next appointment:   In Person  Provider:   Robert Krasowski, MD   Other Instructions   Echocardiogram An echocardiogram is a procedure that uses painless sound waves (ultrasound) to produce an image of the heart. Images from an echocardiogram can provide important information about:  Signs of coronary artery disease (CAD).  Aneurysm detection. An aneurysm is a weak or damaged part of an artery wall that bulges out from the normal force of blood pumping through the body.  Heart size and shape. Changes in the size or shape of the heart can be associated with certain conditions, including heart failure, aneurysm, and CAD.  Heart muscle function.  Heart valve function.  Signs of a past heart attack.  Fluid buildup around the heart.  Thickening of the heart muscle.  A tumor or infectious growth around the heart valves. Tell a health care provider about:  Any allergies you have.  All medicines you are taking, including vitamins, herbs, eye drops, creams, and over-the-counter medicines.  Any blood disorders you have.  Any surgeries you have had.  Any medical conditions you have.  Whether you are pregnant or may be pregnant. What are the risks? Generally, this is a safe procedure. However, problems may occur, including:  Allergic reaction to dye (contrast) that may be used during the procedure. What happens before the procedure? No specific preparation is needed. You may eat and drink normally. What happens during the procedure?   An IV tube   may be inserted into one of your veins.  You may receive contrast through this tube. A contrast is an injection that improves the quality of the pictures from your heart.  A gel will be applied to your chest.  A wand-like tool (transducer) will be moved over your chest. The gel will help to transmit the sound waves from the  transducer.  The sound waves will harmlessly bounce off of your heart to allow the heart images to be captured in real-time motion. The images will be recorded on a computer. The procedure may vary among health care providers and hospitals. What happens after the procedure?  You may return to your normal, everyday life, including diet, activities, and medicines, unless your health care provider tells you not to do that. Summary  An echocardiogram is a procedure that uses painless sound waves (ultrasound) to produce an image of the heart.  Images from an echocardiogram can provide important information about the size and shape of your heart, heart muscle function, heart valve function, and fluid buildup around your heart.  You do not need to do anything to prepare before this procedure. You may eat and drink normally.  After the echocardiogram is completed, you may return to your normal, everyday life, unless your health care provider tells you not to do that. This information is not intended to replace advice given to you by your health care provider. Make sure you discuss any questions you have with your health care provider. Document Revised: 01/29/2019 Document Reviewed: 11/10/2016 Elsevier Patient Education  2020 Elsevier Inc.   

## 2020-06-03 NOTE — Progress Notes (Signed)
Cardiology Office Note:    Date:  06/03/2020   ID:  Shannon Baxter, DOB 1961/09/29, MRN [id number]  PCP:  Paulina Fusi, MD  Cardiologist:  Gypsy Balsam, MD    Referring MD: Paulina Fusi, MD   No chief complaint on file. I am doing well  History of Present Illness:    Shannon Baxter is a 59 y.o. female with past medical history significant for arctic valve replacement with mechanical bioprosthesis 23 mm Saint Jude done at [facility], paroxysmal atrial fibrillation, cardiomyopathy with ejection fraction 30 to 35% last assessed in October 2020, dyslipidemia, significant peripheral vascular disease.  She comes today 2 months for follow-up.  Overall she says she is feeling well.  About a month ago she ended going to the emergency room thinking she got Covid likely it was not the case.  She did have some diarrhea.  Recovered nicely from it.  Trying to be active trying to walk some the problem is claudication that she is experiencing.  That being followed by Dr. Jeralene Huff.  Denies having any discharges from her defibrillator.  There is no dizziness no passing out no swelling of lower extremities.  Past Medical History:  Diagnosis Date  . Acute on chronic systolic heart failure (HCC) 02/21/2016  . Allergy   . Aortic insufficiency   . Aortic stenosis   . Aortic valve stenosis, rheumatic 11/22/2015   Overview:  Appears to be severe basal latest echocardiogram peak gradient of 69 mmHg mean of 45 mmHg, calculated aortic valve area of 0.67 cm  . Arthritis   . Asthma   . Atherosclerosis of native arteries of extremities with intermittent claudication, bilateral legs (HCC) 08/04/2015  . Carotid atherosclerosis 07/26/2015  . CHF (congestive heart failure) (HCC)   . Chronic systolic congestive heart failure (HCC) 12/06/2016  . Claudication (HCC)   . Coronary artery disease   . Coronary artery disease involving native coronary artery of native heart without angina pectoris 07/26/2015  . Diabetes  mellitus without complication (HCC)   . Dilated cardiomyopathy (HCC) 09/03/2016   Overview:  Ejection fraction 20-25%  . Dyslipidemia 07/26/2015  . Dyspnea on exertion 10/26/2015  . Essential hypertension 07/26/2015  . GERD (gastroesophageal reflux disease)   . Heart murmur   . History of mechanical aortic valve replacement 03/29/2016   Overview:  May 2017. St Judes 23 mm  Formatting of this note might be different from the original. Overview:  May 2017. St Judes 23 mm Formatting of this note might be different from the original. May 2017. St Judes 23 mm  . History of tobacco abuse 07/26/2015  . Hyperlipidemia   . Hypertension   . Hypothyroidism 01/23/2016  . Intermittent claudication (HCC) 07/26/2015  . Morbid obesity (HCC) 12/06/2016  . Nutritional and metabolic cardiomyopathy (HCC)   . Paroxysmal atrial fibrillation (HCC) 03/06/2016  . Paroxysmal atrial flutter (HCC) 09/01/2018  . Peripheral vascular disease (HCC) 07/26/2015  . Presence of automatic implantable cardioverter-defibrillator 10/23/2016  . PVD (peripheral vascular disease) (HCC)   . Rheumatic aortic valve insufficiency 03/05/2016  . Sleep apnea   . Stroke (HCC)   . Type 2 diabetes mellitus with diabetic peripheral angiopathy without gangrene, without long-term current use of insulin (HCC) 11/22/2015    Past Surgical History:  Procedure Laterality Date  . A-FLUTTER ABLATION N/A 11/05/2018   Procedure: A-FLUTTER ABLATION;  Surgeon: Regan Lemming, MD;  Location: [facility]VASIVE [facility];  Service: Cardiovascular;  Laterality: N/A;  . CARDIAC CATHETERIZATION    .  INSERT / REPLACE / REMOVE PACEMAKER     Medtronic ICD  . MECHANICAL AORTIC VALVE REPLACEMENT    . PERIPHERAL ARTERIAL STENT GRAFT    . TUBAL LIGATION      Current Medications: Current Meds  Medication Sig  . albuterol (PROVENTIL HFA;VENTOLIN HFA) 108 (90 Base) MCG/ACT inhaler Inhale 1-2 puffs into the lungs every 6 (six) hours as needed for wheezing or shortness of  breath.  . ALPRAZolam (XANAX) 0.5 MG tablet Take 0.5 mg by mouth 3 (three) times daily as needed for anxiety.   Marland Kitchen amiodarone (PACERONE) 200 MG tablet Take 200 mg by mouth daily after breakfast.   . atorvastatin (LIPITOR) 20 MG tablet Take 20 mg by mouth daily.  . carvedilol (COREG) 25 MG tablet TAKE 1 TABLET BY MOUTH TWICE A DAY  . cefdinir (OMNICEF) 300 MG capsule Take 300 mg by mouth 2 (two) times daily.  . clopidogrel (PLAVIX) 75 MG tablet Take 75 mg by mouth daily after breakfast.   . COMBIVENT RESPIMAT 20-100 MCG/ACT AERS respimat SMARTSIG:1 Puff(s) Via Inhaler 4 Times Daily PRN  . Cyanocobalamin (B-12) 500 MCG TABS Take 3 tablets by mouth daily.  Marland Kitchen ENTRESTO 49-51 MG TAKE 1 TABLET BY MOUTH 2 (TWO) TIMES DAILY. PLEASE MAKE OVERDUE APPT WITH DR. Elberta Fortis BEFORE ANYMORE REFILLS. 2ND ATTEMPT  . EPIPEN 2-PAK 0.3 MG/0.3ML SOAJ injection Inject 0.3 mg into the muscle once.   . ezetimibe (ZETIA) 10 MG tablet Take 10 mg by mouth daily.  . fluticasone (FLONASE) 50 MCG/ACT nasal spray Place 2 sprays into both nostrils daily as needed for allergies.   . fluticasone furoate-vilanterol (BREO ELLIPTA) 200-25 MCG/INH AEPB Inhale 1 puff into the lungs daily.  . furosemide (LASIX) 20 MG tablet TAKE 3 TABLETS (60 MG TOTAL) BY MOUTH 2 (TWO) TIMES DAILY.  Marland Kitchen gabapentin (NEURONTIN) 300 MG capsule Take 300 mg by mouth 3 (three) times daily.   Marland Kitchen glimepiride (AMARYL) 4 MG tablet Take 4 mg by mouth daily with breakfast.   . HYDROcodone-acetaminophen (NORCO) 10-325 MG tablet Take 1 tablet by mouth 4 (four) times daily as needed.  Marland Kitchen ipratropium-albuterol (DUONEB) 0.5-2.5 (3) MG/3ML SOLN Take 3 mLs by nebulization 2 (two) times daily.   Marland Kitchen levothyroxine (SYNTHROID) 75 MCG tablet Take 75 mcg by mouth daily.  Marland Kitchen levothyroxine (SYNTHROID) 88 MCG tablet Take 88 mcg by mouth daily.  Marland Kitchen levothyroxine (SYNTHROID, LEVOTHROID) 50 MCG tablet Take 50 mcg by mouth daily before breakfast.  . linaclotide (LINZESS) 72 MCG capsule Take 72  mcg by mouth daily as needed (constipation).  Marland Kitchen loratadine (CLARITIN) 10 MG tablet Take 10 mg by mouth at bedtime.   . metFORMIN (GLUCOPHAGE) 500 MG tablet Take 500 mg by mouth 2 (two) times daily with a meal.   . montelukast (SINGULAIR) 10 MG tablet Take 10 mg by mouth at bedtime.   . Multiple Vitamins-Minerals (CENTRUM SILVER ULTRA WOMENS) TABS Take 1 tablet by mouth daily.  Marland Kitchen neomycin-polymyxin-hydrocortisone (CORTISPORIN) OTIC solution Apply twice daily to left 1st toenail  . nystatin (MYCOSTATIN/NYSTOP) powder Apply 1 application topically 3 (three) times daily.  Marland Kitchen omeprazole (PRILOSEC) 20 MG capsule Take 20 mg by mouth at bedtime.   . ondansetron (ZOFRAN-ODT) 4 MG disintegrating tablet Take 4 mg by mouth every 6 (six) hours.  . Potassium Chloride ER 20 MEQ TBCR TAKE 1 TABLET BY MOUTH EVERY DAY  . promethazine (PHENERGAN) 25 MG suppository Place 25 mg rectally every 6 (six) hours as needed.  . Promethazine-Codeine 6.25-10 MG/5ML SOLN Take  5 mLs by mouth every 4 (four) hours as needed.  . Red Yeast Rice 600 MG TABS Take 2 tablets by mouth daily.  . rosuvastatin (CRESTOR) 10 MG tablet TAKE UP TO 1 TABLET DAILY AS TOLERATED  . warfarin (COUMADIN) 1 MG tablet Take 1 mg by mouth See admin instructions. Take 6 mg on Sunday, Tuesday, and  Thursday . Take 5 mg on all other days  . warfarin (COUMADIN) 4 MG tablet Take 4 mg by mouth. Take 1 tablet daily except on WED and SUN when you take 5 mgs  . warfarin (COUMADIN) 5 MG tablet Take 5 mg by mouth See admin instructions. Take 6 mg on Sunday, Tuesday, and  Thursday . Take 5 mg on all other days     Allergies:   Iodinated diagnostic agents and Ioxaglate   Social History   Socioeconomic History  . Marital status: Single    Spouse name: Not on file  . Number of children: Not on file  . Years of education: Not on file  . Highest education level: Not on file  Occupational History  . Not on file  Tobacco Use  . Smoking status: Former Games developer  .  Smokeless tobacco: Never Used  Vaping Use  . Vaping Use: Never used  Substance and Sexual Activity  . Alcohol use: No  . Drug use: No  . Sexual activity: Not on file  Other Topics Concern  . Not on file  Social History Narrative  . Not on file   Social Determinants of Health   Financial Resource Strain:   . Difficulty of Paying Living Expenses:   Food Insecurity:   . Worried About Programme researcher, broadcasting/film/video in the Last Year:   . Barista in the Last Year:   Transportation Needs:   . Freight forwarder (Medical):   Marland Kitchen Lack of Transportation (Non-Medical):   Physical Activity:   . Days of Exercise per Week:   . Minutes of Exercise per Session:   Stress:   . Feeling of Stress :   Social Connections:   . Frequency of Communication with Friends and Family:   . Frequency of Social Gatherings with Friends and Family:   . Attends Religious Services:   . Active Member of Clubs or Organizations:   . Attends Banker Meetings:   Marland Kitchen Marital Status:      Family History: The patient's family history includes Heart disease in her brother. ROS:   Please see the history of present illness.    All 14 point review of systems negative except as described per history of present illness  EKGs/Labs/Other Studies Reviewed:      Recent Labs: 12/01/2019: NT-Pro BNP 209 03/03/2020: BUN 13; Creatinine, Ser 1.12; Potassium 4.3; Sodium 144  Recent Lipid Panel    Component Value Date/Time   CHOL 177 03/03/2020 1417   TRIG 149 03/03/2020 1417   HDL 46 03/03/2020 1417   CHOLHDL 3.8 03/03/2020 1417   CHOLHDL 4.3 07/04/2007 0815   VLDL 28 07/04/2007 0815   LDLCALC 105 (H) 03/03/2020 1417    Physical Exam:    VS:  BP 120/68   Pulse 68   Ht 6' (1.829 m)   Wt (!) 343 lb (155.6 kg)   SpO2 93%   BMI 46.52 kg/m     Wt Readings from Last 3 Encounters:  06/03/20 (!) 343 lb (155.6 kg)  03/03/20 (!) 346 lb (156.9 kg)  12/01/19 (!) 352 lb (159.7 kg)  GEN:  Well nourished,  well developed in no acute distress HEENT: Normal NECK: No JVD; No carotid bruits LYMPHATICS: No lymphadenopathy CARDIAC: RRR, crisp mechanical valve sounds, no murmurs, no rubs, no gallops RESPIRATORY:  Clear to auscultation without rales, wheezing or rhonchi  ABDOMEN: Soft, non-tender, non-distended MUSCULOSKELETAL:  No edema; No deformity  SKIN: Warm and dry LOWER EXTREMITIES: no swelling NEUROLOGIC:  Alert and oriented x 3 PSYCHIATRIC:  Normal affect   ASSESSMENT:    1. Acute on chronic systolic heart failure (HCC)   2. History of mechanical aortic valve replacement   3. Presence of automatic implantable cardioverter-defibrillator   4. Diabetes mellitus without complication (HCC)   5. PVD (peripheral vascular disease) (HCC)   6. Chronic systolic congestive heart failure (HCC)    PLAN:    In order of problems listed above:  1. Chronic congestive heart failure which is systolic in nature.  She is on Coreg 25, Entresto 4951 twice daily, Lasix 60 mg twice daily, I will ask you to have Chem-7 checked today to see if there is in the room to give her Aldactone.  Overall she is compensated.  I did review her interrogation of ICD that she has and luckily it showed normal OptiVol. 2. Aortic stenosis status post aortic valve replacement.  It is a mechanical prosthesis 23 mm Saint Jude functioning normally based on last echocardiogram.  Echocardiogram will be repeated in October. 3. Paroxysmal atrial fibrillation/flutter, doing well from that point review continue with amiodarone.  Continue anticoagulation. 4. Diabetes mellitus: I did review her K PN her hemoglobin A1c was 6.2 in the 05/18/2020 is a good number.  I congratulated her and encouraged her to stay on top of her diabetes. 5. Peripheral vascular disease she still symptomatic I advised her to continue exercising on the regular basis hopefully that will improve her symptomatology.  She does have normal ABI on both sides.  She is being  followed by Dr. Jeralene Huff.   Medication Adjustments/Labs and Tests Ordered: Current medicines are reviewed at length with the patient today.  Concerns regarding medicines are outlined above.  Orders Placed This Encounter  Procedures  . Basic metabolic panel  . ECHOCARDIOGRAM COMPLETE   Medication changes: No orders of the defined types were placed in this encounter.   Signed, Georgeanna Lea, MD, [facility] 06/03/2020 1:53 PM    [facility]

## 2020-06-04 LAB — BASIC METABOLIC PANEL
BUN/Creatinine Ratio: 18 (ref 9–23)
BUN: 17 mg/dL (ref 6–24)
CO2: 23 mmol/L (ref 20–29)
Calcium: 8.9 mg/dL (ref 8.7–10.2)
Chloride: 102 mmol/L (ref 96–106)
Creatinine, Ser: 0.92 mg/dL (ref 0.57–1.00)
GFR calc Af Amer: 79 mL/min/{1.73_m2} (ref >59)
GFR calc non Af Amer: 69 mL/min/{1.73_m2} (ref >59)
Glucose: 151 mg/dL — ABNORMAL HIGH (ref 65–99)
Potassium: 4.1 mmol/L (ref 3.5–5.2)
Sodium: 139 mmol/L (ref 134–144)

## 2020-06-06 DIAGNOSIS — R791 Abnormal coagulation profile: Secondary | ICD-10-CM | POA: Diagnosis not present

## 2020-06-07 ENCOUNTER — Telehealth: Payer: Self-pay

## 2020-06-07 NOTE — Telephone Encounter (Signed)
Spoke with patient regarding results and recommendation.  Patient verbalizes understanding and is agreeable to plan of care. Advised patient to call back with any issues or concerns.  

## 2020-06-07 NOTE — Telephone Encounter (Signed)
-----   Message from Georgeanna Lea, MD sent at 06/07/2020  9:04 AM EDT ----- Labs are looking good, continue present management

## 2020-06-10 DIAGNOSIS — Z1231 Encounter for screening mammogram for malignant neoplasm of breast: Secondary | ICD-10-CM | POA: Diagnosis not present

## 2020-06-14 DIAGNOSIS — R791 Abnormal coagulation profile: Secondary | ICD-10-CM | POA: Diagnosis not present

## 2020-06-30 DIAGNOSIS — R791 Abnormal coagulation profile: Secondary | ICD-10-CM | POA: Diagnosis not present

## 2020-07-05 ENCOUNTER — Ambulatory Visit: Payer: Medicare Other | Admitting: Sports Medicine

## 2020-07-08 DIAGNOSIS — R791 Abnormal coagulation profile: Secondary | ICD-10-CM | POA: Diagnosis not present

## 2020-07-13 ENCOUNTER — Encounter: Payer: Self-pay | Admitting: Sports Medicine

## 2020-07-13 ENCOUNTER — Other Ambulatory Visit: Payer: Self-pay

## 2020-07-13 ENCOUNTER — Ambulatory Visit (INDEPENDENT_AMBULATORY_CARE_PROVIDER_SITE_OTHER): Payer: Medicare Other | Admitting: Sports Medicine

## 2020-07-13 DIAGNOSIS — M79675 Pain in left toe(s): Secondary | ICD-10-CM

## 2020-07-13 DIAGNOSIS — B351 Tinea unguium: Secondary | ICD-10-CM

## 2020-07-13 DIAGNOSIS — M79674 Pain in right toe(s): Secondary | ICD-10-CM | POA: Diagnosis not present

## 2020-07-13 DIAGNOSIS — IMO0002 Reserved for concepts with insufficient information to code with codable children: Secondary | ICD-10-CM

## 2020-07-13 DIAGNOSIS — E1165 Type 2 diabetes mellitus with hyperglycemia: Secondary | ICD-10-CM

## 2020-07-13 DIAGNOSIS — E1140 Type 2 diabetes mellitus with diabetic neuropathy, unspecified: Secondary | ICD-10-CM | POA: Diagnosis not present

## 2020-07-13 NOTE — Progress Notes (Signed)
Subjective: Shannon Baxter is a 59 y.o. female patient with history of diabetes who presents to office today complaining of long, painful nails while ambulating in shoes; unable to trim. Patient states that the glucose reading this morning was not recorded but ranges between 108-1 28 last A1c 6.1 and saw PCP Dr. Delena Bali few months ago we will see again in November. Patient denies any new changes in medication or new problems except a little soreness at the right great toenail.   Patient Active Problem List   Diagnosis Date Noted   Stroke [facility])    Sleep apnea    PVD (peripheral vascular disease) ([place])    Nutritional and metabolic cardiomyopathy ([place])    Hypertension    Hyperlipidemia    Heart murmur    GERD (gastroesophageal reflux disease)    Diabetes mellitus without complication (HCC)    Claudication (HCC)    Congestive heart failure (CHF) (HCC)    Asthma    Arthritis    Aortic stenosis    Aortic insufficiency    Allergy    Coronary artery disease    Paroxysmal atrial flutter ([place]) 32/95/1884   Chronic systolic congestive heart failure ([place]) 12/06/2016   Morbid obesity ([place]) 12/06/2016   Presence of automatic implantable cardioverter-defibrillator 10/23/2016   Dilated cardiomyopathy ([place]) 09/03/2016   History of mechanical aortic valve replacement 03/29/2016   Paroxysmal atrial fibrillation ([place]) 03/06/2016   Rheumatic aortic valve insufficiency 03/05/2016   Acute on chronic systolic heart failure ([place]) 02/21/2016   Hypothyroidism 01/23/2016   Aortic valve stenosis, rheumatic 11/22/2015   Type 2 diabetes mellitus with diabetic peripheral angiopathy without gangrene, without long-term current use of insulin ([place]) 11/22/2015   Dyspnea on exertion 10/26/2015   Atherosclerosis of native arteries of extremities with intermittent claudication, bilateral legs ([place]) 08/04/2015   Carotid atherosclerosis 07/26/2015   Coronary artery disease involving native  coronary artery of native heart without angina pectoris 07/26/2015   Dyslipidemia 07/26/2015   Essential hypertension 07/26/2015   Intermittent claudication ([place]) 07/26/2015   Peripheral vascular disease ([place]) 07/26/2015   History of tobacco abuse 07/26/2015   Current Outpatient Medications on File Prior to Visit  Medication Sig Dispense Refill   albuterol (PROVENTIL HFA;VENTOLIN HFA) 108 (90 Base) MCG/ACT inhaler Inhale 1-2 puffs into the lungs every 6 (six) hours as needed for wheezing or shortness of breath.     ALPRAZolam (XANAX) 0.5 MG tablet Take 0.5 mg by mouth 3 (three) times daily as needed for anxiety.   0   amiodarone (PACERONE) 200 MG tablet Take 200 mg by mouth daily after breakfast.   0   atorvastatin (LIPITOR) 20 MG tablet Take 20 mg by mouth daily.     carvedilol (COREG) 25 MG tablet TAKE 1 TABLET BY MOUTH TWICE A DAY 180 tablet 3   cefdinir (OMNICEF) 300 MG capsule Take 300 mg by mouth 2 (two) times daily.     clopidogrel (PLAVIX) 75 MG tablet Take 75 mg by mouth daily after breakfast.   6   COMBIVENT RESPIMAT 20-100 MCG/ACT AERS respimat SMARTSIG:1 Puff(s) Via Inhaler 4 Times Daily PRN     Cyanocobalamin (B-12) 500 MCG TABS Take 3 tablets by mouth daily.     ENTRESTO 49-51 MG TAKE 1 TABLET BY MOUTH 2 (TWO) TIMES DAILY. PLEASE MAKE OVERDUE APPT WITH DR. Curt Bears BEFORE ANYMORE REFILLS. 2ND ATTEMPT 180 tablet 0   EPIPEN 2-PAK 0.3 MG/0.3ML SOAJ injection Inject 0.3 mg into the muscle once.   0   ezetimibe (ZETIA)  10 MG tablet Take 10 mg by mouth daily.     fluticasone (FLONASE) 50 MCG/ACT nasal spray Place 2 sprays into both nostrils daily as needed for allergies.      fluticasone furoate-vilanterol (BREO ELLIPTA) 200-25 MCG/INH AEPB Inhale 1 puff into the lungs daily.     furosemide (LASIX) 20 MG tablet TAKE 3 TABLETS (60 MG TOTAL) BY MOUTH 2 (TWO) TIMES DAILY. 540 tablet 2   gabapentin (NEURONTIN) 300 MG capsule Take 300 mg by mouth 3 (three) times daily.    5   glimepiride (AMARYL) 4 MG tablet Take 4 mg by mouth daily with breakfast.      HYDROcodone-acetaminophen (NORCO) 10-325 MG tablet Take 1 tablet by mouth 4 (four) times daily as needed.     ipratropium-albuterol (DUONEB) 0.5-2.5 (3) MG/3ML SOLN Take 3 mLs by nebulization 2 (two) times daily.      levothyroxine (SYNTHROID) 75 MCG tablet Take 75 mcg by mouth daily.     levothyroxine (SYNTHROID) 88 MCG tablet Take 88 mcg by mouth daily.     levothyroxine (SYNTHROID, LEVOTHROID) 50 MCG tablet Take 50 mcg by mouth daily before breakfast.     linaclotide (LINZESS) 72 MCG capsule Take 72 mcg by mouth daily as needed (constipation).     loratadine (CLARITIN) 10 MG tablet Take 10 mg by mouth at bedtime.      metFORMIN (GLUCOPHAGE) 500 MG tablet Take 500 mg by mouth 2 (two) times daily with a meal.      montelukast (SINGULAIR) 10 MG tablet Take 10 mg by mouth at bedtime.      Multiple Vitamins-Minerals (CENTRUM SILVER ULTRA WOMENS) TABS Take 1 tablet by mouth daily.     neomycin-polymyxin-hydrocortisone (CORTISPORIN) OTIC solution Apply twice daily to left 1st toenail 10 mL 0   nystatin (MYCOSTATIN/NYSTOP) powder Apply 1 application topically 3 (three) times daily.     omeprazole (PRILOSEC) 20 MG capsule Take 20 mg by mouth at bedtime.      ondansetron (ZOFRAN-ODT) 4 MG disintegrating tablet Take 4 mg by mouth every 6 (six) hours.     Potassium Chloride ER 20 MEQ TBCR TAKE 1 TABLET BY MOUTH EVERY DAY 90 tablet 2   promethazine (PHENERGAN) 25 MG suppository Place 25 mg rectally every 6 (six) hours as needed.     Promethazine-Codeine 6.25-10 MG/5ML SOLN Take 5 mLs by mouth every 4 (four) hours as needed.     Red Yeast Rice 600 MG TABS Take 2 tablets by mouth daily.     rosuvastatin (CRESTOR) 10 MG tablet TAKE UP TO 1 TABLET DAILY AS TOLERATED 90 tablet 1   warfarin (COUMADIN) 1 MG tablet Take 1 mg by mouth See admin instructions. Take 6 mg on Sunday, Tuesday, and  Thursday . Take 5  mg on all other days     warfarin (COUMADIN) 4 MG tablet Take 4 mg by mouth. Take 1 tablet daily except on WED and SUN when you take 5 mgs     warfarin (COUMADIN) 5 MG tablet Take 5 mg by mouth See admin instructions. Take 6 mg on Sunday, Tuesday, and  Thursday . Take 5 mg on all other days     No current facility-administered medications on file prior to visit.   Allergies  Allergen Reactions   Iodinated Diagnostic Agents Hives and Rash   Ioxaglate Hives    Recent Results (from the past 2160 hour(s))  CUP PACEART REMOTE DEVICE CHECK     Status: None   Collection Time: 05/31/20  5:27 AM  Result Value Ref Range   Date Time Interrogation Session [phone]    Pulse Generator Manufacturer MERM    Pulse Gen Model [id number] Evera MRI XT DR    Pulse Gen Serial Number [id number]    Clinic Name [facility]    Implantable Pulse Generator Type Implantable Cardiac Defibulator    Implantable Pulse Generator Implant Date [id number]    Implantable Lead Manufacturer MERM    Implantable Lead Model 5076 CapSureFix Novus MRI SureScan    Implantable Lead Serial Number [id number]    Implantable Lead Implant Date 95638756    Implantable Lead Location Detail 1 UNKNOWN    Implantable Lead Location [id number]    Implantable Lead Manufacturer [facility]    Implantable Lead Model [id number] Sprint Quattro Secure S MRI SureScan    Implantable Lead Serial Number [id number]    Implantable Lead Implant Date [id number]    Implantable Lead Location Detail 1 UNKNOWN    Implantable Lead Location [id number]    Lead Channel Setting Sensing Sensitivity 0.3 mV   Lead Channel Setting Pacing Amplitude 1.5 V   Lead Channel Setting Pacing Pulse Width 0.4 ms   Lead Channel Setting Pacing Amplitude 2 V   Lead Channel Impedance Value 437 ohm   Lead Channel Sensing Intrinsic Amplitude 2.875 mV   Lead Channel Sensing Intrinsic Amplitude 2.875 mV   Lead Channel Pacing Threshold Amplitude 0.375 V   Lead Channel Pacing Threshold Pulse  Width 0.4 ms   Lead Channel Impedance Value 399 ohm   Lead Channel Impedance Value 323 ohm   Lead Channel Sensing Intrinsic Amplitude 23.125 mV   Lead Channel Sensing Intrinsic Amplitude 23.125 mV   Lead Channel Pacing Threshold Amplitude 1 V   Lead Channel Pacing Threshold Pulse Width 0.4 ms   HighPow Impedance 66 ohm   Battery Status OK    Battery Remaining Longevity 89 mo   Battery Voltage 2.99 V   Brady Statistic RA Percent Paced 1.16 %   Brady Statistic RV Percent Paced 0.04 %   Brady Statistic AP VP Percent 0 %   Brady Statistic AS VP Percent 0.04 %   Brady Statistic AP VS Percent 1.16 %   Brady Statistic AS VS Percent 06.3 %  Basic metabolic panel     Status: Abnormal   Collection Time: 06/03/20  1:56 PM  Result Value Ref Range   Glucose 151 (H) 65 - 99 mg/dL   BUN 17 6 - 24 mg/dL   Creatinine, Ser 0.92 0.57 - 1.00 mg/dL   GFR calc non Af Amer 69 >59 mL/min/1.73   GFR calc Af Amer 79 >59 mL/min/1.73    Comment: **[facility] currently reports eGFR in compliance with the current**   recommendations of the [employer]. [facility] will   update reporting as new guidelines are published from the NKF-ASN   Task force.    BUN/Creatinine Ratio 18 9 - 23   Sodium 139 134 - 144 mmol/L   Potassium 4.1 3.5 - 5.2 mmol/L   Chloride 102 96 - 106 mmol/L   CO2 23 20 - 29 mmol/L   Calcium 8.9 8.7 - 10.2 mg/dL    Objective: General: Patient is awake, alert, and oriented x 3 and in no acute distress.  Integument: Skin is warm, dry and supple bilateral. Nails are tender, long, thickened and dystrophic with subungual debris, consistent with onychomycosis, 1-5 bilateral minimal ingrowing on the right great toe.  No signs of infection. No open lesions or preulcerative lesions  present bilateral. Remaining integument unremarkable.  Vasculature:  Dorsalis Pedis pulse 1/4 bilateral. Posterior Tibial pulse  0/4 bilateral. Capillary fill time <3 sec 1-5 bilateral. Positive hair growth to  the level of the digits.Temperature gradient within normal limits. No varicosities present bilateral. No edema present bilateral.   Neurology: The patient has intact sensation measured with a 5.07/10g Semmes Weinstein Monofilament at all pedal sites bilateral. Vibratory sensation diminished bilateral with tuning fork. No Babinski sign present bilateral.   Musculoskeletal:Asymptomatic pes planus pedal deformities noted bilateral. Muscular strength 5/5 in all lower extremity muscular groups bilateral. No tenderness with calf compression bilateral.  Assessment and Plan: Problem List Items Addressed This Visit    None    Visit Diagnoses    Pain due to onychomycosis of toenails of both feet    -  Primary   Type 2 diabetes, uncontrolled, with neuropathy ([place])         -Examined patient. -Re-Discussed and educated patient on diabetic foot care -Mechanically debrided all nails 1-5 bilateral using sterile nail nipper and filed with dremel without incident  -Patient to return  in 10 to 12 weeks for at risk foot care -Patient advised to call the office if any problems or questions arise in the meantime.  Landis Martins, DPM

## 2020-07-15 DIAGNOSIS — G4733 Obstructive sleep apnea (adult) (pediatric): Secondary | ICD-10-CM | POA: Diagnosis not present

## 2020-07-18 DIAGNOSIS — M1711 Unilateral primary osteoarthritis, right knee: Secondary | ICD-10-CM | POA: Diagnosis not present

## 2020-07-18 DIAGNOSIS — R110 Nausea: Secondary | ICD-10-CM | POA: Diagnosis not present

## 2020-07-21 DIAGNOSIS — E119 Type 2 diabetes mellitus without complications: Secondary | ICD-10-CM | POA: Diagnosis not present

## 2020-07-22 DIAGNOSIS — R791 Abnormal coagulation profile: Secondary | ICD-10-CM | POA: Diagnosis not present

## 2020-07-27 DIAGNOSIS — H5213 Myopia, bilateral: Secondary | ICD-10-CM | POA: Diagnosis not present

## 2020-08-04 ENCOUNTER — Other Ambulatory Visit: Payer: Self-pay | Admitting: Cardiology

## 2020-08-05 ENCOUNTER — Other Ambulatory Visit: Payer: Medicare Other

## 2020-08-12 DIAGNOSIS — R791 Abnormal coagulation profile: Secondary | ICD-10-CM | POA: Diagnosis not present

## 2020-08-17 DIAGNOSIS — M1711 Unilateral primary osteoarthritis, right knee: Secondary | ICD-10-CM | POA: Diagnosis not present

## 2020-08-18 DIAGNOSIS — R791 Abnormal coagulation profile: Secondary | ICD-10-CM | POA: Diagnosis not present

## 2020-08-18 DIAGNOSIS — E785 Hyperlipidemia, unspecified: Secondary | ICD-10-CM | POA: Diagnosis not present

## 2020-08-18 DIAGNOSIS — E039 Hypothyroidism, unspecified: Secondary | ICD-10-CM | POA: Diagnosis not present

## 2020-08-18 DIAGNOSIS — I110 Hypertensive heart disease with heart failure: Secondary | ICD-10-CM | POA: Diagnosis not present

## 2020-08-18 DIAGNOSIS — E1140 Type 2 diabetes mellitus with diabetic neuropathy, unspecified: Secondary | ICD-10-CM | POA: Diagnosis not present

## 2020-08-18 DIAGNOSIS — E1165 Type 2 diabetes mellitus with hyperglycemia: Secondary | ICD-10-CM | POA: Diagnosis not present

## 2020-08-18 DIAGNOSIS — Z79899 Other long term (current) drug therapy: Secondary | ICD-10-CM | POA: Diagnosis not present

## 2020-08-25 ENCOUNTER — Ambulatory Visit (INDEPENDENT_AMBULATORY_CARE_PROVIDER_SITE_OTHER): Payer: Medicare Other

## 2020-08-25 ENCOUNTER — Other Ambulatory Visit: Payer: Self-pay

## 2020-08-25 DIAGNOSIS — I5023 Acute on chronic systolic (congestive) heart failure: Secondary | ICD-10-CM

## 2020-08-25 DIAGNOSIS — R791 Abnormal coagulation profile: Secondary | ICD-10-CM | POA: Diagnosis not present

## 2020-08-25 LAB — ECHOCARDIOGRAM COMPLETE
AR max vel: 0.79 cm2
AV Area VTI: 0.92 cm2
AV Area mean vel: 0.84 cm2
AV Mean grad: 16 mmHg
AV Peak grad: 32.3 mmHg
Ao pk vel: 2.84 m/s
Area-P 1/2: 4.68 cm2
Calc EF: 39.3 %
S' Lateral: 5.6 cm
Single Plane A2C EF: 36.6 %
Single Plane A4C EF: 36.8 %

## 2020-08-25 NOTE — Progress Notes (Signed)
Complete echocardiogram performed.  Jimmy Estellar Cadena RDCS, RVT  

## 2020-08-26 ENCOUNTER — Other Ambulatory Visit: Payer: Self-pay | Admitting: Cardiology

## 2020-08-30 ENCOUNTER — Ambulatory Visit (INDEPENDENT_AMBULATORY_CARE_PROVIDER_SITE_OTHER): Payer: Medicare Other

## 2020-08-30 DIAGNOSIS — I4892 Unspecified atrial flutter: Secondary | ICD-10-CM

## 2020-08-31 LAB — CUP PACEART REMOTE DEVICE CHECK
Battery Remaining Longevity: 87 mo
Battery Voltage: 2.98 V
Brady Statistic AP VP Percent: 0.01 %
Brady Statistic AP VS Percent: 3.82 %
Brady Statistic AS VP Percent: 0.05 %
Brady Statistic AS VS Percent: 96.13 %
Brady Statistic RA Percent Paced: 3.82 %
Brady Statistic RV Percent Paced: 0.05 %
Date Time Interrogation Session: 20211109213523
HighPow Impedance: 78 Ohm
Implantable Lead Implant Date: 20180102
Implantable Lead Implant Date: 20180102
Implantable Lead Location: 753859
Implantable Lead Location: 753860
Implantable Lead Model: 5076
Implantable Pulse Generator Implant Date: 20180102
Lead Channel Impedance Value: 342 Ohm
Lead Channel Impedance Value: 437 Ohm
Lead Channel Impedance Value: 437 Ohm
Lead Channel Pacing Threshold Amplitude: 0.375 V
Lead Channel Pacing Threshold Amplitude: 1.125 V
Lead Channel Pacing Threshold Pulse Width: 0.4 ms
Lead Channel Pacing Threshold Pulse Width: 0.4 ms
Lead Channel Sensing Intrinsic Amplitude: 3.125 mV
Lead Channel Sensing Intrinsic Amplitude: 3.125 mV
Lead Channel Sensing Intrinsic Amplitude: 31.25 mV
Lead Channel Sensing Intrinsic Amplitude: 31.25 mV
Lead Channel Setting Pacing Amplitude: 1.5 V
Lead Channel Setting Pacing Amplitude: 2.5 V
Lead Channel Setting Pacing Pulse Width: 0.4 ms
Lead Channel Setting Sensing Sensitivity: 0.3 mV

## 2020-09-01 DIAGNOSIS — R791 Abnormal coagulation profile: Secondary | ICD-10-CM | POA: Diagnosis not present

## 2020-09-01 NOTE — Progress Notes (Signed)
Remote ICD transmission.   

## 2020-09-08 DIAGNOSIS — J301 Allergic rhinitis due to pollen: Secondary | ICD-10-CM | POA: Diagnosis not present

## 2020-09-08 DIAGNOSIS — J4530 Mild persistent asthma, uncomplicated: Secondary | ICD-10-CM | POA: Diagnosis not present

## 2020-09-08 DIAGNOSIS — J208 Acute bronchitis due to other specified organisms: Secondary | ICD-10-CM | POA: Diagnosis not present

## 2020-09-08 DIAGNOSIS — B9689 Other specified bacterial agents as the cause of diseases classified elsewhere: Secondary | ICD-10-CM | POA: Diagnosis not present

## 2020-09-08 DIAGNOSIS — G4733 Obstructive sleep apnea (adult) (pediatric): Secondary | ICD-10-CM | POA: Diagnosis not present

## 2020-09-08 DIAGNOSIS — R791 Abnormal coagulation profile: Secondary | ICD-10-CM | POA: Diagnosis not present

## 2020-09-17 ENCOUNTER — Other Ambulatory Visit: Payer: Self-pay | Admitting: Cardiology

## 2020-09-19 ENCOUNTER — Telehealth: Payer: Self-pay | Admitting: Cardiology

## 2020-09-19 DIAGNOSIS — R791 Abnormal coagulation profile: Secondary | ICD-10-CM | POA: Diagnosis not present

## 2020-09-19 NOTE — Telephone Encounter (Signed)
Left message for patient to return call.

## 2020-09-19 NOTE — Telephone Encounter (Signed)
Pt requests refill on medication she cannot remember the name, says if we will give her a call she will have the bottle in her hand and further assist. Thank you/kbl

## 2020-09-21 ENCOUNTER — Ambulatory Visit (INDEPENDENT_AMBULATORY_CARE_PROVIDER_SITE_OTHER): Payer: Medicare Other | Admitting: Sports Medicine

## 2020-09-21 ENCOUNTER — Other Ambulatory Visit: Payer: Self-pay

## 2020-09-21 ENCOUNTER — Encounter: Payer: Self-pay | Admitting: Sports Medicine

## 2020-09-21 DIAGNOSIS — M79674 Pain in right toe(s): Secondary | ICD-10-CM

## 2020-09-21 DIAGNOSIS — B351 Tinea unguium: Secondary | ICD-10-CM | POA: Diagnosis not present

## 2020-09-21 DIAGNOSIS — E1165 Type 2 diabetes mellitus with hyperglycemia: Secondary | ICD-10-CM | POA: Diagnosis not present

## 2020-09-21 DIAGNOSIS — IMO0002 Reserved for concepts with insufficient information to code with codable children: Secondary | ICD-10-CM

## 2020-09-21 DIAGNOSIS — M79675 Pain in left toe(s): Secondary | ICD-10-CM | POA: Diagnosis not present

## 2020-09-21 DIAGNOSIS — E1140 Type 2 diabetes mellitus with diabetic neuropathy, unspecified: Secondary | ICD-10-CM | POA: Diagnosis not present

## 2020-09-21 NOTE — Progress Notes (Signed)
Subjective: Shannon Baxter is a 59 y.o. female patient with history of diabetes who presents to office today complaining of long, painful nails while ambulating in shoes; unable to trim. Patient states that the glucose reading this morning was 103 and last A1c 5.8 and saw PCP Dr. Tomasa Blase 2 weeks ago.  No other pedal complaints noted.  Patient Active Problem List   Diagnosis Date Noted  . Stroke (HCC)   . Sleep apnea   . PVD (peripheral vascular disease) (HCC)   . Nutritional and metabolic cardiomyopathy (HCC)   . Hypertension   . Hyperlipidemia   . Heart murmur   . GERD (gastroesophageal reflux disease)   . Diabetes mellitus without complication (HCC)   . Claudication (HCC)   . Congestive heart failure (CHF) (HCC)   . Asthma   . Arthritis   . Aortic stenosis   . Aortic insufficiency   . Allergy   . Coronary artery disease   . Paroxysmal atrial flutter (HCC) 09/01/2018  . Chronic systolic congestive heart failure (HCC) 12/06/2016  . Morbid obesity (HCC) 12/06/2016  . Presence of automatic implantable cardioverter-defibrillator 10/23/2016  . Dilated cardiomyopathy (HCC) 09/03/2016  . History of mechanical aortic valve replacement 03/29/2016  . Paroxysmal atrial fibrillation (HCC) 03/06/2016  . Rheumatic aortic valve insufficiency 03/05/2016  . Acute on chronic systolic heart failure (HCC) 02/21/2016  . Hypothyroidism 01/23/2016  . Aortic valve stenosis, rheumatic 11/22/2015  . Type 2 diabetes mellitus with diabetic peripheral angiopathy without gangrene, without long-term current use of insulin (HCC) 11/22/2015  . Dyspnea on exertion 10/26/2015  . Atherosclerosis of native arteries of extremities with intermittent claudication, bilateral legs (HCC) 08/04/2015  . Carotid atherosclerosis 07/26/2015  . Coronary artery disease involving native coronary artery of native heart without angina pectoris 07/26/2015  . Dyslipidemia 07/26/2015  . Essential hypertension 07/26/2015  .  Intermittent claudication (HCC) 07/26/2015  . Peripheral vascular disease (HCC) 07/26/2015  . History of tobacco abuse 07/26/2015   Current Outpatient Medications on File Prior to Visit  Medication Sig Dispense Refill  . albuterol (PROVENTIL HFA;VENTOLIN HFA) 108 (90 Base) MCG/ACT inhaler Inhale 1-2 puffs into the lungs every 6 (six) hours as needed for wheezing or shortness of breath.    . ALPRAZolam (XANAX) 0.5 MG tablet Take 0.5 mg by mouth 3 (three) times daily as needed for anxiety.   0  . amiodarone (PACERONE) 200 MG tablet Take 200 mg by mouth daily after breakfast.   0  . atorvastatin (LIPITOR) 20 MG tablet Take 20 mg by mouth daily.    . carvedilol (COREG) 25 MG tablet TAKE 1 TABLET BY MOUTH TWICE A DAY 180 tablet 3  . cefdinir (OMNICEF) 300 MG capsule Take 300 mg by mouth 2 (two) times daily.    . clopidogrel (PLAVIX) 75 MG tablet Take 75 mg by mouth daily after breakfast.   6  . COMBIVENT RESPIMAT 20-100 MCG/ACT AERS respimat SMARTSIG:1 Puff(s) Via Inhaler 4 Times Daily PRN    . Cyanocobalamin (B-12) 500 MCG TABS Take 3 tablets by mouth daily.    Marland Kitchen EPIPEN 2-PAK 0.3 MG/0.3ML SOAJ injection Inject 0.3 mg into the muscle once.   0  . ezetimibe (ZETIA) 10 MG tablet Take 10 mg by mouth daily.    . fluticasone (FLONASE) 50 MCG/ACT nasal spray Place 2 sprays into both nostrils daily as needed for allergies.     . fluticasone furoate-vilanterol (BREO ELLIPTA) 200-25 MCG/INH AEPB Inhale 1 puff into the lungs daily.    . furosemide (  LASIX) 20 MG tablet TAKE 3 TABLETS (60 MG TOTAL) BY MOUTH 2 (TWO) TIMES DAILY. 540 tablet 2  . gabapentin (NEURONTIN) 300 MG capsule Take 300 mg by mouth 3 (three) times daily.   5  . glimepiride (AMARYL) 4 MG tablet Take 4 mg by mouth daily with breakfast.     . HYDROcodone-acetaminophen (NORCO) 10-325 MG tablet Take 1 tablet by mouth 4 (four) times daily as needed.    Marland Kitchen ipratropium-albuterol (DUONEB) 0.5-2.5 (3) MG/3ML SOLN Take 3 mLs by nebulization 2 (two)  times daily.     Marland Kitchen levothyroxine (SYNTHROID) 75 MCG tablet Take 75 mcg by mouth daily.    Marland Kitchen levothyroxine (SYNTHROID) 88 MCG tablet Take 88 mcg by mouth daily.    Marland Kitchen levothyroxine (SYNTHROID, LEVOTHROID) 50 MCG tablet Take 50 mcg by mouth daily before breakfast.    . linaclotide (LINZESS) 72 MCG capsule Take 72 mcg by mouth daily as needed (constipation).    Marland Kitchen loratadine (CLARITIN) 10 MG tablet Take 10 mg by mouth at bedtime.     . metFORMIN (GLUCOPHAGE) 500 MG tablet Take 500 mg by mouth 2 (two) times daily with a meal.     . montelukast (SINGULAIR) 10 MG tablet Take 10 mg by mouth at bedtime.     . Multiple Vitamins-Minerals (CENTRUM SILVER ULTRA WOMENS) TABS Take 1 tablet by mouth daily.    Marland Kitchen neomycin-polymyxin-hydrocortisone (CORTISPORIN) OTIC solution Apply twice daily to left 1st toenail 10 mL 0  . nystatin (MYCOSTATIN/NYSTOP) powder Apply 1 application topically 3 (three) times daily.    Marland Kitchen omeprazole (PRILOSEC) 20 MG capsule Take 20 mg by mouth at bedtime.     . ondansetron (ZOFRAN-ODT) 4 MG disintegrating tablet Take 4 mg by mouth every 6 (six) hours.    . Potassium Chloride ER 20 MEQ TBCR TAKE 1 TABLET BY MOUTH EVERY DAY 90 tablet 2  . promethazine (PHENERGAN) 25 MG suppository Place 25 mg rectally every 6 (six) hours as needed.    . Promethazine-Codeine 6.25-10 MG/5ML SOLN Take 5 mLs by mouth every 4 (four) hours as needed.    . Red Yeast Rice 600 MG TABS Take 2 tablets by mouth daily.    . rosuvastatin (CRESTOR) 10 MG tablet TAKE UP TO 1 TABLET DAILY AS TOLERATED 90 tablet 1  . sacubitril-valsartan (ENTRESTO) 49-51 MG Take 1 tablet by mouth 2 (two) times daily. 180 tablet 2  . warfarin (COUMADIN) 1 MG tablet Take 1 mg by mouth See admin instructions. Take 6 mg on Sunday, Tuesday, and  Thursday . Take 5 mg on all other days    . warfarin (COUMADIN) 4 MG tablet Take 4 mg by mouth. Take 1 tablet daily except on WED and SUN when you take 5 mgs    . warfarin (COUMADIN) 5 MG tablet Take 5 mg  by mouth See admin instructions. Take 6 mg on Sunday, Tuesday, and  Thursday . Take 5 mg on all other days     No current facility-administered medications on file prior to visit.   Allergies  Allergen Reactions  . Iodinated Diagnostic Agents Hives and Rash  . Ioxaglate Hives    Recent Results (from the past 2160 hour(s))  ECHOCARDIOGRAM COMPLETE     Status: None   Collection Time: 08/25/20 11:04 AM  Result Value Ref Range   S' Lateral 5.60 cm   Single Plane A4C EF 36.8 %   Area-P 1/2 4.68 cm2   Single Plane A2C EF 36.6 %   Calc EF  39.3 %   AV Area mean vel 0.84 cm2   AV Area VTI 0.92 cm2   AR max vel 0.79 cm2   AV Mean grad 16.0 mmHg   Ao pk vel 2.84 m/s   AV Peak grad 32.3 mmHg  CUP PACEART REMOTE DEVICE CHECK     Status: None   Collection Time: 08/30/20  9:35 PM  Result Value Ref Range   Date Time Interrogation Session [id number]    Pulse Generator Manufacturer MERM    Pulse Gen Model [id number] Evera MRI XT DR    Pulse Gen Serial Number [id number]    Clinic Name [facility]    Implantable Pulse Generator Type Implantable Cardiac Defibulator    Implantable Pulse Generator Implant Date [id number]    Implantable Lead Manufacturer MERM    Implantable Lead Model 5076 CapSureFix Novus MRI SureScan    Implantable Lead Serial Number [id number]    Implantable Lead Implant Date 91660600    Implantable Lead Location Detail 1 UNKNOWN    Implantable Lead Location [id number]    Implantable Lead Manufacturer [facility]    Implantable Lead Model [id number] Sprint Quattro Secure S MRI SureScan    Implantable Lead Serial Number [id number]    Implantable Lead Implant Date [id number]    Implantable Lead Location Detail 1 UNKNOWN    Implantable Lead Location [id number]    Lead Channel Setting Sensing Sensitivity 0.3 mV   Lead Channel Setting Pacing Amplitude 1.5 V   Lead Channel Setting Pacing Pulse Width 0.4 ms   Lead Channel Setting Pacing Amplitude 2.5 V   Lead Channel Impedance Value 437 ohm    Lead Channel Sensing Intrinsic Amplitude 3.125 mV   Lead Channel Sensing Intrinsic Amplitude 3.125 mV   Lead Channel Pacing Threshold Amplitude 0.375 V   Lead Channel Pacing Threshold Pulse Width 0.4 ms   Lead Channel Impedance Value 437 ohm   Lead Channel Impedance Value 342 ohm   Lead Channel Sensing Intrinsic Amplitude 31.25 mV   Lead Channel Sensing Intrinsic Amplitude 31.25 mV   Lead Channel Pacing Threshold Amplitude 1.125 V   Lead Channel Pacing Threshold Pulse Width 0.4 ms   HighPow Impedance 78 ohm   Battery Status OK    Battery Remaining Longevity 87 mo   Battery Voltage 2.98 V   Brady Statistic RA Percent Paced 3.82 %   Brady Statistic RV Percent Paced 0.05 %   Brady Statistic AP VP Percent 0.01 %   Brady Statistic AS VP Percent 0.05 %   Brady Statistic AP VS Percent 3.82 %   Brady Statistic AS VS Percent 96.13 %    Objective: General: Patient is awake, alert, and oriented x 3 and in no acute distress.  Integument: Skin is warm, dry and supple bilateral. Nails are tender, long, thickened and dystrophic with subungual debris, consistent with onychomycosis, 1-5 bilateral minimal ingrowing on the right great toe.  No signs of infection. No open lesions or preulcerative lesions present bilateral. Remaining integument unremarkable.  Vasculature:  Dorsalis Pedis pulse 1/4 bilateral. Posterior Tibial pulse  0/4 bilateral. Capillary fill time <3 sec 1-5 bilateral. Positive hair growth to the level of the digits.Temperature gradient within normal limits. No varicosities present bilateral. No edema present bilateral.   Neurology: The patient has intact sensation measured with a 5.07/10g Semmes Weinstein Monofilament at all pedal sites bilateral. Vibratory sensation diminished bilateral with tuning fork. No Babinski sign present bilateral.   Musculoskeletal:Asymptomatic pes planus pedal deformities noted bilateral. Muscular strength 5/5 in all  lower extremity muscular groups bilateral.  No tenderness with calf compression bilateral.  Assessment and Plan: Problem List Items Addressed This Visit    None    Visit Diagnoses    Pain due to onychomycosis of toenails of both feet    -  Primary   Type 2 diabetes, uncontrolled, with neuropathy (HCC)         -Examined patient. -Re-Discussed and educated patient on diabetic foot care -Mechanically debrided all nails 1-5 bilateral using sterile nail nipper and filed with dremel without incident  -Patient to return  in 10 to 12 weeks for at risk foot care like previous -Continue with good supportive shoes daily for foot type -Patient advised to call the office if any problems or questions arise in the meantime.  [name], DPM

## 2020-09-22 NOTE — Telephone Encounter (Signed)
Called patient. She has already received refill. No further needs at this time.

## 2020-09-26 DIAGNOSIS — R791 Abnormal coagulation profile: Secondary | ICD-10-CM | POA: Diagnosis not present

## 2020-10-03 DIAGNOSIS — R791 Abnormal coagulation profile: Secondary | ICD-10-CM | POA: Diagnosis not present

## 2020-10-10 DIAGNOSIS — R791 Abnormal coagulation profile: Secondary | ICD-10-CM | POA: Diagnosis not present

## 2020-10-17 DIAGNOSIS — B9689 Other specified bacterial agents as the cause of diseases classified elsewhere: Secondary | ICD-10-CM | POA: Diagnosis not present

## 2020-10-17 DIAGNOSIS — J208 Acute bronchitis due to other specified organisms: Secondary | ICD-10-CM | POA: Diagnosis not present

## 2020-10-18 DIAGNOSIS — G4733 Obstructive sleep apnea (adult) (pediatric): Secondary | ICD-10-CM | POA: Diagnosis not present

## 2020-10-31 DIAGNOSIS — R791 Abnormal coagulation profile: Secondary | ICD-10-CM | POA: Diagnosis not present

## 2020-11-04 DIAGNOSIS — I509 Heart failure, unspecified: Secondary | ICD-10-CM | POA: Insufficient documentation

## 2020-11-08 ENCOUNTER — Ambulatory Visit: Payer: Medicare Other | Admitting: Cardiology

## 2020-11-10 DIAGNOSIS — R791 Abnormal coagulation profile: Secondary | ICD-10-CM | POA: Diagnosis not present

## 2020-11-12 ENCOUNTER — Other Ambulatory Visit: Payer: Self-pay | Admitting: Cardiology

## 2020-11-14 NOTE — Telephone Encounter (Signed)
Refill sent to pharmacy.   

## 2020-11-16 ENCOUNTER — Other Ambulatory Visit: Payer: Self-pay

## 2020-11-16 ENCOUNTER — Encounter: Payer: Self-pay | Admitting: Cardiology

## 2020-11-16 ENCOUNTER — Ambulatory Visit (INDEPENDENT_AMBULATORY_CARE_PROVIDER_SITE_OTHER): Payer: Medicare Other | Admitting: Cardiology

## 2020-11-16 VITALS — BP 154/78 | HR 76 | Ht 72.0 in | Wt 348.0 lb

## 2020-11-16 DIAGNOSIS — I480 Paroxysmal atrial fibrillation: Secondary | ICD-10-CM | POA: Diagnosis not present

## 2020-11-16 DIAGNOSIS — Z95810 Presence of automatic (implantable) cardiac defibrillator: Secondary | ICD-10-CM

## 2020-11-16 DIAGNOSIS — I739 Peripheral vascular disease, unspecified: Secondary | ICD-10-CM | POA: Diagnosis not present

## 2020-11-16 DIAGNOSIS — I420 Dilated cardiomyopathy: Secondary | ICD-10-CM

## 2020-11-16 DIAGNOSIS — I2510 Atherosclerotic heart disease of native coronary artery without angina pectoris: Secondary | ICD-10-CM | POA: Diagnosis not present

## 2020-11-16 NOTE — Patient Instructions (Signed)
Medication Instructions:  Your physician recommends that you continue on your current medications as directed. Please refer to the Current Medication list given to you today.  *If you need a refill on your cardiac medications before your next appointment, please call your pharmacy*   Lab Work: Your physician recommends that you return for lab today for lipid panel  If you have labs (blood work) drawn today and your tests are completely normal, you will receive your results only by: Marland Kitchen MyChart Message (if you have MyChart) OR . A paper copy in the mail If you have any lab test that is abnormal or we need to change your treatment, we will call you to review the results.   Testing/Procedures: NONE   Follow-Up: At [facility], you and your health needs are our priority.  As part of our continuing mission to provide you with exceptional heart care, we have created designated Provider Care Teams.  These Care Teams include your primary Cardiologist (physician) and Advanced Practice Providers (APPs -  Physician Assistants and Nurse Practitioners) who all work together to provide you with the care you need, when you need it.  We recommend signing up for the patient portal called "MyChart".  Sign up information is provided on this After Visit Summary.  MyChart is used to connect with patients for Virtual Visits (Telemedicine).  Patients are able to view lab/test results, encounter notes, upcoming appointments, etc.  Non-urgent messages can be sent to your provider as well.   To learn more about what you can do with MyChart, go to [email or web address].    Your next appointment:   6 month(s)  The format for your next appointment:   In Person  Provider:   Gypsy Balsam, MD   Other Instructions

## 2020-11-16 NOTE — Progress Notes (Signed)
Cardiology Office Note:    Date:  11/16/2020   ID:  Shannon Baxter, DOB 1960-11-05, MRN [id number]  PCP:  Paulina Fusi, MD  Cardiologist:  Gypsy Balsam, MD    Referring MD: Paulina Fusi, MD   Chief Complaint  Patient presents with  . Follow-up    History of Present Illness:    Shannon Baxter is a 60 y.o. female past medical history significant for aortic valve replacement with mechanical Saint Jude 23 mm prosthesis that was done at [facility], paroxysmal atrial fibrillation/flutter, cardiomyopathy with ejection fraction 40 to 45% assess on echocardiogram from November 2021, essential hypertension, diabetes, dyslipidemia. Comes today 2 months of follow-up overall seems to be doing well.  Denies have any chest pain tightness squeezing pressure burning chest.  The biggest problem she had lower extremities that bothering her she did see Dr. Jeralene Huff for evaluation for possible peripheral vascular disease in lower extremities she was told nothing can be done about it.  She is walking using cane and doing relatively well considering.  Past Medical History:  Diagnosis Date  . Acute on chronic systolic heart failure (HCC) 02/21/2016  . Allergy   . Aortic insufficiency   . Aortic stenosis   . Aortic valve stenosis, rheumatic 11/22/2015   Overview:  Appears to be severe basal latest echocardiogram peak gradient of 69 mmHg mean of 45 mmHg, calculated aortic valve area of 0.67 cm  . Arthritis   . Asthma   . Atherosclerosis of native arteries of extremities with intermittent claudication, bilateral legs (HCC) 08/04/2015  . Carotid atherosclerosis 07/26/2015  . CHF (congestive heart failure) (HCC)   . Chronic systolic congestive heart failure (HCC) 12/06/2016  . Claudication (HCC)   . Congestive heart failure (CHF) (HCC)   . Coronary artery disease   . Coronary artery disease involving native coronary artery of native heart without angina pectoris 07/26/2015  . Diabetes mellitus without  complication (HCC)   . Dilated cardiomyopathy (HCC) 09/03/2016   Overview:  Ejection fraction 20-25%  . Dyslipidemia 07/26/2015  . Dyspnea on exertion 10/26/2015  . Essential hypertension 07/26/2015  . GERD (gastroesophageal reflux disease)   . Heart murmur   . History of mechanical aortic valve replacement 03/29/2016   Overview:  May 2017. St Judes 23 mm  Formatting of this note might be different from the original. Overview:  May 2017. St Judes 23 mm Formatting of this note might be different from the original. May 2017. St Judes 23 mm  . History of tobacco abuse 07/26/2015  . Hyperlipidemia   . Hypertension   . Hypothyroidism 01/23/2016  . Intermittent claudication (HCC) 07/26/2015  . Morbid obesity (HCC) 12/06/2016  . Nutritional and metabolic cardiomyopathy (HCC)   . Paroxysmal atrial fibrillation (HCC) 03/06/2016  . Paroxysmal atrial flutter (HCC) 09/01/2018  . Peripheral vascular disease (HCC) 07/26/2015  . Presence of automatic implantable cardioverter-defibrillator 10/23/2016  . PVD (peripheral vascular disease) (HCC)   . Rheumatic aortic valve insufficiency 03/05/2016  . Sleep apnea   . Stroke (HCC)   . Type 2 diabetes mellitus with diabetic peripheral angiopathy without gangrene, without long-term current use of insulin (HCC) 11/22/2015    Past Surgical History:  Procedure Laterality Date  . A-FLUTTER ABLATION N/A 11/05/2018   Procedure: A-FLUTTER ABLATION;  Surgeon: Regan Lemming, MD;  Location: [facility] INVASIVE CV [facility];  Service: Cardiovascular;  Laterality: N/A;  . CARDIAC CATHETERIZATION    . INSERT / REPLACE / REMOVE PACEMAKER     Medtronic ICD  .  MECHANICAL AORTIC VALVE REPLACEMENT    . PERIPHERAL ARTERIAL STENT GRAFT    . TUBAL LIGATION      Current Medications: Current Meds  Medication Sig  . albuterol (PROVENTIL HFA;VENTOLIN HFA) 108 (90 Base) MCG/ACT inhaler Inhale 1-2 puffs into the lungs every 6 (six) hours as needed for wheezing or shortness of breath.  . ALPRAZolam  (XANAX) 0.5 MG tablet Take 0.5 mg by mouth 3 (three) times daily as needed for anxiety.   Marland Kitchen amiodarone (PACERONE) 200 MG tablet Take 200 mg by mouth daily after breakfast.   . carvedilol (COREG) 25 MG tablet TAKE 1 TABLET BY MOUTH TWICE A DAY  . cefdinir (OMNICEF) 300 MG capsule Take 300 mg by mouth 2 (two) times daily.  . clopidogrel (PLAVIX) 75 MG tablet Take 75 mg by mouth daily after breakfast.   . COMBIVENT RESPIMAT 20-100 MCG/ACT AERS respimat SMARTSIG:1 Puff(s) Via Inhaler 4 Times Daily PRN  . Cyanocobalamin (B-12) 500 MCG TABS Take 3 tablets by mouth daily.  Marland Kitchen EPIPEN 2-PAK 0.3 MG/0.3ML SOAJ injection Inject 0.3 mg into the muscle once.   . ezetimibe (ZETIA) 10 MG tablet Take 10 mg by mouth daily.  . fluticasone (FLONASE) 50 MCG/ACT nasal spray Place 2 sprays into both nostrils daily as needed for allergies.   . fluticasone furoate-vilanterol (BREO ELLIPTA) 200-25 MCG/INH AEPB Inhale 1 puff into the lungs daily.  . furosemide (LASIX) 20 MG tablet TAKE 3 TABLETS (60 MG TOTAL) BY MOUTH 2 (TWO) TIMES DAILY.  Marland Kitchen gabapentin (NEURONTIN) 300 MG capsule Take 300 mg by mouth 3 (three) times daily.   Marland Kitchen glimepiride (AMARYL) 4 MG tablet Take 4 mg by mouth daily with breakfast.   . HYDROcodone-acetaminophen (NORCO) 10-325 MG tablet Take 1 tablet by mouth 4 (four) times daily as needed.  Marland Kitchen ipratropium-albuterol (DUONEB) 0.5-2.5 (3) MG/3ML SOLN Take 3 mLs by nebulization 2 (two) times daily.   Marland Kitchen levothyroxine (SYNTHROID) 88 MCG tablet Take 88 mcg by mouth daily.  Marland Kitchen linaclotide (LINZESS) 72 MCG capsule Take 72 mcg by mouth daily as needed (constipation).  Marland Kitchen loratadine (CLARITIN) 10 MG tablet Take 10 mg by mouth at bedtime.   . metFORMIN (GLUCOPHAGE) 500 MG tablet Take 500 mg by mouth 2 (two) times daily with a meal.   . montelukast (SINGULAIR) 10 MG tablet Take 10 mg by mouth at bedtime.   . Multiple Vitamins-Minerals (CENTRUM SILVER ULTRA WOMENS) TABS Take 1 tablet by mouth daily.  Marland Kitchen  neomycin-polymyxin-hydrocortisone (CORTISPORIN) OTIC solution Apply twice daily to left 1st toenail  . nystatin (MYCOSTATIN/NYSTOP) powder Apply 1 application topically 3 (three) times daily.  Marland Kitchen omeprazole (PRILOSEC) 20 MG capsule Take 20 mg by mouth at bedtime.   . ondansetron (ZOFRAN-ODT) 4 MG disintegrating tablet Take 4 mg by mouth every 6 (six) hours.  . Potassium Chloride ER 20 MEQ TBCR TAKE 1 TABLET BY MOUTH EVERY DAY  . promethazine (PHENERGAN) 25 MG suppository Place 25 mg rectally every 6 (six) hours as needed.  . Promethazine-Codeine 6.25-10 MG/5ML SOLN Take 5 mLs by mouth every 4 (four) hours as needed.  . Red Yeast Rice 600 MG TABS Take 2 tablets by mouth daily.  . rosuvastatin (CRESTOR) 10 MG tablet TAKE UP TO 1 TABLET DAILY AS TOLERATED  . sacubitril-valsartan (ENTRESTO) 49-51 MG Take 1 tablet by mouth 2 (two) times daily.  Marland Kitchen warfarin (COUMADIN) 4 MG tablet Take 4 mg by mouth. Take 1 tablet daily except on WED and SUN when you take 5 mgs  .  warfarin (COUMADIN) 5 MG tablet Take 5 mg by mouth See admin instructions. Take 6 mg on Sunday, Tuesday, and  Thursday . Take 5 mg on all other days  . [DISCONTINUED] warfarin (COUMADIN) 1 MG tablet Take 1 mg by mouth See admin instructions. Take 6 mg on Sunday, Tuesday, and  Thursday . Take 5 mg on all other days     Allergies:   Iodinated diagnostic agents and Ioxaglate   Social History   Socioeconomic History  . Marital status: Single    Spouse name: Not on file  . Number of children: Not on file  . Years of education: Not on file  . Highest education level: Not on file  Occupational History  . Not on file  Tobacco Use  . Smoking status: Former Games developer  . Smokeless tobacco: Never Used  Vaping Use  . Vaping Use: Never used  Substance and Sexual Activity  . Alcohol use: No  . Drug use: No  . Sexual activity: Not on file  Other Topics Concern  . Not on file  Social History Narrative  . Not on file   Social Determinants of  Health   Financial Resource Strain: Not on file  Food Insecurity: Not on file  Transportation Needs: Not on file  Physical Activity: Not on file  Stress: Not on file  Social Connections: Not on file     Family History: The patient's family history includes Heart disease in her brother. ROS:   Please see the history of present illness.    All 14 point review of systems negative except as described per history of present illness  EKGs/Labs/Other Studies Reviewed:      Recent Labs: 12/01/2019: NT-Pro BNP 209 06/03/2020: BUN 17; Creatinine, Ser 0.92; Potassium 4.1; Sodium 139  Recent Lipid Panel    Component Value Date/Time   CHOL 177 03/03/2020 1417   TRIG 149 03/03/2020 1417   HDL 46 03/03/2020 1417   CHOLHDL 3.8 03/03/2020 1417   CHOLHDL 4.3 07/04/2007 0815   VLDL 28 07/04/2007 0815   LDLCALC 105 (H) 03/03/2020 1417    Physical Exam:    VS:  BP (!) 154/78 (BP Location: Right Arm, Patient Position: Sitting, Cuff Size: Large)   Pulse 76   Ht 6' (1.829 m)   Wt (!) 348 lb (157.9 kg)   SpO2 98%   BMI 47.20 kg/m     Wt Readings from Last 3 Encounters:  11/16/20 (!) 348 lb (157.9 kg)  06/03/20 (!) 343 lb (155.6 kg)  03/03/20 (!) 346 lb (156.9 kg)     GEN:  Well nourished, well developed in no acute distress HEENT: Normal NECK: No JVD; No carotid bruits LYMPHATICS: No lymphadenopathy CARDIAC: RRR, crisp mechanical valve sounds, no rubs, no gallops RESPIRATORY:  Clear to auscultation without rales, wheezing or rhonchi  ABDOMEN: Soft, non-tender, non-distended MUSCULOSKELETAL:  No edema; No deformity  SKIN: Warm and dry LOWER EXTREMITIES: no swelling NEUROLOGIC:  Alert and oriented x 3 PSYCHIATRIC:  Normal affect   ASSESSMENT:    1. Paroxysmal atrial fibrillation (HCC)   2. PVD (peripheral vascular disease) (HCC)   3. Dilated cardiomyopathy (HCC)   4. Coronary artery disease involving native coronary artery of native heart without angina pectoris   5. Presence  of automatic implantable cardioverter-defibrillator    PLAN:    In order of problems listed above:  1. Paroxysmal atrial fibrillation seems to maintaining sinus rhythm anticoagulant which I will continue. 2. Peripheral vascular disease stable and actually  advanced but no potential targets for intervention. 3. Dilated cardiomyopathy with latest estimation of left ventricle ejection fraction 40 to 45% from November 2021.  We will continue present aggressive management. 4. Coronary disease stable no new recent issues. 5. ICD present, Medtronic device, normal function, no therapies delivered.   Medication Adjustments/Labs and Tests Ordered: Current medicines are reviewed at length with the patient today.  Concerns regarding medicines are outlined above.  No orders of the defined types were placed in this encounter.  Medication changes: No orders of the defined types were placed in this encounter.   Signed, Georgeanna Lea, MD, [facility] 11/16/2020 3:38 PM    [facility]

## 2020-11-17 ENCOUNTER — Telehealth: Payer: Self-pay | Admitting: *Deleted

## 2020-11-17 DIAGNOSIS — R791 Abnormal coagulation profile: Secondary | ICD-10-CM | POA: Diagnosis not present

## 2020-11-17 DIAGNOSIS — E785 Hyperlipidemia, unspecified: Secondary | ICD-10-CM

## 2020-11-17 LAB — LIPID PANEL
Chol/HDL Ratio: 3.9 ratio (ref 0.0–4.4)
Cholesterol, Total: 201 mg/dL — ABNORMAL HIGH (ref 100–199)
HDL: 51 mg/dL (ref >39)
LDL Chol Calc (NIH): 131 mg/dL — ABNORMAL HIGH (ref 0–99)
Triglycerides: 104 mg/dL (ref 0–149)
VLDL Cholesterol Cal: 19 mg/dL (ref 5–40)

## 2020-11-17 NOTE — Telephone Encounter (Signed)
-----   Message from Georgeanna Lea, MD sent at 11/17/2020  9:25 AM EST ----- Cholesterol still not well controlled.  Please double the dose of Crestor from 10 mg to 20 mg daily, fasting lipid profile, AST ALT within next 6 weeks

## 2020-11-17 NOTE — Telephone Encounter (Signed)
Pt will increase her crestor to 20 mg daily (will just take 2 of the 10 mg) and will come back in 6 weeks 12/29/20 for fasting cholesterol, ALT and AST per Dr. Bing Matter

## 2020-11-23 DIAGNOSIS — Z0000 Encounter for general adult medical examination without abnormal findings: Secondary | ICD-10-CM | POA: Diagnosis not present

## 2020-11-23 DIAGNOSIS — E785 Hyperlipidemia, unspecified: Secondary | ICD-10-CM | POA: Diagnosis not present

## 2020-11-23 DIAGNOSIS — Z9181 History of falling: Secondary | ICD-10-CM | POA: Diagnosis not present

## 2020-11-23 LAB — HM AWV

## 2020-11-29 ENCOUNTER — Ambulatory Visit (INDEPENDENT_AMBULATORY_CARE_PROVIDER_SITE_OTHER): Payer: Medicare Other

## 2020-11-29 DIAGNOSIS — I4901 Ventricular fibrillation: Secondary | ICD-10-CM

## 2020-11-29 LAB — CUP PACEART REMOTE DEVICE CHECK
Battery Remaining Longevity: 82 mo
Battery Voltage: 2.99 V
Brady Statistic AP VP Percent: 0 %
Brady Statistic AP VS Percent: 3.78 %
Brady Statistic AS VP Percent: 0.04 %
Brady Statistic AS VS Percent: 96.17 %
Brady Statistic RA Percent Paced: 3.79 %
Brady Statistic RV Percent Paced: 0.04 %
Date Time Interrogation Session: 20220206222840
HighPow Impedance: 78 Ohm
Implantable Lead Implant Date: 20180102
Implantable Lead Implant Date: 20180102
Implantable Lead Location: 753859
Implantable Lead Location: 753860
Implantable Lead Model: 5076
Implantable Pulse Generator Implant Date: 20180102
Lead Channel Impedance Value: 342 Ohm
Lead Channel Impedance Value: 437 Ohm
Lead Channel Impedance Value: 494 Ohm
Lead Channel Pacing Threshold Amplitude: 0.375 V
Lead Channel Pacing Threshold Amplitude: 1 V
Lead Channel Pacing Threshold Pulse Width: 0.4 ms
Lead Channel Pacing Threshold Pulse Width: 0.4 ms
Lead Channel Sensing Intrinsic Amplitude: 2.625 mV
Lead Channel Sensing Intrinsic Amplitude: 2.625 mV
Lead Channel Sensing Intrinsic Amplitude: 22.875 mV
Lead Channel Sensing Intrinsic Amplitude: 22.875 mV
Lead Channel Setting Pacing Amplitude: 1.5 V
Lead Channel Setting Pacing Amplitude: 2.25 V
Lead Channel Setting Pacing Pulse Width: 0.4 ms
Lead Channel Setting Sensing Sensitivity: 0.3 mV

## 2020-12-01 DIAGNOSIS — R791 Abnormal coagulation profile: Secondary | ICD-10-CM | POA: Diagnosis not present

## 2020-12-05 DIAGNOSIS — R791 Abnormal coagulation profile: Secondary | ICD-10-CM | POA: Diagnosis not present

## 2020-12-05 NOTE — Progress Notes (Signed)
Remote ICD transmission.   

## 2020-12-12 DIAGNOSIS — R791 Abnormal coagulation profile: Secondary | ICD-10-CM | POA: Diagnosis not present

## 2020-12-19 DIAGNOSIS — S301XXA Contusion of abdominal wall, initial encounter: Secondary | ICD-10-CM | POA: Diagnosis not present

## 2020-12-19 DIAGNOSIS — S8011XA Contusion of right lower leg, initial encounter: Secondary | ICD-10-CM | POA: Diagnosis not present

## 2020-12-19 DIAGNOSIS — R791 Abnormal coagulation profile: Secondary | ICD-10-CM | POA: Diagnosis not present

## 2020-12-19 DIAGNOSIS — S40021A Contusion of right upper arm, initial encounter: Secondary | ICD-10-CM | POA: Diagnosis not present

## 2020-12-20 ENCOUNTER — Ambulatory Visit: Payer: Medicare Other | Admitting: Sports Medicine

## 2020-12-23 ENCOUNTER — Ambulatory Visit: Payer: Medicare Other | Admitting: Sports Medicine

## 2020-12-27 DIAGNOSIS — J019 Acute sinusitis, unspecified: Secondary | ICD-10-CM | POA: Diagnosis not present

## 2020-12-27 DIAGNOSIS — J208 Acute bronchitis due to other specified organisms: Secondary | ICD-10-CM | POA: Diagnosis not present

## 2020-12-27 DIAGNOSIS — R791 Abnormal coagulation profile: Secondary | ICD-10-CM | POA: Diagnosis not present

## 2020-12-27 DIAGNOSIS — B9689 Other specified bacterial agents as the cause of diseases classified elsewhere: Secondary | ICD-10-CM | POA: Diagnosis not present

## 2020-12-29 DIAGNOSIS — E785 Hyperlipidemia, unspecified: Secondary | ICD-10-CM | POA: Diagnosis not present

## 2020-12-29 LAB — ALT: ALT: 18 IU/L (ref 0–32)

## 2020-12-29 LAB — LIPID PANEL
Chol/HDL Ratio: 3.5 ratio (ref 0.0–4.4)
Cholesterol, Total: 175 mg/dL (ref 100–199)
HDL: 50 mg/dL (ref >39)
LDL Chol Calc (NIH): 104 mg/dL — ABNORMAL HIGH (ref 0–99)
Triglycerides: 120 mg/dL (ref 0–149)
VLDL Cholesterol Cal: 21 mg/dL (ref 5–40)

## 2020-12-29 LAB — AST: AST: 15 IU/L (ref 0–40)

## 2020-12-30 ENCOUNTER — Telehealth: Payer: Self-pay

## 2020-12-30 DIAGNOSIS — I70213 Atherosclerosis of native arteries of extremities with intermittent claudication, bilateral legs: Secondary | ICD-10-CM

## 2020-12-30 DIAGNOSIS — I5023 Acute on chronic systolic (congestive) heart failure: Secondary | ICD-10-CM

## 2020-12-30 DIAGNOSIS — E785 Hyperlipidemia, unspecified: Secondary | ICD-10-CM

## 2020-12-30 NOTE — Telephone Encounter (Signed)
Patient notified of results. Aware to stop by  [facility] office for labs and order completed.

## 2020-12-30 NOTE — Telephone Encounter (Signed)
-----   Message from Georgeanna Lea, MD sent at 12/30/2020  1:19 PM EST ----- Cholesterol still elevated.  However, it is much better than before.  Please try to work on to be more on the diet and exercise and then we will recheck it in next 3 months

## 2021-01-03 DIAGNOSIS — R791 Abnormal coagulation profile: Secondary | ICD-10-CM | POA: Diagnosis not present

## 2021-01-06 DIAGNOSIS — R791 Abnormal coagulation profile: Secondary | ICD-10-CM | POA: Diagnosis not present

## 2021-01-11 ENCOUNTER — Other Ambulatory Visit: Payer: Self-pay | Admitting: Cardiology

## 2021-01-13 DIAGNOSIS — R791 Abnormal coagulation profile: Secondary | ICD-10-CM | POA: Diagnosis not present

## 2021-01-17 DIAGNOSIS — E1165 Type 2 diabetes mellitus with hyperglycemia: Secondary | ICD-10-CM | POA: Diagnosis not present

## 2021-01-17 DIAGNOSIS — I4891 Unspecified atrial fibrillation: Secondary | ICD-10-CM | POA: Diagnosis not present

## 2021-01-17 DIAGNOSIS — I420 Dilated cardiomyopathy: Secondary | ICD-10-CM | POA: Diagnosis not present

## 2021-01-17 DIAGNOSIS — Z79899 Other long term (current) drug therapy: Secondary | ICD-10-CM | POA: Diagnosis not present

## 2021-01-17 DIAGNOSIS — E785 Hyperlipidemia, unspecified: Secondary | ICD-10-CM | POA: Diagnosis not present

## 2021-01-17 DIAGNOSIS — E039 Hypothyroidism, unspecified: Secondary | ICD-10-CM | POA: Diagnosis not present

## 2021-01-17 DIAGNOSIS — G4733 Obstructive sleep apnea (adult) (pediatric): Secondary | ICD-10-CM | POA: Diagnosis not present

## 2021-01-17 DIAGNOSIS — E1140 Type 2 diabetes mellitus with diabetic neuropathy, unspecified: Secondary | ICD-10-CM | POA: Diagnosis not present

## 2021-01-17 DIAGNOSIS — I110 Hypertensive heart disease with heart failure: Secondary | ICD-10-CM | POA: Diagnosis not present

## 2021-01-17 DIAGNOSIS — J449 Chronic obstructive pulmonary disease, unspecified: Secondary | ICD-10-CM | POA: Diagnosis not present

## 2021-01-17 DIAGNOSIS — I739 Peripheral vascular disease, unspecified: Secondary | ICD-10-CM | POA: Diagnosis not present

## 2021-01-18 ENCOUNTER — Other Ambulatory Visit: Payer: Self-pay

## 2021-01-18 ENCOUNTER — Ambulatory Visit (INDEPENDENT_AMBULATORY_CARE_PROVIDER_SITE_OTHER): Payer: Medicare Other | Admitting: Sports Medicine

## 2021-01-18 ENCOUNTER — Encounter: Payer: Self-pay | Admitting: Sports Medicine

## 2021-01-18 DIAGNOSIS — IMO0002 Reserved for concepts with insufficient information to code with codable children: Secondary | ICD-10-CM

## 2021-01-18 DIAGNOSIS — E1140 Type 2 diabetes mellitus with diabetic neuropathy, unspecified: Secondary | ICD-10-CM | POA: Diagnosis not present

## 2021-01-18 DIAGNOSIS — M79674 Pain in right toe(s): Secondary | ICD-10-CM

## 2021-01-18 DIAGNOSIS — M79675 Pain in left toe(s): Secondary | ICD-10-CM

## 2021-01-18 DIAGNOSIS — E1165 Type 2 diabetes mellitus with hyperglycemia: Secondary | ICD-10-CM

## 2021-01-18 DIAGNOSIS — B351 Tinea unguium: Secondary | ICD-10-CM

## 2021-01-18 NOTE — Progress Notes (Signed)
Subjective: Shannon Baxter is a 60 y.o. female patient with history of diabetes who presents to office today complaining of long, painful nails while ambulating in shoes; unable to trim. Patient states that the glucose reading this morning was 102 and last A1c 6.5 and saw PCP Dr. Tomasa Blase 1 day ago.  No other pedal complaints noted.  Patient Active Problem List   Diagnosis Date Noted  . CHF (congestive heart failure) (HCC)   . Stroke (HCC)   . Sleep apnea   . PVD (peripheral vascular disease) (HCC)   . Nutritional and metabolic cardiomyopathy (HCC)   . Hypertension   . Hyperlipidemia   . Heart murmur   . GERD (gastroesophageal reflux disease)   . Diabetes mellitus without complication (HCC)   . Claudication (HCC)   . Congestive heart failure (CHF) (HCC)   . Asthma   . Arthritis   . Aortic stenosis   . Aortic insufficiency   . Allergy   . Coronary artery disease   . Paroxysmal atrial flutter (HCC) 09/01/2018  . Chronic systolic congestive heart failure (HCC) 12/06/2016  . Morbid obesity (HCC) 12/06/2016  . Presence of automatic implantable cardioverter-defibrillator 10/23/2016  . Dilated cardiomyopathy (HCC) 09/03/2016  . History of mechanical aortic valve replacement 03/29/2016  . Paroxysmal atrial fibrillation (HCC) 03/06/2016  . Rheumatic aortic valve insufficiency 03/05/2016  . Acute on chronic systolic heart failure (HCC) 02/21/2016  . Hypothyroidism 01/23/2016  . Type 2 diabetes mellitus with diabetic peripheral angiopathy without gangrene, without long-term current use of insulin (HCC) 11/22/2015  . Dyspnea on exertion 10/26/2015  . Atherosclerosis of native arteries of extremities with intermittent claudication, bilateral legs (HCC) 08/04/2015  . Carotid atherosclerosis 07/26/2015  . Coronary artery disease involving native coronary artery of native heart without angina pectoris 07/26/2015  . Dyslipidemia 07/26/2015  . Essential hypertension 07/26/2015  . Intermittent  claudication (HCC) 07/26/2015  . Peripheral vascular disease (HCC) 07/26/2015  . History of tobacco abuse 07/26/2015   Current Outpatient Medications on File Prior to Visit  Medication Sig Dispense Refill  . albuterol (PROVENTIL HFA;VENTOLIN HFA) 108 (90 Base) MCG/ACT inhaler Inhale 1-2 puffs into the lungs every 6 (six) hours as needed for wheezing or shortness of breath.    . ALPRAZolam (XANAX) 0.5 MG tablet Take 0.5 mg by mouth 3 (three) times daily as needed for anxiety.   0  . amiodarone (PACERONE) 200 MG tablet Take 200 mg by mouth daily after breakfast.   0  . atorvastatin (LIPITOR) 80 MG tablet Take 80 mg by mouth daily.    . carvedilol (COREG) 25 MG tablet TAKE 1 TABLET BY MOUTH TWICE A DAY 180 tablet 3  . cefdinir (OMNICEF) 300 MG capsule Take 300 mg by mouth 2 (two) times daily.    . clopidogrel (PLAVIX) 75 MG tablet Take 75 mg by mouth daily after breakfast.   6  . COMBIVENT RESPIMAT 20-100 MCG/ACT AERS respimat SMARTSIG:1 Puff(s) Via Inhaler 4 Times Daily PRN    . Cyanocobalamin (B-12) 500 MCG TABS Take 3 tablets by mouth daily.    Marland Kitchen EPIPEN 2-PAK 0.3 MG/0.3ML SOAJ injection Inject 0.3 mg into the muscle once.   0  . ezetimibe (ZETIA) 10 MG tablet Take 10 mg by mouth daily.    . fluticasone (FLONASE) 50 MCG/ACT nasal spray Place 2 sprays into both nostrils daily as needed for allergies.     . fluticasone furoate-vilanterol (BREO ELLIPTA) 200-25 MCG/INH AEPB Inhale 1 puff into the lungs daily.    Marland Kitchen  furosemide (LASIX) 20 MG tablet TAKE 3 TABLETS (60 MG TOTAL) BY MOUTH 2 (TWO) TIMES DAILY. 540 tablet 2  . gabapentin (NEURONTIN) 300 MG capsule Take 300 mg by mouth 3 (three) times daily.   5  . glimepiride (AMARYL) 4 MG tablet Take 4 mg by mouth daily with breakfast.     . HYDROcodone-acetaminophen (NORCO) 10-325 MG tablet Take 1 tablet by mouth 4 (four) times daily as needed.    Marland Kitchen ipratropium-albuterol (DUONEB) 0.5-2.5 (3) MG/3ML SOLN Take 3 mLs by nebulization 2 (two) times daily.      Marland Kitchen levothyroxine (SYNTHROID) 88 MCG tablet Take 88 mcg by mouth daily.    Marland Kitchen linaclotide (LINZESS) 72 MCG capsule Take 72 mcg by mouth daily as needed (constipation).    Marland Kitchen loratadine (CLARITIN) 10 MG tablet Take 10 mg by mouth at bedtime.     . metFORMIN (GLUCOPHAGE) 500 MG tablet Take 500 mg by mouth 2 (two) times daily with a meal.     . montelukast (SINGULAIR) 10 MG tablet Take 10 mg by mouth at bedtime.     . Multiple Vitamins-Minerals (CENTRUM SILVER ULTRA WOMENS) TABS Take 1 tablet by mouth daily.    Marland Kitchen neomycin-polymyxin-hydrocortisone (CORTISPORIN) OTIC solution Apply twice daily to left 1st toenail 10 mL 0  . nystatin (MYCOSTATIN/NYSTOP) powder Apply 1 application topically 3 (three) times daily.    Marland Kitchen omeprazole (PRILOSEC) 20 MG capsule Take 20 mg by mouth at bedtime.     . ondansetron (ZOFRAN-ODT) 4 MG disintegrating tablet Take 4 mg by mouth every 6 (six) hours.    . Potassium Chloride ER 20 MEQ TBCR TAKE 1 TABLET BY MOUTH EVERY DAY 90 tablet 2  . promethazine (PHENERGAN) 25 MG suppository Place 25 mg rectally every 6 (six) hours as needed.    . Promethazine-Codeine 6.25-10 MG/5ML SOLN Take 5 mLs by mouth every 4 (four) hours as needed.    . Red Yeast Rice 600 MG TABS Take 2 tablets by mouth daily.    . rosuvastatin (CRESTOR) 10 MG tablet Take 20 mg by mouth daily. Take 2 tablets 20 mg daily    . sacubitril-valsartan (ENTRESTO) 49-51 MG Take 1 tablet by mouth 2 (two) times daily. 180 tablet 2  . warfarin (COUMADIN) 4 MG tablet Take 4 mg by mouth. Take 1 tablet daily except on WED and SUN when you take 5 mgs    . warfarin (COUMADIN) 5 MG tablet Take 5 mg by mouth See admin instructions. Take 6 mg on Sunday, Tuesday, and  Thursday . Take 5 mg on all other days     No current facility-administered medications on file prior to visit.   Allergies  Allergen Reactions  . Iodinated Diagnostic Agents Hives and Rash  . Ioxaglate Hives    Recent Results (from the past 2160 hour(s))  Lipid  panel     Status: Abnormal   Collection Time: 11/16/20  3:50 PM  Result Value Ref Range   Cholesterol, Total 201 (H) 100 - 199 mg/dL   Triglycerides 542 0 - 149 mg/dL   HDL 51 >70 mg/dL   VLDL Cholesterol Cal 19 5 - 40 mg/dL   LDL Chol Calc (NIH) 623 (H) 0 - 99 mg/dL   Chol/HDL Ratio 3.9 0.0 - 4.4 ratio    Comment:                                   T.  Chol/HDL Ratio                                             Men  Women                               1/2 Avg.Risk  3.4    3.3                                   Avg.Risk  5.0    4.4                                2X Avg.Risk  9.6    7.1                                3X Avg.Risk 23.4   11.0   CUP PACEART REMOTE DEVICE CHECK     Status: None   Collection Time: 11/27/20 10:28 PM  Result Value Ref Range   Date Time Interrogation Session [id number]    Pulse Generator Manufacturer MERM    Pulse Gen Model [id number] Evera MRI XT DR    Pulse Gen Serial Number [id number]    Clinic Name [facility]    Implantable Pulse Generator Type Implantable Cardiac Defibulator    Implantable Pulse Generator Implant Date [id number]    Implantable Lead Manufacturer MERM    Implantable Lead Model 5076 CapSureFix Novus MRI SureScan    Implantable Lead Serial Number [id number]    Implantable Lead Implant Date 41638453    Implantable Lead Location Detail 1 UNKNOWN    Implantable Lead Location [id number]    Implantable Lead Manufacturer [facility]    Implantable Lead Model [id number] Sprint Quattro Secure S MRI SureScan    Implantable Lead Serial Number [id number]    Implantable Lead Implant Date 32122482    Implantable Lead Location Detail 1 UNKNOWN    Implantable Lead Location [id number]    Lead Channel Setting Sensing Sensitivity 0.3 mV   Lead Channel Setting Pacing Amplitude 1.5 V   Lead Channel Setting Pacing Pulse Width 0.4 ms   Lead Channel Setting Pacing Amplitude 2.25 V   Lead Channel Impedance Value 494 ohm   Lead Channel Sensing Intrinsic Amplitude 2.625 mV    Lead Channel Sensing Intrinsic Amplitude 2.625 mV   Lead Channel Pacing Threshold Amplitude 0.375 V   Lead Channel Pacing Threshold Pulse Width 0.4 ms   Lead Channel Impedance Value 437 ohm   Lead Channel Impedance Value 342 ohm   Lead Channel Sensing Intrinsic Amplitude 22.875 mV   Lead Channel Sensing Intrinsic Amplitude 22.875 mV   Lead Channel Pacing Threshold Amplitude 1 V   Lead Channel Pacing Threshold Pulse Width 0.4 ms   HighPow Impedance 78 ohm   Battery Status OK    Battery Remaining Longevity 82 mo   Battery Voltage 2.99 V   Brady Statistic RA Percent Paced 3.79 %   Brady Statistic RV Percent Paced 0.04 %   Brady Statistic AP VP Percent 0 %   Brady Statistic AS VP Percent 0.04 %   Brady Statistic AP VS Percent 3.78 %   Huston Foley  Statistic AS VS Percent 96.17 %  Lipid panel     Status: Abnormal   Collection Time: 12/29/20 11:00 AM  Result Value Ref Range   Cholesterol, Total 175 100 - 199 mg/dL   Triglycerides 161 0 - 149 mg/dL   HDL 50 >09 mg/dL   VLDL Cholesterol Cal 21 5 - 40 mg/dL   LDL Chol Calc (NIH) 604 (H) 0 - 99 mg/dL   Chol/HDL Ratio 3.5 0.0 - 4.4 ratio    Comment:                                   T. Chol/HDL Ratio                                             Men  Women                               1/2 Avg.Risk  3.4    3.3                                   Avg.Risk  5.0    4.4                                2X Avg.Risk  9.6    7.1                                3X Avg.Risk 23.4   11.0   AST     Status: None   Collection Time: 12/29/20 11:00 AM  Result Value Ref Range   AST 15 0 - 40 IU/L  ALT     Status: None   Collection Time: 12/29/20 11:00 AM  Result Value Ref Range   ALT 18 0 - 32 IU/L    Objective: General: Patient is awake, alert, and oriented x 3 and in no acute distress.  Integument: Skin is warm, dry and supple bilateral. Nails are tender, long, thickened and dystrophic with subungual debris, consistent with onychomycosis, 1-5 bilateral  minimal ingrowing on the right great toe.  No signs of infection. No open lesions or preulcerative lesions present bilateral.  Remaining integument unremarkable.  Neurovascular status unchanged.   Musculoskeletal:Asymptomatic pes planus pedal deformities noted bilateral. Muscular strength 5/5 in all lower extremity muscular groups bilateral. No tenderness with calf compression bilateral.  Assessment and Plan: Problem List Items Addressed This Visit   None   Visit Diagnoses    Pain due to onychomycosis of toenails of both feet    -  Primary   Type 2 diabetes, uncontrolled, with neuropathy (HCC)       Relevant Medications   atorvastatin (LIPITOR) 80 MG tablet     -Examined patient. -Re-Discussed and educated patient on diabetic foot care -Mechanically debrided all nails 1-5 bilateral using sterile nail nipper and filed with dremel without incident  -Patient to return  in 10 to 12 weeks for at risk foot care like previous -Patient advised to call the office if any problems or questions arise in the meantime.  Nasiir Monts  Cannon Kettle, DPM

## 2021-01-27 DIAGNOSIS — J4530 Mild persistent asthma, uncomplicated: Secondary | ICD-10-CM | POA: Diagnosis not present

## 2021-01-27 DIAGNOSIS — G4733 Obstructive sleep apnea (adult) (pediatric): Secondary | ICD-10-CM | POA: Diagnosis not present

## 2021-01-27 DIAGNOSIS — J301 Allergic rhinitis due to pollen: Secondary | ICD-10-CM | POA: Diagnosis not present

## 2021-01-27 DIAGNOSIS — R791 Abnormal coagulation profile: Secondary | ICD-10-CM | POA: Diagnosis not present

## 2021-02-01 ENCOUNTER — Other Ambulatory Visit: Payer: Self-pay | Admitting: Cardiology

## 2021-02-10 DIAGNOSIS — R791 Abnormal coagulation profile: Secondary | ICD-10-CM | POA: Diagnosis not present

## 2021-02-23 DIAGNOSIS — I5020 Unspecified systolic (congestive) heart failure: Secondary | ICD-10-CM | POA: Diagnosis not present

## 2021-02-23 DIAGNOSIS — E039 Hypothyroidism, unspecified: Secondary | ICD-10-CM | POA: Diagnosis not present

## 2021-02-23 DIAGNOSIS — R791 Abnormal coagulation profile: Secondary | ICD-10-CM | POA: Diagnosis not present

## 2021-02-28 ENCOUNTER — Ambulatory Visit (INDEPENDENT_AMBULATORY_CARE_PROVIDER_SITE_OTHER): Payer: Medicare Other

## 2021-02-28 DIAGNOSIS — I420 Dilated cardiomyopathy: Secondary | ICD-10-CM | POA: Diagnosis not present

## 2021-02-28 LAB — CUP PACEART REMOTE DEVICE CHECK
Battery Remaining Longevity: 75 mo
Battery Voltage: 2.99 V
Brady Statistic AP VP Percent: 0 %
Brady Statistic AP VS Percent: 2.31 %
Brady Statistic AS VP Percent: 0.04 %
Brady Statistic AS VS Percent: 97.65 %
Brady Statistic RA Percent Paced: 2.31 %
Brady Statistic RV Percent Paced: 0.04 %
Date Time Interrogation Session: 20220510012506
HighPow Impedance: 78 Ohm
Implantable Lead Implant Date: 20180102
Implantable Lead Implant Date: 20180102
Implantable Lead Location: 753859
Implantable Lead Location: 753860
Implantable Lead Model: 5076
Implantable Pulse Generator Implant Date: 20180102
Lead Channel Impedance Value: 342 Ohm
Lead Channel Impedance Value: 399 Ohm
Lead Channel Impedance Value: 456 Ohm
Lead Channel Pacing Threshold Amplitude: 0.375 V
Lead Channel Pacing Threshold Amplitude: 1 V
Lead Channel Pacing Threshold Pulse Width: 0.4 ms
Lead Channel Pacing Threshold Pulse Width: 0.4 ms
Lead Channel Sensing Intrinsic Amplitude: 22.25 mV
Lead Channel Sensing Intrinsic Amplitude: 22.25 mV
Lead Channel Sensing Intrinsic Amplitude: 4.125 mV
Lead Channel Sensing Intrinsic Amplitude: 4.125 mV
Lead Channel Setting Pacing Amplitude: 1.5 V
Lead Channel Setting Pacing Amplitude: 2.25 V
Lead Channel Setting Pacing Pulse Width: 0.4 ms
Lead Channel Setting Sensing Sensitivity: 0.3 mV

## 2021-03-01 DIAGNOSIS — G44209 Tension-type headache, unspecified, not intractable: Secondary | ICD-10-CM | POA: Diagnosis not present

## 2021-03-10 DIAGNOSIS — R791 Abnormal coagulation profile: Secondary | ICD-10-CM | POA: Diagnosis not present

## 2021-03-17 DIAGNOSIS — R791 Abnormal coagulation profile: Secondary | ICD-10-CM | POA: Diagnosis not present

## 2021-03-22 NOTE — Progress Notes (Signed)
Remote ICD transmission.   

## 2021-03-24 DIAGNOSIS — R791 Abnormal coagulation profile: Secondary | ICD-10-CM | POA: Diagnosis not present

## 2021-03-31 ENCOUNTER — Other Ambulatory Visit: Payer: Self-pay

## 2021-03-31 DIAGNOSIS — R791 Abnormal coagulation profile: Secondary | ICD-10-CM | POA: Diagnosis not present

## 2021-03-31 DIAGNOSIS — I70213 Atherosclerosis of native arteries of extremities with intermittent claudication, bilateral legs: Secondary | ICD-10-CM | POA: Diagnosis not present

## 2021-03-31 DIAGNOSIS — I5023 Acute on chronic systolic (congestive) heart failure: Secondary | ICD-10-CM | POA: Diagnosis not present

## 2021-03-31 DIAGNOSIS — E785 Hyperlipidemia, unspecified: Secondary | ICD-10-CM | POA: Diagnosis not present

## 2021-03-31 MED ORDER — ROSUVASTATIN CALCIUM 20 MG PO TABS: 20.0000 mg | ORAL_TABLET | Freq: Every day | ORAL | 1 refills | Status: DC

## 2021-03-31 NOTE — Telephone Encounter (Signed)
Refill of Rosuvastatin 20 mg sent to [facility] on [address].

## 2021-04-01 LAB — LIPID PANEL
Chol/HDL Ratio: 3.3 ratio (ref 0.0–4.4)
Cholesterol, Total: 151 mg/dL (ref 100–199)
HDL: 46 mg/dL (ref >39)
LDL Chol Calc (NIH): 85 mg/dL (ref 0–99)
Triglycerides: 110 mg/dL (ref 0–149)
VLDL Cholesterol Cal: 20 mg/dL (ref 5–40)

## 2021-04-05 DIAGNOSIS — J019 Acute sinusitis, unspecified: Secondary | ICD-10-CM | POA: Diagnosis not present

## 2021-04-05 DIAGNOSIS — B9689 Other specified bacterial agents as the cause of diseases classified elsewhere: Secondary | ICD-10-CM | POA: Diagnosis not present

## 2021-04-05 DIAGNOSIS — I5020 Unspecified systolic (congestive) heart failure: Secondary | ICD-10-CM | POA: Diagnosis not present

## 2021-04-05 DIAGNOSIS — J208 Acute bronchitis due to other specified organisms: Secondary | ICD-10-CM | POA: Diagnosis not present

## 2021-04-06 ENCOUNTER — Telehealth: Payer: Self-pay | Admitting: Cardiology

## 2021-04-06 DIAGNOSIS — R791 Abnormal coagulation profile: Secondary | ICD-10-CM | POA: Diagnosis not present

## 2021-04-06 NOTE — Telephone Encounter (Signed)
Patient returning call for lab results. 

## 2021-04-06 NOTE — Telephone Encounter (Signed)
Patient informed of results.  

## 2021-04-13 DIAGNOSIS — R791 Abnormal coagulation profile: Secondary | ICD-10-CM | POA: Diagnosis not present

## 2021-04-17 DIAGNOSIS — R791 Abnormal coagulation profile: Secondary | ICD-10-CM | POA: Diagnosis not present

## 2021-04-17 DIAGNOSIS — G4733 Obstructive sleep apnea (adult) (pediatric): Secondary | ICD-10-CM | POA: Diagnosis not present

## 2021-04-21 DIAGNOSIS — M25551 Pain in right hip: Secondary | ICD-10-CM | POA: Diagnosis not present

## 2021-04-21 DIAGNOSIS — K5750 Diverticulosis of both small and large intestine without perforation or abscess without bleeding: Secondary | ICD-10-CM | POA: Diagnosis not present

## 2021-04-21 DIAGNOSIS — N3289 Other specified disorders of bladder: Secondary | ICD-10-CM | POA: Diagnosis not present

## 2021-04-21 DIAGNOSIS — M5137 Other intervertebral disc degeneration, lumbosacral region: Secondary | ICD-10-CM | POA: Diagnosis not present

## 2021-04-21 DIAGNOSIS — M160 Bilateral primary osteoarthritis of hip: Secondary | ICD-10-CM | POA: Diagnosis not present

## 2021-04-21 DIAGNOSIS — Z9181 History of falling: Secondary | ICD-10-CM | POA: Diagnosis not present

## 2021-04-21 DIAGNOSIS — M25552 Pain in left hip: Secondary | ICD-10-CM | POA: Diagnosis not present

## 2021-04-25 DIAGNOSIS — R791 Abnormal coagulation profile: Secondary | ICD-10-CM | POA: Diagnosis not present

## 2021-05-03 DIAGNOSIS — M25552 Pain in left hip: Secondary | ICD-10-CM | POA: Diagnosis not present

## 2021-05-03 DIAGNOSIS — M62551 Muscle wasting and atrophy, not elsewhere classified, right thigh: Secondary | ICD-10-CM | POA: Diagnosis not present

## 2021-05-04 DIAGNOSIS — G4733 Obstructive sleep apnea (adult) (pediatric): Secondary | ICD-10-CM | POA: Diagnosis not present

## 2021-05-04 DIAGNOSIS — Z87891 Personal history of nicotine dependence: Secondary | ICD-10-CM | POA: Diagnosis not present

## 2021-05-04 DIAGNOSIS — J4540 Moderate persistent asthma, uncomplicated: Secondary | ICD-10-CM | POA: Diagnosis not present

## 2021-05-04 DIAGNOSIS — J301 Allergic rhinitis due to pollen: Secondary | ICD-10-CM | POA: Diagnosis not present

## 2021-05-04 DIAGNOSIS — J019 Acute sinusitis, unspecified: Secondary | ICD-10-CM | POA: Diagnosis not present

## 2021-05-10 DIAGNOSIS — R791 Abnormal coagulation profile: Secondary | ICD-10-CM | POA: Diagnosis not present

## 2021-05-15 DIAGNOSIS — Z122 Encounter for screening for malignant neoplasm of respiratory organs: Secondary | ICD-10-CM | POA: Diagnosis not present

## 2021-05-15 DIAGNOSIS — Z87891 Personal history of nicotine dependence: Secondary | ICD-10-CM | POA: Diagnosis not present

## 2021-05-25 DIAGNOSIS — Z7901 Long term (current) use of anticoagulants: Secondary | ICD-10-CM | POA: Diagnosis not present

## 2021-05-29 DIAGNOSIS — J45909 Unspecified asthma, uncomplicated: Secondary | ICD-10-CM | POA: Diagnosis not present

## 2021-05-29 DIAGNOSIS — K649 Unspecified hemorrhoids: Secondary | ICD-10-CM | POA: Diagnosis not present

## 2021-05-29 DIAGNOSIS — Z7901 Long term (current) use of anticoagulants: Secondary | ICD-10-CM | POA: Diagnosis not present

## 2021-05-29 DIAGNOSIS — K5900 Constipation, unspecified: Secondary | ICD-10-CM | POA: Diagnosis not present

## 2021-05-29 DIAGNOSIS — E119 Type 2 diabetes mellitus without complications: Secondary | ICD-10-CM | POA: Diagnosis not present

## 2021-05-29 DIAGNOSIS — I4891 Unspecified atrial fibrillation: Secondary | ICD-10-CM | POA: Diagnosis not present

## 2021-05-29 DIAGNOSIS — Z95810 Presence of automatic (implantable) cardiac defibrillator: Secondary | ICD-10-CM | POA: Diagnosis not present

## 2021-05-29 DIAGNOSIS — K219 Gastro-esophageal reflux disease without esophagitis: Secondary | ICD-10-CM | POA: Diagnosis not present

## 2021-05-30 ENCOUNTER — Ambulatory Visit (INDEPENDENT_AMBULATORY_CARE_PROVIDER_SITE_OTHER): Payer: Medicare Other

## 2021-05-30 DIAGNOSIS — I420 Dilated cardiomyopathy: Secondary | ICD-10-CM | POA: Diagnosis not present

## 2021-05-31 LAB — CUP PACEART REMOTE DEVICE CHECK
Battery Remaining Longevity: 69 mo
Battery Voltage: 2.98 V
Brady Statistic AP VP Percent: 0.01 %
Brady Statistic AP VS Percent: 3.19 %
Brady Statistic AS VP Percent: 0.05 %
Brady Statistic AS VS Percent: 96.76 %
Brady Statistic RA Percent Paced: 3.2 %
Brady Statistic RV Percent Paced: 0.05 %
Date Time Interrogation Session: 20220809213427
HighPow Impedance: 82 Ohm
Implantable Lead Implant Date: 20180102
Implantable Lead Implant Date: 20180102
Implantable Lead Location: 753859
Implantable Lead Location: 753860
Implantable Lead Model: 5076
Implantable Pulse Generator Implant Date: 20180102
Lead Channel Impedance Value: 323 Ohm
Lead Channel Impedance Value: 399 Ohm
Lead Channel Impedance Value: 494 Ohm
Lead Channel Pacing Threshold Amplitude: 0.375 V
Lead Channel Pacing Threshold Amplitude: 1.125 V
Lead Channel Pacing Threshold Pulse Width: 0.4 ms
Lead Channel Pacing Threshold Pulse Width: 0.4 ms
Lead Channel Sensing Intrinsic Amplitude: 25.5 mV
Lead Channel Sensing Intrinsic Amplitude: 25.5 mV
Lead Channel Sensing Intrinsic Amplitude: 4.25 mV
Lead Channel Sensing Intrinsic Amplitude: 4.25 mV
Lead Channel Setting Pacing Amplitude: 1.5 V
Lead Channel Setting Pacing Amplitude: 2.25 V
Lead Channel Setting Pacing Pulse Width: 0.4 ms
Lead Channel Setting Sensing Sensitivity: 0.3 mV

## 2021-06-01 DIAGNOSIS — R791 Abnormal coagulation profile: Secondary | ICD-10-CM | POA: Diagnosis not present

## 2021-06-01 DIAGNOSIS — J301 Allergic rhinitis due to pollen: Secondary | ICD-10-CM | POA: Diagnosis not present

## 2021-06-01 DIAGNOSIS — G4733 Obstructive sleep apnea (adult) (pediatric): Secondary | ICD-10-CM | POA: Diagnosis not present

## 2021-06-01 DIAGNOSIS — J4540 Moderate persistent asthma, uncomplicated: Secondary | ICD-10-CM | POA: Diagnosis not present

## 2021-06-08 ENCOUNTER — Telehealth: Payer: Self-pay

## 2021-06-08 DIAGNOSIS — K8020 Calculus of gallbladder without cholecystitis without obstruction: Secondary | ICD-10-CM | POA: Diagnosis not present

## 2021-06-08 DIAGNOSIS — Z7901 Long term (current) use of anticoagulants: Secondary | ICD-10-CM | POA: Diagnosis not present

## 2021-06-08 NOTE — Telephone Encounter (Signed)
Shannon Baxter from Dr. Camila Li office returning call. She states since she has not gotten a response and they are not in office tomorrow she will have to cancel the patient's procedure if she doesn't hear back by 5:00 pm today.

## 2021-06-08 NOTE — Telephone Encounter (Addendum)
   [facility] Pre-operative Risk Assessment    Request for surgical clearance:  What type of surgery is being performed? Colonoscopy    When is this surgery scheduled? 06/15/2021   What type of clearance is required (medical clearance vs. Pharmacy clearance to hold med vs. Both)? Both   Are there any medications that need to be held prior to surgery and how long?Coumadin and Plavix both to be hold on 06/10/2021   Practice name and name of physician performing surgery? Dr. Christia Reading Misenheimer    What is your office phone number:  [phone]   7.   What is your office fax number: [phone]  8.   Anesthesia type (None, local, MAC, general) ? Versed and Fentanyl per Santiago Glad RN   Shannon Baxter Shannon Baxter 06/08/2021, 10:22 AM  _________________________________________________________________   (provider comments below)

## 2021-06-08 NOTE — Telephone Encounter (Signed)
Follow Up:   Please ask for the receptionist when you call back please.    Shannon Baxter said the doctor will use Conscious Block- Versed and Fentanyl

## 2021-06-09 NOTE — Telephone Encounter (Signed)
Please contact patient and let her know that the requesting office sent a message to cardiology yesterday indicating that they would need to reschedule the patient's appointment.  We will await clearance request for rescheduled procedure.  Thank you,  Thomasene Ripple. Malynn Lucy NP-C    06/09/2021, 10:04 AM [facility] [address] Office [phone] Fax [phone]

## 2021-06-09 NOTE — Telephone Encounter (Signed)
Pt is calling in again to check the staatus of her clearance

## 2021-06-09 NOTE — Telephone Encounter (Signed)
Called pt and scheduled Dr Townsend Roger appt 8-29 @ 320pm. Informed pt that she will need to reschedule her current appointment since we cannot get her in any sooner. Verbalized understanding.  Called [facility] they are not open today left detailed message to call to discuss

## 2021-06-12 NOTE — Telephone Encounter (Signed)
I will fax these notes to surgeon's office; please read notes from 06/09/21. I will forward clearance notes to MD for upcoming appt.

## 2021-06-14 ENCOUNTER — Other Ambulatory Visit: Payer: Self-pay

## 2021-06-14 ENCOUNTER — Encounter: Payer: Self-pay | Admitting: Sports Medicine

## 2021-06-14 ENCOUNTER — Ambulatory Visit (INDEPENDENT_AMBULATORY_CARE_PROVIDER_SITE_OTHER): Payer: Medicare Other | Admitting: Sports Medicine

## 2021-06-14 DIAGNOSIS — M79674 Pain in right toe(s): Secondary | ICD-10-CM

## 2021-06-14 DIAGNOSIS — B351 Tinea unguium: Secondary | ICD-10-CM | POA: Diagnosis not present

## 2021-06-14 DIAGNOSIS — E1165 Type 2 diabetes mellitus with hyperglycemia: Secondary | ICD-10-CM | POA: Diagnosis not present

## 2021-06-14 DIAGNOSIS — E1140 Type 2 diabetes mellitus with diabetic neuropathy, unspecified: Secondary | ICD-10-CM

## 2021-06-14 DIAGNOSIS — M79675 Pain in left toe(s): Secondary | ICD-10-CM

## 2021-06-14 DIAGNOSIS — IMO0002 Reserved for concepts with insufficient information to code with codable children: Secondary | ICD-10-CM

## 2021-06-14 NOTE — Progress Notes (Signed)
Subjective: Shannon Baxter is a 60 y.o. female patient with history of diabetes who presents to office today complaining of long, painful nails while ambulating in shoes; unable to trim. Patient states that the glucose reading this morning was 106 and last A1c 6.1 and saw PCP Dr. Tomasa Blase 1 month ago.  Reports also she gets some soreness in her arches and is interested in diabetic shoes.  No other pedal complaints noted.  Patient Active Problem List   Diagnosis Date Noted   CHF (congestive heart failure) (HCC)    Stroke (HCC)    Sleep apnea    PVD (peripheral vascular disease) (HCC)    Nutritional and metabolic cardiomyopathy (HCC)    Hypertension    Hyperlipidemia    Heart murmur    GERD (gastroesophageal reflux disease)    Diabetes mellitus without complication (HCC)    Claudication (HCC)    Congestive heart failure (CHF) (HCC)    Asthma    Arthritis    Aortic stenosis    Aortic insufficiency    Allergy    Coronary artery disease    Paroxysmal atrial flutter (HCC) 09/01/2018   Chronic systolic congestive heart failure (HCC) 12/06/2016   Morbid obesity (HCC) 12/06/2016   Presence of automatic implantable cardioverter-defibrillator 10/23/2016   Dilated cardiomyopathy (HCC) 09/03/2016   History of mechanical aortic valve replacement 03/29/2016   Paroxysmal atrial fibrillation (HCC) 03/06/2016   Rheumatic aortic valve insufficiency 03/05/2016   Acute on chronic systolic heart failure (HCC) 02/21/2016   Hypothyroidism 01/23/2016   Type 2 diabetes mellitus with diabetic peripheral angiopathy without gangrene, without long-term current use of insulin (HCC) 11/22/2015   Dyspnea on exertion 10/26/2015   Atherosclerosis of native arteries of extremities with intermittent claudication, bilateral legs (HCC) 08/04/2015   Carotid atherosclerosis 07/26/2015   Coronary artery disease involving native coronary artery of native heart without angina pectoris 07/26/2015   Dyslipidemia 07/26/2015    Essential hypertension 07/26/2015   Intermittent claudication (HCC) 07/26/2015   Peripheral vascular disease (HCC) 07/26/2015   History of tobacco abuse 07/26/2015   Current Outpatient Medications on File Prior to Visit  Medication Sig Dispense Refill   albuterol (PROVENTIL HFA;VENTOLIN HFA) 108 (90 Base) MCG/ACT inhaler Inhale 1-2 puffs into the lungs every 6 (six) hours as needed for wheezing or shortness of breath.     ALPRAZolam (XANAX) 0.5 MG tablet Take 0.5 mg by mouth 3 (three) times daily as needed for anxiety.   0   amiodarone (PACERONE) 200 MG tablet Take 200 mg by mouth daily after breakfast.   0   atorvastatin (LIPITOR) 80 MG tablet Take 80 mg by mouth daily.     carvedilol (COREG) 25 MG tablet TAKE 1 TABLET BY MOUTH TWICE A DAY 180 tablet 3   cefdinir (OMNICEF) 300 MG capsule Take 300 mg by mouth 2 (two) times daily.     clopidogrel (PLAVIX) 75 MG tablet Take 75 mg by mouth daily after breakfast.   6   COMBIVENT RESPIMAT 20-100 MCG/ACT AERS respimat SMARTSIG:1 Puff(s) Via Inhaler 4 Times Daily PRN     Cyanocobalamin (B-12) 500 MCG TABS Take 3 tablets by mouth daily.     EPIPEN 2-PAK 0.3 MG/0.3ML SOAJ injection Inject 0.3 mg into the muscle once.   0   ezetimibe (ZETIA) 10 MG tablet Take 10 mg by mouth daily.     fluticasone (FLONASE) 50 MCG/ACT nasal spray Place 2 sprays into both nostrils daily as needed for allergies.      fluticasone furoate-vilanterol (  BREO ELLIPTA) 200-25 MCG/INH AEPB Inhale 1 puff into the lungs daily.     furosemide (LASIX) 20 MG tablet TAKE 3 TABLETS BY MOUTH 2 TIMES DAILY. 540 tablet 2   gabapentin (NEURONTIN) 300 MG capsule Take 300 mg by mouth 3 (three) times daily.   5   glimepiride (AMARYL) 4 MG tablet Take 4 mg by mouth daily with breakfast.      HYDROcodone-acetaminophen (NORCO) 10-325 MG tablet Take 1 tablet by mouth 4 (four) times daily as needed.     ipratropium-albuterol (DUONEB) 0.5-2.5 (3) MG/3ML SOLN Take 3 mLs by nebulization 2 (two) times  daily.      levothyroxine (SYNTHROID) 88 MCG tablet Take 88 mcg by mouth daily.     linaclotide (LINZESS) 72 MCG capsule Take 72 mcg by mouth daily as needed (constipation).     loratadine (CLARITIN) 10 MG tablet Take 10 mg by mouth at bedtime.      metFORMIN (GLUCOPHAGE) 500 MG tablet Take 500 mg by mouth 2 (two) times daily with a meal.      montelukast (SINGULAIR) 10 MG tablet Take 10 mg by mouth at bedtime.      Multiple Vitamins-Minerals (CENTRUM SILVER ULTRA WOMENS) TABS Take 1 tablet by mouth daily.     neomycin-polymyxin-hydrocortisone (CORTISPORIN) OTIC solution Apply twice daily to left 1st toenail 10 mL 0   nystatin (MYCOSTATIN/NYSTOP) powder Apply 1 application topically 3 (three) times daily.     omeprazole (PRILOSEC) 20 MG capsule Take 20 mg by mouth at bedtime.      ondansetron (ZOFRAN-ODT) 4 MG disintegrating tablet Take 4 mg by mouth every 6 (six) hours.     Potassium Chloride ER 20 MEQ TBCR TAKE 1 TABLET BY MOUTH EVERY DAY 90 tablet 2   promethazine (PHENERGAN) 25 MG suppository Place 25 mg rectally every 6 (six) hours as needed.     Promethazine-Codeine 6.25-10 MG/5ML SOLN Take 5 mLs by mouth every 4 (four) hours as needed.     Red Yeast Rice 600 MG TABS Take 2 tablets by mouth daily.     rosuvastatin (CRESTOR) 20 MG tablet Take 1 tablet (20 mg total) by mouth daily. 90 tablet 1   sacubitril-valsartan (ENTRESTO) 49-51 MG Take 1 tablet by mouth 2 (two) times daily. 180 tablet 2   warfarin (COUMADIN) 4 MG tablet Take 4 mg by mouth. Take 1 tablet daily except on WED and SUN when you take 5 mgs     warfarin (COUMADIN) 5 MG tablet Take 5 mg by mouth See admin instructions. Take 6 mg on Sunday, Tuesday, and  Thursday . Take 5 mg on all other days     No current facility-administered medications on file prior to visit.   Allergies  Allergen Reactions   Iodinated Diagnostic Agents Hives and Rash   Ioxaglate Hives    Recent Results (from the past 2160 hour(s))  Lipid Profile      Status: None   Collection Time: 03/31/21 11:26 AM  Result Value Ref Range   Cholesterol, Total 151 100 - 199 mg/dL   Triglycerides 829 0 - 149 mg/dL   HDL 46 >93 mg/dL   VLDL Cholesterol Cal 20 5 - 40 mg/dL   LDL Chol Calc (NIH) 85 0 - 99 mg/dL   Chol/HDL Ratio 3.3 0.0 - 4.4 ratio    Comment:  T. Chol/HDL Ratio                                             Men  Women                               1/2 Avg.Risk  3.4    3.3                                   Avg.Risk  5.0    4.4                                2X Avg.Risk  9.6    7.1                                3X Avg.Risk 23.4   11.0   CUP PACEART REMOTE DEVICE CHECK     Status: None   Collection Time: 05/30/21  9:34 PM  Result Value Ref Range   Date Time Interrogation Session [id number]    Pulse Generator Manufacturer MERM    Pulse Gen Model [id number] Evera MRI XT DR    Pulse Gen Serial Number [id number]    Clinic Name [facility]    Implantable Pulse Generator Type Implantable Cardiac Defibulator    Implantable Pulse Generator Implant Date [id number]    Implantable Lead Manufacturer MERM    Implantable Lead Model 5076 CapSureFix Novus MRI SureScan    Implantable Lead Serial Number [id number]    Implantable Lead Implant Date [id number]    Implantable Lead Location Detail 1 UNKNOWN    Implantable Lead Location [id number]    Implantable Lead Manufacturer [facility]    Implantable Lead Model [id number] Sprint Quattro Secure S MRI SureScan    Implantable Lead Serial Number [id number]    Implantable Lead Implant Date 35009381    Implantable Lead Location Detail 1 UNKNOWN    Implantable Lead Location [id number]    Lead Channel Setting Sensing Sensitivity 0.3 mV   Lead Channel Setting Pacing Amplitude 1.5 V   Lead Channel Setting Pacing Pulse Width 0.4 ms   Lead Channel Setting Pacing Amplitude 2.25 V   Lead Channel Impedance Value 494 ohm   Lead Channel Sensing Intrinsic Amplitude 4.25 mV   Lead Channel Sensing  Intrinsic Amplitude 4.25 mV   Lead Channel Pacing Threshold Amplitude 0.375 V   Lead Channel Pacing Threshold Pulse Width 0.4 ms   Lead Channel Impedance Value 399 ohm   Lead Channel Impedance Value 323 ohm   Lead Channel Sensing Intrinsic Amplitude 25.5 mV   Lead Channel Sensing Intrinsic Amplitude 25.5 mV   Lead Channel Pacing Threshold Amplitude 1.125 V   Lead Channel Pacing Threshold Pulse Width 0.4 ms   HighPow Impedance 82 ohm   Battery Status OK    Battery Remaining Longevity 69 mo   Battery Voltage 2.98 V   Brady Statistic RA Percent Paced 3.2 %   Brady Statistic RV Percent Paced 0.05 %   Brady Statistic AP VP Percent 0.01 %   Brady Statistic AS VP Percent 0.05 %   Brady Statistic AP VS Percent 3.19 %  Brady Statistic AS VS Percent 96.76 %    Objective: General: Patient is awake, alert, and oriented x 3 and in no acute distress.  Integument: Skin is warm, dry and supple bilateral. Nails are tender, long, thickened and dystrophic with subungual debris, consistent with onychomycosis, 1-5 bilateral minimal ingrowing on the right great toe.  No signs of infection. No open lesions or preulcerative lesions present bilateral.  Remaining integument unremarkable.  Neurovascular status unchanged.   Musculoskeletal: Mild arch pain with pes planus pedal deformities noted bilateral. Muscular strength 5/5 in all lower extremity muscular groups bilateral. No tenderness with calf compression bilateral.  Assessment and Plan: Problem List Items Addressed This Visit   None Visit Diagnoses     Pain due to onychomycosis of toenails of both feet    -  Primary   Type 2 diabetes, uncontrolled, with neuropathy (HCC)          -Examined patient. -Re-Discussed and educated patient on diabetic foot care -Mechanically debrided all nails 1-5 bilateral using sterile nail nipper and filed with dremel without incident  -Patient to return  in 10 to 12 weeks for at risk foot care like  previous -Added arch pad to both shoes and advised patient to return to office as scheduled for diabetic shoe measurements -Patient advised to call the office if any problems or questions arise in the meantime.  [name], DPM

## 2021-06-15 ENCOUNTER — Other Ambulatory Visit: Payer: Self-pay

## 2021-06-19 ENCOUNTER — Ambulatory Visit (INDEPENDENT_AMBULATORY_CARE_PROVIDER_SITE_OTHER): Payer: Medicare Other | Admitting: Cardiology

## 2021-06-19 ENCOUNTER — Other Ambulatory Visit: Payer: Self-pay

## 2021-06-19 VITALS — BP 152/84 | HR 78 | Ht 70.0 in | Wt 347.2 lb

## 2021-06-19 DIAGNOSIS — I480 Paroxysmal atrial fibrillation: Secondary | ICD-10-CM | POA: Diagnosis not present

## 2021-06-19 DIAGNOSIS — I420 Dilated cardiomyopathy: Secondary | ICD-10-CM

## 2021-06-19 DIAGNOSIS — Z952 Presence of prosthetic heart valve: Secondary | ICD-10-CM | POA: Diagnosis not present

## 2021-06-19 DIAGNOSIS — E785 Hyperlipidemia, unspecified: Secondary | ICD-10-CM | POA: Diagnosis not present

## 2021-06-19 DIAGNOSIS — G43909 Migraine, unspecified, not intractable, without status migrainosus: Secondary | ICD-10-CM | POA: Diagnosis not present

## 2021-06-19 NOTE — Patient Instructions (Signed)
Medication Instructions:  Your physician recommends that you continue on your current medications as directed. Please refer to the Current Medication list given to you today.  *If you need a refill on your cardiac medications before your next appointment, please call your pharmacy*   Lab Work: Your physician recommends that you return for lab work in: TODAY CMP, Lipids If you have labs (blood work) drawn today and your tests are completely normal, you will receive your results only by: MyChart Message (if you have MyChart) OR A paper copy in the mail If you have any lab test that is abnormal or we need to change your treatment, we will call you to review the results.   Testing/Procedures: Your physician has requested that you have an echocardiogram. Echocardiography is a painless test that uses sound waves to create images of your heart. It provides your doctor with information about the size and shape of your heart and how well your heart's chambers and valves are working. This procedure takes approximately one hour. There are no restrictions for this procedure.     Follow-Up: At [facility], you and your health needs are our priority.  As part of our continuing mission to provide you with exceptional heart care, we have created designated Provider Care Teams.  These Care Teams include your primary Cardiologist (physician) and Advanced Practice Providers (APPs -  Physician Assistants and Nurse Practitioners) who all work together to provide you with the care you need, when you need it.  We recommend signing up for the patient portal called "MyChart".  Sign up information is provided on this After Visit Summary.  MyChart is used to connect with patients for Virtual Visits (Telemedicine).  Patients are able to view lab/test results, encounter notes, upcoming appointments, etc.  Non-urgent messages can be sent to your provider as well.   To learn more about what you can do with MyChart, go  to [email or web address].    Your next appointment:   6 month(s)  The format for your next appointment:   In Person  Provider:   Gypsy Balsam, MD   Other Instructions

## 2021-06-19 NOTE — Progress Notes (Signed)
Cardiology Office Note:    Date:  06/19/2021   ID:  Shannon Baxter, DOB 06-30-1961, MRN [id number]  PCP:  Paulina Fusi, MD  Cardiologist:  Gypsy Balsam, MD    Referring MD: Paulina Fusi, MD   Chief Complaint  Patient presents with   Follow-up  I have migraine  History of Present Illness:    Shannon Baxter is a 60 y.o. female   past medical history significant for aortic valve replacement with mechanical Saint Jude 23 mm prosthesis that was done at [facility], paroxysmal atrial fibrillation/flutter, cardiomyopathy with ejection fraction 40 to 45% assess on echocardiogram from November 2021, essential hypertension, diabetes, dyslipidemia. Comes today to my office for regular follow-up.  Overall she seems to be doing well cardiac wise.  Denies have any chest pain tightness squeezing pressure burning chest, fatigue and tiredness is still present.  For the last few months has been experiencing migraines this is some she never had before.  And today she does have migraine she feels poor.  Past Medical History:  Diagnosis Date   Acute on chronic systolic heart failure (HCC) 02/21/2016   Allergy    Aortic insufficiency    Aortic stenosis    Aortic valve stenosis, rheumatic 11/22/2015   Overview:  Appears to be severe basal latest echocardiogram peak gradient of 69 mmHg mean of 45 mmHg, calculated aortic valve area of 0.67 cm   Arthritis    Asthma    Atherosclerosis of native arteries of extremities with intermittent claudication, bilateral legs (HCC) 08/04/2015   Carotid atherosclerosis 07/26/2015   CHF (congestive heart failure) (HCC)    Chronic systolic congestive heart failure (HCC) 12/06/2016   Claudication (HCC)    Congestive heart failure (CHF) (HCC)    Coronary artery disease    Coronary artery disease involving native coronary artery of native heart without angina pectoris 07/26/2015   Diabetes mellitus without complication (HCC)    Dilated cardiomyopathy (HCC) 09/03/2016    Overview:  Ejection fraction 20-25%   Dyslipidemia 07/26/2015   Dyspnea on exertion 10/26/2015   Essential hypertension 07/26/2015   GERD (gastroesophageal reflux disease)    Heart murmur    History of mechanical aortic valve replacement 03/29/2016   Overview:  May 2017. St Judes 23 mm  Formatting of this note might be different from the original. Overview:  May 2017. St Judes 23 mm Formatting of this note might be different from the original. May 2017. St Judes 23 mm   History of tobacco abuse 07/26/2015   Hyperlipidemia    Hypertension    Hypothyroidism 01/23/2016   Intermittent claudication (HCC) 07/26/2015   Morbid obesity (HCC) 12/06/2016   Nutritional and metabolic cardiomyopathy (HCC)    Paroxysmal atrial fibrillation (HCC) 03/06/2016   Paroxysmal atrial flutter (HCC) 09/01/2018   Peripheral vascular disease (HCC) 07/26/2015   Presence of automatic implantable cardioverter-defibrillator 10/23/2016   PVD (peripheral vascular disease) (HCC)    Rheumatic aortic valve insufficiency 03/05/2016   Sleep apnea    Stroke (HCC)    Type 2 diabetes mellitus with diabetic peripheral angiopathy without gangrene, without long-term current use of insulin (HCC) 11/22/2015    Past Surgical History:  Procedure Laterality Date   A-FLUTTER ABLATION N/A 11/05/2018   Procedure: A-FLUTTER ABLATION;  Surgeon: Regan Lemming, MD;  Location: [facility];  Service: Cardiovascular;  Laterality: N/A;   CARDIAC CATHETERIZATION     INSERT / REPLACE / REMOVE PACEMAKER     Medtronic ICD   MECHANICAL AORTIC VALVE  REPLACEMENT     PERIPHERAL ARTERIAL STENT GRAFT     TUBAL LIGATION      Current Medications: Current Meds  Medication Sig   albuterol (PROVENTIL HFA;VENTOLIN HFA) 108 (90 Base) MCG/ACT inhaler Inhale 1-2 puffs into the lungs every 6 (six) hours as needed for wheezing or shortness of breath.   ALPRAZolam (XANAX) 0.5 MG tablet Take 0.5 mg by mouth 3 (three) times daily as needed for anxiety.     amiodarone (PACERONE) 200 MG tablet Take 200 mg by mouth daily after breakfast.    atorvastatin (LIPITOR) 80 MG tablet Take 80 mg by mouth daily.   carvedilol (COREG) 25 MG tablet TAKE 1 TABLET BY MOUTH TWICE A DAY (Patient taking differently: Take 25 mg by mouth 2 (two) times daily with a meal.)   cefdinir (OMNICEF) 300 MG capsule Take 300 mg by mouth 2 (two) times daily.   clopidogrel (PLAVIX) 75 MG tablet Take 75 mg by mouth daily after breakfast.    COMBIVENT RESPIMAT 20-100 MCG/ACT AERS respimat Inhale 1 puff into the lungs every 6 (six) hours as needed for wheezing or shortness of breath.   Cyanocobalamin (B-12) 500 MCG TABS Take 3 tablets by mouth daily.   EPIPEN 2-PAK 0.3 MG/0.3ML SOAJ injection Inject 0.3 mg into the muscle once.    ezetimibe (ZETIA) 10 MG tablet Take 10 mg by mouth daily.   fluticasone (FLONASE) 50 MCG/ACT nasal spray Place 2 sprays into both nostrils daily as needed for allergies.    furosemide (LASIX) 20 MG tablet TAKE 3 TABLETS BY MOUTH 2 TIMES DAILY. (Patient taking differently: Take 60 mg by mouth 2 (two) times daily.)   gabapentin (NEURONTIN) 300 MG capsule Take 300 mg by mouth 3 (three) times daily.    glimepiride (AMARYL) 4 MG tablet Take 4 mg by mouth daily with breakfast.    HYDROcodone-acetaminophen (NORCO) 10-325 MG tablet Take 1 tablet by mouth 4 (four) times daily as needed for moderate pain or severe pain.   ipratropium-albuterol (DUONEB) 0.5-2.5 (3) MG/3ML SOLN Take 3 mLs by nebulization 2 (two) times daily.    levothyroxine (SYNTHROID) 100 MCG tablet Take 100 mcg by mouth daily.   levothyroxine (SYNTHROID) 88 MCG tablet Take 88 mcg by mouth daily.   linaclotide (LINZESS) 72 MCG capsule Take 72 mcg by mouth daily as needed (constipation).   loratadine (CLARITIN) 10 MG tablet Take 10 mg by mouth at bedtime.    metFORMIN (GLUCOPHAGE) 500 MG tablet Take 500 mg by mouth 2 (two) times daily with a meal.    montelukast (SINGULAIR) 10 MG tablet Take 10 mg by  mouth at bedtime.    Multiple Vitamins-Minerals (CENTRUM SILVER ULTRA WOMENS) TABS Take 1 tablet by mouth daily. Unknown strength   nystatin (MYCOSTATIN/NYSTOP) powder Apply 1 application topically 3 (three) times daily. Unknown strength   omeprazole (PRILOSEC) 20 MG capsule Take 20 mg by mouth at bedtime.    ondansetron (ZOFRAN-ODT) 4 MG disintegrating tablet Take 4 mg by mouth every 6 (six) hours.   Potassium Chloride ER 20 MEQ TBCR TAKE 1 TABLET BY MOUTH EVERY DAY (Patient taking differently: Take 1 tablet by mouth daily.)   promethazine (PHENERGAN) 25 MG suppository Place 25 mg rectally every 6 (six) hours as needed for vomiting or nausea.   Promethazine-Codeine 6.25-10 MG/5ML SOLN Take 5 mLs by mouth every 4 (four) hours as needed (N&V).   Red Yeast Rice 600 MG TABS Take 2 tablets by mouth daily.   rosuvastatin (CRESTOR) 20 MG tablet  Take 1 tablet (20 mg total) by mouth daily.   sacubitril-valsartan (ENTRESTO) 49-51 MG Take 1 tablet by mouth 2 (two) times daily.   warfarin (COUMADIN) 4 MG tablet Take 4 mg by mouth one time only at 4 PM. Take 1 tablet daily except on WED and SUN when you take 5 mgs   warfarin (COUMADIN) 5 MG tablet Take 5 mg by mouth See admin instructions. Take 6 mg on Sunday, Tuesday, and  Thursday . Take 5 mg on all other days     Allergies:   Iodinated diagnostic agents and Ioxaglate   Social History   Socioeconomic History   Marital status: Single    Spouse name: Not on file   Number of children: Not on file   Years of education: Not on file   Highest education level: Not on file  Occupational History   Not on file  Tobacco Use   Smoking status: Former   Smokeless tobacco: Never  Vaping Use   Vaping Use: Never used  Substance and Sexual Activity   Alcohol use: No   Drug use: No   Sexual activity: Not on file  Other Topics Concern   Not on file  Social History Narrative   Not on file   Social Determinants of Health   Financial Resource Strain: Not  on file  Food Insecurity: Not on file  Transportation Needs: Not on file  Physical Activity: Not on file  Stress: Not on file  Social Connections: Not on file     Family History: The patient's family history includes Heart disease in her brother. ROS:   Please see the history of present illness.    All 14 point review of systems negative except as described per history of present illness  EKGs/Labs/Other Studies Reviewed:      Recent Labs: 12/29/2020: ALT 18  Recent Lipid Panel    Component Value Date/Time   CHOL 151 03/31/2021 1126   TRIG 110 03/31/2021 1126   HDL 46 03/31/2021 1126   CHOLHDL 3.3 03/31/2021 1126   CHOLHDL 4.3 07/04/2007 0815   VLDL 28 07/04/2007 0815   LDLCALC 85 03/31/2021 1126    Physical Exam:    VS:  BP (!) 152/84 (BP Location: Right Arm, Patient Position: Sitting)   Pulse 78   Ht 5\' 10"  (1.778 m)   Wt (!) 347 lb 3.2 oz (157.5 kg)   SpO2 94%   BMI 49.82 kg/m     Wt Readings from Last 3 Encounters:  06/19/21 (!) 347 lb 3.2 oz (157.5 kg)  11/16/20 (!) 348 lb (157.9 kg)  06/03/20 (!) 343 lb (155.6 kg)     GEN:  Well nourished, well developed in no acute distress HEENT: Normal NECK: No JVD; No carotid bruits LYMPHATICS: No lymphadenopathy CARDIAC: RRR, mechanical valve sounds are crisp no murmurs, no rubs, no gallops RESPIRATORY:  Clear to auscultation without rales, wheezing or rhonchi  ABDOMEN: Soft, non-tender, non-distended MUSCULOSKELETAL:  No edema; No deformity  SKIN: Warm and dry LOWER EXTREMITIES: no swelling NEUROLOGIC:  Alert and oriented x 3 PSYCHIATRIC:  Normal affect   ASSESSMENT:    1. Dilated cardiomyopathy (HCC)   2. Hyperlipidemia, unspecified hyperlipidemia type   3. Migraine without status migrainosus, not intractable, unspecified migraine type   4. History of mechanical aortic valve replacement   5. Dyslipidemia   6. Paroxysmal atrial fibrillation (HCC)    PLAN:    In order of problems listed  above:  Dilated cardiomyopathy time to repeat  echocardiogram which I will do.  I did review all her medications as appropriate.  Amaryl could be replaced with newer medication like Jardiance that be beneficial. Hyperlipidemia I will check her fasting blood profile she is on high-dose statin form for Lipitor which I will continue Migraine again she will refer to neurology. Paroxysmal atrial fibrillation, she is anticoagulated with Coumadin because of mechanical valve She only wants to have colonoscopy done.  I told her we will be able to give her guidelines for evaluation before that after we do echocardiogram.   Medication Adjustments/Labs and Tests Ordered: Current medicines are reviewed at length with the patient today.  Concerns regarding medicines are outlined above.  Orders Placed This Encounter  Procedures   Comprehensive metabolic panel   Lipid panel   Ambulatory referral to Neurology   EKG 12-Lead   ECHOCARDIOGRAM COMPLETE   Medication changes: No orders of the defined types were placed in this encounter.   Signed, Georgeanna Lea, MD, [facility] 06/19/2021 4:10 PM     [facility]

## 2021-06-20 LAB — LIPID PANEL
Chol/HDL Ratio: 3.7 ratio (ref 0.0–4.4)
Cholesterol, Total: 176 mg/dL (ref 100–199)
HDL: 48 mg/dL (ref >39)
LDL Chol Calc (NIH): 108 mg/dL — ABNORMAL HIGH (ref 0–99)
Triglycerides: 108 mg/dL (ref 0–149)
VLDL Cholesterol Cal: 20 mg/dL (ref 5–40)

## 2021-06-20 LAB — COMPREHENSIVE METABOLIC PANEL
ALT: 20 IU/L (ref 0–32)
AST: 16 IU/L (ref 0–40)
Albumin/Globulin Ratio: 1.2 (ref 1.2–2.2)
Albumin: 3.8 g/dL (ref 3.8–4.9)
Alkaline Phosphatase: 55 IU/L (ref 44–121)
BUN/Creatinine Ratio: 20 (ref 9–23)
BUN: 17 mg/dL (ref 6–24)
Bilirubin Total: 0.2 mg/dL (ref 0.0–1.2)
CO2: 24 mmol/L (ref 20–29)
Calcium: 8.8 mg/dL (ref 8.7–10.2)
Chloride: 107 mmol/L — ABNORMAL HIGH (ref 96–106)
Creatinine, Ser: 0.87 mg/dL (ref 0.57–1.00)
Globulin, Total: 3.2 g/dL (ref 1.5–4.5)
Glucose: 54 mg/dL — ABNORMAL LOW (ref 65–99)
Potassium: 4.3 mmol/L (ref 3.5–5.2)
Sodium: 145 mmol/L — ABNORMAL HIGH (ref 134–144)
Total Protein: 7 g/dL (ref 6.0–8.5)
eGFR: 77 mL/min/{1.73_m2} (ref >59)

## 2021-06-22 ENCOUNTER — Ambulatory Visit (INDEPENDENT_AMBULATORY_CARE_PROVIDER_SITE_OTHER): Payer: Medicare Other

## 2021-06-22 ENCOUNTER — Other Ambulatory Visit: Payer: Self-pay

## 2021-06-22 ENCOUNTER — Telehealth: Payer: Self-pay | Admitting: Cardiology

## 2021-06-22 DIAGNOSIS — R791 Abnormal coagulation profile: Secondary | ICD-10-CM | POA: Diagnosis not present

## 2021-06-22 DIAGNOSIS — I420 Dilated cardiomyopathy: Secondary | ICD-10-CM | POA: Diagnosis not present

## 2021-06-22 LAB — ECHOCARDIOGRAM COMPLETE
AR max vel: 0.78 cm2
AV Area VTI: 0.98 cm2
AV Area mean vel: 0.87 cm2
AV Mean grad: 11.5 mmHg
AV Peak grad: 24.2 mmHg
Ao pk vel: 2.46 m/s
Area-P 1/2: 4.12 cm2
Calc EF: 30.3 %
S' Lateral: 6.3 cm
Single Plane A2C EF: 29 %
Single Plane A4C EF: 28.4 %

## 2021-06-22 NOTE — Progress Notes (Signed)
Remote ICD transmission.   

## 2021-06-22 NOTE — Telephone Encounter (Signed)
Pt would like you to add Trelegy to her medications. It's her inhaler. She no longer uses "BRIO"  Thanks! Domingo Dimes

## 2021-06-22 NOTE — Telephone Encounter (Signed)
I have added the Trelegy inhaler and removed the Breo inhaler.

## 2021-06-27 DIAGNOSIS — Z1231 Encounter for screening mammogram for malignant neoplasm of breast: Secondary | ICD-10-CM | POA: Diagnosis not present

## 2021-07-04 ENCOUNTER — Other Ambulatory Visit: Payer: Self-pay | Admitting: Cardiology

## 2021-07-07 DIAGNOSIS — R791 Abnormal coagulation profile: Secondary | ICD-10-CM | POA: Diagnosis not present

## 2021-07-10 ENCOUNTER — Ambulatory Visit: Payer: Medicare Other | Admitting: Cardiology

## 2021-07-13 ENCOUNTER — Other Ambulatory Visit: Payer: Self-pay

## 2021-07-13 ENCOUNTER — Ambulatory Visit (INDEPENDENT_AMBULATORY_CARE_PROVIDER_SITE_OTHER): Payer: Medicare Other | Admitting: Sports Medicine

## 2021-07-13 DIAGNOSIS — E1140 Type 2 diabetes mellitus with diabetic neuropathy, unspecified: Secondary | ICD-10-CM

## 2021-07-13 DIAGNOSIS — E1165 Type 2 diabetes mellitus with hyperglycemia: Secondary | ICD-10-CM

## 2021-07-13 DIAGNOSIS — M779 Enthesopathy, unspecified: Secondary | ICD-10-CM

## 2021-07-13 DIAGNOSIS — IMO0002 Reserved for concepts with insufficient information to code with codable children: Secondary | ICD-10-CM

## 2021-07-17 DIAGNOSIS — G4733 Obstructive sleep apnea (adult) (pediatric): Secondary | ICD-10-CM | POA: Diagnosis not present

## 2021-07-19 ENCOUNTER — Ambulatory Visit (INDEPENDENT_AMBULATORY_CARE_PROVIDER_SITE_OTHER): Payer: Medicare Other | Admitting: Psychiatry

## 2021-07-19 ENCOUNTER — Telehealth: Payer: Self-pay | Admitting: Psychiatry

## 2021-07-19 ENCOUNTER — Encounter: Payer: Self-pay | Admitting: Psychiatry

## 2021-07-19 ENCOUNTER — Telehealth: Payer: Self-pay | Admitting: *Deleted

## 2021-07-19 VITALS — BP 214/112 | HR 73 | Ht 71.0 in | Wt 347.0 lb

## 2021-07-19 DIAGNOSIS — G43109 Migraine with aura, not intractable, without status migrainosus: Secondary | ICD-10-CM

## 2021-07-19 DIAGNOSIS — G4452 New daily persistent headache (NDPH): Secondary | ICD-10-CM

## 2021-07-19 MED ORDER — EMGALITY 120 MG/ML ~~LOC~~ SOAJ: 240.0000 mg | Freq: Once | SUBCUTANEOUS | 0 refills | Status: AC

## 2021-07-19 MED ORDER — EMGALITY 120 MG/ML ~~LOC~~ SOAJ: 1.0000 "pen " | SUBCUTANEOUS | 3 refills | Status: DC

## 2021-07-19 NOTE — Telephone Encounter (Signed)
[employer] medicare/medicaid order sent to [facility], [facility] they will reach out to the patient to schedule.  ?

## 2021-07-19 NOTE — Patient Instructions (Addendum)
Blood work to look for inflammation CT brain Start Emgality to prevent migraines. Take 2 shots for your first dose, then take one shot monthly.  Often people with chronic daily headache will have a component of medication overuse headache from overuse of over the counter pain medications like ibuprofen or tylenol. We recommend limiting the use of over the counter medications to less than 2 days per week or 10 days per month. Using these medications more frequently can contribute to the headache and even make it worse.  We use daily preventative medications to treat chronic daily. Lifestyle changes such as maintaining regular sleep, exercise, and a good diet can also help reduce headaches. Other non-medication options include behavioral therapy and biofeedback.
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
## 2021-07-19 NOTE — Telephone Encounter (Signed)
Emgality PA< key: [id number], G44.52. G43.109. she has failed gabapentin, carvedilol, valsarta, has hx of stroke, hypertension, heart failure. Your information has been sent to [facility].

## 2021-07-20 ENCOUNTER — Telehealth: Payer: Self-pay

## 2021-07-20 LAB — C-REACTIVE PROTEIN: CRP: 3 mg/L (ref 0–10)

## 2021-07-20 LAB — SEDIMENTATION RATE: Sed Rate: 61 mm/hr — ABNORMAL HIGH (ref 0–40)

## 2021-07-20 NOTE — Telephone Encounter (Signed)
Emgality approved through 10/21/21 , Medicare Part D. Approval letter faxed to pharmacy.

## 2021-07-20 NOTE — Telephone Encounter (Signed)
-----  Message from Shannon Harold, MD sent at 07/20/2021  9:30 AM EDT ----- ESR is mildly elevated which can be normal with the chronic medical issues that she has. It is not high enough for me to be concerned for an inflammatory disorder causing her headaches. However she should let me know if she develops significant worsening of headaches or changes in her vision.

## 2021-07-20 NOTE — Telephone Encounter (Signed)
Contacted pt regarding labs, informed her that ESR is mildly elevated which can be normal with the chronic medical issues that she has. It is not high enough for Dr Billey Gosling to be concerned for an inflammatory disorder causing her headaches. However she should let us know if she develops significant worsening of headaches or changes in her vision. Advised to call the office with questions as she had none at this time. She understood and was appreciative. Shannon Baxter

## 2021-07-21 DIAGNOSIS — R791 Abnormal coagulation profile: Secondary | ICD-10-CM | POA: Diagnosis not present

## 2021-07-21 DIAGNOSIS — E1142 Type 2 diabetes mellitus with diabetic polyneuropathy: Secondary | ICD-10-CM | POA: Diagnosis not present

## 2021-07-21 NOTE — Progress Notes (Signed)
Patient present for foam casting for 3 pair custom diabetic shoe inserts  Patient is measured with a brannock device to be a size 12 wide  Diabetic shoes are chosen from the [employer] catalog  The shoes chosen are [phone]  The patient will be contacted when the shoes and inserts are ready to be picked up

## 2021-07-24 ENCOUNTER — Other Ambulatory Visit: Payer: Self-pay | Admitting: Cardiology

## 2021-07-27 DIAGNOSIS — E1140 Type 2 diabetes mellitus with diabetic neuropathy, unspecified: Secondary | ICD-10-CM | POA: Diagnosis not present

## 2021-07-27 DIAGNOSIS — Z794 Long term (current) use of insulin: Secondary | ICD-10-CM | POA: Diagnosis not present

## 2021-07-31 ENCOUNTER — Ambulatory Visit
Admission: RE | Admit: 2021-07-31 | Discharge: 2021-07-31 | Disposition: A | Discharge status: Home / Self Care | Payer: Medicare Other | Source: Ambulatory Visit | Attending: Psychiatry | Admitting: Psychiatry

## 2021-07-31 DIAGNOSIS — G4452 New daily persistent headache (NDPH): Secondary | ICD-10-CM | POA: Diagnosis not present

## 2021-07-31 DIAGNOSIS — R519 Headache, unspecified: Secondary | ICD-10-CM | POA: Diagnosis not present

## 2021-08-02 ENCOUNTER — Telehealth: Payer: Self-pay

## 2021-08-02 NOTE — Telephone Encounter (Signed)
-----   Message from Ocie Doyne, MD sent at 08/02/2021  9:40 AM EDT ----- CT scan of the brain looks normal

## 2021-08-02 NOTE — Telephone Encounter (Signed)
Contacted pt, informed her CT scan of brain looks norman, no concerns. Advised to call the office with questions as she had none at the time and was appreciation.

## 2021-08-03 DIAGNOSIS — E785 Hyperlipidemia, unspecified: Secondary | ICD-10-CM | POA: Diagnosis not present

## 2021-08-03 DIAGNOSIS — E039 Hypothyroidism, unspecified: Secondary | ICD-10-CM | POA: Diagnosis not present

## 2021-08-03 DIAGNOSIS — R791 Abnormal coagulation profile: Secondary | ICD-10-CM | POA: Diagnosis not present

## 2021-08-03 DIAGNOSIS — E1142 Type 2 diabetes mellitus with diabetic polyneuropathy: Secondary | ICD-10-CM | POA: Diagnosis not present

## 2021-08-03 DIAGNOSIS — Z79899 Other long term (current) drug therapy: Secondary | ICD-10-CM | POA: Diagnosis not present

## 2021-08-07 ENCOUNTER — Other Ambulatory Visit: Payer: Medicare Other

## 2021-08-07 DIAGNOSIS — Z7901 Long term (current) use of anticoagulants: Secondary | ICD-10-CM | POA: Diagnosis not present

## 2021-08-11 ENCOUNTER — Other Ambulatory Visit: Payer: Self-pay | Admitting: Cardiology

## 2021-08-11 DIAGNOSIS — R791 Abnormal coagulation profile: Secondary | ICD-10-CM | POA: Diagnosis not present

## 2021-08-14 DIAGNOSIS — E1142 Type 2 diabetes mellitus with diabetic polyneuropathy: Secondary | ICD-10-CM | POA: Diagnosis not present

## 2021-08-15 DIAGNOSIS — R791 Abnormal coagulation profile: Secondary | ICD-10-CM | POA: Diagnosis not present

## 2021-08-22 DIAGNOSIS — R791 Abnormal coagulation profile: Secondary | ICD-10-CM | POA: Diagnosis not present

## 2021-08-23 ENCOUNTER — Ambulatory Visit: Payer: Medicare Other | Admitting: Sports Medicine

## 2021-08-24 DIAGNOSIS — J019 Acute sinusitis, unspecified: Secondary | ICD-10-CM | POA: Diagnosis not present

## 2021-08-24 DIAGNOSIS — B9689 Other specified bacterial agents as the cause of diseases classified elsewhere: Secondary | ICD-10-CM | POA: Diagnosis not present

## 2021-08-24 DIAGNOSIS — J208 Acute bronchitis due to other specified organisms: Secondary | ICD-10-CM | POA: Diagnosis not present

## 2021-08-24 DIAGNOSIS — R112 Nausea with vomiting, unspecified: Secondary | ICD-10-CM | POA: Diagnosis not present

## 2021-08-29 ENCOUNTER — Ambulatory Visit (INDEPENDENT_AMBULATORY_CARE_PROVIDER_SITE_OTHER): Payer: Medicare Other

## 2021-08-29 DIAGNOSIS — I420 Dilated cardiomyopathy: Secondary | ICD-10-CM | POA: Diagnosis not present

## 2021-08-30 LAB — CUP PACEART REMOTE DEVICE CHECK
Battery Remaining Longevity: 70 mo
Battery Voltage: 2.96 V
Brady Statistic AP VP Percent: 0.01 %
Brady Statistic AP VS Percent: 3.07 %
Brady Statistic AS VP Percent: 0.05 %
Brady Statistic AS VS Percent: 96.87 %
Brady Statistic RA Percent Paced: 3.07 %
Brady Statistic RV Percent Paced: 0.06 %
Date Time Interrogation Session: 20221109052506
HighPow Impedance: 70 Ohm
Implantable Lead Implant Date: 20180102
Implantable Lead Implant Date: 20180102
Implantable Lead Location: 753859
Implantable Lead Location: 753860
Implantable Lead Model: 5076
Implantable Pulse Generator Implant Date: 20180102
Lead Channel Impedance Value: 323 Ohm
Lead Channel Impedance Value: 399 Ohm
Lead Channel Impedance Value: 456 Ohm
Lead Channel Pacing Threshold Amplitude: 0.375 V
Lead Channel Pacing Threshold Amplitude: 0.75 V
Lead Channel Pacing Threshold Pulse Width: 0.4 ms
Lead Channel Pacing Threshold Pulse Width: 0.4 ms
Lead Channel Sensing Intrinsic Amplitude: 25.875 mV
Lead Channel Sensing Intrinsic Amplitude: 25.875 mV
Lead Channel Sensing Intrinsic Amplitude: 4 mV
Lead Channel Sensing Intrinsic Amplitude: 4 mV
Lead Channel Setting Pacing Amplitude: 1.5 V
Lead Channel Setting Pacing Amplitude: 2 V
Lead Channel Setting Pacing Pulse Width: 0.4 ms
Lead Channel Setting Sensing Sensitivity: 0.3 mV

## 2021-08-31 DIAGNOSIS — J4540 Moderate persistent asthma, uncomplicated: Secondary | ICD-10-CM | POA: Diagnosis not present

## 2021-08-31 DIAGNOSIS — G4733 Obstructive sleep apnea (adult) (pediatric): Secondary | ICD-10-CM | POA: Diagnosis not present

## 2021-08-31 DIAGNOSIS — J301 Allergic rhinitis due to pollen: Secondary | ICD-10-CM | POA: Diagnosis not present

## 2021-09-05 DIAGNOSIS — R791 Abnormal coagulation profile: Secondary | ICD-10-CM | POA: Diagnosis not present

## 2021-09-06 NOTE — Progress Notes (Signed)
Remote ICD transmission.   

## 2021-09-07 DIAGNOSIS — Z7901 Long term (current) use of anticoagulants: Secondary | ICD-10-CM | POA: Diagnosis not present

## 2021-09-13 ENCOUNTER — Ambulatory Visit (INDEPENDENT_AMBULATORY_CARE_PROVIDER_SITE_OTHER): Payer: Medicare Other | Admitting: Sports Medicine

## 2021-09-13 ENCOUNTER — Encounter: Payer: Self-pay | Admitting: Sports Medicine

## 2021-09-13 DIAGNOSIS — M79675 Pain in left toe(s): Secondary | ICD-10-CM

## 2021-09-13 DIAGNOSIS — E119 Type 2 diabetes mellitus without complications: Secondary | ICD-10-CM

## 2021-09-13 DIAGNOSIS — B351 Tinea unguium: Secondary | ICD-10-CM | POA: Diagnosis not present

## 2021-09-13 DIAGNOSIS — M2142 Flat foot [pes planus] (acquired), left foot: Secondary | ICD-10-CM

## 2021-09-13 DIAGNOSIS — M2040 Other hammer toe(s) (acquired), unspecified foot: Secondary | ICD-10-CM

## 2021-09-13 DIAGNOSIS — M2141 Flat foot [pes planus] (acquired), right foot: Secondary | ICD-10-CM

## 2021-09-13 DIAGNOSIS — M79674 Pain in right toe(s): Secondary | ICD-10-CM | POA: Diagnosis not present

## 2021-09-13 NOTE — Progress Notes (Signed)
Subjective: Shannon Baxter is a 60 y.o. female patient with history of diabetes who presents to office today complaining of long, painful nails while ambulating in shoes; unable to trim. Patient states that the glucose reading this morning was not recorded but on yesterday was 125.  Last A1c of 6 last visit to PCP Dr Delena Bali was 3 weeks ago.  Patient is also here for pickup of diabetic shoes.  Patient denies any other pedal complaints at this time.  Patient Active Problem List   Diagnosis Date Noted   CHF (congestive heart failure) ([place])    Stroke ([place])    Sleep apnea    PVD (peripheral vascular disease) ([place])    Nutritional and metabolic cardiomyopathy ([place])    Hypertension    Hyperlipidemia    Heart murmur    GERD (gastroesophageal reflux disease)    Diabetes mellitus without complication (HCC)    Claudication (HCC)    Congestive heart failure (CHF) (HCC)    Asthma    Arthritis    Aortic stenosis    Aortic insufficiency    Allergy    Coronary artery disease    Paroxysmal atrial flutter ([place]) 99/37/1696   Chronic systolic congestive heart failure ([place]) 12/06/2016   Morbid obesity ([place]) 12/06/2016   Presence of automatic implantable cardioverter-defibrillator 10/23/2016   Dilated cardiomyopathy ([place]) 09/03/2016   History of mechanical aortic valve replacement 03/29/2016   Paroxysmal atrial fibrillation ([place]) 03/06/2016   Rheumatic aortic valve insufficiency 03/05/2016   Acute on chronic systolic heart failure ([place]) 02/21/2016   Hypothyroidism 01/23/2016   Type 2 diabetes mellitus with diabetic peripheral angiopathy without gangrene, without long-term current use of insulin ([place]) 11/22/2015   Dyspnea on exertion 10/26/2015   Atherosclerosis of native arteries of extremities with intermittent claudication, bilateral legs ([place]) 08/04/2015   Carotid atherosclerosis 07/26/2015   Coronary artery disease involving native coronary artery of native heart without angina pectoris 07/26/2015    Dyslipidemia 07/26/2015   Essential hypertension 07/26/2015   Intermittent claudication ([place]) 07/26/2015   Peripheral vascular disease ([place]) 07/26/2015   History of tobacco abuse 07/26/2015   Current Outpatient Medications on File Prior to Visit  Medication Sig Dispense Refill   albuterol (PROVENTIL HFA;VENTOLIN HFA) 108 (90 Base) MCG/ACT inhaler Inhale 1-2 puffs into the lungs every 6 (six) hours as needed for wheezing or shortness of breath.     ALPRAZolam (XANAX) 0.5 MG tablet Take 0.5 mg by mouth 3 (three) times daily as needed for anxiety.   0   amiodarone (PACERONE) 200 MG tablet Take 200 mg by mouth daily after breakfast.   0   atorvastatin (LIPITOR) 80 MG tablet Take 80 mg by mouth daily.     carvedilol (COREG) 25 MG tablet TAKE 1 TABLET BY MOUTH TWICE A DAY (Patient taking differently: Take 25 mg by mouth 2 (two) times daily with a meal.) 180 tablet 3   cefdinir (OMNICEF) 300 MG capsule Take 300 mg by mouth 2 (two) times daily.     clopidogrel (PLAVIX) 75 MG tablet Take 75 mg by mouth daily after breakfast.   6   COMBIVENT RESPIMAT 20-100 MCG/ACT AERS respimat Inhale 1 puff into the lungs every 6 (six) hours as needed for wheezing or shortness of breath.     Cyanocobalamin (B-12) 500 MCG TABS Take 3 tablets by mouth daily.     ENTRESTO 49-51 MG TAKE 1 TABLET BY MOUTH 2 TIMES DAILY 180 tablet 3   EPIPEN 2-PAK 0.3 MG/0.3ML SOAJ injection Inject 0.3 mg  into the muscle once.   0   ezetimibe (ZETIA) 10 MG tablet Take 10 mg by mouth daily.     fluticasone (FLONASE) 50 MCG/ACT nasal spray Place 2 sprays into both nostrils daily as needed for allergies.      Fluticasone-Umeclidin-Vilant (TRELEGY ELLIPTA IN) Inhale 1-2 puffs into the lungs daily.     furosemide (LASIX) 20 MG tablet TAKE 3 TABLETS BY MOUTH 2 TIMES DAILY. (Patient taking differently: Take 60 mg by mouth 2 (two) times daily.) 540 tablet 2   gabapentin (NEURONTIN) 300 MG capsule Take 300 mg by mouth 3 (three) times daily.   5    Galcanezumab-gnlm (EMGALITY) 120 MG/ML SOAJ Inject 1 pen into the skin every 30 (thirty) days. 1.12 mL 3   glimepiride (AMARYL) 4 MG tablet Take 4 mg by mouth daily with breakfast.      HYDROcodone-acetaminophen (NORCO) 10-325 MG tablet Take 1 tablet by mouth 4 (four) times daily as needed for moderate pain or severe pain.     ipratropium-albuterol (DUONEB) 0.5-2.5 (3) MG/3ML SOLN Take 3 mLs by nebulization 2 (two) times daily.      levothyroxine (SYNTHROID) 100 MCG tablet Take 100 mcg by mouth daily.     levothyroxine (SYNTHROID) 88 MCG tablet Take 88 mcg by mouth daily.     linaclotide (LINZESS) 72 MCG capsule Take 72 mcg by mouth daily as needed (constipation).     loratadine (CLARITIN) 10 MG tablet Take 10 mg by mouth at bedtime.      metFORMIN (GLUCOPHAGE) 500 MG tablet Take 500 mg by mouth 2 (two) times daily with a meal.      montelukast (SINGULAIR) 10 MG tablet Take 10 mg by mouth at bedtime.      Multiple Vitamins-Minerals (CENTRUM SILVER ULTRA WOMENS) TABS Take 1 tablet by mouth daily. Unknown strength     nystatin (MYCOSTATIN/NYSTOP) powder Apply 1 application topically 3 (three) times daily. Unknown strength     omeprazole (PRILOSEC) 20 MG capsule Take 20 mg by mouth at bedtime.      ondansetron (ZOFRAN-ODT) 4 MG disintegrating tablet Take 4 mg by mouth every 6 (six) hours.     Potassium Chloride ER 20 MEQ TBCR TAKE 1 TABLET BY MOUTH EVERY DAY 90 tablet 2   promethazine (PHENERGAN) 25 MG suppository Place 25 mg rectally every 6 (six) hours as needed for vomiting or nausea.     Promethazine-Codeine 6.25-10 MG/5ML SOLN Take 5 mLs by mouth every 4 (four) hours as needed (N&V).     Red Yeast Rice 600 MG TABS Take 2 tablets by mouth daily.     rosuvastatin (CRESTOR) 20 MG tablet TAKE 1 TABLET BY MOUTH EVERY DAY 90 tablet 2   warfarin (COUMADIN) 4 MG tablet Take 4 mg by mouth one time only at 4 PM. Take 1 tablet daily except on WED and SUN when you take 5 mgs     warfarin (COUMADIN) 5 MG  tablet Take 5 mg by mouth See admin instructions. Take 6 mg on Sunday, Tuesday, and  Thursday . Take 5 mg on all other days     No current facility-administered medications on file prior to visit.   Allergies  Allergen Reactions   Iodinated Diagnostic Agents Hives and Rash   Ioxaglate Hives    Recent Results (from the past 2160 hour(s))  Comprehensive metabolic panel     Status: Abnormal   Collection Time: 06/19/21  4:24 PM  Result Value Ref Range   Glucose 54 (L) 65 -  99 mg/dL   BUN 17 6 - 24 mg/dL   Creatinine, Ser 0.87 0.57 - 1.00 mg/dL   eGFR 77 >59 mL/min/1.73   BUN/Creatinine Ratio 20 9 - 23   Sodium 145 (H) 134 - 144 mmol/L   Potassium 4.3 3.5 - 5.2 mmol/L   Chloride 107 (H) 96 - 106 mmol/L   CO2 24 20 - 29 mmol/L   Calcium 8.8 8.7 - 10.2 mg/dL   Total Protein 7.0 6.0 - 8.5 g/dL   Albumin 3.8 3.8 - 4.9 g/dL   Globulin, Total 3.2 1.5 - 4.5 g/dL   Albumin/Globulin Ratio 1.2 1.2 - 2.2   Bilirubin Total 0.2 0.0 - 1.2 mg/dL   Alkaline Phosphatase 55 44 - 121 IU/L   AST 16 0 - 40 IU/L   ALT 20 0 - 32 IU/L  Lipid panel     Status: Abnormal   Collection Time: 06/19/21  4:24 PM  Result Value Ref Range   Cholesterol, Total 176 100 - 199 mg/dL   Triglycerides 108 0 - 149 mg/dL   HDL 48 >39 mg/dL   VLDL Cholesterol Cal 20 5 - 40 mg/dL   LDL Chol Calc (NIH) 108 (H) 0 - 99 mg/dL   Chol/HDL Ratio 3.7 0.0 - 4.4 ratio    Comment:                                   T. Chol/HDL Ratio                                             Men  Women                               1/2 Avg.Risk  3.4    3.3                                   Avg.Risk  5.0    4.4                                2X Avg.Risk  9.6    7.1                                3X Avg.Risk 23.4   11.0   ECHOCARDIOGRAM COMPLETE     Status: None   Collection Time: 06/22/21 12:15 PM  Result Value Ref Range   S' Lateral 6.30 cm   Single Plane A4C EF 28.4 %   Area-P 1/2 4.12 cm2   Single Plane A2C EF 29.0 %   Calc EF 30.3 %    AV Area mean vel 0.87 cm2   AV Area VTI 0.98 cm2   AR max vel 0.78 cm2   AV Mean grad 11.5 mmHg   Ao pk vel 2.46 m/s   AV Peak grad 24.2 mmHg  Sedimentation Rate     Status: Abnormal   Collection Time: 07/19/21 11:07 AM  Result Value Ref Range   Sed Rate 61 (H) 0 - 40 mm/hr  C-reactive Protein  Status: None   Collection Time: 07/19/21 11:07 AM  Result Value Ref Range   CRP 3 0 - 10 mg/L  CUP PACEART REMOTE DEVICE CHECK     Status: None   Collection Time: 08/30/21  5:25 AM  Result Value Ref Range   Date Time Interrogation Session [id number]    Pulse Generator Manufacturer MERM    Pulse Gen Model [id number] Evera MRI XT DR    Pulse Gen Serial Number [id number]    Clinic Name [facility] Pulse Generator Type Implantable Cardiac Defibulator    Implantable Pulse Generator Implant Date 22025427    Implantable Lead Manufacturer MERM    Implantable Lead Model 5076 CapSureFix Novus MRI SureScan    Implantable Lead Serial Number [id number]    Implantable Lead Implant Date [id number]    Implantable Lead Location Detail 1 UNKNOWN    Implantable Lead Location [id number]    Implantable Lead Manufacturer [facility]    Implantable Lead Model [id number] Sprint Quattro Secure S MRI SureScan    Implantable Lead Serial Number [id number]    Implantable Lead Implant Date [id number]    Implantable Lead Location Detail 1 UNKNOWN    Implantable Lead Location [id number]    Lead Channel Setting Sensing Sensitivity 0.3 mV   Lead Channel Setting Pacing Amplitude 1.5 V   Lead Channel Setting Pacing Pulse Width 0.4 ms   Lead Channel Setting Pacing Amplitude 2 V   Lead Channel Impedance Value 456 ohm   Lead Channel Sensing Intrinsic Amplitude 4 mV   Lead Channel Sensing Intrinsic Amplitude 4 mV   Lead Channel Pacing Threshold Amplitude 0.375 V   Lead Channel Pacing Threshold Pulse Width 0.4 ms   Lead Channel Impedance Value 399 ohm   Lead Channel Impedance Value 323 ohm   Lead Channel Sensing Intrinsic  Amplitude 25.875 mV   Lead Channel Sensing Intrinsic Amplitude 25.875 mV   Lead Channel Pacing Threshold Amplitude 0.75 V   Lead Channel Pacing Threshold Pulse Width 0.4 ms   HighPow Impedance 70 ohm   Battery Status OK    Battery Remaining Longevity 70 mo   Battery Voltage 2.96 V   Brady Statistic RA Percent Paced 3.07 %   Brady Statistic RV Percent Paced 0.06 %   Brady Statistic AP VP Percent 0.01 %   Brady Statistic AS VP Percent 0.05 %   Brady Statistic AP VS Percent 3.07 %   Brady Statistic AS VS Percent 96.87 %    Objective: General: Patient is awake, alert, and oriented x 3 and in no acute distress.  Integument: Skin is warm, dry and supple bilateral. Nails are tender, long, thickened and dystrophic with subungual debris, consistent with onychomycosis, 1-5 bilateral minimal ingrowing on the right great toe, no acute signs or symptoms of infection.   Remaining integument unremarkable.  Neurovascular status unchanged.   Musculoskeletal: Asymptomatic pes planus foot type and hammertoe deformity.  Muscular strength 5/5 in all lower extremity muscular groups bilateral. No tenderness with calf compression bilateral.  Assessment and Plan: Problem List Items Addressed This Visit       Endocrine   Diabetes mellitus without complication ([place])   Other Visit Diagnoses     Pain due to onychomycosis of toenails of both feet    -  Primary   Pes planus of both feet       Hammer toe, unspecified laterality          -Examined patient. -Re-Discussed and educated patient on diabetic foot  care -Mechanically debrided all nails 1-5 bilateral using sterile nail nipper and filed with dremel without incident and removed any offending borders using sterile nail nipper without incident. -Diabetic shoes were dispensed to patient patient reports that the shoes fit well able to ambulate without pain or discomfort and was well pleased with the shoes -Patient to return  in 10 to 12 weeks for at  risk foot care like previous -Patient advised to call the office if any problems or questions arise in the meantime.  Landis Martins, DPM

## 2021-09-15 DIAGNOSIS — R791 Abnormal coagulation profile: Secondary | ICD-10-CM | POA: Diagnosis not present

## 2021-09-18 DIAGNOSIS — R791 Abnormal coagulation profile: Secondary | ICD-10-CM | POA: Diagnosis not present

## 2021-09-20 ENCOUNTER — Ambulatory Visit: Payer: Medicare Other

## 2021-09-20 ENCOUNTER — Other Ambulatory Visit: Payer: Self-pay

## 2021-09-20 DIAGNOSIS — E119 Type 2 diabetes mellitus without complications: Secondary | ICD-10-CM

## 2021-09-20 NOTE — Progress Notes (Signed)
SITUATION Reason for Consult: Follow-up with diabetic shoes and insoles Patient / Caregiver Report: Patient reports her shoes are loose  OBJECTIVE DATA History / Diagnosis: Diabetes mellitus without complication (HCC) Change in Pathology: None  ACTIONS PERFORMED Patient's equipment was checked for structural stability and fit. Instructed patient in proper shoe tying technique and demonstrated runner's lock lacing technique. Device(s) intact and fit is excellent. All questions answered and concerns addressed.  PLAN Follow-up as needed (PRN). Plan of care discussed with and agreed upon by patient / caregiver.

## 2021-09-21 ENCOUNTER — Ambulatory Visit: Payer: Medicare Other

## 2021-09-23 DIAGNOSIS — B9689 Other specified bacterial agents as the cause of diseases classified elsewhere: Secondary | ICD-10-CM | POA: Diagnosis not present

## 2021-09-23 DIAGNOSIS — J019 Acute sinusitis, unspecified: Secondary | ICD-10-CM | POA: Diagnosis not present

## 2021-09-23 DIAGNOSIS — J208 Acute bronchitis due to other specified organisms: Secondary | ICD-10-CM | POA: Diagnosis not present

## 2021-09-26 DIAGNOSIS — R791 Abnormal coagulation profile: Secondary | ICD-10-CM | POA: Diagnosis not present

## 2021-10-03 DIAGNOSIS — R791 Abnormal coagulation profile: Secondary | ICD-10-CM | POA: Diagnosis not present

## 2021-10-05 DIAGNOSIS — R791 Abnormal coagulation profile: Secondary | ICD-10-CM | POA: Diagnosis not present

## 2021-10-05 DIAGNOSIS — R69 Illness, unspecified: Secondary | ICD-10-CM | POA: Diagnosis not present

## 2021-10-06 DIAGNOSIS — E119 Type 2 diabetes mellitus without complications: Secondary | ICD-10-CM | POA: Diagnosis not present

## 2021-10-12 DIAGNOSIS — R791 Abnormal coagulation profile: Secondary | ICD-10-CM | POA: Diagnosis not present

## 2021-10-17 DIAGNOSIS — G4733 Obstructive sleep apnea (adult) (pediatric): Secondary | ICD-10-CM | POA: Diagnosis not present

## 2021-10-19 DIAGNOSIS — R791 Abnormal coagulation profile: Secondary | ICD-10-CM | POA: Diagnosis not present

## 2021-10-24 ENCOUNTER — Ambulatory Visit: Payer: Medicare Other | Admitting: Psychiatry

## 2021-10-25 ENCOUNTER — Telehealth: Payer: Self-pay | Admitting: *Deleted

## 2021-10-25 NOTE — Telephone Encounter (Signed)
[employer] PA, key [id number] This medication or product was previously approved on [id number] from 2021-10-22 to 2022-10-21.

## 2021-11-03 DIAGNOSIS — R791 Abnormal coagulation profile: Secondary | ICD-10-CM | POA: Diagnosis not present

## 2021-11-17 DIAGNOSIS — R69 Illness, unspecified: Secondary | ICD-10-CM | POA: Diagnosis not present

## 2021-11-17 DIAGNOSIS — R791 Abnormal coagulation profile: Secondary | ICD-10-CM | POA: Diagnosis not present

## 2021-11-28 ENCOUNTER — Ambulatory Visit (INDEPENDENT_AMBULATORY_CARE_PROVIDER_SITE_OTHER): Payer: Medicare Other

## 2021-11-28 DIAGNOSIS — I420 Dilated cardiomyopathy: Secondary | ICD-10-CM | POA: Diagnosis not present

## 2021-11-28 LAB — CUP PACEART REMOTE DEVICE CHECK
Battery Remaining Longevity: 63 mo
Battery Voltage: 2.98 V
Brady Statistic AP VP Percent: 0.01 %
Brady Statistic AP VS Percent: 0.94 %
Brady Statistic AS VP Percent: 0.05 %
Brady Statistic AS VS Percent: 99 %
Brady Statistic RA Percent Paced: 0.95 %
Brady Statistic RV Percent Paced: 0.06 %
Date Time Interrogation Session: 20230207012204
HighPow Impedance: 81 Ohm
Implantable Lead Implant Date: 20180102
Implantable Lead Implant Date: 20180102
Implantable Lead Location: 753859
Implantable Lead Location: 753860
Implantable Lead Model: 5076
Implantable Pulse Generator Implant Date: 20180102
Lead Channel Impedance Value: 342 Ohm
Lead Channel Impedance Value: 380 Ohm
Lead Channel Impedance Value: 456 Ohm
Lead Channel Pacing Threshold Amplitude: 0.375 V
Lead Channel Pacing Threshold Amplitude: 1 V
Lead Channel Pacing Threshold Pulse Width: 0.4 ms
Lead Channel Pacing Threshold Pulse Width: 0.4 ms
Lead Channel Sensing Intrinsic Amplitude: 24.125 mV
Lead Channel Sensing Intrinsic Amplitude: 24.125 mV
Lead Channel Sensing Intrinsic Amplitude: 3.875 mV
Lead Channel Sensing Intrinsic Amplitude: 3.875 mV
Lead Channel Setting Pacing Amplitude: 1.5 V
Lead Channel Setting Pacing Amplitude: 2.25 V
Lead Channel Setting Pacing Pulse Width: 0.4 ms
Lead Channel Setting Sensing Sensitivity: 0.3 mV

## 2021-12-01 DIAGNOSIS — R791 Abnormal coagulation profile: Secondary | ICD-10-CM | POA: Diagnosis not present

## 2021-12-01 NOTE — Progress Notes (Signed)
Remote ICD transmission.   

## 2021-12-04 DIAGNOSIS — R791 Abnormal coagulation profile: Secondary | ICD-10-CM | POA: Diagnosis not present

## 2021-12-07 DIAGNOSIS — J301 Allergic rhinitis due to pollen: Secondary | ICD-10-CM | POA: Diagnosis not present

## 2021-12-07 DIAGNOSIS — J4540 Moderate persistent asthma, uncomplicated: Secondary | ICD-10-CM | POA: Diagnosis not present

## 2021-12-07 DIAGNOSIS — G4733 Obstructive sleep apnea (adult) (pediatric): Secondary | ICD-10-CM | POA: Diagnosis not present

## 2021-12-07 DIAGNOSIS — J069 Acute upper respiratory infection, unspecified: Secondary | ICD-10-CM | POA: Diagnosis not present

## 2021-12-08 DIAGNOSIS — J208 Acute bronchitis due to other specified organisms: Secondary | ICD-10-CM | POA: Diagnosis not present

## 2021-12-08 DIAGNOSIS — B9689 Other specified bacterial agents as the cause of diseases classified elsewhere: Secondary | ICD-10-CM | POA: Diagnosis not present

## 2021-12-08 DIAGNOSIS — J019 Acute sinusitis, unspecified: Secondary | ICD-10-CM | POA: Diagnosis not present

## 2021-12-11 DIAGNOSIS — R791 Abnormal coagulation profile: Secondary | ICD-10-CM | POA: Diagnosis not present

## 2021-12-18 DIAGNOSIS — R791 Abnormal coagulation profile: Secondary | ICD-10-CM | POA: Diagnosis not present

## 2021-12-19 ENCOUNTER — Other Ambulatory Visit: Payer: Self-pay

## 2021-12-19 ENCOUNTER — Encounter: Payer: Self-pay | Admitting: Cardiology

## 2021-12-19 ENCOUNTER — Ambulatory Visit (INDEPENDENT_AMBULATORY_CARE_PROVIDER_SITE_OTHER): Payer: Medicare Other | Admitting: Cardiology

## 2021-12-19 VITALS — BP 138/75 | HR 75 | Ht 71.0 in | Wt 346.0 lb

## 2021-12-19 DIAGNOSIS — Z87891 Personal history of nicotine dependence: Secondary | ICD-10-CM

## 2021-12-19 DIAGNOSIS — R0609 Other forms of dyspnea: Secondary | ICD-10-CM

## 2021-12-19 DIAGNOSIS — Z952 Presence of prosthetic heart valve: Secondary | ICD-10-CM | POA: Diagnosis not present

## 2021-12-19 DIAGNOSIS — I420 Dilated cardiomyopathy: Secondary | ICD-10-CM

## 2021-12-19 DIAGNOSIS — I5022 Chronic systolic (congestive) heart failure: Secondary | ICD-10-CM | POA: Diagnosis not present

## 2021-12-19 NOTE — Patient Instructions (Signed)
Medication Instructions:  °Your physician recommends that you continue on your current medications as directed. Please refer to the Current Medication list given to you today. ° °*If you need a refill on your cardiac medications before your next appointment, please call your pharmacy* ° ° °Lab Work: °None °If you have labs (blood work) drawn today and your tests are completely normal, you will receive your results only by: °MyChart Message (if you have MyChart) OR °A paper copy in the mail °If you have any lab test that is abnormal or we need to change your treatment, we will call you to review the results. ° ° °Testing/Procedures: °None ° ° °Follow-Up: °At [facility], you and your health needs are our priority.  As part of our continuing mission to provide you with exceptional heart care, we have created designated Provider Care Teams.  These Care Teams include your primary Cardiologist (physician) and Advanced Practice Providers (APPs -  Physician Assistants and Nurse Practitioners) who all work together to provide you with the care you need, when you need it. ° °We recommend signing up for the patient portal called "MyChart".  Sign up information is provided on this After Visit Summary.  MyChart is used to connect with patients for Virtual Visits (Telemedicine).  Patients are able to view lab/test results, encounter notes, upcoming appointments, etc.  Non-urgent messages can be sent to your provider as well.   °To learn more about what you can do with MyChart, go to [email or web address].   ° °Your next appointment:   °3 month(s) ° °The format for your next appointment:   °In Person ° °Provider:   °Robert Krasowski, MD  ° ° °Other Instructions °None ° °

## 2021-12-19 NOTE — Progress Notes (Signed)
Cardiology Office Note:    Date:  12/19/2021   ID:  Shannon Baxter, DOB 07-Jul-1961, MRN [id number]  PCP:  Paulina Fusi, MD  Cardiologist:  Gypsy Balsam, MD    Referring MD: Paulina Fusi, MD   Chief Complaint  Patient presents with   Follow-up  I am getting better  History of Present Illness:    Shannon Baxter is a 61 y.o. female    past medical history significant for aortic valve replacement with mechanical Saint Jude 23 mm prosthesis that was done at [facility], paroxysmal atrial fibrillation/flutter, cardiomyopathy with ejection fraction 40 to 45% assess on echocardiogram from November 2021, essential hypertension, diabetes, dyslipidemia. She comes today to my office for regular follow-up.  Overall she seems to be doing well.  She was suffering from some bronchitis for quite some antibiotic still have difficulty shaking to self but overall swelling is not better.  Shortness of breath is improving no palpitations dizziness swelling of lower extremities  Past Medical History:  Diagnosis Date   Acute on chronic systolic heart failure (HCC) 02/21/2016   Allergy    Aortic insufficiency    Aortic stenosis    Aortic valve stenosis, rheumatic 11/22/2015   Overview:  Appears to be severe basal latest echocardiogram peak gradient of 69 mmHg mean of 45 mmHg, calculated aortic valve area of 0.67 cm   Arthritis    Asthma    Atherosclerosis of native arteries of extremities with intermittent claudication, bilateral legs (HCC) 08/04/2015   Carotid atherosclerosis 07/26/2015   CHF (congestive heart failure) (HCC)    Chronic systolic congestive heart failure (HCC) 12/06/2016   Claudication (HCC)    Congestive heart failure (CHF) (HCC)    Coronary artery disease    Coronary artery disease involving native coronary artery of native heart without angina pectoris 07/26/2015   Diabetes mellitus without complication (HCC)    Dilated cardiomyopathy (HCC) 09/03/2016   Overview:  Ejection fraction  20-25%   Dyslipidemia 07/26/2015   Dyspnea on exertion 10/26/2015   Essential hypertension 07/26/2015   GERD (gastroesophageal reflux disease)    Heart murmur    History of mechanical aortic valve replacement 03/29/2016   Overview:  May 2017. St Judes 23 mm  Formatting of this note might be different from the original. Overview:  May 2017. St Judes 23 mm Formatting of this note might be different from the original. May 2017. St Judes 23 mm   History of tobacco abuse 07/26/2015   Hyperlipidemia    Hypertension    Hypothyroidism 01/23/2016   Intermittent claudication (HCC) 07/26/2015   Morbid obesity (HCC) 12/06/2016   Nutritional and metabolic cardiomyopathy (HCC)    Paroxysmal atrial fibrillation (HCC) 03/06/2016   Paroxysmal atrial flutter (HCC) 09/01/2018   Peripheral vascular disease (HCC) 07/26/2015   Presence of automatic implantable cardioverter-defibrillator 10/23/2016   PVD (peripheral vascular disease) (HCC)    Rheumatic aortic valve insufficiency 03/05/2016   Sleep apnea    Stroke (HCC)    Type 2 diabetes mellitus with diabetic peripheral angiopathy without gangrene, without long-term current use of insulin (HCC) 11/22/2015    Past Surgical History:  Procedure Laterality Date   A-FLUTTER ABLATION N/A 11/05/2018   Procedure: A-FLUTTER ABLATION;  Surgeon: Regan Lemming, MD;  Location: [facility];  Service: Cardiovascular;  Laterality: N/A;   CARDIAC CATHETERIZATION     INSERT / REPLACE / REMOVE PACEMAKER     Medtronic ICD   MECHANICAL AORTIC VALVE REPLACEMENT     PERIPHERAL ARTERIAL  STENT GRAFT     TUBAL LIGATION      Current Medications: Current Meds  Medication Sig   albuterol (PROVENTIL HFA;VENTOLIN HFA) 108 (90 Base) MCG/ACT inhaler Inhale 1-2 puffs into the lungs every 6 (six) hours as needed for wheezing or shortness of breath.   ALPRAZolam (XANAX) 0.5 MG tablet Take 0.5 mg by mouth 3 (three) times daily as needed for anxiety.    amiodarone (PACERONE) 200 MG  tablet Take 200 mg by mouth daily after breakfast.    atorvastatin (LIPITOR) 80 MG tablet Take 80 mg by mouth daily.   carvedilol (COREG) 25 MG tablet TAKE 1 TABLET BY MOUTH TWICE A DAY (Patient taking differently: Take 25 mg by mouth 2 (two) times daily with a meal.)   cefdinir (OMNICEF) 300 MG capsule Take 300 mg by mouth 2 (two) times daily.   clopidogrel (PLAVIX) 75 MG tablet Take 75 mg by mouth daily after breakfast.    COMBIVENT RESPIMAT 20-100 MCG/ACT AERS respimat Inhale 1 puff into the lungs every 6 (six) hours as needed for wheezing or shortness of breath.   Cyanocobalamin (B-12) 500 MCG TABS Take 3 tablets by mouth daily.   ENTRESTO 49-51 MG TAKE 1 TABLET BY MOUTH 2 TIMES DAILY   EPIPEN 2-PAK 0.3 MG/0.3ML SOAJ injection Inject 0.3 mg into the muscle once.    ezetimibe (ZETIA) 10 MG tablet Take 10 mg by mouth daily.   fluticasone (FLONASE) 50 MCG/ACT nasal spray Place 2 sprays into both nostrils daily as needed for allergies.    Fluticasone-Umeclidin-Vilant (TRELEGY ELLIPTA IN) Inhale 1-2 puffs into the lungs daily.   furosemide (LASIX) 20 MG tablet TAKE 3 TABLETS BY MOUTH 2 TIMES DAILY. (Patient taking differently: Take 60 mg by mouth 2 (two) times daily.)   gabapentin (NEURONTIN) 300 MG capsule Take 300 mg by mouth 3 (three) times daily.    glimepiride (AMARYL) 4 MG tablet Take 4 mg by mouth daily with breakfast.    HYDROcodone-acetaminophen (NORCO) 10-325 MG tablet Take 1 tablet by mouth 4 (four) times daily as needed for moderate pain or severe pain.   ipratropium-albuterol (DUONEB) 0.5-2.5 (3) MG/3ML SOLN Take 3 mLs by nebulization 2 (two) times daily.    levothyroxine (SYNTHROID) 100 MCG tablet Take 100 mcg by mouth daily.   levothyroxine (SYNTHROID) 88 MCG tablet Take 88 mcg by mouth daily.   linaclotide (LINZESS) 72 MCG capsule Take 72 mcg by mouth daily as needed (constipation).   metFORMIN (GLUCOPHAGE) 500 MG tablet Take 500 mg by mouth 2 (two) times daily with a meal.     montelukast (SINGULAIR) 10 MG tablet Take 10 mg by mouth at bedtime.    Multiple Vitamins-Minerals (CENTRUM SILVER ULTRA WOMENS) TABS Take 1 tablet by mouth daily. Unknown strength   nystatin (MYCOSTATIN/NYSTOP) powder Apply 1 application topically 3 (three) times daily. Unknown strength   omeprazole (PRILOSEC) 20 MG capsule Take 20 mg by mouth at bedtime.    ondansetron (ZOFRAN-ODT) 4 MG disintegrating tablet Take 4 mg by mouth every 6 (six) hours.   Potassium Chloride ER 20 MEQ TBCR TAKE 1 TABLET BY MOUTH EVERY DAY (Patient taking differently: Take 1 tablet by mouth daily.)   promethazine (PHENERGAN) 25 MG suppository Place 25 mg rectally every 6 (six) hours as needed for vomiting or nausea.   Promethazine-Codeine 6.25-10 MG/5ML SOLN Take 5 mLs by mouth every 4 (four) hours as needed (N&V).   Red Yeast Rice 600 MG TABS Take 2 tablets by mouth daily.   rosuvastatin (  CRESTOR) 20 MG tablet TAKE 1 TABLET BY MOUTH EVERY DAY (Patient taking differently: Take 20 mg by mouth daily.)   warfarin (COUMADIN) 4 MG tablet Take 4 mg by mouth one time only at 4 PM. Take 1 tablet daily except on WED and SUN when you take 5 mgs   warfarin (COUMADIN) 5 MG tablet Take 5 mg by mouth See admin instructions. Take 6 mg on Sunday, Tuesday, and  Thursday . Take 5 mg on all other days     Allergies:   Iodinated contrast media and Ioxaglate   Social History   Socioeconomic History   Marital status: Single    Spouse name: Not on file   Number of children: Not on file   Years of education: Not on file   Highest education level: Not on file  Occupational History   Not on file  Tobacco Use   Smoking status: Former   Smokeless tobacco: Never  Vaping Use   Vaping Use: Never used  Substance and Sexual Activity   Alcohol use: No   Drug use: No   Sexual activity: Not on file  Other Topics Concern   Not on file  Social History Narrative   Not on file   Social Determinants of Health   Financial Resource Strain:  Not on file  Food Insecurity: Not on file  Transportation Needs: Not on file  Physical Activity: Not on file  Stress: Not on file  Social Connections: Not on file     Family History: The patient's family history includes Heart disease in her brother. ROS:   Please see the history of present illness.    All 14 point review of systems negative except as described per history of present illness  EKGs/Labs/Other Studies Reviewed:      Recent Labs: 06/19/2021: ALT 20; BUN 17; Creatinine, Ser 0.87; Potassium 4.3; Sodium 145  Recent Lipid Panel    Component Value Date/Time   CHOL 176 06/19/2021 1624   TRIG 108 06/19/2021 1624   HDL 48 06/19/2021 1624   CHOLHDL 3.7 06/19/2021 1624   CHOLHDL 4.3 07/04/2007 0815   VLDL 28 07/04/2007 0815   LDLCALC 108 (H) 06/19/2021 1624    Physical Exam:    VS:  BP 138/75 (BP Location: Left Arm, Patient Position: Sitting)    Pulse 75    Ht 5\' 11"  (1.803 m)    Wt (!) 346 lb (156.9 kg)    SpO2 95%    BMI 48.26 kg/m     Wt Readings from Last 3 Encounters:  12/19/21 (!) 346 lb (156.9 kg)  07/19/21 (!) 347 lb (157.4 kg)  06/19/21 (!) 347 lb 3.2 oz (157.5 kg)     GEN:  Well nourished, well developed in no acute distress HEENT: Normal NECK: No JVD; No carotid bruits LYMPHATICS: No lymphadenopathy CARDIAC: RRR, crisp mechanical valve sounds present no murmurs, no rubs, no gallops RESPIRATORY:  Clear to auscultation without rales, wheezing or rhonchi  ABDOMEN: Soft, non-tender, non-distended MUSCULOSKELETAL:  No edema; No deformity  SKIN: Warm and dry LOWER EXTREMITIES: no swelling NEUROLOGIC:  Alert and oriented x 3 PSYCHIATRIC:  Normal affect   ASSESSMENT:    1. Dilated cardiomyopathy (HCC)   2. History of tobacco abuse   3. Dyspnea on exertion   4. Chronic systolic congestive heart failure (HCC)   5. History of mechanical aortic valve replacement    PLAN:    In order of problems listed above:  Dilated cardiomyopathy on guideline  directed  medical therapy that she is able to tolerate.  Last echocardiogram showed ejection fraction 4045%.  We will continue present management. History of tobacco abuse does not use it anymore. Mechanical valve present in aortic position last echocardiogram showed normal function.  She is anticoagulant Coumadin which we will continue. Dyslipidemia I did review her K PN which show me data from 08/03/2021 with LDL at 91 HDL 48.  She is on high intense statin already.  80 mg of Lipitor which I will continue. Diabetes that being followed by internal team last hemoglobin A1c was 6.0 this is from 08/03/2021   Medication Adjustments/Labs and Tests Ordered: Current medicines are reviewed at length with the patient today.  Concerns regarding medicines are outlined above.  No orders of the defined types were placed in this encounter.  Medication changes: No orders of the defined types were placed in this encounter.   Signed, Georgeanna Lea, MD, [facility] 12/19/2021 1:56 PM    [facility]

## 2021-12-20 ENCOUNTER — Ambulatory Visit (INDEPENDENT_AMBULATORY_CARE_PROVIDER_SITE_OTHER): Payer: Medicare Other | Admitting: Sports Medicine

## 2021-12-20 ENCOUNTER — Encounter: Payer: Self-pay | Admitting: Sports Medicine

## 2021-12-20 DIAGNOSIS — E119 Type 2 diabetes mellitus without complications: Secondary | ICD-10-CM

## 2021-12-20 DIAGNOSIS — M79675 Pain in left toe(s): Secondary | ICD-10-CM | POA: Diagnosis not present

## 2021-12-20 DIAGNOSIS — B351 Tinea unguium: Secondary | ICD-10-CM

## 2021-12-20 DIAGNOSIS — M2040 Other hammer toe(s) (acquired), unspecified foot: Secondary | ICD-10-CM

## 2021-12-20 DIAGNOSIS — M79674 Pain in right toe(s): Secondary | ICD-10-CM | POA: Diagnosis not present

## 2021-12-20 DIAGNOSIS — M2142 Flat foot [pes planus] (acquired), left foot: Secondary | ICD-10-CM

## 2021-12-20 DIAGNOSIS — M2141 Flat foot [pes planus] (acquired), right foot: Secondary | ICD-10-CM

## 2021-12-20 NOTE — Progress Notes (Signed)
Subjective: Shannon Baxter is a 61 y.o. female patient with history of diabetes who presents to office today complaining of long, painful nails while ambulating in shoes; unable to trim. Patient states that the glucose reading this morning was 98 last A1c is 6 last visit to PCP Dr. Tomasa Blase was 1 week ago.  Denies any changes or other concerns at this time but does states she occasionally gets some sharp pain.   Patient Active Problem List   Diagnosis Date Noted   CHF (congestive heart failure) (HCC)    Stroke (HCC)    Sleep apnea    PVD (peripheral vascular disease) (HCC)    Nutritional and metabolic cardiomyopathy (HCC)    Hypertension    Hyperlipidemia    Heart murmur    GERD (gastroesophageal reflux disease)    Diabetes mellitus without complication (HCC)    Claudication (HCC)    Congestive heart failure (CHF) (HCC)    Asthma    Arthritis    Aortic stenosis    Aortic insufficiency    Allergy    Coronary artery disease    Paroxysmal atrial flutter (HCC) 09/01/2018   Chronic systolic congestive heart failure (HCC) 12/06/2016   Morbid obesity (HCC) 12/06/2016   Presence of automatic implantable cardioverter-defibrillator 10/23/2016   Dilated cardiomyopathy (HCC) 09/03/2016   History of mechanical aortic valve replacement 03/29/2016   Paroxysmal atrial fibrillation (HCC) 03/06/2016   Rheumatic aortic valve insufficiency 03/05/2016   Acute on chronic systolic heart failure (HCC) 02/21/2016   Hypothyroidism 01/23/2016   Type 2 diabetes mellitus with diabetic peripheral angiopathy without gangrene, without long-term current use of insulin (HCC) 11/22/2015   Dyspnea on exertion 10/26/2015   Atherosclerosis of native arteries of extremities with intermittent claudication, bilateral legs (HCC) 08/04/2015   Carotid atherosclerosis 07/26/2015   Coronary artery disease involving native coronary artery of native heart without angina pectoris 07/26/2015   Dyslipidemia 07/26/2015   Essential  hypertension 07/26/2015   Intermittent claudication (HCC) 07/26/2015   Peripheral vascular disease (HCC) 07/26/2015   History of tobacco abuse 07/26/2015   Current Outpatient Medications on File Prior to Visit  Medication Sig Dispense Refill   albuterol (PROVENTIL HFA;VENTOLIN HFA) 108 (90 Base) MCG/ACT inhaler Inhale 1-2 puffs into the lungs every 6 (six) hours as needed for wheezing or shortness of breath.     ALPRAZolam (XANAX) 0.5 MG tablet Take 0.5 mg by mouth 3 (three) times daily as needed for anxiety.   0   amiodarone (PACERONE) 200 MG tablet Take 200 mg by mouth daily after breakfast.   0   atorvastatin (LIPITOR) 80 MG tablet Take 80 mg by mouth daily.     carvedilol (COREG) 25 MG tablet TAKE 1 TABLET BY MOUTH TWICE A DAY (Patient taking differently: Take 25 mg by mouth 2 (two) times daily with a meal.) 180 tablet 3   cefdinir (OMNICEF) 300 MG capsule Take 300 mg by mouth 2 (two) times daily.     clopidogrel (PLAVIX) 75 MG tablet Take 75 mg by mouth daily after breakfast.   6   COMBIVENT RESPIMAT 20-100 MCG/ACT AERS respimat Inhale 1 puff into the lungs every 6 (six) hours as needed for wheezing or shortness of breath.     Cyanocobalamin (B-12) 500 MCG TABS Take 3 tablets by mouth daily.     ENTRESTO 49-51 MG TAKE 1 TABLET BY MOUTH 2 TIMES DAILY 180 tablet 3   EPIPEN 2-PAK 0.3 MG/0.3ML SOAJ injection Inject 0.3 mg into the muscle once.   0  ezetimibe (ZETIA) 10 MG tablet Take 10 mg by mouth daily.     fluticasone (FLONASE) 50 MCG/ACT nasal spray Place 2 sprays into both nostrils daily as needed for allergies.      Fluticasone-Umeclidin-Vilant (TRELEGY ELLIPTA IN) Inhale 1-2 puffs into the lungs daily.     furosemide (LASIX) 20 MG tablet TAKE 3 TABLETS BY MOUTH 2 TIMES DAILY. (Patient taking differently: Take 60 mg by mouth 2 (two) times daily.) 540 tablet 2   gabapentin (NEURONTIN) 300 MG capsule Take 300 mg by mouth 3 (three) times daily.   5   glimepiride (AMARYL) 4 MG tablet Take  4 mg by mouth daily with breakfast.      HYDROcodone-acetaminophen (NORCO) 10-325 MG tablet Take 1 tablet by mouth 4 (four) times daily as needed for moderate pain or severe pain.     ipratropium-albuterol (DUONEB) 0.5-2.5 (3) MG/3ML SOLN Take 3 mLs by nebulization 2 (two) times daily.      levothyroxine (SYNTHROID) 100 MCG tablet Take 100 mcg by mouth daily.     levothyroxine (SYNTHROID) 88 MCG tablet Take 88 mcg by mouth daily.     linaclotide (LINZESS) 72 MCG capsule Take 72 mcg by mouth daily as needed (constipation).     loratadine (CLARITIN) 10 MG tablet Take 10 mg by mouth at bedtime.      metFORMIN (GLUCOPHAGE) 500 MG tablet Take 500 mg by mouth 2 (two) times daily with a meal.      montelukast (SINGULAIR) 10 MG tablet Take 10 mg by mouth at bedtime.      Multiple Vitamins-Minerals (CENTRUM SILVER ULTRA WOMENS) TABS Take 1 tablet by mouth daily. Unknown strength     nystatin (MYCOSTATIN/NYSTOP) powder Apply 1 application topically 3 (three) times daily. Unknown strength     omeprazole (PRILOSEC) 20 MG capsule Take 20 mg by mouth at bedtime.      ondansetron (ZOFRAN-ODT) 4 MG disintegrating tablet Take 4 mg by mouth every 6 (six) hours.     Potassium Chloride ER 20 MEQ TBCR TAKE 1 TABLET BY MOUTH EVERY DAY (Patient taking differently: Take 1 tablet by mouth daily.) 90 tablet 2   promethazine (PHENERGAN) 25 MG suppository Place 25 mg rectally every 6 (six) hours as needed for vomiting or nausea.     Promethazine-Codeine 6.25-10 MG/5ML SOLN Take 5 mLs by mouth every 4 (four) hours as needed (N&V).     Red Yeast Rice 600 MG TABS Take 2 tablets by mouth daily.     rosuvastatin (CRESTOR) 20 MG tablet TAKE 1 TABLET BY MOUTH EVERY DAY (Patient taking differently: Take 20 mg by mouth daily.) 90 tablet 2   warfarin (COUMADIN) 4 MG tablet Take 4 mg by mouth one time only at 4 PM. Take 1 tablet daily except on WED and SUN when you take 5 mgs     warfarin (COUMADIN) 5 MG tablet Take 5 mg by mouth See  admin instructions. Take 6 mg on Sunday, Tuesday, and  Thursday . Take 5 mg on all other days     No current facility-administered medications on file prior to visit.   Allergies  Allergen Reactions   Iodinated Contrast Media Hives and Rash   Ioxaglate Hives    Recent Results (from the past 2160 hour(s))  CUP PACEART REMOTE DEVICE CHECK     Status: None   Collection Time: 11/28/21  1:22 AM  Result Value Ref Range   Date Time Interrogation Session [id number]    Pulse Generator Manufacturer MERM  Pulse Gen Model [id number] Evera MRI XT DR    Pulse Gen Serial Number [id number]    Clinic Name [facility]    Implantable Pulse Generator Type Implantable Cardiac Defibulator    Implantable Pulse Generator Implant Date [id number]    Implantable Lead Manufacturer MERM    Implantable Lead Model 5076 CapSureFix Novus MRI SureScan    Implantable Lead Serial Number [id number]    Implantable Lead Implant Date 95621308    Implantable Lead Location Detail 1 UNKNOWN    Implantable Lead Location [phone]    Implantable Lead Manufacturer [facility]    Implantable Lead Model [id number] Sprint Quattro Secure S MRI SureScan    Implantable Lead Serial Number [id number]    Implantable Lead Implant Date [id number]    Implantable Lead Location Detail 1 UNKNOWN    Implantable Lead Location [id number]    Lead Channel Setting Sensing Sensitivity 0.3 mV   Lead Channel Setting Pacing Amplitude 1.5 V   Lead Channel Setting Pacing Pulse Width 0.4 ms   Lead Channel Setting Pacing Amplitude 2.25 V   Lead Channel Impedance Value 456 ohm   Lead Channel Sensing Intrinsic Amplitude 3.875 mV   Lead Channel Sensing Intrinsic Amplitude 3.875 mV   Lead Channel Pacing Threshold Amplitude 0.375 V   Lead Channel Pacing Threshold Pulse Width 0.4 ms   Lead Channel Impedance Value 380 ohm   Lead Channel Impedance Value 342 ohm   Lead Channel Sensing Intrinsic Amplitude 24.125 mV   Lead Channel Sensing Intrinsic Amplitude 24.125 mV    Lead Channel Pacing Threshold Amplitude 1 V   Lead Channel Pacing Threshold Pulse Width 0.4 ms   HighPow Impedance 81 ohm   Battery Status OK    Battery Remaining Longevity 63 mo   Battery Voltage 2.98 V   Brady Statistic RA Percent Paced 0.95 %   Brady Statistic RV Percent Paced 0.06 %   Brady Statistic AP VP Percent 0.01 %   Brady Statistic AS VP Percent 0.05 %   Brady Statistic AP VS Percent 0.94 %   Brady Statistic AS VS Percent 99 %    Objective: General: Patient is awake, alert, and oriented x 3 and in no acute distress.  Integument: Skin is warm, dry and supple bilateral. Nails are tender, long, thickened and dystrophic with subungual debris, consistent with onychomycosis, 1-5 bilateral minimal ingrowing on the right great toe, no acute signs or symptoms of infection.   Remaining integument unremarkable.  Neurovascular status unchanged.   Musculoskeletal: Asymptomatic pes planus foot type and hammertoe deformity.  Occasional sharp pain at the distal arch extending to the first MPJ right greater than left.  Muscular strength 5/5 in all lower extremity muscular groups bilateral. No tenderness with calf compression bilateral.  Assessment and Plan: Problem List Items Addressed This Visit       Endocrine   Diabetes mellitus without complication (HCC) - Primary   Other Visit Diagnoses     Pain due to onychomycosis of toenails of both feet       Pes planus of both feet       Hammer toe, unspecified laterality          -Examined patient. -Re-Discussed and educated patient on diabetic foot care -Mechanically debrided all nails 1-5 bilateral using sterile nail nipper and filed with dremel without incident and removed any offending borders using sterile nail nipper without incident. -Added additional offloading padding plantar first metatarsal heads bilateral to her diabetic shoes to see if this will help  with any discomfort at her first MPJs/distal arch -Patient to return  in  10 to 12 weeks for at risk foot care like previous -Patient advised to call the office if any problems or questions arise in the meantime.  [name], DPM

## 2021-12-25 DIAGNOSIS — G4733 Obstructive sleep apnea (adult) (pediatric): Secondary | ICD-10-CM | POA: Diagnosis not present

## 2021-12-25 DIAGNOSIS — J301 Allergic rhinitis due to pollen: Secondary | ICD-10-CM | POA: Diagnosis not present

## 2021-12-25 DIAGNOSIS — R791 Abnormal coagulation profile: Secondary | ICD-10-CM | POA: Diagnosis not present

## 2021-12-25 DIAGNOSIS — J4540 Moderate persistent asthma, uncomplicated: Secondary | ICD-10-CM | POA: Diagnosis not present

## 2021-12-26 DIAGNOSIS — E1140 Type 2 diabetes mellitus with diabetic neuropathy, unspecified: Secondary | ICD-10-CM | POA: Diagnosis not present

## 2021-12-26 DIAGNOSIS — M5442 Lumbago with sciatica, left side: Secondary | ICD-10-CM | POA: Diagnosis not present

## 2022-01-08 DIAGNOSIS — R791 Abnormal coagulation profile: Secondary | ICD-10-CM | POA: Diagnosis not present

## 2022-01-15 DIAGNOSIS — R791 Abnormal coagulation profile: Secondary | ICD-10-CM | POA: Diagnosis not present

## 2022-01-15 DIAGNOSIS — G4733 Obstructive sleep apnea (adult) (pediatric): Secondary | ICD-10-CM | POA: Diagnosis not present

## 2022-01-29 DIAGNOSIS — R791 Abnormal coagulation profile: Secondary | ICD-10-CM | POA: Diagnosis not present

## 2022-02-13 DIAGNOSIS — J069 Acute upper respiratory infection, unspecified: Secondary | ICD-10-CM | POA: Diagnosis not present

## 2022-02-13 DIAGNOSIS — R791 Abnormal coagulation profile: Secondary | ICD-10-CM | POA: Diagnosis not present

## 2022-02-13 DIAGNOSIS — L0201 Cutaneous abscess of face: Secondary | ICD-10-CM | POA: Diagnosis not present

## 2022-02-20 DIAGNOSIS — G4733 Obstructive sleep apnea (adult) (pediatric): Secondary | ICD-10-CM | POA: Diagnosis not present

## 2022-02-20 DIAGNOSIS — R051 Acute cough: Secondary | ICD-10-CM | POA: Diagnosis not present

## 2022-02-20 DIAGNOSIS — Z03818 Encounter for observation for suspected exposure to other biological agents ruled out: Secondary | ICD-10-CM | POA: Diagnosis not present

## 2022-02-20 DIAGNOSIS — J301 Allergic rhinitis due to pollen: Secondary | ICD-10-CM | POA: Diagnosis not present

## 2022-02-20 DIAGNOSIS — J4540 Moderate persistent asthma, uncomplicated: Secondary | ICD-10-CM | POA: Diagnosis not present

## 2022-02-23 DIAGNOSIS — G4733 Obstructive sleep apnea (adult) (pediatric): Secondary | ICD-10-CM | POA: Diagnosis not present

## 2022-02-23 DIAGNOSIS — J301 Allergic rhinitis due to pollen: Secondary | ICD-10-CM | POA: Diagnosis not present

## 2022-02-23 DIAGNOSIS — J4541 Moderate persistent asthma with (acute) exacerbation: Secondary | ICD-10-CM | POA: Diagnosis not present

## 2022-02-26 DIAGNOSIS — R791 Abnormal coagulation profile: Secondary | ICD-10-CM | POA: Diagnosis not present

## 2022-02-27 ENCOUNTER — Ambulatory Visit (INDEPENDENT_AMBULATORY_CARE_PROVIDER_SITE_OTHER): Payer: Medicare Other

## 2022-02-27 DIAGNOSIS — I420 Dilated cardiomyopathy: Secondary | ICD-10-CM | POA: Diagnosis not present

## 2022-02-27 LAB — CUP PACEART REMOTE DEVICE CHECK
Battery Remaining Longevity: 59 mo
Battery Voltage: 2.97 V
Brady Statistic AP VP Percent: 0 %
Brady Statistic AP VS Percent: 1.24 %
Brady Statistic AS VP Percent: 0.04 %
Brady Statistic AS VS Percent: 98.71 %
Brady Statistic RA Percent Paced: 1.24 %
Brady Statistic RV Percent Paced: 0.05 %
Date Time Interrogation Session: 20230509022602
HighPow Impedance: 76 Ohm
Implantable Lead Implant Date: 20180102
Implantable Lead Implant Date: 20180102
Implantable Lead Location: 753859
Implantable Lead Location: 753860
Implantable Lead Model: 5076
Implantable Pulse Generator Implant Date: 20180102
Lead Channel Impedance Value: 323 Ohm
Lead Channel Impedance Value: 380 Ohm
Lead Channel Impedance Value: 437 Ohm
Lead Channel Pacing Threshold Amplitude: 0.375 V
Lead Channel Pacing Threshold Amplitude: 1.125 V
Lead Channel Pacing Threshold Pulse Width: 0.4 ms
Lead Channel Pacing Threshold Pulse Width: 0.4 ms
Lead Channel Sensing Intrinsic Amplitude: 24.875 mV
Lead Channel Sensing Intrinsic Amplitude: 24.875 mV
Lead Channel Sensing Intrinsic Amplitude: 4.25 mV
Lead Channel Sensing Intrinsic Amplitude: 4.25 mV
Lead Channel Setting Pacing Amplitude: 1.5 V
Lead Channel Setting Pacing Amplitude: 2.25 V
Lead Channel Setting Pacing Pulse Width: 0.4 ms
Lead Channel Setting Sensing Sensitivity: 0.3 mV

## 2022-02-28 ENCOUNTER — Ambulatory Visit (INDEPENDENT_AMBULATORY_CARE_PROVIDER_SITE_OTHER): Payer: Medicare Other | Admitting: Sports Medicine

## 2022-02-28 ENCOUNTER — Encounter: Payer: Self-pay | Admitting: Sports Medicine

## 2022-02-28 DIAGNOSIS — B351 Tinea unguium: Secondary | ICD-10-CM | POA: Diagnosis not present

## 2022-02-28 DIAGNOSIS — E119 Type 2 diabetes mellitus without complications: Secondary | ICD-10-CM

## 2022-02-28 DIAGNOSIS — M79674 Pain in right toe(s): Secondary | ICD-10-CM

## 2022-02-28 DIAGNOSIS — M79675 Pain in left toe(s): Secondary | ICD-10-CM

## 2022-02-28 DIAGNOSIS — M2040 Other hammer toe(s) (acquired), unspecified foot: Secondary | ICD-10-CM

## 2022-02-28 DIAGNOSIS — M2141 Flat foot [pes planus] (acquired), right foot: Secondary | ICD-10-CM

## 2022-02-28 DIAGNOSIS — M2142 Flat foot [pes planus] (acquired), left foot: Secondary | ICD-10-CM

## 2022-02-28 NOTE — Progress Notes (Signed)
Subjective: ?Shannon Baxter is a 61 y.o. female patient with history of diabetes who presents to office today complaining of long, painful nails while ambulating in shoes; unable to trim. Patient states that the glucose reading this morning was not recorded but she does suspect that it was elevated because she has been sick and on steroids, also reports that she had [place] and experienced some swelling in the legs that is now getting better has doubled up on her Lasix, last visit to PCP Dr. Delena Bali was 1 week ago. No other issues noted ? ?Patient Active Problem List  ? Diagnosis Date Noted  ? CHF (congestive heart failure) ([place])   ? Stroke [facility])   ? Sleep apnea   ? PVD (peripheral vascular disease) ([place])   ? Nutritional and metabolic cardiomyopathy ([place])   ? Hypertension   ? Hyperlipidemia   ? Heart murmur   ? GERD (gastroesophageal reflux disease)   ? Diabetes mellitus without complication ([place])   ? Claudication [facility])   ? Congestive heart failure (CHF) ([place])   ? Asthma   ? Arthritis   ? Aortic stenosis   ? Aortic insufficiency   ? Allergy   ? Coronary artery disease   ? Paroxysmal atrial flutter ([place]) 09/01/2018  ? Chronic systolic congestive heart failure ([place]) 12/06/2016  ? Morbid obesity ([place]) 12/06/2016  ? Presence of automatic implantable cardioverter-defibrillator 10/23/2016  ? Dilated cardiomyopathy ([place]) 09/03/2016  ? History of mechanical aortic valve replacement 03/29/2016  ? Paroxysmal atrial fibrillation ([place]) 03/06/2016  ? Rheumatic aortic valve insufficiency 03/05/2016  ? Acute on chronic systolic heart failure ([place]) 02/21/2016  ? Hypothyroidism 01/23/2016  ? Type 2 diabetes mellitus with diabetic peripheral angiopathy without gangrene, without long-term current use of insulin ([place]) 11/22/2015  ? Dyspnea on exertion 10/26/2015  ? Atherosclerosis of native arteries of extremities with intermittent claudication, bilateral legs ([place]) 08/04/2015  ? Carotid atherosclerosis 07/26/2015  ? Coronary artery  disease involving native coronary artery of native heart without angina pectoris 07/26/2015  ? Dyslipidemia 07/26/2015  ? Essential hypertension 07/26/2015  ? Intermittent claudication ([place]) 07/26/2015  ? Peripheral vascular disease ([place]) 07/26/2015  ? History of tobacco abuse 07/26/2015  ? ?Current Outpatient Medications on File Prior to Visit  ?Medication Sig Dispense Refill  ? albuterol (PROVENTIL HFA;VENTOLIN HFA) 108 (90 Base) MCG/ACT inhaler Inhale 1-2 puffs into the lungs every 6 (six) hours as needed for wheezing or shortness of breath.    ? ALPRAZolam (XANAX) 0.5 MG tablet Take 0.5 mg by mouth 3 (three) times daily as needed for anxiety.   0  ? amiodarone (PACERONE) 200 MG tablet Take 200 mg by mouth daily after breakfast.   0  ? atorvastatin (LIPITOR) 80 MG tablet Take 80 mg by mouth daily.    ? carvedilol (COREG) 25 MG tablet TAKE 1 TABLET BY MOUTH TWICE A DAY (Patient taking differently: Take 25 mg by mouth 2 (two) times daily with a meal.) 180 tablet 3  ? cefdinir (OMNICEF) 300 MG capsule Take 300 mg by mouth 2 (two) times daily.    ? clopidogrel (PLAVIX) 75 MG tablet Take 75 mg by mouth daily after breakfast.   6  ? COMBIVENT RESPIMAT 20-100 MCG/ACT AERS respimat Inhale 1 puff into the lungs every 6 (six) hours as needed for wheezing or shortness of breath.    ? Cyanocobalamin (B-12) 500 MCG TABS Take 3 tablets by mouth daily.    ? ENTRESTO 49-51 MG TAKE 1 TABLET BY MOUTH 2 TIMES DAILY  180 tablet 3  ? EPIPEN 2-PAK 0.3 MG/0.3ML SOAJ injection Inject 0.3 mg into the muscle once.   0  ? ezetimibe (ZETIA) 10 MG tablet Take 10 mg by mouth daily.    ? fluticasone (FLONASE) 50 MCG/ACT nasal spray Place 2 sprays into both nostrils daily as needed for allergies.     ? Fluticasone-Umeclidin-Vilant (TRELEGY ELLIPTA IN) Inhale 1-2 puffs into the lungs daily.    ? furosemide (LASIX) 20 MG tablet TAKE 3 TABLETS BY MOUTH 2 TIMES DAILY. (Patient taking differently: Take 60 mg by mouth 2 (two) times daily.) 540 tablet  2  ? gabapentin (NEURONTIN) 300 MG capsule Take 300 mg by mouth 3 (three) times daily.   5  ? glimepiride (AMARYL) 4 MG tablet Take 4 mg by mouth daily with breakfast.     ? HYDROcodone-acetaminophen (NORCO) 10-325 MG tablet Take 1 tablet by mouth 4 (four) times daily as needed for moderate pain or severe pain.    ? ipratropium-albuterol (DUONEB) 0.5-2.5 (3) MG/3ML SOLN Take 3 mLs by nebulization 2 (two) times daily.     ? levothyroxine (SYNTHROID) 100 MCG tablet Take 100 mcg by mouth daily.    ? levothyroxine (SYNTHROID) 88 MCG tablet Take 88 mcg by mouth daily.    ? linaclotide (LINZESS) 72 MCG capsule Take 72 mcg by mouth daily as needed (constipation).    ? loratadine (CLARITIN) 10 MG tablet Take 10 mg by mouth at bedtime.     ? metFORMIN (GLUCOPHAGE) 500 MG tablet Take 500 mg by mouth 2 (two) times daily with a meal.     ? montelukast (SINGULAIR) 10 MG tablet Take 10 mg by mouth at bedtime.     ? Multiple Vitamins-Minerals (CENTRUM SILVER ULTRA WOMENS) TABS Take 1 tablet by mouth daily. Unknown strength    ? nystatin (MYCOSTATIN/NYSTOP) powder Apply 1 application topically 3 (three) times daily. Unknown strength    ? omeprazole (PRILOSEC) 20 MG capsule Take 20 mg by mouth at bedtime.     ? ondansetron (ZOFRAN-ODT) 4 MG disintegrating tablet Take 4 mg by mouth every 6 (six) hours.    ? Potassium Chloride ER 20 MEQ TBCR TAKE 1 TABLET BY MOUTH EVERY DAY (Patient taking differently: Take 1 tablet by mouth daily.) 90 tablet 2  ? promethazine (PHENERGAN) 25 MG suppository Place 25 mg rectally every 6 (six) hours as needed for vomiting or nausea.    ? Promethazine-Codeine 6.25-10 MG/5ML SOLN Take 5 mLs by mouth every 4 (four) hours as needed (N&V).    ? Red Yeast Rice 600 MG TABS Take 2 tablets by mouth daily.    ? rosuvastatin (CRESTOR) 20 MG tablet TAKE 1 TABLET BY MOUTH EVERY DAY (Patient taking differently: Take 20 mg by mouth daily.) 90 tablet 2  ? warfarin (COUMADIN) 4 MG tablet Take 4 mg by mouth one time only  at 4 PM. Take 1 tablet daily except on WED and SUN when you take 5 mgs    ? warfarin (COUMADIN) 5 MG tablet Take 5 mg by mouth See admin instructions. Take 6 mg on Sunday, Tuesday, and  Thursday . Take 5 mg on all other days    ? ?No current facility-administered medications on file prior to visit.  ? ?Allergies  ?Allergen Reactions  ? Iodinated Contrast Media Hives and Rash  ? Ioxaglate Hives  ? ? ?Recent Results (from the past 2160 hour(s))  ?CUP PACEART REMOTE DEVICE CHECK     Status: None  ? Collection Time: 02/27/22  2:26 AM  ?Result Value Ref Range  ? Date Time Interrogation Session [phone]   ? Pulse Insurance claims handler MERM   ? Pulse Gen Model [id number] Evera MRI XT DR   ? Pulse Gen Serial Number [id number]   ? Clinic Name [facility]   ? Implantable Pulse Generator Type Implantable Cardiac Defibulator   ? Implantable Pulse Generator Implant Date [id number]   ? Implantable Lead Manufacturer MERM   ? Implantable Lead Model 5076 CapSureFix Novus MRI SureScan   ? Implantable Lead Serial Number [id number]   ? Implantable Lead Implant Date [id number]   ? Implantable Lead Location Detail 1 UNKNOWN   ? Implantable Lead Location [id number]   ? Implantable Lead Manufacturer MERM   ? Implantable Lead Model [id number] Sprint Quattro Secure S MRI SureScan   ? Implantable Lead Serial Number [id number]   ? Implantable Lead Implant Date [id number]   ? Implantable Lead Location Detail 1 UNKNOWN   ? Implantable Lead Location [id number]   ? Lead Channel Setting Sensing Sensitivity 0.3 mV  ? Lead Channel Setting Pacing Amplitude 1.5 V  ? Lead Channel Setting Pacing Pulse Width 0.4 ms  ? Lead Channel Setting Pacing Amplitude 2.25 V  ? Lead Channel Impedance Value 437 ohm  ? Lead Channel Sensing Intrinsic Amplitude 4.25 mV  ? Lead Channel Sensing Intrinsic Amplitude 4.25 mV  ? Lead Channel Pacing Threshold Amplitude 0.375 V  ? Lead Channel Pacing Threshold Pulse Width 0.4 ms  ? Lead Channel Impedance Value 380 ohm  ? Lead Channel  Impedance Value 323 ohm  ? Lead Channel Sensing Intrinsic Amplitude 24.875 mV  ? Lead Channel Sensing Intrinsic Amplitude 24.875 mV  ? Lead Channel Pacing Threshold Amplitude 1.125 V  ? Lead Channel Pacing Thresho

## 2022-03-05 DIAGNOSIS — R791 Abnormal coagulation profile: Secondary | ICD-10-CM | POA: Diagnosis not present

## 2022-03-05 DIAGNOSIS — I5020 Unspecified systolic (congestive) heart failure: Secondary | ICD-10-CM | POA: Diagnosis not present

## 2022-03-13 NOTE — Progress Notes (Signed)
Remote ICD transmission.   

## 2022-03-14 ENCOUNTER — Other Ambulatory Visit: Payer: Self-pay

## 2022-03-16 ENCOUNTER — Other Ambulatory Visit: Payer: Self-pay | Admitting: Cardiology

## 2022-03-20 ENCOUNTER — Telehealth: Payer: Self-pay

## 2022-03-20 NOTE — Telephone Encounter (Signed)
Patient left a voicemail with our office this morning asking for a call to schedule an appointment with Dr. Delena Bali.

## 2022-03-20 NOTE — Telephone Encounter (Signed)
Pt has been scheduled for 03/28/2022 at 300 with Dr Delena Bali.

## 2022-03-21 ENCOUNTER — Other Ambulatory Visit: Payer: Self-pay | Admitting: Cardiology

## 2022-03-21 ENCOUNTER — Encounter: Payer: Self-pay | Admitting: Cardiology

## 2022-03-21 ENCOUNTER — Ambulatory Visit (INDEPENDENT_AMBULATORY_CARE_PROVIDER_SITE_OTHER): Payer: Medicare Other | Admitting: Cardiology

## 2022-03-21 VITALS — BP 148/76 | HR 71 | Ht 72.0 in | Wt 346.6 lb

## 2022-03-21 DIAGNOSIS — E1151 Type 2 diabetes mellitus with diabetic peripheral angiopathy without gangrene: Secondary | ICD-10-CM

## 2022-03-21 DIAGNOSIS — R5383 Other fatigue: Secondary | ICD-10-CM | POA: Diagnosis not present

## 2022-03-21 DIAGNOSIS — R0609 Other forms of dyspnea: Secondary | ICD-10-CM

## 2022-03-21 DIAGNOSIS — I4892 Unspecified atrial flutter: Secondary | ICD-10-CM

## 2022-03-21 DIAGNOSIS — Z95810 Presence of automatic (implantable) cardiac defibrillator: Secondary | ICD-10-CM

## 2022-03-21 DIAGNOSIS — I420 Dilated cardiomyopathy: Secondary | ICD-10-CM | POA: Diagnosis not present

## 2022-03-21 DIAGNOSIS — I5022 Chronic systolic (congestive) heart failure: Secondary | ICD-10-CM | POA: Diagnosis not present

## 2022-03-21 DIAGNOSIS — R791 Abnormal coagulation profile: Secondary | ICD-10-CM | POA: Diagnosis not present

## 2022-03-21 DIAGNOSIS — I10 Essential (primary) hypertension: Secondary | ICD-10-CM | POA: Diagnosis not present

## 2022-03-21 MED ORDER — AMIODARONE HCL 200 MG PO TABS: 100.0000 mg | ORAL_TABLET | Freq: Every day | ORAL | 3 refills | Status: DC

## 2022-03-21 MED ORDER — AMIODARONE HCL 100 MG PO TABS: 100.0000 mg | ORAL_TABLET | Freq: Every day | ORAL | 3 refills | Status: DC

## 2022-03-21 NOTE — Patient Instructions (Signed)
Medication Instructions:  Your physician has recommended you make the following change in your medication: Decrease Amiodarone to 100mg  every day by mouth    Lab Work: BMP, ProBNP, CBC, TSH Today If you have labs (blood work) drawn today and your tests are completely normal, you will receive your results only by: MyChart Message (if you have MyChart) OR A paper copy in the mail If you have any lab test that is abnormal or we need to change your treatment, we will call you to review the results.   Testing/Procedures: None Ordered   Follow-Up: At [facility], you and your health needs are our priority.  As part of our continuing mission to provide you with exceptional heart care, we have created designated Provider Care Teams.  These Care Teams include your primary Cardiologist (physician) and Advanced Practice Providers (APPs -  Physician Assistants and Nurse Practitioners) who all work together to provide you with the care you need, when you need it.  We recommend signing up for the patient portal called "MyChart".  Sign up information is provided on this After Visit Summary.  MyChart is used to connect with patients for Virtual Visits (Telemedicine).  Patients are able to view lab/test results, encounter notes, upcoming appointments, etc.  Non-urgent messages can be sent to your provider as well.   To learn more about what you can do with MyChart, go to [facility].    Your next appointment:   1 month(s)  The format for your next appointment:   In Person  Provider:   [email or web address], MD    Other Instructions NA

## 2022-03-21 NOTE — Progress Notes (Unsigned)
Cardiology Office Note:    Date:  03/21/2022   ID:  Shannon Baxter, DOB 1961/07/17, MRN [id number]  PCP:  Paulina Fusi, MD  Cardiologist:  Gypsy Balsam, MD    Referring MD: Paulina Fusi, MD   No chief complaint on file.   History of Present Illness:    Shannon Baxter is a 61 y.o. female  past medical history significant for aortic valve replacement with mechanical Saint Jude 23 mm prosthesis that was done [facility], paroxysmal atrial fibrillation/flutter, cardiomyopathy with ejection fraction 40 to 45% assess on echocardiogram from November 2021, essential hypertension, diabetes, dyslipidemia. She is coming today Maisie Fus for follow-up she is not doing well she complain of being tired exhausted short of breath also some swelling of lower extremities.  Interestingly her weight is unchanged.  Denies having a palpitations, no chest pain tightness squeezing pressure burning chest.  Past Medical History:  Diagnosis Date   Acute on chronic systolic heart failure (HCC) 02/21/2016   Allergy    Aortic insufficiency    Aortic stenosis    Arthritis    Asthma    Atherosclerosis of native arteries of extremities with intermittent claudication, bilateral legs (HCC) 08/04/2015   Carotid atherosclerosis 07/26/2015   CHF (congestive heart failure) (HCC)    Chronic systolic congestive heart failure (HCC) 12/06/2016   Claudication (HCC)    Congestive heart failure (CHF) (HCC)    Coronary artery disease    Coronary artery disease involving native coronary artery of native heart without angina pectoris 07/26/2015   Diabetes mellitus without complication (HCC)    Dilated cardiomyopathy (HCC) 09/03/2016   Overview:  Ejection fraction 20-25%   Dyslipidemia 07/26/2015   Dyspnea on exertion 10/26/2015   Essential hypertension 07/26/2015   GERD (gastroesophageal reflux disease)    Heart murmur    History of mechanical aortic valve replacement 03/29/2016   Overview:  May 2017. St Judes 23 mm   Formatting of this note might be different from the original. Overview:  May 2017. St Judes 23 mm Formatting of this note might be different from the original. May 2017. St Judes 23 mm   History of tobacco abuse 07/26/2015   Hyperlipidemia    Hypertension    Hypothyroidism 01/23/2016   Intermittent claudication (HCC) 07/26/2015   Morbid obesity (HCC) 12/06/2016   Nutritional and metabolic cardiomyopathy (HCC)    Paroxysmal atrial fibrillation (HCC) 03/06/2016   Paroxysmal atrial flutter (HCC) 09/01/2018   Peripheral vascular disease (HCC) 07/26/2015   Presence of automatic implantable cardioverter-defibrillator 10/23/2016   PVD (peripheral vascular disease) (HCC)    Rheumatic aortic valve insufficiency 03/05/2016   Sleep apnea    Stroke (HCC)    Type 2 diabetes mellitus with diabetic peripheral angiopathy without gangrene, without long-term current use of insulin (HCC) 11/22/2015    Past Surgical History:  Procedure Laterality Date   A-FLUTTER ABLATION N/A 11/05/2018   Procedure: A-FLUTTER ABLATION;  Surgeon: Regan Lemming, MD;  Location: [facility] INVASIVE CV LAB;  Service: Cardiovascular;  Laterality: N/A;   CARDIAC CATHETERIZATION     INSERT / REPLACE / REMOVE PACEMAKER     Medtronic ICD   MECHANICAL AORTIC VALVE REPLACEMENT     PERIPHERAL ARTERIAL STENT GRAFT     TUBAL LIGATION      Current Medications: Current Meds  Medication Sig   albuterol (PROVENTIL HFA;VENTOLIN HFA) 108 (90 Base) MCG/ACT inhaler Inhale 1-2 puffs into the lungs every 6 (six) hours as needed for wheezing or shortness of breath.  ALPRAZolam (XANAX) 0.5 MG tablet Take 0.5 mg by mouth 3 (three) times daily as needed for anxiety.    amiodarone (PACERONE) 200 MG tablet Take 200 mg by mouth daily after breakfast.    atorvastatin (LIPITOR) 80 MG tablet Take 80 mg by mouth daily.   carvedilol (COREG) 25 MG tablet TAKE 1 TABLET BY MOUTH TWICE A DAY   clopidogrel (PLAVIX) 75 MG tablet Take 75 mg by mouth daily  after breakfast.    COMBIVENT RESPIMAT 20-100 MCG/ACT AERS respimat Inhale 1 puff into the lungs every 6 (six) hours as needed for wheezing or shortness of breath.   Cyanocobalamin (B-12) 500 MCG TABS Take 3 tablets by mouth daily.   ENTRESTO 49-51 MG TAKE 1 TABLET BY MOUTH 2 TIMES DAILY   EPIPEN 2-PAK 0.3 MG/0.3ML SOAJ injection Inject 0.3 mg into the muscle once.    ezetimibe (ZETIA) 10 MG tablet Take 10 mg by mouth daily.   fluticasone (FLONASE) 50 MCG/ACT nasal spray Place 2 sprays into both nostrils daily as needed for allergies.    furosemide (LASIX) 20 MG tablet TAKE 3 TABLETS BY MOUTH 2 TIMES DAILY.   gabapentin (NEURONTIN) 300 MG capsule Take 300 mg by mouth 3 (three) times daily.    glimepiride (AMARYL) 4 MG tablet Take 4 mg by mouth daily with breakfast.    HYDROcodone-acetaminophen (NORCO) 10-325 MG tablet Take 1 tablet by mouth 4 (four) times daily as needed for moderate pain or severe pain.   ipratropium-albuterol (DUONEB) 0.5-2.5 (3) MG/3ML SOLN Take 3 mLs by nebulization 2 (two) times daily.    levothyroxine (SYNTHROID) 88 MCG tablet Take 88 mcg by mouth daily.   linaclotide (LINZESS) 72 MCG capsule Take 72 mcg by mouth daily as needed (constipation).   loratadine (CLARITIN) 10 MG tablet Take 10 mg by mouth at bedtime.    metFORMIN (GLUCOPHAGE) 500 MG tablet Take 500 mg by mouth 2 (two) times daily with a meal.    montelukast (SINGULAIR) 10 MG tablet Take 10 mg by mouth at bedtime.    Multiple Vitamins-Minerals (CENTRUM SILVER ULTRA WOMENS) TABS Take 1 tablet by mouth daily. Unknown strength   nystatin (MYCOSTATIN/NYSTOP) powder Apply 1 application topically 3 (three) times daily. Unknown strength   omeprazole (PRILOSEC) 20 MG capsule Take 20 mg by mouth at bedtime.    ondansetron (ZOFRAN-ODT) 4 MG disintegrating tablet Take 4 mg by mouth every 6 (six) hours.   Potassium Chloride ER 20 MEQ TBCR TAKE 1 TABLET BY MOUTH EVERY DAY (Patient taking differently: Take 1 tablet by mouth  daily.)   Red Yeast Rice 600 MG TABS Take 2 tablets by mouth daily.   rosuvastatin (CRESTOR) 20 MG tablet TAKE 1 TABLET BY MOUTH EVERY DAY   TRELEGY ELLIPTA 100-62.5-25 MCG/ACT AEPB Inhale 1 puff into the lungs daily.   warfarin (COUMADIN) 4 MG tablet Take 4 mg by mouth daily.   warfarin (COUMADIN) 5 MG tablet Take 5 mg by mouth See admin instructions. Take 6 mg on Sunday, Tuesday, and  Thursday . Take 5 mg on all other days     Allergies:   Iodinated contrast media and Ioxaglate   Social History   Socioeconomic History   Marital status: Single    Spouse name: Not on file   Number of children: Not on file   Years of education: Not on file   Highest education level: Not on file  Occupational History   Not on file  Tobacco Use   Smoking status: Former  Smokeless tobacco: Never  Vaping Use   Vaping Use: Never used  Substance and Sexual Activity   Alcohol use: No   Drug use: No   Sexual activity: Not on file  Other Topics Concern   Not on file  Social History Narrative   Not on file   Social Determinants of Health   Financial Resource Strain: Not on file  Food Insecurity: Not on file  Transportation Needs: Not on file  Physical Activity: Not on file  Stress: Not on file  Social Connections: Not on file     Family History: The patient's family history includes Heart disease in her brother. ROS:   Please see the history of present illness.    All 14 point review of systems negative except as described per history of present illness  EKGs/Labs/Other Studies Reviewed:      Recent Labs: 06/19/2021: ALT 20; BUN 17; Creatinine, Ser 0.87; Potassium 4.3; Sodium 145  Recent Lipid Panel    Component Value Date/Time   CHOL 176 06/19/2021 1624   TRIG 108 06/19/2021 1624   HDL 48 06/19/2021 1624   CHOLHDL 3.7 06/19/2021 1624   CHOLHDL 4.3 07/04/2007 0815   VLDL 28 07/04/2007 0815   LDLCALC 108 (H) 06/19/2021 1624    Physical Exam:    VS:  BP (!) 148/76   Pulse 71    Ht 6' (1.829 m)   Wt (!) 346 lb 9.6 oz (157.2 kg)   SpO2 95%   BMI 47.01 kg/m     Wt Readings from Last 3 Encounters:  03/21/22 (!) 346 lb 9.6 oz (157.2 kg)  12/19/21 (!) 346 lb (156.9 kg)  07/19/21 (!) 347 lb (157.4 kg)     GEN:  Well nourished, well developed in no acute distress HEENT: Normal NECK: No JVD; No carotid bruits LYMPHATICS: No lymphadenopathy CARDIAC: RRR, crisp mechanical valve sounds, no murmurs, no rubs, no gallops RESPIRATORY:  Clear to auscultation without rales, wheezing or rhonchi  ABDOMEN: Soft, non-tender, non-distended MUSCULOSKELETAL:  No edema; No deformity  SKIN: Warm and dry LOWER EXTREMITIES: Minimal swelling NEUROLOGIC:  Alert and oriented x 3 PSYCHIATRIC:  Normal affect   ASSESSMENT:    No diagnosis found. PLAN:    In order of problems listed above:  Cardiomyopathy with ejection fraction 4045%, she looks a little mildly decompensated today I will check proBNP and Chem-7 to see if I can increase diuretics.  Concern this is also her shortness of breath and are more about potentially having problem with amiodarone.  I will check proBNP and Chem-7-no explanation for shortness of breath there will probably have to discontinue amiodarone or at least cut it down. Pacemaker present, there is a Medtronic device I did review fluid status which looks very compensated.  She did have few episodes of supraventricular ectopy/atrial fibrillation but it was a very short.  I will cut down amiodarone to only 100 mg daily. Status post aortic valve replacement, last echocardiogram showed normally prosthetic valve   Medication Adjustments/Labs and Tests Ordered: Current medicines are reviewed at length with the patient today.  Concerns regarding medicines are outlined above.  No orders of the defined types were placed in this encounter.  Medication changes: No orders of the defined types were placed in this encounter.   Signed, Park Liter, MD,  [facility] 03/21/2022 1:35 PM    [facility]

## 2022-03-22 LAB — CBC
Hematocrit: 39.7 % (ref 34.0–46.6)
Hemoglobin: 12.6 g/dL (ref 11.1–15.9)
MCH: 27.8 pg (ref 26.6–33.0)
MCHC: 31.7 g/dL (ref 31.5–35.7)
MCV: 88 fL (ref 79–97)
Platelets: 220 10*3/uL (ref 150–450)
RBC: 4.53 x10E6/uL (ref 3.77–5.28)
RDW: 15.2 % (ref 11.7–15.4)
WBC: 5 10*3/uL (ref 3.4–10.8)

## 2022-03-22 LAB — TSH: TSH: 3.6 u[IU]/mL (ref 0.450–4.500)

## 2022-03-22 LAB — BASIC METABOLIC PANEL
BUN/Creatinine Ratio: 15 (ref 9–23)
BUN: 13 mg/dL (ref 6–24)
CO2: 24 mmol/L (ref 20–29)
Calcium: 8.8 mg/dL (ref 8.7–10.2)
Chloride: 107 mmol/L — ABNORMAL HIGH (ref 96–106)
Creatinine, Ser: 0.87 mg/dL (ref 0.57–1.00)
Glucose: 63 mg/dL — ABNORMAL LOW (ref 70–99)
Potassium: 3.8 mmol/L (ref 3.5–5.2)
Sodium: 144 mmol/L (ref 134–144)
eGFR: 77 mL/min/{1.73_m2} (ref >59)

## 2022-03-22 LAB — PRO B NATRIURETIC PEPTIDE: NT-Pro BNP: 652 pg/mL — ABNORMAL HIGH (ref 0–287)

## 2022-03-23 ENCOUNTER — Telehealth: Payer: Self-pay

## 2022-03-23 DIAGNOSIS — I5022 Chronic systolic (congestive) heart failure: Secondary | ICD-10-CM

## 2022-03-23 DIAGNOSIS — I10 Essential (primary) hypertension: Secondary | ICD-10-CM

## 2022-03-23 NOTE — Telephone Encounter (Signed)
Results reviewed with pt as per Dr. Vanetta Shawl note. Advised to take Lasix 80mg  in the AM and 40mg  in the afternoon. Pt verbalized understanding and had no additional questions. Lab req sent to the lab. Routed to PCP

## 2022-03-28 ENCOUNTER — Ambulatory Visit: Payer: Medicare Other | Admitting: Psychiatry

## 2022-03-29 DIAGNOSIS — I10 Essential (primary) hypertension: Secondary | ICD-10-CM | POA: Diagnosis not present

## 2022-03-29 DIAGNOSIS — R791 Abnormal coagulation profile: Secondary | ICD-10-CM | POA: Diagnosis not present

## 2022-03-29 DIAGNOSIS — I5022 Chronic systolic (congestive) heart failure: Secondary | ICD-10-CM | POA: Diagnosis not present

## 2022-03-30 LAB — BASIC METABOLIC PANEL
BUN/Creatinine Ratio: 13 (ref 12–28)
BUN: 12 mg/dL (ref 8–27)
CO2: 28 mmol/L (ref 20–29)
Calcium: 9.1 mg/dL (ref 8.7–10.3)
Chloride: 105 mmol/L (ref 96–106)
Creatinine, Ser: 0.9 mg/dL (ref 0.57–1.00)
Glucose: 136 mg/dL — ABNORMAL HIGH (ref 70–99)
Potassium: 4 mmol/L (ref 3.5–5.2)
Sodium: 145 mmol/L — ABNORMAL HIGH (ref 134–144)
eGFR: 73 mL/min/{1.73_m2} (ref >59)

## 2022-04-02 DIAGNOSIS — R791 Abnormal coagulation profile: Secondary | ICD-10-CM | POA: Diagnosis not present

## 2022-04-05 DIAGNOSIS — G4733 Obstructive sleep apnea (adult) (pediatric): Secondary | ICD-10-CM | POA: Diagnosis not present

## 2022-04-05 DIAGNOSIS — J4540 Moderate persistent asthma, uncomplicated: Secondary | ICD-10-CM | POA: Diagnosis not present

## 2022-04-05 DIAGNOSIS — J301 Allergic rhinitis due to pollen: Secondary | ICD-10-CM | POA: Diagnosis not present

## 2022-04-09 DIAGNOSIS — R791 Abnormal coagulation profile: Secondary | ICD-10-CM | POA: Diagnosis not present

## 2022-04-10 DIAGNOSIS — G4733 Obstructive sleep apnea (adult) (pediatric): Secondary | ICD-10-CM | POA: Diagnosis not present

## 2022-04-13 DIAGNOSIS — I4891 Unspecified atrial fibrillation: Secondary | ICD-10-CM | POA: Diagnosis not present

## 2022-04-13 DIAGNOSIS — R0602 Shortness of breath: Secondary | ICD-10-CM | POA: Diagnosis not present

## 2022-04-13 DIAGNOSIS — R0789 Other chest pain: Secondary | ICD-10-CM | POA: Diagnosis not present

## 2022-04-14 ENCOUNTER — Other Ambulatory Visit: Payer: Self-pay | Admitting: Cardiology

## 2022-04-16 DIAGNOSIS — R791 Abnormal coagulation profile: Secondary | ICD-10-CM | POA: Diagnosis not present

## 2022-04-16 DIAGNOSIS — I110 Hypertensive heart disease with heart failure: Secondary | ICD-10-CM | POA: Diagnosis not present

## 2022-04-20 ENCOUNTER — Encounter: Payer: Self-pay | Admitting: Cardiology

## 2022-04-20 ENCOUNTER — Telehealth (HOSPITAL_COMMUNITY): Payer: Self-pay

## 2022-04-20 ENCOUNTER — Ambulatory Visit (INDEPENDENT_AMBULATORY_CARE_PROVIDER_SITE_OTHER): Payer: Medicare Other | Admitting: Cardiology

## 2022-04-20 VITALS — BP 130/79 | HR 73 | Ht 72.0 in | Wt 340.0 lb

## 2022-04-20 DIAGNOSIS — I10 Essential (primary) hypertension: Secondary | ICD-10-CM | POA: Diagnosis not present

## 2022-04-20 DIAGNOSIS — I5032 Chronic diastolic (congestive) heart failure: Secondary | ICD-10-CM | POA: Diagnosis not present

## 2022-04-20 DIAGNOSIS — Z952 Presence of prosthetic heart valve: Secondary | ICD-10-CM

## 2022-04-20 DIAGNOSIS — E119 Type 2 diabetes mellitus without complications: Secondary | ICD-10-CM

## 2022-04-20 DIAGNOSIS — E785 Hyperlipidemia, unspecified: Secondary | ICD-10-CM | POA: Diagnosis not present

## 2022-04-20 NOTE — Progress Notes (Signed)
Cardiology Office Note:    Date:  04/20/2022   ID:  Shannon Baxter, DOB December 18, 1960, MRN [id number]  PCP:  Paulina Fusi, MD  Cardiologist:  Gypsy Balsam, MD    Referring MD: Paulina Fusi, MD   Chief Complaint  Patient presents with   Follow-up  I was in the emergency room because of palpitation but I am doing well  History of Present Illness:    Shannon Baxter is a 61 y.o. female  past medical history significant for aortic valve replacement with mechanical Saint Jude 23 mm prosthesis that was done at [facility], paroxysmal atrial fibrillation/flutter, cardiomyopathy with ejection fraction 40 to 45% assess on echocardiogram from November 2021, essential hypertension, diabetes, dyslipidemia. Comes today to my office in follow-up last time I seen her she was complaining from some shortness of breath.  Concern was about potentially having issue with amiodarone recently she also end up going to the emergency room because of palpitations she felt her heart speeding up and she was worried she was in atrial fibrillation however EKG done in the emergency room that I review showed normal sinus rhythm/sinus tachycardia, biochemical markers were checked for marker injury dose were negative and she was discharged home.  She apparently was complaining of having some heaviness in the chest while she was in the emergency room.  Since being discharged from the hospital she is doing very well.  Denies have any chest pain tightness squeezing pressure burning chest no palpitations.  Past Medical History:  Diagnosis Date   Acute on chronic systolic heart failure (HCC) 02/21/2016   Allergy    Aortic insufficiency    Aortic stenosis    Arthritis    Asthma    Atherosclerosis of native arteries of extremities with intermittent claudication, bilateral legs (HCC) 08/04/2015   Carotid atherosclerosis 07/26/2015   CHF (congestive heart failure) (HCC)    Chronic systolic congestive heart failure (HCC) 12/06/2016    Claudication (HCC)    Congestive heart failure (CHF) (HCC)    Coronary artery disease    Coronary artery disease involving native coronary artery of native heart without angina pectoris 07/26/2015   Diabetes mellitus without complication (HCC)    Dilated cardiomyopathy (HCC) 09/03/2016   Overview:  Ejection fraction 20-25%   Dyslipidemia 07/26/2015   Dyspnea on exertion 10/26/2015   Essential hypertension 07/26/2015   GERD (gastroesophageal reflux disease)    Heart murmur    History of mechanical aortic valve replacement 03/29/2016   Overview:  May 2017. St Judes 23 mm  Formatting of this note might be different from the original. Overview:  May 2017. St Judes 23 mm Formatting of this note might be different from the original. May 2017. St Judes 23 mm   History of tobacco abuse 07/26/2015   Hyperlipidemia    Hypertension    Hypothyroidism 01/23/2016   Intermittent claudication (HCC) 07/26/2015   Morbid obesity (HCC) 12/06/2016   Nutritional and metabolic cardiomyopathy (HCC)    Paroxysmal atrial fibrillation (HCC) 03/06/2016   Paroxysmal atrial flutter (HCC) 09/01/2018   Peripheral vascular disease (HCC) 07/26/2015   Presence of automatic implantable cardioverter-defibrillator 10/23/2016   PVD (peripheral vascular disease) (HCC)    Rheumatic aortic valve insufficiency 03/05/2016   Sleep apnea    Stroke (HCC)    Type 2 diabetes mellitus with diabetic peripheral angiopathy without gangrene, without long-term current use of insulin (HCC) 11/22/2015    Past Surgical History:  Procedure Laterality Date   A-FLUTTER ABLATION N/A 11/05/2018   Procedure:  A-FLUTTER ABLATION;  Surgeon: Regan Lemming, MD;  Location: [facility] INVASIVE CV LAB;  Service: Cardiovascular;  Laterality: N/A;   CARDIAC CATHETERIZATION     INSERT / REPLACE / REMOVE PACEMAKER     Medtronic ICD   MECHANICAL AORTIC VALVE REPLACEMENT     PERIPHERAL ARTERIAL STENT GRAFT     TUBAL LIGATION      Current  Medications: Current Meds  Medication Sig   albuterol (PROVENTIL HFA;VENTOLIN HFA) 108 (90 Base) MCG/ACT inhaler Inhale 1-2 puffs into the lungs every 6 (six) hours as needed for wheezing or shortness of breath.   ALPRAZolam (XANAX) 0.5 MG tablet Take 0.5 mg by mouth 3 (three) times daily as needed for anxiety.    amiodarone (PACERONE) 200 MG tablet Take 0.5 tablets (100 mg total) by mouth daily.   atorvastatin (LIPITOR) 80 MG tablet Take 80 mg by mouth daily.   carvedilol (COREG) 25 MG tablet TAKE 1 TABLET BY MOUTH TWICE A DAY   clopidogrel (PLAVIX) 75 MG tablet Take 75 mg by mouth daily after breakfast.    COMBIVENT RESPIMAT 20-100 MCG/ACT AERS respimat Inhale 1 puff into the lungs every 6 (six) hours as needed for wheezing or shortness of breath.   Cyanocobalamin (B-12) 500 MCG TABS Take 3 tablets by mouth daily.   ENTRESTO 49-51 MG TAKE 1 TABLET BY MOUTH 2 TIMES DAILY   EPIPEN 2-PAK 0.3 MG/0.3ML SOAJ injection Inject 0.3 mg into the muscle once.    ezetimibe (ZETIA) 10 MG tablet Take 10 mg by mouth daily.   fluticasone (FLONASE) 50 MCG/ACT nasal spray Place 2 sprays into both nostrils daily as needed for allergies.    furosemide (LASIX) 20 MG tablet TAKE 3 TABLETS BY MOUTH 2 TIMES DAILY.   gabapentin (NEURONTIN) 300 MG capsule Take 300 mg by mouth 3 (three) times daily.    glimepiride (AMARYL) 4 MG tablet Take 4 mg by mouth daily with breakfast.    hydrALAZINE (APRESOLINE) 25 MG tablet Take 25 mg by mouth 3 (three) times daily.   HYDROcodone-acetaminophen (NORCO) 10-325 MG tablet Take 1 tablet by mouth 4 (four) times daily as needed for moderate pain or severe pain.   ipratropium-albuterol (DUONEB) 0.5-2.5 (3) MG/3ML SOLN Take 3 mLs by nebulization 2 (two) times daily.    levothyroxine (SYNTHROID) 100 MCG tablet Take 100 mcg by mouth daily.   linaclotide (LINZESS) 72 MCG capsule Take 72 mcg by mouth daily as needed (constipation).   loratadine (CLARITIN) 10 MG tablet Take 10 mg by mouth at  bedtime.    metFORMIN (GLUCOPHAGE) 500 MG tablet Take 500 mg by mouth 2 (two) times daily with a meal.    montelukast (SINGULAIR) 10 MG tablet Take 10 mg by mouth at bedtime.    Multiple Vitamins-Minerals (CENTRUM SILVER ULTRA WOMENS) TABS Take 1 tablet by mouth daily. Unknown strength   nystatin (MYCOSTATIN/NYSTOP) powder Apply 1 application topically 3 (three) times daily. Unknown strength   omeprazole (PRILOSEC) 20 MG capsule Take 20 mg by mouth at bedtime.    ondansetron (ZOFRAN-ODT) 4 MG disintegrating tablet Take 4 mg by mouth every 6 (six) hours.   Potassium Chloride ER 20 MEQ TBCR TAKE 1 TABLET BY MOUTH EVERY DAY   Red Yeast Rice 600 MG TABS Take 2 tablets by mouth daily.   rosuvastatin (CRESTOR) 20 MG tablet TAKE 1 TABLET BY MOUTH EVERY DAY   TRELEGY ELLIPTA 100-62.5-25 MCG/ACT AEPB Inhale 1 puff into the lungs daily.   warfarin (COUMADIN) 4 MG tablet Take 4  mg by mouth daily.   warfarin (COUMADIN) 5 MG tablet Take 5 mg by mouth See admin instructions. Take 6 mg on Sunday, Tuesday, and  Thursday . Take 5 mg on all other days     Allergies:   Iodinated contrast media and Ioxaglate   Social History   Socioeconomic History   Marital status: Single    Spouse name: Not on file   Number of children: Not on file   Years of education: Not on file   Highest education level: Not on file  Occupational History   Not on file  Tobacco Use   Smoking status: Former   Smokeless tobacco: Never  Vaping Use   Vaping Use: Never used  Substance and Sexual Activity   Alcohol use: No   Drug use: No   Sexual activity: Not on file  Other Topics Concern   Not on file  Social History Narrative   Not on file   Social Determinants of Health   Financial Resource Strain: Not on file  Food Insecurity: Not on file  Transportation Needs: No Transportation Needs (05/14/2019)   PRAPARE - Transportation    Lack of Transportation (Medical): No    Lack of Transportation (Non-Medical): No  Physical  Activity: Not on file  Stress: Not on file  Social Connections: Not on file     Family History: The patient's family history includes Heart disease in her brother. ROS:   Please see the history of present illness.    All 14 point review of systems negative except as described per history of present illness  EKGs/Labs/Other Studies Reviewed:      Recent Labs: 06/19/2021: ALT 20 03/21/2022: Hemoglobin 12.6; NT-Pro BNP 652; Platelets 220; TSH 3.600 03/29/2022: BUN 12; Creatinine, Ser 0.90; Potassium 4.0; Sodium 145  Recent Lipid Panel    Component Value Date/Time   CHOL 176 06/19/2021 1624   TRIG 108 06/19/2021 1624   HDL 48 06/19/2021 1624   CHOLHDL 3.7 06/19/2021 1624   CHOLHDL 4.3 07/04/2007 0815   VLDL 28 07/04/2007 0815   LDLCALC 108 (H) 06/19/2021 1624    Physical Exam:    VS:  BP 130/79 (BP Location: Left Arm, Patient Position: Sitting, Cuff Size: Normal)   Pulse 73   Ht 6' (1.829 m)   Wt (!) 340 lb (154.2 kg)   SpO2 94%   BMI 46.11 kg/m     Wt Readings from Last 3 Encounters:  04/20/22 (!) 340 lb (154.2 kg)  03/21/22 (!) 346 lb 9.6 oz (157.2 kg)  12/19/21 (!) 346 lb (156.9 kg)     GEN:  Well nourished, well developed in no acute distress HEENT: Normal NECK: No JVD; No carotid bruits LYMPHATICS: No lymphadenopathy CARDIAC: RRR, normal mechanical valve sounds , no rubs, no gallops RESPIRATORY:  Clear to auscultation without rales, wheezing or rhonchi  ABDOMEN: Soft, non-tender, non-distended MUSCULOSKELETAL:  No edema; No deformity  SKIN: Warm and dry LOWER EXTREMITIES: no swelling NEUROLOGIC:  Alert and oriented x 3 PSYCHIATRIC:  Normal affect   ASSESSMENT:    1. History of mechanical aortic valve replacement   2. Dyslipidemia   3. Diabetes mellitus without complication (HCC)   4. Chronic diastolic congestive heart failure (HCC)   5. Essential hypertension    PLAN:    In order of problems listed above:  Mechanical aortic valve functioning  normally based on last echocardiogram continue present management. Atypical chest pain.  I will schedule her to have a stress test Diabetes: That  being followed by internal medicine team Paroxysmal atrial fibrillation/atrial flutter suppressed with amiodarone we will continue present management. Chronic diastolic congestive heart failure appears to be compensated on physical exam.  We will continue present management.   Medication Adjustments/Labs and Tests Ordered: Current medicines are reviewed at length with the patient today.  Concerns regarding medicines are outlined above.  No orders of the defined types were placed in this encounter.  Medication changes: No orders of the defined types were placed in this encounter.   Signed, Georgeanna Lea, MD, [facility] 04/20/2022 10:40 AM    [facility]

## 2022-04-20 NOTE — Patient Instructions (Signed)
Medication Instructions:  Your physician recommends that you continue on your current medications as directed. Please refer to the Current Medication list given to you today.  *If you need a refill on your cardiac medications before your next appointment, please call your pharmacy*   Lab Work: NONE If you have labs (blood work) drawn today and your tests are completely normal, you will receive your results only by: MyChart Message (if you have MyChart) OR A paper copy in the mail If you have any lab test that is abnormal or we need to change your treatment, we will call you to review the results.   Testing/Procedures: Your physician has requested that you have a lexiscan myoview. For further information please visit [email or web address] Please follow instruction sheet, as given.   The test will take approximately 3 to 4 hours to complete; you may bring reading material.  If someone comes with you to your appointment, they will need to remain in the main lobby due to limited space in the testing area. **If you are pregnant or breastfeeding, please notify the nuclear lab prior to your appointment**  How to prepare for your Myocardial Perfusion Test: Do not eat or drink 3 hours prior to your test, except you may have water. Do not consume products containing caffeine (regular or decaffeinated) 12 hours prior to your test. (ex: coffee, chocolate, sodas, tea). Do bring a list of your current medications with you.  If not listed below, you may take your medications as normal. Do wear comfortable clothes (no dresses or overalls) and walking shoes, tennis shoes preferred (No heels or open toe shoes are allowed). Do NOT wear cologne, perfume, aftershave, or lotions (deodorant is allowed). If these instructions are not followed, your test will have to be rescheduled.    Follow-Up: At [facility], you and your health needs are our priority.  As part of our continuing mission to provide you with  exceptional heart care, we have created designated Provider Care Teams.  These Care Teams include your primary Cardiologist (physician) and Advanced Practice Providers (APPs -  Physician Assistants and Nurse Practitioners) who all work together to provide you with the care you need, when you need it.  We recommend signing up for the patient portal called "MyChart".  Sign up information is provided on this After Visit Summary.  MyChart is used to connect with patients for Virtual Visits (Telemedicine).  Patients are able to view lab/test results, encounter notes, upcoming appointments, etc.  Non-urgent messages can be sent to your provider as well.   To learn more about what you can do with MyChart, go to [email or web address].    Your next appointment:   3 month(s)  The format for your next appointment:   In Person  Provider:   Gypsy Balsam, MD    Other Instructions   Important Information About Sugar

## 2022-04-20 NOTE — Telephone Encounter (Signed)
Patient given detailed instructions per Myocardial Perfusion Study Information Sheet for the test on 04/23/22 at 1000. Patient notified to arrive 15 minutes early and that it is imperative to arrive on time for appointment to keep from having the test rescheduled.  If you need to cancel or reschedule your appointment, please call the office within 24 hours of your appointment. . Patient verbalized understanding. TMY

## 2022-04-23 ENCOUNTER — Ambulatory Visit (HOSPITAL_COMMUNITY): Payer: Medicare Other | Attending: Cardiology

## 2022-04-23 DIAGNOSIS — E785 Hyperlipidemia, unspecified: Secondary | ICD-10-CM | POA: Diagnosis not present

## 2022-04-23 DIAGNOSIS — I10 Essential (primary) hypertension: Secondary | ICD-10-CM

## 2022-04-23 DIAGNOSIS — I5032 Chronic diastolic (congestive) heart failure: Secondary | ICD-10-CM

## 2022-04-23 DIAGNOSIS — E119 Type 2 diabetes mellitus without complications: Secondary | ICD-10-CM | POA: Diagnosis not present

## 2022-04-23 DIAGNOSIS — Z952 Presence of prosthetic heart valve: Secondary | ICD-10-CM

## 2022-04-23 MED ORDER — AMINOPHYLLINE 25 MG/ML IV SOLN
75.0000 mg | Freq: Once | INTRAVENOUS | Status: AC
  Administered 2022-04-23: 75 mg via INTRAVENOUS

## 2022-04-23 MED ORDER — TECHNETIUM TC 99M TETROFOSMIN IV KIT
33.0000 | PACK | Freq: Once | INTRAVENOUS | Status: AC | PRN
  Administered 2022-04-23: 33 via INTRAVENOUS

## 2022-04-23 MED ORDER — REGADENOSON 0.4 MG/5ML IV SOLN
0.4000 mg | Freq: Once | INTRAVENOUS | Status: AC
  Administered 2022-04-23: 0.4 mg via INTRAVENOUS

## 2022-04-25 ENCOUNTER — Ambulatory Visit (HOSPITAL_COMMUNITY): Payer: Medicare Other | Attending: Cardiology

## 2022-04-25 DIAGNOSIS — R791 Abnormal coagulation profile: Secondary | ICD-10-CM | POA: Diagnosis not present

## 2022-04-25 LAB — MYOCARDIAL PERFUSION IMAGING
LV dias vol: 361 mL (ref 46–106)
LV sys vol: 267 mL
Nuc Stress EF: 26 %
Peak HR: 85 {beats}/min
Rest HR: 74 {beats}/min
Rest Nuclear Isotope Dose: 31.7 mCi
SDS: 4
SRS: 3
SSS: 7
ST Depression (mm): 0 mm
Stress Nuclear Isotope Dose: 33 mCi
TID: 1.04

## 2022-04-25 MED ORDER — TECHNETIUM TC 99M TETROFOSMIN IV KIT
31.7000 | PACK | Freq: Once | INTRAVENOUS | Status: AC | PRN
  Administered 2022-04-25: 31.7 via INTRAVENOUS

## 2022-04-26 DIAGNOSIS — R791 Abnormal coagulation profile: Secondary | ICD-10-CM | POA: Diagnosis not present

## 2022-04-27 DIAGNOSIS — R791 Abnormal coagulation profile: Secondary | ICD-10-CM | POA: Diagnosis not present

## 2022-05-01 DIAGNOSIS — R791 Abnormal coagulation profile: Secondary | ICD-10-CM | POA: Diagnosis not present

## 2022-05-11 ENCOUNTER — Telehealth: Payer: Self-pay

## 2022-05-11 NOTE — Telephone Encounter (Signed)
LVM per DPR- Per Dr. Vanetta Shawl note regarding test results. Encouraged pt to call with any questions or concerns.

## 2022-05-16 DIAGNOSIS — R791 Abnormal coagulation profile: Secondary | ICD-10-CM | POA: Diagnosis not present

## 2022-05-18 DIAGNOSIS — Z122 Encounter for screening for malignant neoplasm of respiratory organs: Secondary | ICD-10-CM | POA: Diagnosis not present

## 2022-05-18 DIAGNOSIS — Z87891 Personal history of nicotine dependence: Secondary | ICD-10-CM | POA: Diagnosis not present

## 2022-05-21 DIAGNOSIS — R918 Other nonspecific abnormal finding of lung field: Secondary | ICD-10-CM | POA: Diagnosis not present

## 2022-05-21 DIAGNOSIS — J4541 Moderate persistent asthma with (acute) exacerbation: Secondary | ICD-10-CM | POA: Diagnosis not present

## 2022-05-21 DIAGNOSIS — J301 Allergic rhinitis due to pollen: Secondary | ICD-10-CM | POA: Diagnosis not present

## 2022-05-21 DIAGNOSIS — R051 Acute cough: Secondary | ICD-10-CM | POA: Diagnosis not present

## 2022-05-21 DIAGNOSIS — G4733 Obstructive sleep apnea (adult) (pediatric): Secondary | ICD-10-CM | POA: Diagnosis not present

## 2022-05-21 DIAGNOSIS — Z03818 Encounter for observation for suspected exposure to other biological agents ruled out: Secondary | ICD-10-CM | POA: Diagnosis not present

## 2022-05-23 DIAGNOSIS — R791 Abnormal coagulation profile: Secondary | ICD-10-CM | POA: Diagnosis not present

## 2022-05-24 DIAGNOSIS — M1711 Unilateral primary osteoarthritis, right knee: Secondary | ICD-10-CM | POA: Diagnosis not present

## 2022-05-24 DIAGNOSIS — M1611 Unilateral primary osteoarthritis, right hip: Secondary | ICD-10-CM | POA: Diagnosis not present

## 2022-05-29 ENCOUNTER — Ambulatory Visit (INDEPENDENT_AMBULATORY_CARE_PROVIDER_SITE_OTHER): Payer: Medicare Other

## 2022-05-29 ENCOUNTER — Telehealth: Payer: Self-pay | Admitting: Cardiology

## 2022-05-29 DIAGNOSIS — I420 Dilated cardiomyopathy: Secondary | ICD-10-CM

## 2022-05-29 LAB — CUP PACEART REMOTE DEVICE CHECK
Battery Remaining Longevity: 57 mo
Battery Voltage: 2.97 V
Brady Statistic AP VP Percent: 0 %
Brady Statistic AP VS Percent: 0.92 %
Brady Statistic AS VP Percent: 0.04 %
Brady Statistic AS VS Percent: 99.04 %
Brady Statistic RA Percent Paced: 0.92 %
Brady Statistic RV Percent Paced: 0.05 %
Date Time Interrogation Session: 20230808043823
HighPow Impedance: 76 Ohm
Implantable Lead Implant Date: 20180102
Implantable Lead Implant Date: 20180102
Implantable Lead Location: 753859
Implantable Lead Location: 753860
Implantable Lead Model: 5076
Implantable Pulse Generator Implant Date: 20180102
Lead Channel Impedance Value: 323 Ohm
Lead Channel Impedance Value: 399 Ohm
Lead Channel Impedance Value: 456 Ohm
Lead Channel Pacing Threshold Amplitude: 0.375 V
Lead Channel Pacing Threshold Amplitude: 0.875 V
Lead Channel Pacing Threshold Pulse Width: 0.4 ms
Lead Channel Pacing Threshold Pulse Width: 0.4 ms
Lead Channel Sensing Intrinsic Amplitude: 28.125 mV
Lead Channel Sensing Intrinsic Amplitude: 28.125 mV
Lead Channel Sensing Intrinsic Amplitude: 4.75 mV
Lead Channel Sensing Intrinsic Amplitude: 4.75 mV
Lead Channel Setting Pacing Amplitude: 1.5 V
Lead Channel Setting Pacing Amplitude: 2 V
Lead Channel Setting Pacing Pulse Width: 0.4 ms
Lead Channel Setting Sensing Sensitivity: 0.3 mV

## 2022-05-29 NOTE — Telephone Encounter (Signed)
Dr. Bing Matter, you recently saw this patient on 04/20/22 who is now pending fluoroscopy guided hip injection.  Based on your recent assessment and the results of the [facility], do you feel that she is at acceptable risk for upcoming procedure?  Please send your response to p cv div preop.  Sending note to requesting provider that coumadin is managed by PCP.  Thank you, Levi Aland, NP-C    05/29/2022, 2:56 PM [facility] [address] Office [phone] Fax [phone]

## 2022-05-29 NOTE — Telephone Encounter (Signed)
   Pre-operative Risk Assessment    Patient Name: Shannon Baxter  DOB: [birth year] MRN: [id number]      Request for Surgical Clearance    Procedure:   Fluoroscopy Guided Hip Injection   Date of Surgery:  Clearance TBD                                 Surgeon:  Cyndia Bent Group or Practice Name:  [facility]  Phone number:  [phone] Fax number:  [phone]   Type of Clearance Requested:   - Medical  - Pharmacy:  Hold Warfarin (Coumadin) Wanting to hold 4 days prior to procedure    Type of Anesthesia:  Not Indicated   Additional requests/questions:    Signed, April Henson   05/29/2022, 2:15 PM

## 2022-05-31 ENCOUNTER — Ambulatory Visit (INDEPENDENT_AMBULATORY_CARE_PROVIDER_SITE_OTHER): Payer: Medicare Other | Admitting: Podiatry

## 2022-05-31 DIAGNOSIS — M79675 Pain in left toe(s): Secondary | ICD-10-CM | POA: Diagnosis not present

## 2022-05-31 DIAGNOSIS — B351 Tinea unguium: Secondary | ICD-10-CM

## 2022-05-31 DIAGNOSIS — M79674 Pain in right toe(s): Secondary | ICD-10-CM | POA: Diagnosis not present

## 2022-05-31 DIAGNOSIS — E119 Type 2 diabetes mellitus without complications: Secondary | ICD-10-CM

## 2022-05-31 DIAGNOSIS — L600 Ingrowing nail: Secondary | ICD-10-CM | POA: Diagnosis not present

## 2022-06-04 NOTE — Telephone Encounter (Signed)
   Patient Name: Shannon Baxter  DOB: Dec 25, 1960 MRN: [id number]  Primary Cardiologist: Gypsy Balsam, MD  Chart reviewed as part of pre-operative protocol coverage. Patient has upcoming fluoroscopy guided hip injection planned. She was last seen by Dr. Bing Matter on 04/20/2022 at which time she reported some atypical chest pain. Lexiscan Myoview was ordered. This was done on 04/25/2022 and showed a small regional of infraction with per-infarct ischemia. Medical therapy was recommended.  Given past medical history and time since last visit, based on ACC/AHA guidelines, [name] would be at acceptable risk for the planned procedure without further cardiovascular testing. Per Dr. Bing Matter, patient can hold Coumadin for 4 days prior to injection. Please restart this as soon as safely possible afterwards.   I will route this recommendation to the requesting party via Epic fax function and remove from pre-op pool.  Please call with questions.  Corrin Parker, PA-C 06/04/2022, 7:39 AM

## 2022-06-05 ENCOUNTER — Encounter: Payer: Self-pay | Admitting: Podiatry

## 2022-06-05 NOTE — Progress Notes (Signed)
  Subjective:  Patient ID: Shannon Baxter, female    DOB: 1960/12/20,  MRN: [id number]  Saniyyah Elster presents to clinic today for preventative diabetic foot care and painful elongated mycotic toenails 1-5 bilaterally which are tender when wearing enclosed shoe gear. Pain is relieved with periodic professional debridement.  Last A1c was 6.0%. Patient did not check blood glucose today.  New problem(s): None.   PCP is Paulina Fusi, MD , and last visit was  Mar 05, 2022  Allergies  Allergen Reactions   Iodinated Contrast Media Hives and Rash   Ioxaglate Hives    Review of Systems: Negative except as noted in the HPI.  Objective: No changes noted in today's physical examination. Vascular Examination: CFT <3 seconds b/l. DP pulses faintly palpable b/l. PT pulses diminished b/l. Digital hair absent. Skin temperature gradient warm to warm b/l. No ischemia or gangrene. No cyanosis or clubbing noted b/l.    Neurological Examination: Sensation grossly intact b/l with 10 gram monofilament. Vibratory sensation intact b/l.   Dermatological Examination: Pedal skin warm and supple b/l. Toenails 1-5 b/l thick, discolored, elongated with subungual debris and pain on dorsal palpation.  Toenails 1-5 b/l elongated, discolored, dystrophic, thickened, crumbly with subungual debris and tenderness to dorsal palpation. Incurvated nailplate bilateral great toes.  Nail border hypertrophy absent. There is tenderness to palpation. Sign(s) of infection: no clinical signs of infection noted on examination today..  Musculoskeletal Examination: Muscle strength 5/5 to b/l LE. Pes planus deformity noted bilateral LE.  Radiographs: None  Assessment/Plan: 1. Pain due to onychomycosis of toenails of both feet   2. Ingrown toenail without infection   3. Diabetes mellitus without complication (HCC)     -Patient was evaluated and treated. All patient's and/or POA's questions/concerns answered on today's  visit. -Continue foot and shoe inspections daily. Monitor blood glucose per PCP/Endocrinologist's recommendations. -Patient to continue soft, supportive shoe gear daily. -Mycotic toenails 1-5 bilaterally were debrided in length and girth with sterile nail nippers and dremel without incident. -Offending nail border debrided and curretaged bilateral great toes utilizing sterile nail nipper and currette. Border(s) cleansed with alcohol and TAO applied. Patient/POA/Caregiver/Facility instructed to apply triple antibiotic ointment  to bilateral great toes once daily for 7 days. Call office if there are any concerns. -Patient/POA to call should there be question/concern in the interim.   Return in about 3 months (around 08/31/2022).  Freddie Breech, DPM

## 2022-06-06 DIAGNOSIS — R791 Abnormal coagulation profile: Secondary | ICD-10-CM | POA: Diagnosis not present

## 2022-06-12 ENCOUNTER — Other Ambulatory Visit: Payer: Self-pay | Admitting: Cardiology

## 2022-06-13 DIAGNOSIS — R791 Abnormal coagulation profile: Secondary | ICD-10-CM | POA: Diagnosis not present

## 2022-06-14 DIAGNOSIS — M1611 Unilateral primary osteoarthritis, right hip: Secondary | ICD-10-CM | POA: Diagnosis not present

## 2022-06-14 DIAGNOSIS — M25551 Pain in right hip: Secondary | ICD-10-CM | POA: Diagnosis not present

## 2022-06-20 DIAGNOSIS — R791 Abnormal coagulation profile: Secondary | ICD-10-CM | POA: Diagnosis not present

## 2022-06-21 DIAGNOSIS — E1142 Type 2 diabetes mellitus with diabetic polyneuropathy: Secondary | ICD-10-CM | POA: Diagnosis not present

## 2022-06-21 DIAGNOSIS — J449 Chronic obstructive pulmonary disease, unspecified: Secondary | ICD-10-CM | POA: Diagnosis not present

## 2022-06-26 DIAGNOSIS — R791 Abnormal coagulation profile: Secondary | ICD-10-CM | POA: Diagnosis not present

## 2022-06-28 DIAGNOSIS — J4541 Moderate persistent asthma with (acute) exacerbation: Secondary | ICD-10-CM | POA: Diagnosis not present

## 2022-06-28 DIAGNOSIS — R918 Other nonspecific abnormal finding of lung field: Secondary | ICD-10-CM | POA: Diagnosis not present

## 2022-06-28 DIAGNOSIS — G4733 Obstructive sleep apnea (adult) (pediatric): Secondary | ICD-10-CM | POA: Diagnosis not present

## 2022-06-28 DIAGNOSIS — J301 Allergic rhinitis due to pollen: Secondary | ICD-10-CM | POA: Diagnosis not present

## 2022-07-03 DIAGNOSIS — Z7901 Long term (current) use of anticoagulants: Secondary | ICD-10-CM | POA: Diagnosis not present

## 2022-07-03 NOTE — Progress Notes (Signed)
Remote ICD transmission.   

## 2022-07-04 ENCOUNTER — Telehealth: Payer: Self-pay | Admitting: *Deleted

## 2022-07-04 NOTE — Patient Outreach (Signed)
  Care Coordination   07/04/2022 Name: Shannon Baxter MRN: [id number] DOB: 07/23/1961   Care Coordination Outreach Attempts:  An unsuccessful telephone outreach was attempted today to offer the patient information about available care coordination services as a benefit of their health plan.   Follow Up Plan:  Additional outreach attempts will be made to offer the patient care coordination information and services.   Encounter Outcome:  No Answer  Care Coordination Interventions Activated:  No   Care Coordination Interventions:  No, not indicated    Reece Levy MSW, LCSW Licensed Clinical Social Worker      [phone]

## 2022-07-05 DIAGNOSIS — G4733 Obstructive sleep apnea (adult) (pediatric): Secondary | ICD-10-CM | POA: Diagnosis not present

## 2022-07-09 DIAGNOSIS — M1611 Unilateral primary osteoarthritis, right hip: Secondary | ICD-10-CM | POA: Diagnosis not present

## 2022-07-09 DIAGNOSIS — M1711 Unilateral primary osteoarthritis, right knee: Secondary | ICD-10-CM | POA: Diagnosis not present

## 2022-07-11 DIAGNOSIS — R791 Abnormal coagulation profile: Secondary | ICD-10-CM | POA: Diagnosis not present

## 2022-07-12 DIAGNOSIS — R791 Abnormal coagulation profile: Secondary | ICD-10-CM | POA: Diagnosis not present

## 2022-07-16 ENCOUNTER — Encounter: Payer: Self-pay | Admitting: Psychiatry

## 2022-07-16 DIAGNOSIS — Z1231 Encounter for screening mammogram for malignant neoplasm of breast: Secondary | ICD-10-CM | POA: Diagnosis not present

## 2022-07-18 DIAGNOSIS — E785 Hyperlipidemia, unspecified: Secondary | ICD-10-CM | POA: Diagnosis not present

## 2022-07-18 DIAGNOSIS — Z79899 Other long term (current) drug therapy: Secondary | ICD-10-CM | POA: Diagnosis not present

## 2022-07-18 DIAGNOSIS — E039 Hypothyroidism, unspecified: Secondary | ICD-10-CM | POA: Diagnosis not present

## 2022-07-18 DIAGNOSIS — R791 Abnormal coagulation profile: Secondary | ICD-10-CM | POA: Diagnosis not present

## 2022-07-18 DIAGNOSIS — E1142 Type 2 diabetes mellitus with diabetic polyneuropathy: Secondary | ICD-10-CM | POA: Diagnosis not present

## 2022-07-23 ENCOUNTER — Ambulatory Visit: Payer: Medicare Other | Attending: Cardiology | Admitting: Cardiology

## 2022-07-23 ENCOUNTER — Encounter: Payer: Self-pay | Admitting: Cardiology

## 2022-07-23 VITALS — BP 140/90 | HR 78 | Ht 72.0 in | Wt 338.0 lb

## 2022-07-23 DIAGNOSIS — I420 Dilated cardiomyopathy: Secondary | ICD-10-CM | POA: Diagnosis not present

## 2022-07-23 DIAGNOSIS — E1151 Type 2 diabetes mellitus with diabetic peripheral angiopathy without gangrene: Secondary | ICD-10-CM

## 2022-07-23 DIAGNOSIS — R0609 Other forms of dyspnea: Secondary | ICD-10-CM | POA: Diagnosis not present

## 2022-07-23 DIAGNOSIS — E785 Hyperlipidemia, unspecified: Secondary | ICD-10-CM

## 2022-07-23 DIAGNOSIS — Z95810 Presence of automatic (implantable) cardiac defibrillator: Secondary | ICD-10-CM

## 2022-07-23 DIAGNOSIS — E039 Hypothyroidism, unspecified: Secondary | ICD-10-CM | POA: Diagnosis not present

## 2022-07-23 DIAGNOSIS — J309 Allergic rhinitis, unspecified: Secondary | ICD-10-CM | POA: Diagnosis not present

## 2022-07-23 DIAGNOSIS — E1142 Type 2 diabetes mellitus with diabetic polyneuropathy: Secondary | ICD-10-CM | POA: Diagnosis not present

## 2022-07-23 NOTE — Patient Instructions (Addendum)
Medication Instructions:  Your physician recommends that you continue on your current medications as directed. Please refer to the Current Medication list given to you today.  *If you need a refill on your cardiac medications before your next appointment, please call your pharmacy*   Lab Work: None Ordered If you have labs (blood work) drawn today and your tests are completely normal, you will receive your results only by: MyChart Message (if you have MyChart) OR A paper copy in the mail If you have any lab test that is abnormal or we need to change your treatment, we will call you to review the results.   Testing/Procedures: Your physician has requested that you have an echocardiogram. Echocardiography is a painless test that uses sound waves to create images of your heart. It provides your doctor with information about the size and shape of your heart and how well your heart's chambers and valves are working. This procedure takes approximately one hour. There are no restrictions for this procedure.    Follow-Up: At [facility], you and your health needs are our priority.  As part of our continuing mission to provide you with exceptional heart care, we have created designated Provider Care Teams.  These Care Teams include your primary Cardiologist (physician) and Advanced Practice Providers (APPs -  Physician Assistants and Nurse Practitioners) who all work together to provide you with the care you need, when you need it.  We recommend signing up for the patient portal called "MyChart".  Sign up information is provided on this After Visit Summary.  MyChart is used to connect with patients for Virtual Visits (Telemedicine).  Patients are able to view lab/test results, encounter notes, upcoming appointments, etc.  Non-urgent messages can be sent to your provider as well.   To learn more about what you can do with MyChart, go to [email or web address].    Your next appointment:   5  month(s)  The format for your next appointment:   In Person  Provider:   Robert Krasowski, MD    Other Instructions NA  

## 2022-07-23 NOTE — Progress Notes (Signed)
Cardiology Office Note:    Date:  07/23/2022   ID:  Shannon Baxter, DOB 09/04/1961, MRN [id number]  PCP:  Paulina Fusi, MD  Cardiologist:  Gypsy Balsam, MD    Referring MD: Paulina Fusi, MD   Chief Complaint  Patient presents with   Follow-up  Doing well  History of Present Illness:    Shannon Baxter is a 61 y.o. female  past medical history significant for aortic valve replacement with mechanical Saint Jude 23 mm prosthesis that was done at [facility] paroxysmal atrial fibrillation/flutter, cardiomyopathy with ejection fraction 40 to 45% assess on echocardiogram from November 2021, essential hypertension, diabetes, dyslipidemia. She comes today to muscle follow-up overall she is doing well but she complain of having some allergies.  There is no fever no chills just sneezing nose.  No chest pain tightness squeezing pressure burning chest no swelling of lower extremities.  Past Medical History:  Diagnosis Date   Acute on chronic systolic heart failure (HCC) 02/21/2016   Allergy    Aortic insufficiency    Aortic stenosis    Arthritis    Asthma    Atherosclerosis of native arteries of extremities with intermittent claudication, bilateral legs (HCC) 08/04/2015   Carotid atherosclerosis 07/26/2015   CHF (congestive heart failure) (HCC)    Chronic systolic congestive heart failure (HCC) 12/06/2016   Claudication (HCC)    Congestive heart failure (CHF) (HCC)    Coronary artery disease    Coronary artery disease involving native coronary artery of native heart without angina pectoris 07/26/2015   Diabetes mellitus without complication (HCC)    Dilated cardiomyopathy (HCC) 09/03/2016   Overview:  Ejection fraction 20-25%   Dyslipidemia 07/26/2015   Dyspnea on exertion 10/26/2015   Essential hypertension 07/26/2015   GERD (gastroesophageal reflux disease)    Heart murmur    History of mechanical aortic valve replacement 03/29/2016   Overview:  May 2017. St Judes 23 mm   Formatting of this note might be different from the original. Overview:  May 2017. St Judes 23 mm Formatting of this note might be different from the original. May 2017. St Judes 23 mm   History of tobacco abuse 07/26/2015   Hyperlipidemia    Hypertension    Hypothyroidism 01/23/2016   Intermittent claudication (HCC) 07/26/2015   Morbid obesity (HCC) 12/06/2016   Nutritional and metabolic cardiomyopathy (HCC)    Paroxysmal atrial fibrillation (HCC) 03/06/2016   Paroxysmal atrial flutter (HCC) 09/01/2018   Peripheral vascular disease (HCC) 07/26/2015   Presence of automatic implantable cardioverter-defibrillator 10/23/2016   PVD (peripheral vascular disease) (HCC)    Rheumatic aortic valve insufficiency 03/05/2016   Sleep apnea    Stroke (HCC)    Type 2 diabetes mellitus with diabetic peripheral angiopathy without gangrene, without long-term current use of insulin (HCC) 11/22/2015    Past Surgical History:  Procedure Laterality Date   A-FLUTTER ABLATION N/A 11/05/2018   Procedure: A-FLUTTER ABLATION;  Surgeon: Regan Lemming, MD;  Location: [facility];  Service: Cardiovascular;  Laterality: N/A;   CARDIAC CATHETERIZATION     INSERT / REPLACE / REMOVE PACEMAKER     Medtronic ICD   MECHANICAL AORTIC VALVE REPLACEMENT     PERIPHERAL ARTERIAL STENT GRAFT     TUBAL LIGATION      Current Medications: Current Meds  Medication Sig   albuterol (PROVENTIL HFA;VENTOLIN HFA) 108 (90 Base) MCG/ACT inhaler Inhale 1-2 puffs into the lungs every 6 (six) hours as needed for wheezing or shortness of breath.  ALPRAZolam (XANAX) 0.5 MG tablet Take 0.5 mg by mouth 3 (three) times daily as needed for anxiety.    amiodarone (PACERONE) 200 MG tablet Take 0.5 tablets (100 mg total) by mouth daily.   atorvastatin (LIPITOR) 80 MG tablet Take 80 mg by mouth daily.   carvedilol (COREG) 25 MG tablet TAKE 1 TABLET BY MOUTH TWICE A DAY (Patient taking differently: Take 25 mg by mouth 2 (two) times  daily with a meal.)   clopidogrel (PLAVIX) 75 MG tablet Take 75 mg by mouth daily after breakfast.    COMBIVENT RESPIMAT 20-100 MCG/ACT AERS respimat Inhale 1 puff into the lungs every 6 (six) hours as needed for wheezing or shortness of breath.   Cyanocobalamin (B-12) 500 MCG TABS Take 3 tablets by mouth daily.   doxycycline (VIBRAMYCIN) 100 MG capsule Take 100 mg by mouth 2 (two) times daily.   ENTRESTO 49-51 MG TAKE 1 TABLET BY MOUTH TWICE A DAY   EPIPEN 2-PAK 0.3 MG/0.3ML SOAJ injection Inject 0.3 mg into the muscle once.    ezetimibe (ZETIA) 10 MG tablet Take 10 mg by mouth daily.   fluticasone (FLONASE) 50 MCG/ACT nasal spray Place 2 sprays into both nostrils daily as needed for allergies.    furosemide (LASIX) 20 MG tablet TAKE 3 TABLETS BY MOUTH 2 TIMES DAILY. (Patient taking differently: Take 60 mg by mouth 2 (two) times daily.)   gabapentin (NEURONTIN) 300 MG capsule Take 300 mg by mouth 3 (three) times daily.    glimepiride (AMARYL) 4 MG tablet Take 4 mg by mouth daily with breakfast.    hydrALAZINE (APRESOLINE) 25 MG tablet Take 25 mg by mouth 3 (three) times daily.   HYDROcodone-acetaminophen (NORCO) 10-325 MG tablet Take 1 tablet by mouth 4 (four) times daily as needed for moderate pain or severe pain.   ipratropium-albuterol (DUONEB) 0.5-2.5 (3) MG/3ML SOLN Take 3 mLs by nebulization 2 (two) times daily.    levothyroxine (SYNTHROID) 100 MCG tablet Take 100 mcg by mouth daily.   linaclotide (LINZESS) 72 MCG capsule Take 72 mcg by mouth daily as needed (constipation).   loratadine (CLARITIN) 10 MG tablet Take 10 mg by mouth at bedtime.    metFORMIN (GLUCOPHAGE) 500 MG tablet Take 500 mg by mouth 2 (two) times daily with a meal.    montelukast (SINGULAIR) 10 MG tablet Take 10 mg by mouth at bedtime.    Multiple Vitamins-Minerals (CENTRUM SILVER ULTRA WOMENS) TABS Take 1 tablet by mouth daily. Unknown strength   nystatin (MYCOSTATIN/NYSTOP) powder Apply 1 application topically 3  (three) times daily. Unknown strength   omeprazole (PRILOSEC) 20 MG capsule Take 20 mg by mouth at bedtime.    ondansetron (ZOFRAN-ODT) 4 MG disintegrating tablet Take 4 mg by mouth every 6 (six) hours.   Potassium Chloride ER 20 MEQ TBCR TAKE 1 TABLET BY MOUTH EVERY DAY (Patient taking differently: Take 1 tablet by mouth daily.)   predniSONE (DELTASONE) 10 MG tablet Take 10 mg by mouth daily with breakfast.   Red Yeast Rice 600 MG TABS Take 2 tablets by mouth daily.   rosuvastatin (CRESTOR) 20 MG tablet TAKE 1 TABLET BY MOUTH EVERY DAY (Patient taking differently: Take 20 mg by mouth daily.)   TRELEGY ELLIPTA 100-62.5-25 MCG/ACT AEPB Inhale 1 puff into the lungs daily.   warfarin (COUMADIN) 4 MG tablet Take 4 mg by mouth daily.   warfarin (COUMADIN) 5 MG tablet Take 5 mg by mouth See admin instructions. Take 6 mg on Sunday, Tuesday, and  Thursday . Take 5 mg on all other days   [DISCONTINUED] amoxicillin (AMOXIL) 500 MG capsule SMARTSIG:4 Capsule(s) By Mouth     Allergies:   Iodinated contrast media and Ioxaglate   Social History   Socioeconomic History   Marital status: Single    Spouse name: Not on file   Number of children: Not on file   Years of education: Not on file   Highest education level: Not on file  Occupational History   Not on file  Tobacco Use   Smoking status: Former   Smokeless tobacco: Never  Vaping Use   Vaping Use: Never used  Substance and Sexual Activity   Alcohol use: No   Drug use: No   Sexual activity: Not on file  Other Topics Concern   Not on file  Social History Narrative   Not on file   Social Determinants of Health   Financial Resource Strain: Not on file  Food Insecurity: Not on file  Transportation Needs: No Transportation Needs (05/14/2019)   PRAPARE - Transportation    Lack of Transportation (Medical): No    Lack of Transportation (Non-Medical): No  Physical Activity: Not on file  Stress: Not on file  Social Connections: Not on file      Family History: The patient's family history includes Heart disease in her brother. ROS:   Please see the history of present illness.    All 14 point review of systems negative except as described per history of present illness  EKGs/Labs/Other Studies Reviewed:      Recent Labs: 03/21/2022: Hemoglobin 12.6; NT-Pro BNP 652; Platelets 220; TSH 3.600 03/29/2022: BUN 12; Creatinine, Ser 0.90; Potassium 4.0; Sodium 145  Recent Lipid Panel    Component Value Date/Time   CHOL 176 06/19/2021 1624   TRIG 108 06/19/2021 1624   HDL 48 06/19/2021 1624   CHOLHDL 3.7 06/19/2021 1624   CHOLHDL 4.3 07/04/2007 0815   VLDL 28 07/04/2007 0815   LDLCALC 108 (H) 06/19/2021 1624    Physical Exam:    VS:  BP (!) 140/90 (BP Location: Left Arm, Patient Position: Sitting)   Pulse 78   Ht 6' (1.829 m)   Wt (!) 338 lb (153.3 kg)   SpO2 94%   BMI 45.84 kg/m     Wt Readings from Last 3 Encounters:  07/23/22 (!) 338 lb (153.3 kg)  04/23/22 (!) 340 lb (154.2 kg)  04/20/22 (!) 340 lb (154.2 kg)     GEN:  Well nourished, well developed in no acute distress HEENT: Normal NECK: No JVD; No carotid bruits LYMPHATICS: No lymphadenopathy CARDIAC: RRR, tonsil distant but mechanical valve sounds are crisp, no rubs, no gallops RESPIRATORY:  Clear to auscultation without rales, wheezing or rhonchi  ABDOMEN: Soft, non-tender, non-distended MUSCULOSKELETAL:  No edema; No deformity  SKIN: Warm and dry LOWER EXTREMITIES: no swelling NEUROLOGIC:  Alert and oriented x 3 PSYCHIATRIC:  Normal affect   ASSESSMENT:    1. Presence of automatic implantable cardioverter-defibrillator   2. Dyslipidemia   3. Morbid obesity (HCC)   4. Type 2 diabetes mellitus with diabetic peripheral angiopathy without gangrene, without long-term current use of insulin (HCC)   5. Dilated cardiomyopathy (HCC)    PLAN:    In order of problems listed above:  ICD present it is a Medtronic device, OptiVol stable parameters are  normal. Dyslipidemia I did review K PN which only her LDL 91 HDL 48 this is from October of last year we will recheck her fasting  lipid profile Paroxysmal atrial fibrillation she described another episode of palpitation this is a second episode of palpitation I told her if she it again we will put Zio patch on her it does not vary her episodes.  She is already anticoagulated Mechanical aortic valve.  Central.  She is anticoag with Coumadin which I will continue Is not being followed by internal medicine team last hemoglobin A1c 6.0 which is quite reasonable. History of cardiomyopathy we will recheck her echocardiogram.   Medication Adjustments/Labs and Tests Ordered: Current medicines are reviewed at length with the patient today.  Concerns regarding medicines are outlined above.  No orders of the defined types were placed in this encounter.  Medication changes: No orders of the defined types were placed in this encounter.   Signed, Georgeanna Lea, MD, [facility] 07/23/2022 11:05 AM    [facility]

## 2022-07-25 DIAGNOSIS — Z7901 Long term (current) use of anticoagulants: Secondary | ICD-10-CM | POA: Diagnosis not present

## 2022-08-01 DIAGNOSIS — R791 Abnormal coagulation profile: Secondary | ICD-10-CM | POA: Diagnosis not present

## 2022-08-02 ENCOUNTER — Ambulatory Visit: Payer: Medicare Other | Attending: Cardiology

## 2022-08-02 DIAGNOSIS — R0609 Other forms of dyspnea: Secondary | ICD-10-CM

## 2022-08-02 LAB — ECHOCARDIOGRAM COMPLETE
AR max vel: 1.06 cm2
AV Area VTI: 1.1 cm2
AV Area mean vel: 1.04 cm2
AV Mean grad: 16.3 mmHg
AV Peak grad: 30.8 mmHg
Ao pk vel: 2.78 m/s
Area-P 1/2: 2.6 cm2
S' Lateral: 5.6 cm

## 2022-08-08 ENCOUNTER — Telehealth: Payer: Self-pay

## 2022-08-08 DIAGNOSIS — R791 Abnormal coagulation profile: Secondary | ICD-10-CM | POA: Diagnosis not present

## 2022-08-08 NOTE — Patient Outreach (Signed)
  Care Coordination   Initial Visit Note   08/08/2022 Name: Shannon Baxter MRN: [id number] DOB: [birth year]  Shannon Baxter is a 61 y.o. year old female who sees Nicoletta Dress, MD for primary care. I spoke with  Larey Brick by phone today.  What matters to the patients health and wellness today?  Placed call to patient and reviewed and offered [facility] care coordination program.  Patient reports that she is doing well. Reviewed with patient the need for an AWV and encouraged patient to call the office to schedule.      SDOH assessments and interventions completed:  No     Care Coordination Interventions Activated:  No  Care Coordination Interventions:  No, not indicated   Follow up plan: No further intervention required.   Encounter Outcome:  Pt. Refused   Tomasa Rand, RN, BSN, CEN [facility] [employer] [phone]

## 2022-08-13 ENCOUNTER — Ambulatory Visit: Payer: Medicare Other | Admitting: Psychiatry

## 2022-08-13 ENCOUNTER — Telehealth: Payer: Self-pay

## 2022-08-13 NOTE — Telephone Encounter (Signed)
Unable to leave message VM full. 

## 2022-08-15 DIAGNOSIS — R791 Abnormal coagulation profile: Secondary | ICD-10-CM | POA: Diagnosis not present

## 2022-08-16 ENCOUNTER — Telehealth: Payer: Self-pay

## 2022-08-16 DIAGNOSIS — I5032 Chronic diastolic (congestive) heart failure: Secondary | ICD-10-CM | POA: Diagnosis not present

## 2022-08-16 NOTE — Telephone Encounter (Signed)
Patient notified of results and recommendation and agreed with plan. She will stop by the office soon. Lab order on file

## 2022-08-16 NOTE — Telephone Encounter (Signed)
-----   Message from Park Liter, MD sent at 08/09/2022  8:06 PM EDT ----- Ejection fraction decreased to 30 to 35%, lets check Chem-7 and proBNP to decide what to do next

## 2022-08-17 LAB — BASIC METABOLIC PANEL
BUN/Creatinine Ratio: 13 (ref 12–28)
BUN: 12 mg/dL (ref 8–27)
CO2: 23 mmol/L (ref 20–29)
Calcium: 8.6 mg/dL — ABNORMAL LOW (ref 8.7–10.3)
Chloride: 105 mmol/L (ref 96–106)
Creatinine, Ser: 0.94 mg/dL (ref 0.57–1.00)
Glucose: 73 mg/dL (ref 70–99)
Potassium: 4 mmol/L (ref 3.5–5.2)
Sodium: 143 mmol/L (ref 134–144)
eGFR: 69 mL/min/{1.73_m2} (ref >59)

## 2022-08-17 LAB — PRO B NATRIURETIC PEPTIDE: NT-Pro BNP: 523 pg/mL — ABNORMAL HIGH (ref 0–287)

## 2022-08-20 ENCOUNTER — Telehealth: Payer: Self-pay | Admitting: Cardiology

## 2022-08-20 NOTE — Telephone Encounter (Signed)
Pt stated that you advised her to report any increased HR. On 08-19-22 between 4-5 AM she had a HR of 128,felt a little light headed. Her rate went back to normal between 70-80. She stated that she has felt fine today.

## 2022-08-20 NOTE — Telephone Encounter (Signed)
LVM to call back regarding message 

## 2022-08-20 NOTE — Telephone Encounter (Signed)
STAT if HR is under 50 or over 120 (normal HR is 60-100 beats per minute)  What is your heart rate? 82  Do you have a log of your heart rate readings (document readings)? 10/29 4:30-5:00 am HR 128  Do you have any other symptoms? Lightheaded    Woke up lightheaded with elevated HR Sunday morning.

## 2022-08-21 NOTE — Progress Notes (Unsigned)
   CC:  headaches  Follow-up Visit  Last visit: 07/19/21  Brief HPI: 61 year old female with a history of aortic stenosis s/p mechanical AVR on coumadin, dilated cardiomyopathy, aflutter s/p ablation and ICD, PVD, CAD, COPD, HTN, HLD, left putamen infarct 2008 who follows in clinic for chronic migraines.  At her last visit she was started on Emgality for migraine prevention. CTH was ordered.  Interval History: Headaches***Emgality***  [facility] 07/31/21 was unremarkable.  Headache days per month: *** Migraine days per month*** Headache free days per month: ***  Current Headache Regimen: Preventative: *** Abortive: ***   Prior Therapies                                  Gabapentin 300 mg TID Carvedilol 25 mg BID Valsartan 51 mg BID  Physical Exam:   Vital Signs: There were no vitals taken for this visit. GENERAL:  well appearing, in no acute distress, alert  SKIN:  Color, texture, turgor normal. No rashes or lesions HEAD:  Normocephalic/atraumatic. RESP: normal respiratory effort MSK:  No gross joint deformities.   NEUROLOGICAL: Mental Status: Alert, oriented to person, place and time, Follows commands, and Speech fluent and appropriate. Cranial Nerves: PERRL, face symmetric, no dysarthria, hearing grossly intact Motor: moves all extremities equally Gait: normal-based.  IMPRESSION: ***  PLAN: ***   Follow-up: ***  I spent a total of *** minutes on the date of the service. Headache education was done. Discussed lifestyle modification including increased oral hydration, decreased caffeine, exercise and stress management. Discussed treatment options including preventive and acute medications, natural supplements, and infusion therapy. Discussed medication overuse headache and to limit use of acute treatments to no more than 2 days/week or 10 days/month. Discussed medication side effects, adverse reactions and drug interactions. Written educational materials and patient  instructions outlining all of the above were given.  Genia Harold, MD

## 2022-08-22 ENCOUNTER — Encounter: Payer: Self-pay | Admitting: Psychiatry

## 2022-08-22 ENCOUNTER — Telehealth: Payer: Self-pay | Admitting: *Deleted

## 2022-08-22 ENCOUNTER — Ambulatory Visit (INDEPENDENT_AMBULATORY_CARE_PROVIDER_SITE_OTHER): Payer: Medicare Other | Admitting: Psychiatry

## 2022-08-22 ENCOUNTER — Telehealth: Payer: Self-pay

## 2022-08-22 VITALS — BP 118/68 | HR 72 | Ht 72.0 in | Wt 337.6 lb

## 2022-08-22 DIAGNOSIS — G43E19 Chronic migraine with aura, intractable, without status migrainosus: Secondary | ICD-10-CM

## 2022-08-22 MED ORDER — EMGALITY 120 MG/ML ~~LOC~~ SOAJ: 120.0000 mg | SUBCUTANEOUS | 11 refills | Status: DC

## 2022-08-22 MED ORDER — UBRELVY 50 MG PO TABS: 50.0000 mg | ORAL_TABLET | ORAL | 6 refills | Status: DC | PRN

## 2022-08-22 NOTE — Telephone Encounter (Signed)
Roselyn Meier PA, Key: [id number], [phone] Your information has been sent to [facility].

## 2022-08-22 NOTE — Telephone Encounter (Signed)
Spoke with pt regarding increased HR and Dr. Wendy Poet reply to call if she has any other episodes. Pt agreed and verbalized understanding. She had no other questions.

## 2022-08-22 NOTE — Telephone Encounter (Signed)
UBRELVY TAB 50MG , use as directed, is approved through 10/22/2023 under your Medicare Part D benefit.

## 2022-08-28 ENCOUNTER — Ambulatory Visit (INDEPENDENT_AMBULATORY_CARE_PROVIDER_SITE_OTHER): Payer: Medicare Other

## 2022-08-28 ENCOUNTER — Telehealth: Payer: Self-pay

## 2022-08-28 DIAGNOSIS — I420 Dilated cardiomyopathy: Secondary | ICD-10-CM | POA: Diagnosis not present

## 2022-08-28 DIAGNOSIS — Z95810 Presence of automatic (implantable) cardiac defibrillator: Secondary | ICD-10-CM | POA: Diagnosis not present

## 2022-08-28 LAB — CUP PACEART REMOTE DEVICE CHECK
Battery Remaining Longevity: 49 mo
Battery Voltage: 2.96 V
Brady Statistic AP VP Percent: 0.01 %
Brady Statistic AP VS Percent: 2.74 %
Brady Statistic AS VP Percent: 0.04 %
Brady Statistic AS VS Percent: 97.21 %
Brady Statistic RA Percent Paced: 2.74 %
Brady Statistic RV Percent Paced: 0.05 %
Date Time Interrogation Session: 20231107044223
HighPow Impedance: 74 Ohm
Implantable Lead Connection Status: 753985
Implantable Lead Connection Status: 753985
Implantable Lead Implant Date: 20180102
Implantable Lead Implant Date: 20180102
Implantable Lead Location: 753859
Implantable Lead Location: 753860
Implantable Lead Model: 5076
Implantable Pulse Generator Implant Date: 20180102
Lead Channel Impedance Value: 323 Ohm
Lead Channel Impedance Value: 399 Ohm
Lead Channel Impedance Value: 456 Ohm
Lead Channel Pacing Threshold Amplitude: 0.375 V
Lead Channel Pacing Threshold Amplitude: 0.875 V
Lead Channel Pacing Threshold Pulse Width: 0.4 ms
Lead Channel Pacing Threshold Pulse Width: 0.4 ms
Lead Channel Sensing Intrinsic Amplitude: 24.625 mV
Lead Channel Sensing Intrinsic Amplitude: 24.625 mV
Lead Channel Sensing Intrinsic Amplitude: 3.75 mV
Lead Channel Sensing Intrinsic Amplitude: 3.75 mV
Lead Channel Setting Pacing Amplitude: 1.5 V
Lead Channel Setting Pacing Amplitude: 2.25 V
Lead Channel Setting Pacing Pulse Width: 0.4 ms
Lead Channel Setting Sensing Sensitivity: 0.3 mV
Zone Setting Status: 755011
Zone Setting Status: 755011

## 2022-08-28 NOTE — Telephone Encounter (Signed)
Results reviewed with pt as per Dr. Krasowski's note.  Pt verbalized understanding and had no additional questions. Routed to PCP  

## 2022-08-29 DIAGNOSIS — R791 Abnormal coagulation profile: Secondary | ICD-10-CM | POA: Diagnosis not present

## 2022-09-03 DIAGNOSIS — Z122 Encounter for screening for malignant neoplasm of respiratory organs: Secondary | ICD-10-CM | POA: Diagnosis not present

## 2022-09-03 DIAGNOSIS — Z87891 Personal history of nicotine dependence: Secondary | ICD-10-CM | POA: Diagnosis not present

## 2022-09-12 DIAGNOSIS — R791 Abnormal coagulation profile: Secondary | ICD-10-CM | POA: Diagnosis not present

## 2022-09-20 ENCOUNTER — Ambulatory Visit (INDEPENDENT_AMBULATORY_CARE_PROVIDER_SITE_OTHER): Payer: Medicare Other | Admitting: Podiatry

## 2022-09-20 ENCOUNTER — Encounter: Payer: Self-pay | Admitting: Podiatry

## 2022-09-20 DIAGNOSIS — M79675 Pain in left toe(s): Secondary | ICD-10-CM

## 2022-09-20 DIAGNOSIS — R791 Abnormal coagulation profile: Secondary | ICD-10-CM | POA: Diagnosis not present

## 2022-09-20 DIAGNOSIS — M2040 Other hammer toe(s) (acquired), unspecified foot: Secondary | ICD-10-CM

## 2022-09-20 DIAGNOSIS — M2142 Flat foot [pes planus] (acquired), left foot: Secondary | ICD-10-CM | POA: Diagnosis not present

## 2022-09-20 DIAGNOSIS — M79674 Pain in right toe(s): Secondary | ICD-10-CM | POA: Diagnosis not present

## 2022-09-20 DIAGNOSIS — E1151 Type 2 diabetes mellitus with diabetic peripheral angiopathy without gangrene: Secondary | ICD-10-CM

## 2022-09-20 DIAGNOSIS — E119 Type 2 diabetes mellitus without complications: Secondary | ICD-10-CM | POA: Diagnosis not present

## 2022-09-20 DIAGNOSIS — M2141 Flat foot [pes planus] (acquired), right foot: Secondary | ICD-10-CM | POA: Diagnosis not present

## 2022-09-20 DIAGNOSIS — B351 Tinea unguium: Secondary | ICD-10-CM

## 2022-09-23 NOTE — Progress Notes (Signed)
ANNUAL DIABETIC FOOT EXAM  Subjective: Shannon Baxter presents today for annual diabetic foot examination.  Chief Complaint  Patient presents with   Nail Problem    Diabetic foot care BS-did not check A1C-5.8 PCP-Douglas Delena Bali PCP VST-07/2022   Patient relates 10 year h/o diabetes.  Patient denies any h/o foot wounds.  Patient has been diagnosed with neuropathy and it is managed with gabapentin.  Risk factors: diabetes, PAD with intermittent claudication, h/o CVA, HTN, CAD, CHF, dyslipidemia, h/o tobacco use in remission.  Nicoletta Dress, MD is patient's PCP. Last visit was June 20, 2022.  Past Medical History:  Diagnosis Date   Acute on chronic systolic heart failure ([place]) 02/21/2016   Allergy    Aortic insufficiency    Aortic stenosis    Arthritis    Asthma    Atherosclerosis of native arteries of extremities with intermittent claudication, bilateral legs ([place]) 08/04/2015   Carotid atherosclerosis 07/26/2015   CHF (congestive heart failure) (HCC)    Chronic systolic congestive heart failure ([place]) 12/06/2016   Claudication ([place])    Congestive heart failure (CHF) ([place])    Coronary artery disease    Coronary artery disease involving native coronary artery of native heart without angina pectoris 07/26/2015   Diabetes mellitus without complication ([place])    Dilated cardiomyopathy ([place]) 09/03/2016   Overview:  Ejection fraction 20-25%   Dyslipidemia 07/26/2015   Dyspnea on exertion 10/26/2015   Essential hypertension 07/26/2015   GERD (gastroesophageal reflux disease)    Heart murmur    History of mechanical aortic valve replacement 03/29/2016   Overview:  May 2017. St Judes 23 mm  Formatting of this note might be different from the original. Overview:  May 2017. St Judes 23 mm Formatting of this note might be different from the original. May 2017. St Judes 23 mm   History of tobacco abuse 07/26/2015   Hyperlipidemia    Hypertension    Hypothyroidism 01/23/2016    Intermittent claudication ([place]) 07/26/2015   Morbid obesity ([place]) 12/06/2016   Nutritional and metabolic cardiomyopathy (HCC)    Paroxysmal atrial fibrillation ([place]) 03/06/2016   Paroxysmal atrial flutter ([place]) 09/01/2018   Peripheral vascular disease ([place]) 07/26/2015   Presence of automatic implantable cardioverter-defibrillator 10/23/2016   PVD (peripheral vascular disease) ([place])    Rheumatic aortic valve insufficiency 03/05/2016   Sleep apnea    Stroke ([place])    Type 2 diabetes mellitus with diabetic peripheral angiopathy without gangrene, without long-term current use of insulin ([place]) 11/22/2015   Patient Active Problem List   Diagnosis Date Noted   CHF (congestive heart failure) ([place])    Stroke ([place])    Sleep apnea    PVD (peripheral vascular disease) ([place])    Nutritional and metabolic cardiomyopathy ([place])    Hypertension    Hyperlipidemia    Heart murmur    GERD (gastroesophageal reflux disease)    Diabetes mellitus without complication (HCC)    Claudication (HCC)    Congestive heart failure (CHF) (HCC)    Asthma    Arthritis    Aortic stenosis    Aortic insufficiency    Allergy    Coronary artery disease    Paroxysmal atrial flutter ([place]) [id number]   Chronic systolic congestive heart failure ([place]) 12/06/2016   Morbid obesity ([place]) 12/06/2016   Presence of automatic implantable cardioverter-defibrillator 10/23/2016   Dilated cardiomyopathy ([place]) 09/03/2016   History of mechanical aortic valve replacement 03/29/2016   Paroxysmal atrial fibrillation ([place]) 03/06/2016   Rheumatic aortic valve insufficiency  03/05/2016   Acute on chronic systolic heart failure (HCC) 02/21/2016   Hypothyroidism 01/23/2016   Type 2 diabetes mellitus with diabetic peripheral angiopathy without gangrene, without long-term current use of insulin (HCC) 11/22/2015   Dyspnea on exertion 10/26/2015   Atherosclerosis of native arteries of extremities with intermittent claudication, bilateral legs  (HCC) 08/04/2015   Carotid atherosclerosis 07/26/2015   Coronary artery disease involving native coronary artery of native heart without angina pectoris 07/26/2015   Dyslipidemia 07/26/2015   Essential hypertension 07/26/2015   Intermittent claudication (HCC) 07/26/2015   Peripheral vascular disease (HCC) 07/26/2015   History of tobacco abuse 07/26/2015   Past Surgical History:  Procedure Laterality Date   A-FLUTTER ABLATION N/A 11/05/2018   Procedure: A-FLUTTER ABLATION;  Surgeon: Regan Lemming, MD;  Location: [facility];  Service: Cardiovascular;  Laterality: N/A;   CARDIAC CATHETERIZATION     INSERT / REPLACE / REMOVE PACEMAKER     Medtronic ICD   MECHANICAL AORTIC VALVE REPLACEMENT     PERIPHERAL ARTERIAL STENT GRAFT     TUBAL LIGATION     Current Outpatient Medications on File Prior to Visit  Medication Sig Dispense Refill   albuterol (PROVENTIL HFA;VENTOLIN HFA) 108 (90 Base) MCG/ACT inhaler Inhale 1-2 puffs into the lungs every 6 (six) hours as needed for wheezing or shortness of breath. (Patient not taking: Reported on 08/22/2022)     ALPRAZolam (XANAX) 0.5 MG tablet Take 0.5 mg by mouth 3 (three) times daily as needed for anxiety.   0   amiodarone (PACERONE) 200 MG tablet Take 0.5 tablets (100 mg total) by mouth daily. 90 tablet 3   atorvastatin (LIPITOR) 80 MG tablet Take 80 mg by mouth daily.     carvedilol (COREG) 25 MG tablet TAKE 1 TABLET BY MOUTH TWICE A DAY (Patient taking differently: Take 25 mg by mouth 2 (two) times daily with a meal.) 180 tablet 3   clopidogrel (PLAVIX) 75 MG tablet Take 75 mg by mouth daily after breakfast.   6   COMBIVENT RESPIMAT 20-100 MCG/ACT AERS respimat Inhale 1 puff into the lungs every 6 (six) hours as needed for wheezing or shortness of breath. (Patient not taking: Reported on 08/22/2022)     Cyanocobalamin (B-12) 500 MCG TABS Take 3 tablets by mouth daily.     doxycycline (VIBRAMYCIN) 100 MG capsule Take 100 mg by mouth 2  (two) times daily.     ENTRESTO 49-51 MG TAKE 1 TABLET BY MOUTH TWICE A DAY 180 tablet 3   EPIPEN 2-PAK 0.3 MG/0.3ML SOAJ injection Inject 0.3 mg into the muscle once.   0   ezetimibe (ZETIA) 10 MG tablet Take 10 mg by mouth daily.     fluticasone (FLONASE) 50 MCG/ACT nasal spray Place 2 sprays into both nostrils daily as needed for allergies.      furosemide (LASIX) 20 MG tablet TAKE 3 TABLETS BY MOUTH 2 TIMES DAILY. (Patient taking differently: Take 60 mg by mouth 2 (two) times daily.) 540 tablet 2   gabapentin (NEURONTIN) 300 MG capsule Take 300 mg by mouth 3 (three) times daily.   5   Galcanezumab-gnlm (EMGALITY) 120 MG/ML SOAJ Inject 120 mg into the skin every 30 (thirty) days. 1.12 mL 11   glimepiride (AMARYL) 4 MG tablet Take 4 mg by mouth daily with breakfast.      hydrALAZINE (APRESOLINE) 25 MG tablet Take 25 mg by mouth 3 (three) times daily.     HYDROcodone-acetaminophen (NORCO) 10-325 MG tablet Take 1  tablet by mouth 4 (four) times daily as needed for moderate pain or severe pain.     levothyroxine (SYNTHROID) 100 MCG tablet Take 100 mcg by mouth daily.     linaclotide (LINZESS) 72 MCG capsule Take 72 mcg by mouth daily as needed (constipation).     loratadine (CLARITIN) 10 MG tablet Take 10 mg by mouth at bedtime.      metFORMIN (GLUCOPHAGE) 500 MG tablet Take 500 mg by mouth 2 (two) times daily with a meal.      montelukast (SINGULAIR) 10 MG tablet Take 10 mg by mouth at bedtime.      Multiple Vitamins-Minerals (CENTRUM SILVER ULTRA WOMENS) TABS Take 1 tablet by mouth daily. Unknown strength     nystatin (MYCOSTATIN/NYSTOP) powder Apply 1 application topically 3 (three) times daily. Unknown strength     omeprazole (PRILOSEC) 20 MG capsule Take 20 mg by mouth at bedtime.      ondansetron (ZOFRAN-ODT) 4 MG disintegrating tablet Take 4 mg by mouth every 6 (six) hours.     Potassium Chloride ER 20 MEQ TBCR TAKE 1 TABLET BY MOUTH EVERY DAY (Patient taking differently: Take 1 tablet by  mouth daily.) 90 tablet 2   predniSONE (DELTASONE) 10 MG tablet Take 10 mg by mouth daily with breakfast.     Red Yeast Rice 600 MG TABS Take 2 tablets by mouth daily.     rosuvastatin (CRESTOR) 20 MG tablet TAKE 1 TABLET BY MOUTH EVERY DAY (Patient taking differently: Take 20 mg by mouth daily.) 90 tablet 2   TRELEGY ELLIPTA 100-62.5-25 MCG/ACT AEPB Inhale 1 puff into the lungs daily.     TRELEGY ELLIPTA 200-62.5-25 MCG/ACT AEPB Inhale 1 puff into the lungs daily.     Ubrogepant (UBRELVY) 50 MG TABS Take 50 mg by mouth as needed (migraine). May repeat a dose in 2 hours if needed. Max dose 2 pills in 24 hours 16 tablet 6   warfarin (COUMADIN) 4 MG tablet Take 4 mg by mouth daily.     warfarin (COUMADIN) 5 MG tablet Take 5 mg by mouth See admin instructions. Take 6 mg on Sunday, Tuesday, and  Thursday . Take 5 mg on all other days     No current facility-administered medications on file prior to visit.    Allergies  Allergen Reactions   Iodinated Contrast Media Hives and Rash   Ioxaglate Hives   Social History   Occupational History   Not on file  Tobacco Use   Smoking status: Former   Smokeless tobacco: Never  Vaping Use   Vaping Use: Never used  Substance and Sexual Activity   Alcohol use: No   Drug use: No   Sexual activity: Not on file   Family History  Problem Relation Age of Onset   Heart disease Brother    Immunization History  Administered Date(s) Administered   Influenza Inj Mdck Quad Pf 07/11/2018   Influenza,inj,Quad PF,6+ Mos 07/23/2017   Influenza-Unspecified 07/31/2016     Review of Systems: Negative except as noted in the HPI.   Objective: There were no vitals filed for this visit.  Nandita Lightle is a pleasant 62 y.o. female in NAD. AAO X 3.  Vascular Examination: CFT <3 seconds b/l. DP pulses faintly palpable b/l. PT pulses nonpalpable b/l. Digital hair absent. Skin temperature gradient warm to warm b/l. No pain with calf compression. No ischemia or  gangrene. No cyanosis or clubbing noted b/l.    Neurological Examination: Sensation grossly intact b/l with  10 gram monofilament. Vibratory sensation intact b/l.   Dermatological Examination: Pedal skin warm and supple b/l. No open wounds b/l. No interdigital macerations. Toenails 1-5 b/l thick, discolored, elongated with subungual debris and pain on dorsal palpation.  No open wounds b/l LE. No interdigital macerations noted b/l LE.  Musculoskeletal Examination: Muscle strength 5/5 to b/l LE. No pain, crepitus or joint limitation noted with ROM bilateral LE. Pes planus deformity noted bilateral LE.  Radiographs: None  Footwear Assessment: Does the patient wear appropriate shoes? Yes. Does the patient need inserts/orthotics? Yes.  ADA Risk Categorization: High Risk  Patient has one or more of the following: Loss of protective sensation Absent pedal pulses Severe Foot deformity History of foot ulcer  Assessment: 1. Pain due to onychomycosis of toenails of both feet   2. Pes planus of both feet   3. Hammer toe, unspecified laterality   4. Type II diabetes mellitus with peripheral circulatory disorder (HCC)   5. Encounter for diabetic foot exam ([place])     Plan: No orders of the defined types were placed in this encounter.  -Consent given for treatment as described below: -Examined patient. -Diabetic foot examination performed today. -Continue diabetic foot care principles: inspect feet daily, monitor glucose as recommended by PCP and/or Endocrinologist, and follow prescribed diet per PCP, Endocrinologist and/or dietician. -Continue supportive shoe gear daily. -Patient/POA to call should there be question/concern in the interim. Return in about 3 months (around 12/20/2022).  Marzetta Board, DPM

## 2022-09-24 NOTE — Progress Notes (Signed)
Remote ICD transmission.   

## 2022-09-27 DIAGNOSIS — R791 Abnormal coagulation profile: Secondary | ICD-10-CM | POA: Diagnosis not present

## 2022-09-27 DIAGNOSIS — G4733 Obstructive sleep apnea (adult) (pediatric): Secondary | ICD-10-CM | POA: Diagnosis not present

## 2022-09-27 DIAGNOSIS — J301 Allergic rhinitis due to pollen: Secondary | ICD-10-CM | POA: Diagnosis not present

## 2022-09-27 DIAGNOSIS — J4541 Moderate persistent asthma with (acute) exacerbation: Secondary | ICD-10-CM | POA: Diagnosis not present

## 2022-09-27 DIAGNOSIS — J069 Acute upper respiratory infection, unspecified: Secondary | ICD-10-CM | POA: Diagnosis not present

## 2022-09-28 DIAGNOSIS — G4733 Obstructive sleep apnea (adult) (pediatric): Secondary | ICD-10-CM | POA: Diagnosis not present

## 2022-10-08 DIAGNOSIS — H524 Presbyopia: Secondary | ICD-10-CM | POA: Diagnosis not present

## 2022-10-08 DIAGNOSIS — E113392 Type 2 diabetes mellitus with moderate nonproliferative diabetic retinopathy without macular edema, left eye: Secondary | ICD-10-CM | POA: Diagnosis not present

## 2022-10-10 DIAGNOSIS — R051 Acute cough: Secondary | ICD-10-CM | POA: Diagnosis not present

## 2022-10-10 DIAGNOSIS — R791 Abnormal coagulation profile: Secondary | ICD-10-CM | POA: Diagnosis not present

## 2022-10-10 DIAGNOSIS — J4540 Moderate persistent asthma, uncomplicated: Secondary | ICD-10-CM | POA: Diagnosis not present

## 2022-10-16 DIAGNOSIS — H5213 Myopia, bilateral: Secondary | ICD-10-CM | POA: Diagnosis not present

## 2022-11-27 ENCOUNTER — Ambulatory Visit: Payer: 59

## 2022-11-27 DIAGNOSIS — I420 Dilated cardiomyopathy: Secondary | ICD-10-CM | POA: Diagnosis not present

## 2022-11-27 LAB — CUP PACEART REMOTE DEVICE CHECK
Battery Remaining Longevity: 47 mo
Battery Voltage: 2.96 V
Brady Statistic AP VP Percent: 0 %
Brady Statistic AP VS Percent: 1.79 %
Brady Statistic AS VP Percent: 0.04 %
Brady Statistic AS VS Percent: 98.16 %
Brady Statistic RA Percent Paced: 1.8 %
Brady Statistic RV Percent Paced: 0.05 %
Date Time Interrogation Session: 20240206052825
HighPow Impedance: 71 Ohm
Implantable Lead Connection Status: 753985
Implantable Lead Connection Status: 753985
Implantable Lead Implant Date: 20180102
Implantable Lead Implant Date: 20180102
Implantable Lead Location: 753859
Implantable Lead Location: 753860
Implantable Lead Model: 5076
Implantable Pulse Generator Implant Date: 20180102
Lead Channel Impedance Value: 323 Ohm
Lead Channel Impedance Value: 380 Ohm
Lead Channel Impedance Value: 437 Ohm
Lead Channel Pacing Threshold Amplitude: 0.375 V
Lead Channel Pacing Threshold Amplitude: 1.125 V
Lead Channel Pacing Threshold Pulse Width: 0.4 ms
Lead Channel Pacing Threshold Pulse Width: 0.4 ms
Lead Channel Sensing Intrinsic Amplitude: 26.125 mV
Lead Channel Sensing Intrinsic Amplitude: 26.125 mV
Lead Channel Sensing Intrinsic Amplitude: 3.625 mV
Lead Channel Sensing Intrinsic Amplitude: 3.625 mV
Lead Channel Setting Pacing Amplitude: 1.5 V
Lead Channel Setting Pacing Amplitude: 2.25 V
Lead Channel Setting Pacing Pulse Width: 0.4 ms
Lead Channel Setting Sensing Sensitivity: 0.3 mV
Zone Setting Status: 755011
Zone Setting Status: 755011

## 2022-12-03 LAB — COLOGUARD: Cologuard: NEGATIVE

## 2022-12-27 ENCOUNTER — Ambulatory Visit: Payer: 59 | Attending: Cardiology | Admitting: Cardiology

## 2022-12-27 ENCOUNTER — Encounter: Payer: Self-pay | Admitting: Cardiology

## 2022-12-27 VITALS — BP 118/72 | HR 78 | Ht 72.0 in | Wt 337.0 lb

## 2022-12-27 DIAGNOSIS — E782 Mixed hyperlipidemia: Secondary | ICD-10-CM

## 2022-12-27 DIAGNOSIS — Z952 Presence of prosthetic heart valve: Secondary | ICD-10-CM

## 2022-12-27 DIAGNOSIS — I2510 Atherosclerotic heart disease of native coronary artery without angina pectoris: Secondary | ICD-10-CM

## 2022-12-27 DIAGNOSIS — R791 Abnormal coagulation profile: Secondary | ICD-10-CM | POA: Diagnosis not present

## 2022-12-27 DIAGNOSIS — Z95810 Presence of automatic (implantable) cardiac defibrillator: Secondary | ICD-10-CM | POA: Diagnosis not present

## 2022-12-27 DIAGNOSIS — E1151 Type 2 diabetes mellitus with diabetic peripheral angiopathy without gangrene: Secondary | ICD-10-CM | POA: Diagnosis not present

## 2022-12-27 DIAGNOSIS — E785 Hyperlipidemia, unspecified: Secondary | ICD-10-CM

## 2022-12-27 DIAGNOSIS — I5032 Chronic diastolic (congestive) heart failure: Secondary | ICD-10-CM

## 2022-12-27 NOTE — Progress Notes (Signed)
Cardiology Office Note:    Date:  12/27/2022   ID:  Shannon Baxter, DOB Feb 28, 1961, MRN [id number]  PCP:  Nicoletta Dress, MD  Cardiologist:  Jenne Campus, MD    Referring MD: Nicoletta Dress, MD   Chief Complaint  Patient presents with   Follow-up    History of Present Illness:    Shannon Baxter is a 62 y.o. female with past medical history significant for arctic valve replacement which is essentially with prosthesis 23 mm in [facility], paroxysmal atrial fibrillation/atrial flutter, cardiomyopathy with ejection fraction 40 to 45% however latest echocardiogram showed deterioration of left ventricle ejection fraction to about 35%, she does have BiV ICD, essential hypertension, diabetes, dyslipidemia, morbid obesity. Comes today to months for follow-up.  Overall she says she is doing better.  Still some shortness of breath but nothing extraordinary.  Swelling of lower extremities when she is standing for long time.  Denies have any chest pain tightness squeezing pressure burning chest  Past Medical History:  Diagnosis Date   Acute on chronic systolic heart failure ([place]) 02/21/2016   Allergy    Aortic insufficiency    Aortic stenosis    Arthritis    Asthma    Atherosclerosis of native arteries of extremities with intermittent claudication, bilateral legs ([place]) 08/04/2015   Carotid atherosclerosis 07/26/2015   CHF (congestive heart failure) (HCC)    Chronic systolic congestive heart failure ([place]) 12/06/2016   Claudication ([place])    Congestive heart failure (CHF) ([place])    Coronary artery disease    Coronary artery disease involving native coronary artery of native heart without angina pectoris 07/26/2015   Diabetes mellitus without complication ([place])    Dilated cardiomyopathy ([place]) 09/03/2016   Overview:  Ejection fraction 20-25%   Dyslipidemia 07/26/2015   Dyspnea on exertion 10/26/2015   Essential hypertension 07/26/2015   GERD (gastroesophageal reflux disease)    Heart murmur     History of mechanical aortic valve replacement 03/29/2016   Overview:  May 2017. St Judes 23 mm  Formatting of this note might be different from the original. Overview:  May 2017. St Judes 23 mm Formatting of this note might be different from the original. May 2017. St Judes 23 mm   History of tobacco abuse 07/26/2015   Hyperlipidemia    Hypertension    Hypothyroidism 01/23/2016   Intermittent claudication ([place]) 07/26/2015   Morbid obesity ([place]) 12/06/2016   Nutritional and metabolic cardiomyopathy (HCC)    Paroxysmal atrial fibrillation ([place]) 03/06/2016   Paroxysmal atrial flutter ([place]) 09/01/2018   Peripheral vascular disease ([place]) 07/26/2015   Presence of automatic implantable cardioverter-defibrillator 10/23/2016   PVD (peripheral vascular disease) ([place])    Rheumatic aortic valve insufficiency 03/05/2016   Sleep apnea    Stroke ([place])    Type 2 diabetes mellitus with diabetic peripheral angiopathy without gangrene, without long-term current use of insulin ([place]) 11/22/2015    Past Surgical History:  Procedure Laterality Date   A-FLUTTER ABLATION N/A 11/05/2018   Procedure: A-FLUTTER ABLATION;  Surgeon: Constance Haw, MD;  Location: [facility];  Service: Cardiovascular;  Laterality: N/A;   CARDIAC CATHETERIZATION     INSERT / REPLACE / REMOVE PACEMAKER     Medtronic ICD   MECHANICAL AORTIC VALVE REPLACEMENT     PERIPHERAL ARTERIAL STENT GRAFT     TUBAL LIGATION      Current Medications: Current Meds  Medication Sig   albuterol (PROVENTIL HFA;VENTOLIN HFA) 108 (90 Base) MCG/ACT inhaler Inhale 1-2 puffs  into the lungs every 6 (six) hours as needed for wheezing or shortness of breath.   ALPRAZolam (XANAX) 0.5 MG tablet Take 0.5 mg by mouth 3 (three) times daily as needed for anxiety.    amiodarone (PACERONE) 200 MG tablet Take 0.5 tablets (100 mg total) by mouth daily.   atorvastatin (LIPITOR) 80 MG tablet Take 80 mg by mouth daily.   carvedilol (COREG) 25 MG tablet  TAKE 1 TABLET BY MOUTH TWICE A DAY   clopidogrel (PLAVIX) 75 MG tablet Take 75 mg by mouth daily after breakfast.    Cyanocobalamin (B-12) 500 MCG TABS Take 3 tablets by mouth daily.   Docusate Sodium (DSS) 100 MG CAPS Take 100 mg by mouth daily.   doxycycline (VIBRAMYCIN) 100 MG capsule Take 100 mg by mouth 2 (two) times daily.   ENTRESTO 49-51 MG TAKE 1 TABLET BY MOUTH TWICE A DAY   EPIPEN 2-PAK 0.3 MG/0.3ML SOAJ injection Inject 0.3 mg into the muscle once.    ezetimibe (ZETIA) 10 MG tablet Take 10 mg by mouth daily.   fluticasone (FLONASE) 50 MCG/ACT nasal spray Place 2 sprays into both nostrils daily as needed for allergies.    furosemide (LASIX) 20 MG tablet TAKE 3 TABLETS BY MOUTH 2 TIMES DAILY.   gabapentin (NEURONTIN) 300 MG capsule Take 300 mg by mouth 3 (three) times daily.    Galcanezumab-gnlm (EMGALITY) 120 MG/ML SOAJ Inject 120 mg into the skin every 30 (thirty) days.   glimepiride (AMARYL) 4 MG tablet Take 4 mg by mouth daily with breakfast.    hydrALAZINE (APRESOLINE) 25 MG tablet Take 25 mg by mouth 3 (three) times daily.   HYDROcodone-acetaminophen (NORCO) 10-325 MG tablet Take 1 tablet by mouth 4 (four) times daily as needed for moderate pain or severe pain.   levothyroxine (SYNTHROID) 100 MCG tablet Take 100 mcg by mouth daily.   linaclotide (LINZESS) 72 MCG capsule Take 72 mcg by mouth daily as needed (constipation).   loratadine (CLARITIN) 10 MG tablet Take 10 mg by mouth at bedtime.    metFORMIN (GLUCOPHAGE) 500 MG tablet Take 500 mg by mouth 2 (two) times daily with a meal.    montelukast (SINGULAIR) 10 MG tablet Take 10 mg by mouth at bedtime.    Multiple Vitamins-Minerals (CENTRUM SILVER ULTRA WOMENS) TABS Take 1 tablet by mouth daily. Unknown strength   nystatin (MYCOSTATIN/NYSTOP) powder Apply 1 application topically 3 (three) times daily. Unknown strength   omeprazole (PRILOSEC) 20 MG capsule Take 20 mg by mouth at bedtime.    ondansetron (ZOFRAN-ODT) 4 MG  disintegrating tablet Take 4 mg by mouth every 6 (six) hours.   Potassium Chloride ER 20 MEQ TBCR TAKE 1 TABLET BY MOUTH EVERY DAY   predniSONE (DELTASONE) 10 MG tablet Take 10 mg by mouth daily with breakfast.   Red Yeast Rice 600 MG TABS Take 2 tablets by mouth daily.   rosuvastatin (CRESTOR) 20 MG tablet TAKE 1 TABLET BY MOUTH EVERY DAY   TRELEGY ELLIPTA 100-62.5-25 MCG/ACT AEPB Inhale 1 puff into the lungs daily.   TRELEGY ELLIPTA 200-62.5-25 MCG/ACT AEPB Inhale 1 puff into the lungs daily.   Ubrogepant (UBRELVY) 50 MG TABS Take 50 mg by mouth as needed (migraine). May repeat a dose in 2 hours if needed. Max dose 2 pills in 24 hours   warfarin (COUMADIN) 4 MG tablet Take 4 mg by mouth daily.   warfarin (COUMADIN) 5 MG tablet Take 5 mg by mouth See admin instructions. Take 6 mg on  Sunday, Tuesday, and  Thursday . Take 5 mg on all other days     Allergies:   Iodinated contrast media and Ioxaglate   Social History   Socioeconomic History   Marital status: Single    Spouse name: Not on file   Number of children: Not on file   Years of education: Not on file   Highest education level: Not on file  Occupational History   Not on file  Tobacco Use   Smoking status: Former   Smokeless tobacco: Never  Vaping Use   Vaping Use: Never used  Substance and Sexual Activity   Alcohol use: No   Drug use: No   Sexual activity: Not on file  Other Topics Concern   Not on file  Social History Narrative   Lives alone   Social Determinants of Health   Financial Resource Strain: Not on file  Food Insecurity: Not on file  Transportation Needs: No Transportation Needs (05/14/2019)   PRAPARE - Hydrologist (Medical): No    Lack of Transportation (Non-Medical): No  Physical Activity: Not on file  Stress: Not on file  Social Connections: Not on file     Family History: The patient's family history includes Heart disease in her brother. ROS:   Please see the  history of present illness.    All 14 point review of systems negative except as described per history of present illness  EKGs/Labs/Other Studies Reviewed:      Recent Labs: 03/21/2022: Hemoglobin 12.6; Platelets 220; TSH 3.600 08/16/2022: BUN 12; Creatinine, Ser 0.94; NT-Pro BNP 523; Potassium 4.0; Sodium 143  Recent Lipid Panel    Component Value Date/Time   CHOL 176 06/19/2021 1624   TRIG 108 06/19/2021 1624   HDL 48 06/19/2021 1624   CHOLHDL 3.7 06/19/2021 1624   CHOLHDL 4.3 07/04/2007 0815   VLDL 28 07/04/2007 0815   LDLCALC 108 (H) 06/19/2021 1624    Physical Exam:    VS:  BP 118/72 (BP Location: Left Arm, Patient Position: Sitting, Cuff Size: Large)   Pulse 78   Ht 6' (1.829 m)   Wt (!) 337 lb (152.9 kg)   SpO2 93%   BMI 45.71 kg/m     Wt Readings from Last 3 Encounters:  12/27/22 (!) 337 lb (152.9 kg)  08/22/22 (!) 337 lb 9.6 oz (153.1 kg)  07/23/22 (!) 338 lb (153.3 kg)     GEN:  Well nourished, well developed in no acute distress HEENT: Normal NECK: No JVD; No carotid bruits LYMPHATICS: No lymphadenopathy CARDIAC: RRR, no murmurs, no rubs, no gallops RESPIRATORY:  Clear to auscultation without rales, wheezing or rhonchi  ABDOMEN: Soft, non-tender, non-distended MUSCULOSKELETAL:  No edema; No deformity  SKIN: Warm and dry LOWER EXTREMITIES: no swelling NEUROLOGIC:  Alert and oriented x 3 PSYCHIATRIC:  Normal affect   ASSESSMENT:    1. History of mechanical aortic valve replacement   2. Presence of automatic implantable cardioverter-defibrillator   3. Mixed hyperlipidemia   4. Dyslipidemia   5. Type 2 diabetes mellitus with diabetic peripheral angiopathy without gangrene, without long-term current use of insulin ([place])   6. Chronic diastolic congestive heart failure ([place])   7. Coronary artery disease involving native coronary artery of native heart without angina pectoris    PLAN:    In order of problems listed above:  Status post aortic valve  replacement last echocardiogram showed normal functioning valve. ICD present it is a Medtronic device, did review interrogation.  OptiVol is stable. Mixed dyslipidemia I did review K PN which show me her LDL of 100 HDL 48.  There is some discrepancy about medications that I seen according to the list she takes Lipitor 80 and Crestor 20 she is telling me that that is what she does ask her when she gets home to call us and tell us if this is the case in this situation obviously one of the medication will be discontinued. Cardiomyopathy with worsening of left ventricle ejection fraction, she is guideline directed medical therapy she is on carvedilol 25 twice daily Entresto [id number], she is not on Aldactone I will check Chem-7 today to see if there is any important to give that medication.  She is on a higher hydralazine.  Again we will check Chem-7 if Chem-7 is fine we will SGLT2 blocker.   Medication Adjustments/Labs and Tests Ordered: Current medicines are reviewed at length with the patient today.  Concerns regarding medicines are outlined above.  No orders of the defined types were placed in this encounter.  Medication changes: No orders of the defined types were placed in this encounter.   Signed, Park Liter, MD, [facility] 12/27/2022 9:42 AM    [place]

## 2022-12-27 NOTE — Patient Instructions (Signed)
Medication Instructions:  Your physician recommends that you continue on your current medications as directed. Please refer to the Current Medication list given to you today.  *If you need a refill on your cardiac medications before your next appointment, please call your pharmacy*   Lab Work: CMP- today If you have labs (blood work) drawn today and your tests are completely normal, you will receive your results only by: [place] (if you have MyChart) OR A paper copy in the mail If you have any lab test that is abnormal or we need to change your treatment, we will call you to review the results.   Testing/Procedures: None Ordered   Follow-Up: At [facility], you and your health needs are our priority.  As part of our continuing mission to provide you with exceptional heart care, we have created designated Provider Care Teams.  These Care Teams include your primary Cardiologist (physician) and Advanced Practice Providers (APPs -  Physician Assistants and Nurse Practitioners) who all work together to provide you with the care you need, when you need it.  We recommend signing up for the patient portal called "MyChart".  Sign up information is provided on this After Visit Summary.  MyChart is used to connect with patients for Virtual Visits (Telemedicine).  Patients are able to view lab/test results, encounter notes, upcoming appointments, etc.  Non-urgent messages can be sent to your provider as well.   To learn more about what you can do with MyChart, go to [email or web address].    Your next appointment:   3 month(s)  The format for your next appointment:   In Person  Provider:   Jenne Campus, MD    Other Instructions NA

## 2022-12-27 NOTE — Addendum Note (Signed)
Addended by: Jacobo Forest D on: 12/27/2022 09:47 AM   Modules accepted: Orders

## 2022-12-28 ENCOUNTER — Telehealth: Payer: Self-pay

## 2022-12-28 NOTE — Telephone Encounter (Signed)
Pt called stating that she is not taking Atorvastatin. All of her other medications are correct.

## 2022-12-29 LAB — COMPREHENSIVE METABOLIC PANEL
ALT: 26 IU/L (ref 0–32)
AST: 22 IU/L (ref 0–40)
Albumin/Globulin Ratio: 1.1 — ABNORMAL LOW (ref 1.2–2.2)
Albumin: 3.7 g/dL — ABNORMAL LOW (ref 3.8–4.9)
Alkaline Phosphatase: 59 IU/L (ref 44–121)
BUN/Creatinine Ratio: 19 (ref 12–28)
BUN: 20 mg/dL (ref 8–27)
Bilirubin Total: <0.2 mg/dL (ref 0.0–1.2)
CO2: 20 mmol/L (ref 20–29)
Calcium: 8.5 mg/dL — ABNORMAL LOW (ref 8.7–10.3)
Chloride: 104 mmol/L (ref 96–106)
Creatinine, Ser: 1.03 mg/dL — ABNORMAL HIGH (ref 0.57–1.00)
Globulin, Total: 3.3 g/dL (ref 1.5–4.5)
Glucose: 85 mg/dL (ref 70–99)
Potassium: 4 mmol/L (ref 3.5–5.2)
Sodium: 148 mmol/L — ABNORMAL HIGH (ref 134–144)
Total Protein: 7 g/dL (ref 6.0–8.5)
eGFR: 62 mL/min/{1.73_m2} (ref >59)

## 2023-01-01 NOTE — Progress Notes (Signed)
Remote ICD transmission.   

## 2023-01-03 DIAGNOSIS — R791 Abnormal coagulation profile: Secondary | ICD-10-CM | POA: Diagnosis not present

## 2023-01-08 ENCOUNTER — Telehealth: Payer: Self-pay

## 2023-01-08 DIAGNOSIS — I10 Essential (primary) hypertension: Secondary | ICD-10-CM

## 2023-01-08 MED ORDER — SPIRONOLACTONE 25 MG PO TABS: 12.5000 mg | ORAL_TABLET | Freq: Every day | ORAL | 1 refills | Status: DC

## 2023-01-08 NOTE — Telephone Encounter (Signed)
-----   Message from Park Liter, MD sent at 01/03/2023  9:00 PM EDT ----- Please add Aldactone 12.5 daily, Chem-7 in 1 week

## 2023-01-08 NOTE — Telephone Encounter (Signed)
Patient notified of results and recommendations and agreed with plan. Medication sent. Lab order on file.

## 2023-01-10 ENCOUNTER — Ambulatory Visit: Payer: Medicare Other | Admitting: Podiatry

## 2023-01-16 ENCOUNTER — Ambulatory Visit (INDEPENDENT_AMBULATORY_CARE_PROVIDER_SITE_OTHER): Payer: 59 | Admitting: Podiatry

## 2023-01-16 ENCOUNTER — Encounter: Payer: Self-pay | Admitting: Podiatry

## 2023-01-16 DIAGNOSIS — M79674 Pain in right toe(s): Secondary | ICD-10-CM

## 2023-01-16 DIAGNOSIS — E1151 Type 2 diabetes mellitus with diabetic peripheral angiopathy without gangrene: Secondary | ICD-10-CM | POA: Diagnosis not present

## 2023-01-16 DIAGNOSIS — B351 Tinea unguium: Secondary | ICD-10-CM | POA: Diagnosis not present

## 2023-01-16 DIAGNOSIS — I10 Essential (primary) hypertension: Secondary | ICD-10-CM | POA: Diagnosis not present

## 2023-01-16 DIAGNOSIS — M79675 Pain in left toe(s): Secondary | ICD-10-CM

## 2023-01-16 NOTE — Progress Notes (Signed)
Subjective:  Patient ID: Shannon Baxter, female    DOB: [birth year],   MRN: [id number]  Chief Complaint  Patient presents with   diabetic foot care     62 y.o. female presents for concern of thickened elongated and painful nails that are difficult to trim. Requesting to have them trimmed today. Relates burning and tingling in their feet. Patient is diabetic and last A1c was  Lab Results  Component Value Date   HGBA1C  07/04/2007    6.1 (NOTE)   The ADA recommends the following therapeutic goals for glycemic   control related to Hgb A1C measurement:   Goal of Therapy:   < 7.0% Hgb A1C   Action Suggested:  > 8.0% Hgb A1C   Ref:  Diabetes Care, 22, Suppl. 1, 1999   .   PCP:  Nicoletta Dress, MD    . Denies any other pedal complaints. Denies n/v/f/c.   Past Medical History:  Diagnosis Date   Acute on chronic systolic heart failure ([place]) 02/21/2016   Allergy    Aortic insufficiency    Aortic stenosis    Arthritis    Asthma    Atherosclerosis of native arteries of extremities with intermittent claudication, bilateral legs ([place]) 08/04/2015   Carotid atherosclerosis 07/26/2015   CHF (congestive heart failure) (HCC)    Chronic systolic congestive heart failure ([place]) 12/06/2016   Claudication ([place])    Congestive heart failure (CHF) ([place])    Coronary artery disease    Coronary artery disease involving native coronary artery of native heart without angina pectoris 07/26/2015   Diabetes mellitus without complication ([place])    Dilated cardiomyopathy ([place]) 09/03/2016   Overview:  Ejection fraction 20-25%   Dyslipidemia 07/26/2015   Dyspnea on exertion 10/26/2015   Essential hypertension 07/26/2015   GERD (gastroesophageal reflux disease)    Heart murmur    History of mechanical aortic valve replacement 03/29/2016   Overview:  May 2017. [facility]es 23 mm  Formatting of this note might be different from the original. Overview:  May 2017. [facility] Judes 23 mm Formatting of this note might be different  from the original. May 2017. [facility] Judes 23 mm   History of tobacco abuse 07/26/2015   Hyperlipidemia    Hypertension    Hypothyroidism 01/23/2016   Intermittent claudication ([place]) 07/26/2015   Morbid obesity ([place]) 12/06/2016   Nutritional and metabolic cardiomyopathy (HCC)    Paroxysmal atrial fibrillation ([place]) 03/06/2016   Paroxysmal atrial flutter ([place]) 09/01/2018   Peripheral vascular disease ([place]) 07/26/2015   Presence of automatic implantable cardioverter-defibrillator 10/23/2016   PVD (peripheral vascular disease) ([place])    Rheumatic aortic valve insufficiency 03/05/2016   Sleep apnea    Stroke ([place])    Type 2 diabetes mellitus with diabetic peripheral angiopathy without gangrene, without long-term current use of insulin ([place]) 11/22/2015    Objective:  Physical Exam: Vascular: DP/PT pulses 1/4 bilateral. CFT <3 seconds. Absent hair growth on digits. Edema noted to bilateral lower extremities. Xerosis noted bilaterally.  Skin. No lacerations or abrasions bilateral feet. Nails 1-5 bilateral  are thickened discolored and elongated with subungual debris.  Musculoskeletal: MMT 5/5 bilateral lower extremities in DF, PF, Inversion and Eversion. Deceased ROM in DF of ankle joint.  Neurological: Sensation intact to light touch. Protective sensation diminished bilateral.    Assessment:   1. Pain due to onychomycosis of toenails of both feet   2. Type II diabetes mellitus with peripheral circulatory disorder (HCC)      Plan:  Patient was evaluated and treated and all questions answered. -Discussed and educated patient on diabetic foot care, especially with  regards to the vascular, neurological and musculoskeletal systems.  -Stressed the importance of good glycemic control and the detriment of not  controlling glucose levels in relation to the foot. -Discussed supportive shoes at all times and checking feet regularly.  -Mechanically debrided all nails 1-5 bilateral using sterile  nail nipper and filed with dremel without incident  -Answered all patient questions -Patient to return  in 3 months for at risk foot care -Patient advised to call the office if any problems or questions arise in the meantime.   Lorenda Peck, DPM

## 2023-01-17 DIAGNOSIS — R791 Abnormal coagulation profile: Secondary | ICD-10-CM | POA: Diagnosis not present

## 2023-01-17 LAB — BASIC METABOLIC PANEL
BUN/Creatinine Ratio: 20 (ref 12–28)
BUN: 22 mg/dL (ref 8–27)
CO2: 24 mmol/L (ref 20–29)
Calcium: 8.8 mg/dL (ref 8.7–10.3)
Chloride: 104 mmol/L (ref 96–106)
Creatinine, Ser: 1.1 mg/dL — ABNORMAL HIGH (ref 0.57–1.00)
Glucose: 59 mg/dL — ABNORMAL LOW (ref 70–99)
Potassium: 4.3 mmol/L (ref 3.5–5.2)
Sodium: 144 mmol/L (ref 134–144)
eGFR: 58 mL/min/{1.73_m2} — ABNORMAL LOW (ref >59)

## 2023-01-18 ENCOUNTER — Telehealth: Payer: Self-pay

## 2023-01-18 NOTE — Telephone Encounter (Signed)
Patient notified of results.

## 2023-01-18 NOTE — Telephone Encounter (Signed)
-----   Message from Park Liter, MD sent at 01/18/2023 12:44 PM EDT ----- Chem-7 looks good, continue present management

## 2023-01-23 DIAGNOSIS — R791 Abnormal coagulation profile: Secondary | ICD-10-CM | POA: Diagnosis not present

## 2023-02-07 DIAGNOSIS — R791 Abnormal coagulation profile: Secondary | ICD-10-CM | POA: Diagnosis not present

## 2023-02-09 ENCOUNTER — Other Ambulatory Visit: Payer: Self-pay | Admitting: Cardiology

## 2023-02-13 DIAGNOSIS — R791 Abnormal coagulation profile: Secondary | ICD-10-CM | POA: Diagnosis not present

## 2023-02-19 DIAGNOSIS — R791 Abnormal coagulation profile: Secondary | ICD-10-CM | POA: Diagnosis not present

## 2023-02-26 ENCOUNTER — Ambulatory Visit: Payer: 59

## 2023-02-26 DIAGNOSIS — I420 Dilated cardiomyopathy: Secondary | ICD-10-CM

## 2023-02-26 DIAGNOSIS — R791 Abnormal coagulation profile: Secondary | ICD-10-CM | POA: Diagnosis not present

## 2023-02-26 LAB — CUP PACEART REMOTE DEVICE CHECK
Battery Remaining Longevity: 40 mo
Battery Voltage: 2.95 V
Brady Statistic AP VP Percent: 0.01 %
Brady Statistic AP VS Percent: 1.54 %
Brady Statistic AS VP Percent: 0.04 %
Brady Statistic AS VS Percent: 98.41 %
Brady Statistic RA Percent Paced: 1.54 %
Brady Statistic RV Percent Paced: 0.05 %
Date Time Interrogation Session: 20240507002205
HighPow Impedance: 85 Ohm
Implantable Lead Connection Status: 753985
Implantable Lead Connection Status: 753985
Implantable Lead Implant Date: 20180102
Implantable Lead Implant Date: 20180102
Implantable Lead Location: 753859
Implantable Lead Location: 753860
Implantable Lead Model: 5076
Implantable Pulse Generator Implant Date: 20180102
Lead Channel Impedance Value: 323 Ohm
Lead Channel Impedance Value: 399 Ohm
Lead Channel Impedance Value: 494 Ohm
Lead Channel Pacing Threshold Amplitude: 0.375 V
Lead Channel Pacing Threshold Amplitude: 1.25 V
Lead Channel Pacing Threshold Pulse Width: 0.4 ms
Lead Channel Pacing Threshold Pulse Width: 0.4 ms
Lead Channel Sensing Intrinsic Amplitude: 21.75 mV
Lead Channel Sensing Intrinsic Amplitude: 21.75 mV
Lead Channel Sensing Intrinsic Amplitude: 3.375 mV
Lead Channel Sensing Intrinsic Amplitude: 3.375 mV
Lead Channel Setting Pacing Amplitude: 1.5 V
Lead Channel Setting Pacing Amplitude: 2.5 V
Lead Channel Setting Pacing Pulse Width: 0.4 ms
Lead Channel Setting Sensing Sensitivity: 0.3 mV
Zone Setting Status: 755011
Zone Setting Status: 755011

## 2023-02-28 DIAGNOSIS — J301 Allergic rhinitis due to pollen: Secondary | ICD-10-CM | POA: Diagnosis not present

## 2023-02-28 DIAGNOSIS — J4540 Moderate persistent asthma, uncomplicated: Secondary | ICD-10-CM | POA: Diagnosis not present

## 2023-02-28 DIAGNOSIS — G4733 Obstructive sleep apnea (adult) (pediatric): Secondary | ICD-10-CM | POA: Diagnosis not present

## 2023-03-04 ENCOUNTER — Telehealth: Payer: Self-pay

## 2023-03-04 NOTE — Telephone Encounter (Signed)
Lmtcb 5/13

## 2023-03-04 NOTE — Telephone Encounter (Signed)
Following alert received from [employer] received for sustained VT/VF with successful HV therapy. Event occurred 5/13 @ 05:45, EGM shows sustained VT, rate 286 falling into the VF zone, HV therapy delivered 35J converting to regular AS/VP.  Patient reports she was not aware of shock, patient was asleep during that time. Denies any symptoms recently or today. Was on recent beach trip - returned yesterday and did miss a medication but not sure which one. Stressed importance of compliance with medications, voiced understanding. Advised of shock plan and no driving per [place] DMV x6 months. Voiced understanding. Advised if she receives any further shocks today regardless of symptoms, she needs to call 911 and go to ER. Pt voiced understanding.

## 2023-03-05 ENCOUNTER — Ambulatory Visit: Payer: 59 | Admitting: Cardiology

## 2023-03-05 NOTE — Patient Instructions (Signed)
Below is our plan:  We will continue Emgality. Please make sure you are taking injections every 30 days. Use Ubrelvy as needed. Please take 1 tablet at onset of headache. May take 1 additional tablet in 2 hours if needed. Do not take more than 2 tablets in 24 hours or more than 10 in a month.   Please make sure you are staying well hydrated. I recommend 50-60 ounces daily. Well balanced diet and regular exercise encouraged. Consistent sleep schedule with 6-8 hours recommended.   Please continue follow up with care team as directed.   Follow up with me in 1 year   You may receive a survey regarding today's visit. I encourage you to leave honest feed back as I do use this information to improve patient care. Thank you for seeing me today!   GENERAL HEADACHE INFORMATION:   Natural supplements: Magnesium Oxide or Magnesium Glycinate 500 mg at bed (up to 800 mg daily) Coenzyme Q10 300 mg in AM Vitamin B2- 200 mg twice a day   Add 1 supplement at a time since even natural supplements can have undesirable side effects. You can sometimes buy supplements cheaper (especially Coenzyme Q10) at www.WebmailGuide.co.za or at [employer].   Vitamins and herbs that show potential:   Magnesium: Magnesium (250 mg twice a day or 500 mg at bed) has a relaxant effect on smooth muscles such as blood vessels. Individuals suffering from frequent or daily headache usually have low magnesium levels which can be increase with daily supplementation of 400-750 mg. Three trials found 40-90% average headache reduction  when used as a preventative. Magnesium also demonstrated the benefit in menstrually related migraine.  Magnesium is part of the messenger system in the serotonin cascade and it is a good muscle relaxant.  It is also useful for constipation which can be a side effect of other medications used to treat migraine. Good sources include nuts, whole grains, and tomatoes. Side Effects: loose stool/diarrhea  Riboflavin (vitamin  B 2) 200 mg twice a day. This vitamin assists nerve cells in the production of ATP a principal energy storing molecule.  It is necessary for many chemical reactions in the body.  There have been at least 3 clinical trials of riboflavin using 400 mg per day all of which suggested that migraine frequency can be decreased.  All 3 trials showed significant improvement in over half of migraine sufferers.  The supplement is found in bread, cereal, milk, meat, and poultry.  Most Americans get more riboflavin than the recommended daily allowance, however riboflavin deficiency is not necessary for the supplements to help prevent headache. Side effects: energizing, green urine   Coenzyme Q10: This is present in almost all cells in the body and is critical component for the conversion of energy.  Recent studies have shown that a nutritional supplement of CoQ10 can reduce the frequency of migraine attacks by improving the energy production of cells as with riboflavin.  Doses of 150 mg twice a day have been shown to be effective.   Melatonin: Increasing evidence shows correlation between melatonin secretion and headache conditions.  Melatonin supplementation has decreased headache intensity and duration.  It is widely used as a sleep aid.  Sleep is natures way of dealing with migraine.  A dose of 3 mg is recommended to start for headaches including cluster headache. Higher doses up to 15 mg has been reviewed for use in Cluster headache and have been used. The rationale behind using melatonin for cluster is that  many theories regarding the cause of Cluster headache center around the disruption of the normal circadian rhythm in the brain.  This helps restore the normal circadian rhythm.   HEADACHE DIET: Foods and beverages which may trigger migraine Note that only 20% of headache patients are food sensitive. You will know if you are food sensitive if you get a headache consistently 20 minutes to 2 hours after eating a  certain food. Only cut out a food if it causes headaches, otherwise you might remove foods you enjoy! What matters most for diet is to eat a well balanced healthy diet full of vegetables and low fat protein, and to not miss meals.   Chocolate, other sweets ALL cheeses except cottage and cream cheese Dairy products, yogurt, sour cream, ice cream Liver Meat extracts (Bovril, Marmite, meat tenderizers) Meats or fish which have undergone aging, fermenting, pickling or smoking. These include: Hotdogs,salami,Lox,sausage, mortadellas,smoked salmon, pepperoni, Pickled herring Pods of broad bean (English beans, Chinese pea pods, [place] (fava) beans, lima and navy beans Ripe avocado, ripe banana Yeast extracts or active yeast preparations such as Brewer's or Fleishman's (commercial bakes goods are permitted) Tomato based foods, pizza (lasagna, etc.)   MSG (monosodium glutamate) is disguised as many things; look for these common aliases: Monopotassium glutamate Autolysed yeast Hydrolysed protein Sodium caseinate "flavorings" "all natural preservatives" Nutrasweet   Avoid all other foods that convincingly provoke headaches.   Resources: The Dizzy [place] Laundry Your Headache Diet, migrainestrong.com  https://zamora-andrews.com/   Caffeine and Migraine For patients that have migraine, caffeine intake more than 3 days per week can lead to dependency and increased migraine frequency. I would recommend cutting back on your caffeine intake as best you can. The recommended amount of caffeine is 200-300 mg daily, although migraine patients may experience dependency at even lower doses. While you may notice an increase in headache temporarily, cutting back will be helpful for headaches in the long run. For more information on caffeine and migraine, visit: https://americanmigrainefoundation.org/resource-library/caffeine-and-migraine/   Headache Prevention  Strategies:   1. Maintain a headache diary; learn to identify and avoid triggers.  - This can be a simple note where you log when you had a headache, associated symptoms, and medications used - There are several smartphone apps developed to help track migraines: Migraine Buddy, Migraine Monitor, Curelator N1-Headache App   Common triggers include: Emotional triggers: Emotional/Upset family or friends Emotional/Upset occupation Business reversal/success Anticipation anxiety Crisis-serious Post-crisis periodNew job/position   Physical triggers: Vacation Day Weekend Strenuous Exercise High Altitude Location New Move Menstrual Day Physical Illness Oversleep/Not enough sleep Weather changes Light: Photophobia or light sesnitivity treatment involves a balance between desensitization and reduction in overly strong input. Use dark polarized glasses outside, but not inside. Avoid bright or fluorescent light, but do not dim environment to the point that going into a normally lit room hurts. Consider FL-41 tint lenses, which reduce the most irritating wavelengths without blocking too much light.  These can be obtained at axonoptics.com or theraspecs.com Foods: see list above.   2. Limit use of acute treatments (over-the-counter medications, triptans, etc.) to no more than 2 days per week or 10 days per month to prevent medication overuse headache (rebound headache).     3. Follow a regular schedule (including weekends and holidays): Don't skip meals. Eat a balanced diet. 8 hours of sleep nightly. Minimize stress. Exercise 30 minutes per day. Being overweight is associated with a 5 times increased risk of chronic migraine. Keep well hydrated and drink 6-8 glasses  of water per day.   4. Initiate non-pharmacologic measures at the earliest onset of your headache. Rest and quiet environment. Relax and reduce stress. Breathe2Relax is a free app that can instruct you on    some simple relaxtion  and breathing techniques. Http://Dawnbuse.com is a    free website that provides teaching videos on relaxation.  Also, there are  many apps that   can be downloaded for "mindful" relaxation.  An app called YOGA NIDRA will help walk you through mindfulness. Another app called Calm can be downloaded to give you a structured mindfulness guide with daily reminders and skill development. Headspace for guided meditation Mindfulness Based Stress Reduction Online Course: www.palousemindfulness.com Cold compresses.   5. Don't wait!! Take the maximum allowable dosage of prescribed medication at the first sign of migraine.   6. Compliance:  Take prescribed medication regularly as directed and at the first sign of a migraine.   7. Communicate:  Call your physician when problems arise, especially if your headaches change, increase in frequency/severity, or become associated with neurological symptoms (weakness, numbness, slurred speech, etc.).   8. Headache/pain management therapies: Consider various complementary methods, including medication, behavioral therapy, psychological counselling, biofeedback, massage therapy, acupuncture, dry needling, and other modalities.  Such measures may reduce the need for medications. Counseling for pain management, where patients learn to function and ignore/minimize their pain, seems to work very well.   9. Recommend changing family's attention and focus away from patient's headaches. Instead, emphasize daily activities. If first question of day is 'How are your headaches/Do you have a headache today?', then patient will constantly think about headaches, thus making them worse. Goal is to re-direct attention away from headaches, toward daily activities and other distractions.   10. Helpful Websites: www.AmericanHeadacheSociety.org PatentHood.ch www.headaches.org TightMarket.nl www.achenet.org

## 2023-03-05 NOTE — Progress Notes (Unsigned)
No chief complaint on file.   HISTORY OF PRESENT ILLNESS:  03/05/23 ALL:  Shannon Baxter is a 62 y.o. female here today for follow up for migraines. She was last seen by Dr Delena Bali 08/2022. She was doing well on Emgality but having worsening headaches due to being off injections for previous 3 months. Emgality restarted. Bernita Raisin started for abortive therapy. Since,    HISTORY (copied from Dr Quentin Mulling previous note)  Brief HPI: 62 year old female with a history of aortic stenosis s/p mechanical AVR on coumadin, dilated cardiomyopathy, aflutter s/p ablation and ICD, PVD, CAD, COPD, HTN, HLD, left putamen infarct 2008 who follows in clinic for chronic migraines.  At her last visit she was started on Emgality for migraine prevention. CTH was ordered.  Interval History: Headaches are less severe from her last visit and are no longer constant. They had improved significantly with Emgality, but she ran out of it 3 months ago and headaches have worsened since then. She is averaging 3-4 migraines per week. She takes Tylenol as needed which does not help much and upsets her stomach.   [facility] 07/31/21 was unremarkable.  Headache days per month: 14 Headache free days per month: 16  Current Headache Regimen: Preventative: Emgality 120 mg monthly (currently out of medication) Abortive: Tylenol  Prior Therapies                                  Gabapentin 300 mg TID Carvedilol 25 mg BID Valsartan 51 mg BID Emgality 120 mg monthly - effective   REVIEW OF SYSTEMS: Out of a complete 14 system review of symptoms, the patient complains only of the following symptoms, headaches and all other reviewed systems are negative.   ALLERGIES: Allergies  Allergen Reactions   Iodinated Contrast Media Hives and Rash   Ioxaglate Hives     HOME MEDICATIONS: Outpatient Medications Prior to Visit  Medication Sig Dispense Refill   albuterol (PROVENTIL HFA;VENTOLIN HFA) 108 (90 Base) MCG/ACT inhaler Inhale  1-2 puffs into the lungs every 6 (six) hours as needed for wheezing or shortness of breath.     ALPRAZolam (XANAX) 0.5 MG tablet Take 0.5 mg by mouth 3 (three) times daily as needed for anxiety.   0   amiodarone (PACERONE) 200 MG tablet Take 0.5 tablets (100 mg total) by mouth daily. 90 tablet 3   atorvastatin (LIPITOR) 80 MG tablet Take 80 mg by mouth daily. (Patient not taking: Reported on 12/28/2022)     carvedilol (COREG) 25 MG tablet TAKE 1 TABLET BY MOUTH TWICE A DAY 180 tablet 3   clopidogrel (PLAVIX) 75 MG tablet Take 75 mg by mouth daily after breakfast.   6   Cyanocobalamin (B-12) 500 MCG TABS Take 3 tablets by mouth daily.     Docusate Sodium (DSS) 100 MG CAPS Take 100 mg by mouth daily.     doxycycline (VIBRAMYCIN) 100 MG capsule Take 100 mg by mouth 2 (two) times daily.     ENTRESTO 49-51 MG TAKE 1 TABLET BY MOUTH TWICE A DAY 180 tablet 3   EPIPEN 2-PAK 0.3 MG/0.3ML SOAJ injection Inject 0.3 mg into the muscle once.   0   ezetimibe (ZETIA) 10 MG tablet Take 10 mg by mouth daily.     fluticasone (FLONASE) 50 MCG/ACT nasal spray Place 2 sprays into both nostrils daily as needed for allergies.      furosemide (LASIX) 20 MG  tablet TAKE 3 TABLETS BY MOUTH 2 TIMES DAILY. 540 tablet 2   gabapentin (NEURONTIN) 300 MG capsule Take 300 mg by mouth 3 (three) times daily.   5   Galcanezumab-gnlm (EMGALITY) 120 MG/ML SOAJ Inject 120 mg into the skin every 30 (thirty) days. 1.12 mL 11   glimepiride (AMARYL) 4 MG tablet Take 4 mg by mouth daily with breakfast.      hydrALAZINE (APRESOLINE) 25 MG tablet Take 25 mg by mouth 3 (three) times daily.     HYDROcodone-acetaminophen (NORCO) 10-325 MG tablet Take 1 tablet by mouth 4 (four) times daily as needed for moderate pain or severe pain.     levothyroxine (SYNTHROID) 100 MCG tablet Take 100 mcg by mouth daily.     linaclotide (LINZESS) 72 MCG capsule Take 72 mcg by mouth daily as needed (constipation).     loratadine (CLARITIN) 10 MG tablet Take 10 mg  by mouth at bedtime.      metFORMIN (GLUCOPHAGE) 500 MG tablet Take 500 mg by mouth 2 (two) times daily with a meal.      montelukast (SINGULAIR) 10 MG tablet Take 10 mg by mouth at bedtime.      Multiple Vitamins-Minerals (CENTRUM SILVER ULTRA WOMENS) TABS Take 1 tablet by mouth daily. Unknown strength     nystatin (MYCOSTATIN/NYSTOP) powder Apply 1 application topically 3 (three) times daily. Unknown strength     omeprazole (PRILOSEC) 20 MG capsule Take 20 mg by mouth at bedtime.      ondansetron (ZOFRAN-ODT) 4 MG disintegrating tablet Take 4 mg by mouth every 6 (six) hours.     Potassium Chloride ER 20 MEQ TBCR TAKE 1 TABLET BY MOUTH EVERY DAY 90 tablet 2   predniSONE (DELTASONE) 10 MG tablet Take 10 mg by mouth daily with breakfast.     Red Yeast Rice 600 MG TABS Take 2 tablets by mouth daily.     rosuvastatin (CRESTOR) 20 MG tablet Take 1 tablet (20 mg total) by mouth daily. 90 tablet 2   spironolactone (ALDACTONE) 25 MG tablet Take 0.5 tablets (12.5 mg total) by mouth daily. 45 tablet 1   TRELEGY ELLIPTA 100-62.5-25 MCG/ACT AEPB Inhale 1 puff into the lungs daily.     TRELEGY ELLIPTA 200-62.5-25 MCG/ACT AEPB Inhale 1 puff into the lungs daily.     Ubrogepant (UBRELVY) 50 MG TABS Take 50 mg by mouth as needed (migraine). May repeat a dose in 2 hours if needed. Max dose 2 pills in 24 hours 16 tablet 6   warfarin (COUMADIN) 4 MG tablet Take 4 mg by mouth daily.     warfarin (COUMADIN) 5 MG tablet Take 5 mg by mouth See admin instructions. Take 6 mg on Sunday, Tuesday, and  Thursday . Take 5 mg on all other days     No facility-administered medications prior to visit.     PAST MEDICAL HISTORY: Past Medical History:  Diagnosis Date   Acute on chronic systolic heart failure (HCC) 02/21/2016   Allergy    Aortic insufficiency    Aortic stenosis    Arthritis    Asthma    Atherosclerosis of native arteries of extremities with intermittent claudication, bilateral legs (HCC) 08/04/2015    Carotid atherosclerosis 07/26/2015   CHF (congestive heart failure) (HCC)    Chronic systolic congestive heart failure (HCC) 12/06/2016   Claudication (HCC)    Congestive heart failure (CHF) (HCC)    Coronary artery disease    Coronary artery disease involving native coronary artery of native  heart without angina pectoris 07/26/2015   Diabetes mellitus without complication (HCC)    Dilated cardiomyopathy (HCC) 09/03/2016   Overview:  Ejection fraction 20-25%   Dyslipidemia 07/26/2015   Dyspnea on exertion 10/26/2015   Essential hypertension 07/26/2015   GERD (gastroesophageal reflux disease)    Heart murmur    History of mechanical aortic valve replacement 03/29/2016   Overview:  May 2017. St Judes 23 mm  Formatting of this note might be different from the original. Overview:  May 2017. St Judes 23 mm Formatting of this note might be different from the original. May 2017. St Judes 23 mm   History of tobacco abuse 07/26/2015   Hyperlipidemia    Hypertension    Hypothyroidism 01/23/2016   Intermittent claudication (HCC) 07/26/2015   Morbid obesity (HCC) 12/06/2016   Nutritional and metabolic cardiomyopathy (HCC)    Paroxysmal atrial fibrillation (HCC) 03/06/2016   Paroxysmal atrial flutter (HCC) 09/01/2018   Peripheral vascular disease (HCC) 07/26/2015   Presence of automatic implantable cardioverter-defibrillator 10/23/2016   PVD (peripheral vascular disease) (HCC)    Rheumatic aortic valve insufficiency 03/05/2016   Sleep apnea    Stroke (HCC)    Type 2 diabetes mellitus with diabetic peripheral angiopathy without gangrene, without long-term current use of insulin (HCC) 11/22/2015     PAST SURGICAL HISTORY: Past Surgical History:  Procedure Laterality Date   A-FLUTTER ABLATION N/A 11/05/2018   Procedure: A-FLUTTER ABLATION;  Surgeon: Regan Lemming, MD;  Location: [facility];  Service: Cardiovascular;  Laterality: N/A;   CARDIAC CATHETERIZATION     INSERT /  REPLACE / REMOVE PACEMAKER     Medtronic ICD   MECHANICAL AORTIC VALVE REPLACEMENT     PERIPHERAL ARTERIAL STENT GRAFT     TUBAL LIGATION       FAMILY HISTORY: Family History  Problem Relation Age of Onset   Heart disease Brother      SOCIAL HISTORY: Social History   Socioeconomic History   Marital status: Single    Spouse name: Not on file   Number of children: Not on file   Years of education: Not on file   Highest education level: Not on file  Occupational History   Not on file  Tobacco Use   Smoking status: Former   Smokeless tobacco: Never  Vaping Use   Vaping Use: Never used  Substance and Sexual Activity   Alcohol use: No   Drug use: No   Sexual activity: Not on file  Other Topics Concern   Not on file  Social History Narrative   Lives alone   Social Determinants of Health   Financial Resource Strain: Not on file  Food Insecurity: Not on file  Transportation Needs: No Transportation Needs (05/14/2019)   PRAPARE - Transportation    Lack of Transportation (Medical): No    Lack of Transportation (Non-Medical): No  Physical Activity: Not on file  Stress: Not on file  Social Connections: Not on file  Intimate Partner Violence: Not on file     PHYSICAL EXAM  There were no vitals filed for this visit. There is no height or weight on file to calculate BMI.  Generalized: Well developed, in no acute distress  Cardiology: normal rate and rhythm, no murmur auscultated  Respiratory: clear to auscultation bilaterally    Neurological examination  Mentation: Alert oriented to time, place, history taking. Follows all commands speech and language fluent Cranial nerve II-XII: Pupils were equal round reactive to light. Extraocular movements were full,  visual field were full on confrontational test. Facial sensation and strength were normal. Uvula tongue midline. Head turning and shoulder shrug  were normal and symmetric. Motor: The motor testing reveals 5 over 5  strength of all 4 extremities. Good symmetric motor tone is noted throughout.  Sensory: Sensory testing is intact to soft touch on all 4 extremities. No evidence of extinction is noted.  Coordination: Cerebellar testing reveals good finger-nose-finger and heel-to-shin bilaterally.  Gait and station: Gait is normal. Tandem gait is normal. Romberg is negative. No drift is seen.  Reflexes: Deep tendon reflexes are symmetric and normal bilaterally.    DIAGNOSTIC DATA (LABS, IMAGING, TESTING) - I reviewed patient records, labs, notes, testing and imaging myself where available.  Lab Results  Component Value Date   WBC 5.0 03/21/2022   HGB 12.6 03/21/2022   HCT 39.7 03/21/2022   MCV 88 03/21/2022   PLT 220 03/21/2022      Component Value Date/Time   NA 144 01/16/2023 1253   K 4.3 01/16/2023 1253   CL 104 01/16/2023 1253   CO2 24 01/16/2023 1253   GLUCOSE 59 (L) 01/16/2023 1253   GLUCOSE 99 03/22/2011 1200   BUN 22 01/16/2023 1253   CREATININE 1.10 (H) 01/16/2023 1253   CALCIUM 8.8 01/16/2023 1253   PROT 7.0 12/27/2022 0952   ALBUMIN 3.7 (L) 12/27/2022 0952   AST 22 12/27/2022 0952   ALT 26 12/27/2022 0952   ALKPHOS 59 12/27/2022 0952   BILITOT <0.2 12/27/2022 0952   GFRNONAA 69 06/03/2020 1356   GFRAA 79 06/03/2020 1356   Lab Results  Component Value Date   CHOL 176 06/19/2021   HDL 48 06/19/2021   LDLCALC 108 (H) 06/19/2021   TRIG 108 06/19/2021   CHOLHDL 3.7 06/19/2021   Lab Results  Component Value Date   HGBA1C  07/04/2007    6.1 (NOTE)   The ADA recommends the following therapeutic goals for glycemic   control related to Hgb A1C measurement:   Goal of Therapy:   < 7.0% Hgb A1C   Action Suggested:  > 8.0% Hgb A1C   Ref:  Diabetes Care, 22, Suppl. 1, 1999   No results found for: "VITAMINB12" Lab Results  Component Value Date   TSH 3.600 03/21/2022        No data to display               No data to display           ASSESSMENT AND PLAN  62 y.o.  year old female  has a past medical history of Acute on chronic systolic heart failure (HCC) (60/45/4098), Allergy, Aortic insufficiency, Aortic stenosis, Arthritis, Asthma, Atherosclerosis of native arteries of extremities with intermittent claudication, bilateral legs (HCC) (08/04/2015), Carotid atherosclerosis (07/26/2015), CHF (congestive heart failure) (HCC), Chronic systolic congestive heart failure (HCC) (12/06/2016), Claudication (HCC), Congestive heart failure (CHF) (HCC), Coronary artery disease, Coronary artery disease involving native coronary artery of native heart without angina pectoris (07/26/2015), Diabetes mellitus without complication (HCC), Dilated cardiomyopathy (HCC) (09/03/2016), Dyslipidemia (07/26/2015), Dyspnea on exertion (10/26/2015), Essential hypertension (07/26/2015), GERD (gastroesophageal reflux disease), Heart murmur, History of mechanical aortic valve replacement (03/29/2016), History of tobacco abuse (07/26/2015), Hyperlipidemia, Hypertension, Hypothyroidism (01/23/2016), Intermittent claudication (HCC) (07/26/2015), Morbid obesity (HCC) (12/06/2016), Nutritional and metabolic cardiomyopathy (HCC), Paroxysmal atrial fibrillation (HCC) (03/06/2016), Paroxysmal atrial flutter (HCC) (09/01/2018), Peripheral vascular disease (HCC) (07/26/2015), Presence of automatic implantable cardioverter-defibrillator (10/23/2016), PVD (peripheral vascular disease) (HCC), Rheumatic aortic valve insufficiency (03/05/2016), Sleep apnea, Stroke (HCC),  and Type 2 diabetes mellitus with diabetic peripheral angiopathy without gangrene, without long-term current use of insulin (HCC) (11/22/2015). here with    No diagnosis found.  Sallyanne Havers ***.  Healthy lifestyle habits encouraged. *** will follow up with PCP as directed. *** will return to see me in ***, sooner if needed. *** verbalizes understanding and agreement with this plan.   No orders of the defined types were placed in this  encounter.    No orders of the defined types were placed in this encounter.    Shawnie Dapper, MSN, FNP-C 03/05/2023, 1:36 PM  [facility] [address], [address] [place], [place] [address] [phone]

## 2023-03-06 ENCOUNTER — Ambulatory Visit (INDEPENDENT_AMBULATORY_CARE_PROVIDER_SITE_OTHER): Payer: 59 | Admitting: Family Medicine

## 2023-03-06 ENCOUNTER — Other Ambulatory Visit: Payer: Self-pay

## 2023-03-06 ENCOUNTER — Encounter: Payer: Self-pay | Admitting: Family Medicine

## 2023-03-06 VITALS — BP 111/51 | HR 72 | Ht 72.0 in | Wt 339.0 lb

## 2023-03-06 DIAGNOSIS — G43E09 Chronic migraine with aura, not intractable, without status migrainosus: Secondary | ICD-10-CM | POA: Diagnosis not present

## 2023-03-06 DIAGNOSIS — R791 Abnormal coagulation profile: Secondary | ICD-10-CM | POA: Diagnosis not present

## 2023-03-06 DIAGNOSIS — I480 Paroxysmal atrial fibrillation: Secondary | ICD-10-CM | POA: Diagnosis not present

## 2023-03-06 DIAGNOSIS — T148XXA Other injury of unspecified body region, initial encounter: Secondary | ICD-10-CM | POA: Diagnosis not present

## 2023-03-06 MED ORDER — EMGALITY 120 MG/ML ~~LOC~~ SOAJ: 120.0000 mg | SUBCUTANEOUS | 11 refills | Status: DC

## 2023-03-06 MED ORDER — UBRELVY 50 MG PO TABS: 50.0000 mg | ORAL_TABLET | ORAL | 6 refills | Status: DC | PRN

## 2023-03-06 NOTE — Telephone Encounter (Signed)
Called patient and informed her of Dr. Vanetta Shawl recommendation below:  "She did have shock from ICD last night.  Please check Chem-7 and magnesium"  Patient verbalized understanding and had no further questions at this time.

## 2023-03-07 LAB — BASIC METABOLIC PANEL
BUN/Creatinine Ratio: 18 (ref 12–28)
BUN: 20 mg/dL (ref 8–27)
CO2: 24 mmol/L (ref 20–29)
Calcium: 8.8 mg/dL (ref 8.7–10.3)
Chloride: 104 mmol/L (ref 96–106)
Creatinine, Ser: 1.09 mg/dL — ABNORMAL HIGH (ref 0.57–1.00)
Glucose: 64 mg/dL — ABNORMAL LOW (ref 70–99)
Potassium: 4.3 mmol/L (ref 3.5–5.2)
Sodium: 143 mmol/L (ref 134–144)
eGFR: 58 mL/min/{1.73_m2} — ABNORMAL LOW (ref >59)

## 2023-03-07 LAB — MAGNESIUM: Magnesium: 2 mg/dL (ref 1.6–2.3)

## 2023-03-13 ENCOUNTER — Ambulatory Visit (INDEPENDENT_AMBULATORY_CARE_PROVIDER_SITE_OTHER): Payer: 59 | Admitting: Podiatry

## 2023-03-13 DIAGNOSIS — S90111A Contusion of right great toe without damage to nail, initial encounter: Secondary | ICD-10-CM

## 2023-03-13 DIAGNOSIS — M79674 Pain in right toe(s): Secondary | ICD-10-CM

## 2023-03-13 DIAGNOSIS — G4733 Obstructive sleep apnea (adult) (pediatric): Secondary | ICD-10-CM | POA: Diagnosis not present

## 2023-03-13 DIAGNOSIS — T148XXA Other injury of unspecified body region, initial encounter: Secondary | ICD-10-CM | POA: Diagnosis not present

## 2023-03-14 NOTE — Progress Notes (Signed)
Chief Complaint  Patient presents with   Foot Problem    Patient had a blister to her right hallux on the bottom size the beginning of May. Blister size has decreased and does not appear to have any fluid on the inside. Patient was wearing water shoes and her foot slid under the stairs while going up the stairs. Patient is diabetic and takes metformin for diabetes control.     HPI: 62 y.o. female presenting today with a painful blood blister/hematoma on the bottom of the right great toe.  States that this began when she wore a pair of water shoes that were too small and were rubbing the bottom of her big toe.  She states that it has been present ever since and has not drained.  She states that it is painful.  Denies fever chills night sweats nausea vomiting.  Her most recent blood glucose obtained on 03/06/2023 was 64 mg/dL.  Her glomerular filtration rate is 58.  Could not find a recent HbA1c level in the Epic system.  Past Medical History:  Diagnosis Date   Acute on chronic systolic heart failure (HCC) 02/21/2016   Allergy    Aortic insufficiency    Aortic stenosis    Arthritis    Asthma    Atherosclerosis of native arteries of extremities with intermittent claudication, bilateral legs (HCC) 08/04/2015   Carotid atherosclerosis 07/26/2015   CHF (congestive heart failure) (HCC)    Chronic systolic congestive heart failure (HCC) 12/06/2016   Claudication (HCC)    Congestive heart failure (CHF) (HCC)    Coronary artery disease    Coronary artery disease involving native coronary artery of native heart without angina pectoris 07/26/2015   Diabetes mellitus without complication (HCC)    Dilated cardiomyopathy (HCC) 09/03/2016   Overview:  Ejection fraction 20-25%   Dyslipidemia 07/26/2015   Dyspnea on exertion 10/26/2015   Essential hypertension 07/26/2015   GERD (gastroesophageal reflux disease)    Heart murmur    History of mechanical aortic valve replacement 03/29/2016    Overview:  May 2017. St Judes 23 mm  Formatting of this note might be different from the original. Overview:  May 2017. St Judes 23 mm Formatting of this note might be different from the original. May 2017. St Judes 23 mm   History of tobacco abuse 07/26/2015   Hyperlipidemia    Hypertension    Hypothyroidism 01/23/2016   Intermittent claudication (HCC) 07/26/2015   Morbid obesity (HCC) 12/06/2016   Nutritional and metabolic cardiomyopathy (HCC)    Paroxysmal atrial fibrillation (HCC) 03/06/2016   Paroxysmal atrial flutter (HCC) 09/01/2018   Peripheral vascular disease (HCC) 07/26/2015   Presence of automatic implantable cardioverter-defibrillator 10/23/2016   PVD (peripheral vascular disease) (HCC)    Rheumatic aortic valve insufficiency 03/05/2016   Sleep apnea    Stroke (HCC)    Type 2 diabetes mellitus with diabetic peripheral angiopathy without gangrene, without long-term current use of insulin (HCC) 11/22/2015    Past Surgical History:  Procedure Laterality Date   A-FLUTTER ABLATION N/A 11/05/2018   Procedure: A-FLUTTER ABLATION;  Surgeon: Regan Lemming, MD;  Location: [facility];  Service: Cardiovascular;  Laterality: N/A;   CARDIAC CATHETERIZATION     INSERT / REPLACE / REMOVE PACEMAKER     Medtronic ICD   MECHANICAL AORTIC VALVE REPLACEMENT     PERIPHERAL ARTERIAL STENT GRAFT     TUBAL LIGATION      Allergies  Allergen Reactions  Iodinated Contrast Media Hives and Rash   Ioxaglate Hives     Physical Exam:  General: The patient is alert and oriented x3 in no acute distress.  Dermatology: Skin is warm, dry and supple bilateral lower extremities. Interspaces are clear of maceration and debris.  There is a 1.5 cm x 0.8 cm superficial hematoma on the plantar aspect of the right hallux near the plantar medial IP joint.  This is filled with fluid and fluctuant on manipulation.  There is pain on palpation.  No surrounding erythema is noted.  No necrosis is  seen.  Vascular: Palpable pedal pulses bilaterally. Capillary refill within normal limits.  No appreciable edema.  No erythema or calor.  Neurological: Light touch sensation grossly intact bilateral feet.   Assessment/Plan of Care: 1. Contusion of right great toe without damage to nail, initial encounter   2. Hematoma   3. Pain in toe of right foot     Discussed clinical findings with patient today.  With the patient's consent, a sterile prep was performed to the right great toe.  A sterile #[id number] blade was utilized to puncture the hematoma and allow it to drain.  There was no purulence in the drainage.  Less than 1 cc of blood was drained from the area.  The roof was left intact to act as a second barrier to bacteria.  Antibiotic ointment, gauze, and Coban were used as a dressing.  She will leave this on until tomorrow.  She can then switch to a dry Band-Aid and perform Epsom salt soaks daily for 15 minutes in lukewarm water to help disinfect the area and prevent secondary contamination.  It should be noted that during the discussion of treatment plan, the patient proceeded to take a personal phone call during her appointment and continued with her conversation of the nonemergent nature even after the physician left the exam room  Follow-up in 1 week if there are still symptoms or any signs of infection.  Otherwise follow-up as needed   Clerance Lav, DPM, FACFAS [facility]     [address] [place], [place] [address]                Office [phone]  Fax [phone]

## 2023-03-20 NOTE — Progress Notes (Signed)
Remote ICD transmission.   

## 2023-03-21 ENCOUNTER — Telehealth: Payer: Self-pay

## 2023-03-21 DIAGNOSIS — E039 Hypothyroidism, unspecified: Secondary | ICD-10-CM | POA: Diagnosis not present

## 2023-03-21 DIAGNOSIS — Z79899 Other long term (current) drug therapy: Secondary | ICD-10-CM | POA: Diagnosis not present

## 2023-03-21 DIAGNOSIS — E785 Hyperlipidemia, unspecified: Secondary | ICD-10-CM | POA: Diagnosis not present

## 2023-03-21 DIAGNOSIS — I4891 Unspecified atrial fibrillation: Secondary | ICD-10-CM | POA: Diagnosis not present

## 2023-03-21 DIAGNOSIS — I110 Hypertensive heart disease with heart failure: Secondary | ICD-10-CM | POA: Diagnosis not present

## 2023-03-21 DIAGNOSIS — E1142 Type 2 diabetes mellitus with diabetic polyneuropathy: Secondary | ICD-10-CM | POA: Diagnosis not present

## 2023-03-21 DIAGNOSIS — R791 Abnormal coagulation profile: Secondary | ICD-10-CM | POA: Diagnosis not present

## 2023-03-21 NOTE — Telephone Encounter (Signed)
Results reviewed with pt as per Dr. Krasowski's note.  Pt verbalized understanding and had no additional questions. Routed to PCP  

## 2023-03-27 ENCOUNTER — Other Ambulatory Visit: Payer: Self-pay | Admitting: Cardiology

## 2023-03-27 NOTE — Telephone Encounter (Signed)
RX sent

## 2023-03-29 ENCOUNTER — Ambulatory Visit: Payer: 59 | Admitting: Cardiology

## 2023-04-03 DIAGNOSIS — K219 Gastro-esophageal reflux disease without esophagitis: Secondary | ICD-10-CM | POA: Diagnosis not present

## 2023-04-03 DIAGNOSIS — R791 Abnormal coagulation profile: Secondary | ICD-10-CM | POA: Diagnosis not present

## 2023-04-03 DIAGNOSIS — E119 Type 2 diabetes mellitus without complications: Secondary | ICD-10-CM | POA: Diagnosis not present

## 2023-04-03 DIAGNOSIS — I10 Essential (primary) hypertension: Secondary | ICD-10-CM | POA: Diagnosis not present

## 2023-04-04 DIAGNOSIS — G4733 Obstructive sleep apnea (adult) (pediatric): Secondary | ICD-10-CM | POA: Diagnosis not present

## 2023-04-04 DIAGNOSIS — J4540 Moderate persistent asthma, uncomplicated: Secondary | ICD-10-CM | POA: Diagnosis not present

## 2023-04-04 DIAGNOSIS — R791 Abnormal coagulation profile: Secondary | ICD-10-CM | POA: Diagnosis not present

## 2023-04-04 DIAGNOSIS — J301 Allergic rhinitis due to pollen: Secondary | ICD-10-CM | POA: Diagnosis not present

## 2023-04-05 ENCOUNTER — Telehealth: Payer: Self-pay | Admitting: Cardiology

## 2023-04-05 NOTE — Telephone Encounter (Signed)
See previous note

## 2023-04-05 NOTE — Telephone Encounter (Signed)
Patient c/o Palpitations:  High priority if patient c/o lightheadedness, shortness of breath, or chest pain  How long have you had palpitations/irregular HR/ Afib? Are you having the symptoms now?   Yes  Are you currently experiencing lightheadedness, SOB or CP?   Lightheaded, SOB  Do you have a history of afib (atrial fibrillation) or irregular heart rhythm?   Yes  Have you checked your BP or HR? (document readings if available):   140/78  HR 68  Are you experiencing any other symptoms?

## 2023-04-05 NOTE — Telephone Encounter (Signed)
Spoke with pt, she was seen by a doctor, did not understand who, and they told her that her heart rate of 68 was too low. She reports being lightheaded and SOB. She denies any edema and her weight is down 2 lbs. She feels like she is still in rhythm and her blood pressure is running 120's/80's. Explained to the patient that a heart rate of 68 is normal. They had told her to get a sooner appointment than 05/09/23 and I offered her an appointment next week but she is going to wait until her already scheduled appointment. She endorses fatigue but reports swimming and other activities that make her tired. Patient voiced understanding to call prior to appointment with any concerns.

## 2023-04-08 ENCOUNTER — Telehealth: Payer: Self-pay | Admitting: Cardiology

## 2023-04-08 DIAGNOSIS — R791 Abnormal coagulation profile: Secondary | ICD-10-CM | POA: Diagnosis not present

## 2023-04-08 NOTE — Telephone Encounter (Signed)
Pt called in asking if Dr. Bing Matter can check her INR. She stated "my other doctor wants to send me to the hospital but the hospital keep telling me there is nothing wrong." Please advise.

## 2023-04-08 NOTE — Telephone Encounter (Signed)
Advised that he is sending her to the hospital as he can get the results today rather than sending out lab.

## 2023-04-09 DIAGNOSIS — R791 Abnormal coagulation profile: Secondary | ICD-10-CM | POA: Diagnosis not present

## 2023-04-09 DIAGNOSIS — I350 Nonrheumatic aortic (valve) stenosis: Secondary | ICD-10-CM | POA: Diagnosis not present

## 2023-04-11 DIAGNOSIS — R791 Abnormal coagulation profile: Secondary | ICD-10-CM | POA: Diagnosis not present

## 2023-04-15 ENCOUNTER — Telehealth: Payer: Self-pay

## 2023-04-15 DIAGNOSIS — M545 Low back pain, unspecified: Secondary | ICD-10-CM | POA: Diagnosis not present

## 2023-04-15 NOTE — Telephone Encounter (Signed)
Transition Care Management Unsuccessful Follow-up Telephone Call  Date of discharge and from where:  [facility] 6/17  Attempts:  1st Attempt  Reason for unsuccessful TCM follow-up call:  Left voice message   Preeya Cleck[facility], [place] [phone] [address], [place], [place] [address] Phone: [phone] Email: [email or web address]       

## 2023-04-16 ENCOUNTER — Telehealth: Payer: Self-pay

## 2023-04-16 NOTE — Telephone Encounter (Signed)
Transition Care Management Unsuccessful Follow-up Telephone Call  Date of discharge and from where:  [facility] 6/17  Attempts:  2nd Attempt  Reason for unsuccessful TCM follow-up call:  Left voice message   [facility] Guide, [facility] [phone] [address] [place], [place], [place] [address] Phone: [phone] Email: [email or web address]

## 2023-04-18 ENCOUNTER — Ambulatory Visit: Payer: 59 | Admitting: Podiatry

## 2023-04-18 DIAGNOSIS — G4733 Obstructive sleep apnea (adult) (pediatric): Secondary | ICD-10-CM | POA: Diagnosis not present

## 2023-04-18 DIAGNOSIS — J301 Allergic rhinitis due to pollen: Secondary | ICD-10-CM | POA: Diagnosis not present

## 2023-04-18 DIAGNOSIS — R791 Abnormal coagulation profile: Secondary | ICD-10-CM | POA: Diagnosis not present

## 2023-04-18 DIAGNOSIS — J4540 Moderate persistent asthma, uncomplicated: Secondary | ICD-10-CM | POA: Diagnosis not present

## 2023-04-19 DIAGNOSIS — R791 Abnormal coagulation profile: Secondary | ICD-10-CM | POA: Diagnosis not present

## 2023-04-24 DIAGNOSIS — R791 Abnormal coagulation profile: Secondary | ICD-10-CM | POA: Diagnosis not present

## 2023-04-30 DIAGNOSIS — M1712 Unilateral primary osteoarthritis, left knee: Secondary | ICD-10-CM | POA: Diagnosis not present

## 2023-05-01 DIAGNOSIS — R791 Abnormal coagulation profile: Secondary | ICD-10-CM | POA: Diagnosis not present

## 2023-05-01 DIAGNOSIS — J309 Allergic rhinitis, unspecified: Secondary | ICD-10-CM | POA: Diagnosis not present

## 2023-05-03 DIAGNOSIS — R791 Abnormal coagulation profile: Secondary | ICD-10-CM | POA: Diagnosis not present

## 2023-05-07 DIAGNOSIS — R791 Abnormal coagulation profile: Secondary | ICD-10-CM | POA: Diagnosis not present

## 2023-05-09 ENCOUNTER — Encounter: Payer: Self-pay | Admitting: Cardiology

## 2023-05-09 ENCOUNTER — Ambulatory Visit: Payer: 59 | Attending: Cardiology | Admitting: Cardiology

## 2023-05-09 VITALS — BP 120/72 | HR 95 | Ht 70.0 in | Wt 328.0 lb

## 2023-05-09 DIAGNOSIS — R0609 Other forms of dyspnea: Secondary | ICD-10-CM

## 2023-05-09 DIAGNOSIS — I420 Dilated cardiomyopathy: Secondary | ICD-10-CM

## 2023-05-09 DIAGNOSIS — I2510 Atherosclerotic heart disease of native coronary artery without angina pectoris: Secondary | ICD-10-CM | POA: Diagnosis not present

## 2023-05-09 DIAGNOSIS — E785 Hyperlipidemia, unspecified: Secondary | ICD-10-CM

## 2023-05-09 DIAGNOSIS — Z952 Presence of prosthetic heart valve: Secondary | ICD-10-CM

## 2023-05-09 DIAGNOSIS — I480 Paroxysmal atrial fibrillation: Secondary | ICD-10-CM

## 2023-05-09 DIAGNOSIS — Z0000 Encounter for general adult medical examination without abnormal findings: Secondary | ICD-10-CM | POA: Diagnosis not present

## 2023-05-09 DIAGNOSIS — R791 Abnormal coagulation profile: Secondary | ICD-10-CM | POA: Diagnosis not present

## 2023-05-09 NOTE — Progress Notes (Signed)
Cardiology Office Note:    Date:  05/09/2023   ID:  Shannon Baxter, DOB [birth year], MRN [id number]  PCP:  Paulina Fusi, MD  Cardiologist:  Gypsy Balsam, MD    Referring MD: Paulina Fusi, MD   Chief Complaint  Patient presents with   Atrial Fibrillation    History of Present Illness:    Shannon Baxter is a 62 y.o. female with past medical history significant for aortic valve replacement she got 23 mm [employer] Jude prosthesis done in May 2017 cardiomyopathy with ejection fraction 35%, paroxysmal atrial fibrillation/flutter, obesity, BiV pacing.  She comes today to my office she thinks she gets reoccurrence of atrial fibrillation.  She said sometimes she can feel her heart speeding up and apparently she was in the primary care physician office she was noted to be in atrial fibrillation.  I did review interrogation of her device that was done in May and did not show much of atrial fibrillation.  Denies have any chest pain tightness squeezing pressure burning chest shortness of breath still there.  Past Medical History:  Diagnosis Date   Acute on chronic systolic heart failure (HCC) 02/21/2016   Allergy    Aortic insufficiency    Aortic stenosis    Arthritis    Asthma    Atherosclerosis of native arteries of extremities with intermittent claudication, bilateral legs (HCC) 08/04/2015   Carotid atherosclerosis 07/26/2015   CHF (congestive heart failure) (HCC)    Chronic systolic congestive heart failure (HCC) 12/06/2016   Claudication (HCC)    Congestive heart failure (CHF) (HCC)    Coronary artery disease    Coronary artery disease involving native coronary artery of native heart without angina pectoris 07/26/2015   Diabetes mellitus without complication (HCC)    Dilated cardiomyopathy (HCC) 09/03/2016   Overview:  Ejection fraction 20-25%   Dyslipidemia 07/26/2015   Dyspnea on exertion 10/26/2015   Essential hypertension 07/26/2015   GERD (gastroesophageal reflux disease)     Heart murmur    History of mechanical aortic valve replacement 03/29/2016   Overview:  May 2017. St Judes 23 mm  Formatting of this note might be different from the original. Overview:  May 2017. St Judes 23 mm Formatting of this note might be different from the original. May 2017. St Judes 23 mm   History of tobacco abuse 07/26/2015   Hyperlipidemia    Hypertension    Hypothyroidism 01/23/2016   Intermittent claudication (HCC) 07/26/2015   Morbid obesity (HCC) 12/06/2016   Nutritional and metabolic cardiomyopathy (HCC)    Paroxysmal atrial fibrillation (HCC) 03/06/2016   Paroxysmal atrial flutter (HCC) 09/01/2018   Peripheral vascular disease (HCC) 07/26/2015   Presence of automatic implantable cardioverter-defibrillator 10/23/2016   PVD (peripheral vascular disease) (HCC)    Rheumatic aortic valve insufficiency 03/05/2016   Sleep apnea    Stroke (HCC)    Type 2 diabetes mellitus with diabetic peripheral angiopathy without gangrene, without long-term current use of insulin (HCC) 11/22/2015    Past Surgical History:  Procedure Laterality Date   A-FLUTTER ABLATION N/A 11/05/2018   Procedure: A-FLUTTER ABLATION;  Surgeon: Regan Lemming, MD;  Location: [facility];  Service: Cardiovascular;  Laterality: N/A;   CARDIAC CATHETERIZATION     INSERT / REPLACE / REMOVE PACEMAKER     [employer]onic ICD   MECHANICAL AORTIC VALVE REPLACEMENT     PERIPHERAL ARTERIAL STENT GRAFT     TUBAL LIGATION      Current Medications: Current Meds  Medication Sig  albuterol (PROVENTIL HFA;VENTOLIN HFA) 108 (90 Base) MCG/ACT inhaler Inhale 1-2 puffs into the lungs every 6 (six) hours as needed for wheezing or shortness of breath.   ALPRAZolam (XANAX) 0.5 MG tablet Take 0.5 mg by mouth 3 (three) times daily as needed for anxiety.    amiodarone (PACERONE) 200 MG tablet Take 0.5 tablets (100 mg total) by mouth daily.   atorvastatin (LIPITOR) 80 MG tablet Take 80 mg by mouth daily.   carvedilol  (COREG) 25 MG tablet TAKE 1 TABLET BY MOUTH TWICE A DAY (Patient taking differently: Take 25 mg by mouth 2 (two) times daily with a meal.)   clopidogrel (PLAVIX) 75 MG tablet Take 75 mg by mouth daily after breakfast.    Cyanocobalamin (B-12) 500 MCG TABS Take 3 tablets by mouth daily.   Docusate Sodium (DSS) 100 MG CAPS Take 100 mg by mouth daily.   doxycycline (VIBRAMYCIN) 100 MG capsule Take 100 mg by mouth 2 (two) times daily.   ENTRESTO 49-51 MG TAKE 1 TABLET BY MOUTH TWICE A DAY   EPIPEN 2-PAK 0.3 MG/0.3ML SOAJ injection Inject 0.3 mg into the muscle once.    ezetimibe (ZETIA) 10 MG tablet Take 10 mg by mouth daily.   fluticasone (FLONASE) 50 MCG/ACT nasal spray Place 2 sprays into both nostrils daily as needed for allergies.    furosemide (LASIX) 20 MG tablet TAKE 3 TABLETS BY MOUTH 2 TIMES DAILY. (Patient taking differently: 60 mg 2 (two) times daily.)   gabapentin (NEURONTIN) 300 MG capsule Take 300 mg by mouth 3 (three) times daily.    Galcanezumab-gnlm (EMGALITY) 120 MG/ML SOAJ Inject 120 mg into the skin every 30 (thirty) days.   glimepiride (AMARYL) 4 MG tablet Take 4 mg by mouth daily with breakfast.    hydrALAZINE (APRESOLINE) 25 MG tablet Take 25 mg by mouth 3 (three) times daily.   HYDROcodone-acetaminophen (NORCO) 10-325 MG tablet Take 1 tablet by mouth 4 (four) times daily as needed for moderate pain or severe pain.   levothyroxine (SYNTHROID) 100 MCG tablet Take 100 mcg by mouth daily.   linaclotide (LINZESS) 72 MCG capsule Take 72 mcg by mouth daily as needed (constipation).   loratadine (CLARITIN) 10 MG tablet Take 10 mg by mouth at bedtime.    metFORMIN (GLUCOPHAGE) 500 MG tablet Take 500 mg by mouth 2 (two) times daily with a meal.    montelukast (SINGULAIR) 10 MG tablet Take 10 mg by mouth at bedtime.    Multiple Vitamins-Minerals (CENTRUM SILVER ULTRA WOMENS) TABS Take 1 tablet by mouth daily. Unknown strength   nystatin (MYCOSTATIN/NYSTOP) powder Apply 1 application  topically 3 (three) times daily. Unknown strength   omeprazole (PRILOSEC) 20 MG capsule Take 20 mg by mouth at bedtime.    ondansetron (ZOFRAN-ODT) 4 MG disintegrating tablet Take 4 mg by mouth every 6 (six) hours.   Potassium Chloride ER 20 MEQ TBCR TAKE 1 TABLET BY MOUTH EVERY DAY (Patient taking differently: Take 1 tablet by mouth daily.)   predniSONE (DELTASONE) 10 MG tablet Take 10 mg by mouth daily with breakfast.   Red Yeast Rice 600 MG TABS Take 2 tablets by mouth daily.   rosuvastatin (CRESTOR) 20 MG tablet Take 1 tablet (20 mg total) by mouth daily.   TRELEGY ELLIPTA 100-62.5-25 MCG/ACT AEPB Inhale 1 puff into the lungs daily.   TRELEGY ELLIPTA 200-62.5-25 MCG/ACT AEPB Inhale 1 puff into the lungs daily.   Ubrogepant (UBRELVY) 50 MG TABS Take 1 tablet (50 mg total) by mouth as  needed (migraine). May repeat a dose in 2 hours if needed. Max dose 2 pills in 24 hours   warfarin (COUMADIN) 4 MG tablet Take 4 mg by mouth daily.   warfarin (COUMADIN) 5 MG tablet Take 5 mg by mouth See admin instructions. Take 6 mg on Sunday, Tuesday, and  Thursday . Take 5 mg on all other days     Allergies:   Iodinated contrast media and Ioxaglate   Social History   Socioeconomic History   Marital status: Single    Spouse name: Not on file   Number of children: Not on file   Years of education: Not on file   Highest education level: Not on file  Occupational History   Not on file  Tobacco Use   Smoking status: Former   Smokeless tobacco: Never  Vaping Use   Vaping status: Never Used  Substance and Sexual Activity   Alcohol use: No   Drug use: No   Sexual activity: Not on file  Other Topics Concern   Not on file  Social History Narrative   Lives alone   Social Determinants of Health   Financial Resource Strain: Not on file  Food Insecurity: Not on file  Transportation Needs: No Transportation Needs (05/14/2019)   PRAPARE - Administrator, Civil Service (Medical): No    Lack  of Transportation (Non-Medical): No  Physical Activity: Not on file  Stress: Not on file  Social Connections: Not on file     Family History: The patient's family history includes Heart disease in her brother. ROS:   Please see the history of present illness.    All 14 point review of systems negative except as described per history of present illness  EKGs/Labs/Other Studies Reviewed:    EKG Interpretation Date/Time:  Thursday May 09 2023 10:24:15 EDT Ventricular Rate:  95 PR Interval:  214 QRS Duration:  116 QT Interval:  394 QTC Calculation: 495 R Axis:   -1  Text Interpretation: Sinus rhythm with 1st degree A-V block Prolonged QT Abnormal ECG When compared with ECG of 22-Mar-2011 11:42, Premature ventricular complexes are no longer Present Vent. rate has increased BY  34 BPM Non-specific change in ST segment in Inferior leads Non-specific change in ST segment in Lateral leads T wave inversion no longer evident in Inferior leads QT has lengthened Confirmed by Gypsy Balsam [phone]) on 05/09/2023 10:33:54 AM    Recent Labs: 08/16/2022: NT-Pro BNP 523 12/27/2022: ALT 26 03/06/2023: BUN 20; Creatinine, Ser 1.09; Magnesium 2.0; Potassium 4.3; Sodium 143  Recent Lipid Panel    Component Value Date/Time   CHOL 176 06/19/2021 1624   TRIG 108 06/19/2021 1624   HDL 48 06/19/2021 1624   CHOLHDL 3.7 06/19/2021 1624   CHOLHDL 4.3 07/04/2007 0815   VLDL 28 07/04/2007 0815   LDLCALC 108 (H) 06/19/2021 1624    Physical Exam:    VS:  BP 120/72 (BP Location: Right Arm, Patient Position: Sitting)   Pulse 95   Ht 5\' 10"  (1.778 m)   Wt (!) 328 lb (148.8 kg)   SpO2 93%   BMI 47.06 kg/m     Wt Readings from Last 3 Encounters:  05/09/23 (!) 328 lb (148.8 kg)  03/06/23 (!) 339 lb (153.8 kg)  12/27/22 (!) 337 lb (152.9 kg)     GEN:  Well nourished, well developed in no acute distress HEENT: Normal NECK: No JVD; No carotid bruits LYMPHATICS: No lymphadenopathy CARDIAC: RRR,  crisp mechanical valve sounds,  no rubs, no gallops RESPIRATORY:  Clear to auscultation without rales, wheezing or rhonchi  ABDOMEN: Soft, non-tender, non-distended MUSCULOSKELETAL:  No edema; No deformity  SKIN: Warm and dry LOWER EXTREMITIES: no swelling NEUROLOGIC:  Alert and oriented x 3 PSYCHIATRIC:  Normal affect   ASSESSMENT:    1. Paroxysmal atrial fibrillation (HCC)   2. History of mechanical aortic valve replacement   3. Dyspnea on exertion   4. Dyslipidemia   5. Coronary artery disease involving native coronary artery of native heart without angina pectoris   6. Dilated cardiomyopathy (HCC)    PLAN:    In order of problems listed above:  Paroxysmal atrial fibrillation.  She is anticoag which I will continue.  I will send a message to our EP team request to interrogate her device tonight to see if any rate occurrence of atrial fibrillation has been recorded on the device.  If not she may be forced to worsen monitor to try to figure out exactly what can of arrhythmia with dealing with. History of aortic valve replacement with mechanical prosthesis.  I will schedule him to have echocardiogram to check the function of the valve. He is due for cardiomyopathy with little his latest deterioration again echocardiogram will be done to check left ventricle ejection fraction. Coronary disease.  Stable.   Medication Adjustments/Labs and Tests Ordered: Current medicines are reviewed at length with the patient today.  Concerns regarding medicines are outlined above.  Orders Placed This Encounter  Procedures   EKG 12-Lead   Medication changes: No orders of the defined types were placed in this encounter.   Signed, Georgeanna Lea, MD, [facility] 05/09/2023 10:42 AM    [facility]

## 2023-05-09 NOTE — Addendum Note (Signed)
Addended by: Baldo Ash D on: 05/09/2023 10:53 AM   Modules accepted: Orders

## 2023-05-09 NOTE — Patient Instructions (Signed)
Medication Instructions:  Your physician recommends that you continue on your current medications as directed. Please refer to the Current Medication list given to you today.  *If you need a refill on your cardiac medications before your next appointment, please call your pharmacy*   Lab Work: None Ordered If you have labs (blood work) drawn today and your tests are completely normal, you will receive your results only by: MyChart Message (if you have MyChart) OR A paper copy in the mail If you have any lab test that is abnormal or we need to change your treatment, we will call you to review the results.   Testing/Procedures: Your physician has requested that you have an echocardiogram. Echocardiography is a painless test that uses sound waves to create images of your heart. It provides your doctor with information about the size and shape of your heart and how well your heart's chambers and valves are working. This procedure takes approximately one hour. There are no restrictions for this procedure. Please do NOT wear cologne, perfume, aftershave, or lotions (deodorant is allowed). Please arrive 15 minutes prior to your appointment time.      Follow-Up: At [facility], you and your health needs are our priority.  As part of our continuing mission to provide you with exceptional heart care, we have created designated Provider Care Teams.  These Care Teams include your primary Cardiologist (physician) and Advanced Practice Providers (APPs -  Physician Assistants and Nurse Practitioners) who all work together to provide you with the care you need, when you need it.  We recommend signing up for the patient portal called "MyChart".  Sign up information is provided on this After Visit Summary.  MyChart is used to connect with patients for Virtual Visits (Telemedicine).  Patients are able to view lab/test results, encounter notes, upcoming appointments, etc.  Non-urgent messages can be sent to  your provider as well.   To learn more about what you can do with MyChart, go to [email or web address].    Your next appointment:   6 month(s)  The format for your next appointment:   In Person  Provider:   Robert Krasowski, MD    Other Instructions NA  

## 2023-05-15 ENCOUNTER — Ambulatory Visit: Payer: 59 | Admitting: Podiatry

## 2023-05-16 DIAGNOSIS — R791 Abnormal coagulation profile: Secondary | ICD-10-CM | POA: Diagnosis not present

## 2023-05-21 ENCOUNTER — Ambulatory Visit (INDEPENDENT_AMBULATORY_CARE_PROVIDER_SITE_OTHER): Payer: 59 | Admitting: Podiatry

## 2023-05-21 DIAGNOSIS — M79675 Pain in left toe(s): Secondary | ICD-10-CM | POA: Diagnosis not present

## 2023-05-21 DIAGNOSIS — B351 Tinea unguium: Secondary | ICD-10-CM

## 2023-05-21 DIAGNOSIS — M79674 Pain in right toe(s): Secondary | ICD-10-CM

## 2023-05-21 DIAGNOSIS — E1151 Type 2 diabetes mellitus with diabetic peripheral angiopathy without gangrene: Secondary | ICD-10-CM

## 2023-05-21 NOTE — Progress Notes (Addendum)
  Subjective:  Patient ID: Shannon Baxter, female    DOB: 09/05/1961,  MRN: [id number]  [facility]   62 y.o. female presents with the above complaint. History confirmed with patient. Patient presenting with pain related to dystrophic thickened elongated nails. Patient is unable to trim own nails related to nail dystrophy and/or mobility issues. Patient does have a history of T2DM.   Objective:  Physical Exam: warm, good capillary refill nail exam onychomycosis of the toenails, onycholysis, and dystrophic nails DP pulses palpable, PT pulses palpable, and protective sensation absent Left Foot:  Pain with palpation of nails due to elongation and dystrophic growth.  Right Foot: Pain with palpation of nails due to elongation and dystrophic growth.   Assessment:   1. Pain due to onychomycosis of toenails of both feet   2. Type II diabetes mellitus with peripheral circulatory disorder [facility])      Plan:  Patient was evaluated and treated and all questions answered.  #Onychomycosis with pain  -Nails palliatively debrided as below. -Educated on self-care  Procedure: Nail Debridement Rationale: Pain Type of Debridement: manual, sharp debridement. Instrumentation: Nail nipper, rotary burr. Number of Nails: 10  Return in about 3 months (around 08/21/2023) for [facility].         Corinna Gab, DPM [facility]

## 2023-05-23 DIAGNOSIS — R791 Abnormal coagulation profile: Secondary | ICD-10-CM | POA: Diagnosis not present

## 2023-05-28 ENCOUNTER — Ambulatory Visit (INDEPENDENT_AMBULATORY_CARE_PROVIDER_SITE_OTHER): Payer: 59

## 2023-05-28 DIAGNOSIS — I480 Paroxysmal atrial fibrillation: Secondary | ICD-10-CM | POA: Diagnosis not present

## 2023-06-05 ENCOUNTER — Other Ambulatory Visit: Payer: 59

## 2023-06-06 DIAGNOSIS — G4733 Obstructive sleep apnea (adult) (pediatric): Secondary | ICD-10-CM | POA: Diagnosis not present

## 2023-06-06 DIAGNOSIS — R791 Abnormal coagulation profile: Secondary | ICD-10-CM | POA: Diagnosis not present

## 2023-06-13 DIAGNOSIS — R791 Abnormal coagulation profile: Secondary | ICD-10-CM | POA: Diagnosis not present

## 2023-06-13 NOTE — Progress Notes (Signed)
Remote ICD transmission.   

## 2023-06-16 ENCOUNTER — Other Ambulatory Visit: Payer: Self-pay | Admitting: Cardiology

## 2023-06-20 DIAGNOSIS — R791 Abnormal coagulation profile: Secondary | ICD-10-CM | POA: Diagnosis not present

## 2023-06-27 DIAGNOSIS — R791 Abnormal coagulation profile: Secondary | ICD-10-CM | POA: Diagnosis not present

## 2023-06-28 ENCOUNTER — Other Ambulatory Visit: Payer: Self-pay | Admitting: Cardiology

## 2023-06-28 NOTE — Telephone Encounter (Signed)
Rx refill sent to pharmacy. 

## 2023-07-04 DIAGNOSIS — R791 Abnormal coagulation profile: Secondary | ICD-10-CM | POA: Diagnosis not present

## 2023-07-05 ENCOUNTER — Ambulatory Visit: Payer: 59 | Attending: Cardiology

## 2023-07-05 DIAGNOSIS — R0609 Other forms of dyspnea: Secondary | ICD-10-CM | POA: Diagnosis not present

## 2023-07-05 LAB — ECHOCARDIOGRAM COMPLETE
AR max vel: 1.11 cm2
AV Area VTI: 1.17 cm2
AV Area mean vel: 1.16 cm2
AV Mean grad: 15 mmHg
AV Peak grad: 31.8 mmHg
Ao pk vel: 2.82 m/s
Area-P 1/2: 3.72 cm2
S' Lateral: 6.1 cm

## 2023-07-09 ENCOUNTER — Telehealth: Payer: Self-pay

## 2023-07-09 NOTE — Telephone Encounter (Signed)
Marland Kitchen  jpl

## 2023-07-09 NOTE — Telephone Encounter (Signed)
-----   Message from Gypsy Balsam sent at 07/09/2023  1:48 PM EDT ----- Echocardiogram showed ejection fraction 30 to 35% which is about like it was before.  Prosthetic valve functioning properly continue present management

## 2023-07-10 ENCOUNTER — Telehealth: Payer: Self-pay

## 2023-07-10 NOTE — Telephone Encounter (Signed)
-----   Message from [facility] Silvie Obremski P sent at 07/09/2023  2:31 PM EDT -----  ----- Message ----- From: Georgeanna Lea, MD Sent: 07/09/2023   1:48 PM EDT To: Neena Rhymes, RN  Echocardiogram showed ejection fraction 30 to 35% which is about like it was before.  Prosthetic valve functioning properly continue present management

## 2023-07-10 NOTE — Telephone Encounter (Signed)
Message addressed.

## 2023-07-10 NOTE — Telephone Encounter (Signed)
Patient notified of results and verbalized understanding.  

## 2023-07-10 NOTE — Telephone Encounter (Signed)
Pt was returning CMA's call regarding results and now she's requesting a callback. Please advise

## 2023-07-11 DIAGNOSIS — R791 Abnormal coagulation profile: Secondary | ICD-10-CM | POA: Diagnosis not present

## 2023-07-18 DIAGNOSIS — J4540 Moderate persistent asthma, uncomplicated: Secondary | ICD-10-CM | POA: Diagnosis not present

## 2023-07-18 DIAGNOSIS — G4733 Obstructive sleep apnea (adult) (pediatric): Secondary | ICD-10-CM | POA: Diagnosis not present

## 2023-07-18 DIAGNOSIS — J301 Allergic rhinitis due to pollen: Secondary | ICD-10-CM | POA: Diagnosis not present

## 2023-07-18 DIAGNOSIS — R791 Abnormal coagulation profile: Secondary | ICD-10-CM | POA: Diagnosis not present

## 2023-07-21 ENCOUNTER — Other Ambulatory Visit: Payer: Self-pay | Admitting: Cardiology

## 2023-07-22 DIAGNOSIS — R928 Other abnormal and inconclusive findings on diagnostic imaging of breast: Secondary | ICD-10-CM | POA: Diagnosis not present

## 2023-07-22 DIAGNOSIS — Z1231 Encounter for screening mammogram for malignant neoplasm of breast: Secondary | ICD-10-CM | POA: Diagnosis not present

## 2023-07-23 DIAGNOSIS — J4540 Moderate persistent asthma, uncomplicated: Secondary | ICD-10-CM | POA: Diagnosis not present

## 2023-07-25 DIAGNOSIS — R791 Abnormal coagulation profile: Secondary | ICD-10-CM | POA: Diagnosis not present

## 2023-08-01 DIAGNOSIS — R791 Abnormal coagulation profile: Secondary | ICD-10-CM | POA: Diagnosis not present

## 2023-08-08 DIAGNOSIS — R791 Abnormal coagulation profile: Secondary | ICD-10-CM | POA: Diagnosis not present

## 2023-08-08 DIAGNOSIS — E1142 Type 2 diabetes mellitus with diabetic polyneuropathy: Secondary | ICD-10-CM | POA: Diagnosis not present

## 2023-08-08 DIAGNOSIS — I110 Hypertensive heart disease with heart failure: Secondary | ICD-10-CM | POA: Diagnosis not present

## 2023-08-08 DIAGNOSIS — E785 Hyperlipidemia, unspecified: Secondary | ICD-10-CM | POA: Diagnosis not present

## 2023-08-08 DIAGNOSIS — I4891 Unspecified atrial fibrillation: Secondary | ICD-10-CM | POA: Diagnosis not present

## 2023-08-08 DIAGNOSIS — J449 Chronic obstructive pulmonary disease, unspecified: Secondary | ICD-10-CM | POA: Diagnosis not present

## 2023-08-08 DIAGNOSIS — E039 Hypothyroidism, unspecified: Secondary | ICD-10-CM | POA: Diagnosis not present

## 2023-08-14 DIAGNOSIS — R928 Other abnormal and inconclusive findings on diagnostic imaging of breast: Secondary | ICD-10-CM | POA: Diagnosis not present

## 2023-08-14 DIAGNOSIS — R791 Abnormal coagulation profile: Secondary | ICD-10-CM | POA: Diagnosis not present

## 2023-08-14 DIAGNOSIS — R92313 Mammographic fatty tissue density, bilateral breasts: Secondary | ICD-10-CM | POA: Diagnosis not present

## 2023-08-20 ENCOUNTER — Ambulatory Visit (INDEPENDENT_AMBULATORY_CARE_PROVIDER_SITE_OTHER): Payer: 59 | Admitting: Podiatry

## 2023-08-20 DIAGNOSIS — Z91199 Patient's noncompliance with other medical treatment and regimen due to unspecified reason: Secondary | ICD-10-CM

## 2023-08-20 NOTE — Progress Notes (Signed)
 Patient absent for apointment

## 2023-08-23 DIAGNOSIS — J4540 Moderate persistent asthma, uncomplicated: Secondary | ICD-10-CM | POA: Diagnosis not present

## 2023-08-27 ENCOUNTER — Ambulatory Visit (INDEPENDENT_AMBULATORY_CARE_PROVIDER_SITE_OTHER): Payer: 59

## 2023-08-27 DIAGNOSIS — I420 Dilated cardiomyopathy: Secondary | ICD-10-CM | POA: Diagnosis not present

## 2023-08-27 LAB — CUP PACEART REMOTE DEVICE CHECK
Battery Remaining Longevity: 40 mo
Battery Voltage: 2.96 V
Brady Statistic AP VP Percent: 0 %
Brady Statistic AP VS Percent: 0.75 %
Brady Statistic AS VP Percent: 0.04 %
Brady Statistic AS VS Percent: 99.21 %
Brady Statistic RA Percent Paced: 0.75 %
Brady Statistic RV Percent Paced: 0.05 %
Date Time Interrogation Session: 20241105052724
HighPow Impedance: 86 Ohm
Implantable Lead Connection Status: 753985
Implantable Lead Connection Status: 753985
Implantable Lead Implant Date: 20180102
Implantable Lead Implant Date: 20180102
Implantable Lead Location: 753859
Implantable Lead Location: 753860
Implantable Lead Model: 5076
Implantable Pulse Generator Implant Date: 20180102
Lead Channel Impedance Value: 342 Ohm
Lead Channel Impedance Value: 437 Ohm
Lead Channel Impedance Value: 456 Ohm
Lead Channel Pacing Threshold Amplitude: 0.375 V
Lead Channel Pacing Threshold Amplitude: 1.125 V
Lead Channel Pacing Threshold Pulse Width: 0.4 ms
Lead Channel Pacing Threshold Pulse Width: 0.4 ms
Lead Channel Sensing Intrinsic Amplitude: 28.25 mV
Lead Channel Sensing Intrinsic Amplitude: 28.25 mV
Lead Channel Sensing Intrinsic Amplitude: 3.625 mV
Lead Channel Sensing Intrinsic Amplitude: 3.625 mV
Lead Channel Setting Pacing Amplitude: 1.5 V
Lead Channel Setting Pacing Amplitude: 2.25 V
Lead Channel Setting Pacing Pulse Width: 0.4 ms
Lead Channel Setting Sensing Sensitivity: 0.3 mV
Zone Setting Status: 755011
Zone Setting Status: 755011

## 2023-08-28 DIAGNOSIS — R791 Abnormal coagulation profile: Secondary | ICD-10-CM | POA: Diagnosis not present

## 2023-08-28 DIAGNOSIS — Z5321 Procedure and treatment not carried out due to patient leaving prior to being seen by health care provider: Secondary | ICD-10-CM | POA: Diagnosis not present

## 2023-08-30 DIAGNOSIS — R791 Abnormal coagulation profile: Secondary | ICD-10-CM | POA: Diagnosis not present

## 2023-09-02 DIAGNOSIS — G4733 Obstructive sleep apnea (adult) (pediatric): Secondary | ICD-10-CM | POA: Diagnosis not present

## 2023-09-04 DIAGNOSIS — B9689 Other specified bacterial agents as the cause of diseases classified elsewhere: Secondary | ICD-10-CM | POA: Diagnosis not present

## 2023-09-04 DIAGNOSIS — J208 Acute bronchitis due to other specified organisms: Secondary | ICD-10-CM | POA: Diagnosis not present

## 2023-09-04 DIAGNOSIS — J019 Acute sinusitis, unspecified: Secondary | ICD-10-CM | POA: Diagnosis not present

## 2023-09-04 DIAGNOSIS — R791 Abnormal coagulation profile: Secondary | ICD-10-CM | POA: Diagnosis not present

## 2023-09-11 DIAGNOSIS — R791 Abnormal coagulation profile: Secondary | ICD-10-CM | POA: Diagnosis not present

## 2023-09-12 DIAGNOSIS — G4733 Obstructive sleep apnea (adult) (pediatric): Secondary | ICD-10-CM | POA: Diagnosis not present

## 2023-09-12 DIAGNOSIS — J301 Allergic rhinitis due to pollen: Secondary | ICD-10-CM | POA: Diagnosis not present

## 2023-09-12 DIAGNOSIS — J4540 Moderate persistent asthma, uncomplicated: Secondary | ICD-10-CM | POA: Diagnosis not present

## 2023-09-16 ENCOUNTER — Ambulatory Visit: Payer: 59 | Admitting: Podiatry

## 2023-09-22 DIAGNOSIS — J4540 Moderate persistent asthma, uncomplicated: Secondary | ICD-10-CM | POA: Diagnosis not present

## 2023-09-23 ENCOUNTER — Ambulatory Visit (INDEPENDENT_AMBULATORY_CARE_PROVIDER_SITE_OTHER): Payer: 59 | Admitting: Podiatry

## 2023-09-23 ENCOUNTER — Encounter: Payer: Self-pay | Admitting: Podiatry

## 2023-09-23 DIAGNOSIS — M79674 Pain in right toe(s): Secondary | ICD-10-CM | POA: Diagnosis not present

## 2023-09-23 DIAGNOSIS — B351 Tinea unguium: Secondary | ICD-10-CM | POA: Diagnosis not present

## 2023-09-23 DIAGNOSIS — E1151 Type 2 diabetes mellitus with diabetic peripheral angiopathy without gangrene: Secondary | ICD-10-CM

## 2023-09-23 DIAGNOSIS — E119 Type 2 diabetes mellitus without complications: Secondary | ICD-10-CM

## 2023-09-23 DIAGNOSIS — L84 Corns and callosities: Secondary | ICD-10-CM | POA: Diagnosis not present

## 2023-09-23 DIAGNOSIS — M21619 Bunion of unspecified foot: Secondary | ICD-10-CM

## 2023-09-23 DIAGNOSIS — M79675 Pain in left toe(s): Secondary | ICD-10-CM

## 2023-09-24 NOTE — Progress Notes (Signed)
Remote ICD transmission.   

## 2023-09-25 DIAGNOSIS — R791 Abnormal coagulation profile: Secondary | ICD-10-CM | POA: Diagnosis not present

## 2023-09-25 DIAGNOSIS — Z23 Encounter for immunization: Secondary | ICD-10-CM | POA: Diagnosis not present

## 2023-10-02 DIAGNOSIS — R791 Abnormal coagulation profile: Secondary | ICD-10-CM | POA: Diagnosis not present

## 2023-10-09 DIAGNOSIS — R791 Abnormal coagulation profile: Secondary | ICD-10-CM | POA: Diagnosis not present

## 2023-10-09 DIAGNOSIS — E119 Type 2 diabetes mellitus without complications: Secondary | ICD-10-CM | POA: Diagnosis not present

## 2023-10-09 LAB — HM DIABETES EYE EXAM

## 2023-10-18 DIAGNOSIS — R791 Abnormal coagulation profile: Secondary | ICD-10-CM | POA: Diagnosis not present

## 2023-10-18 DIAGNOSIS — L02811 Cutaneous abscess of head [any part, except face]: Secondary | ICD-10-CM | POA: Diagnosis not present

## 2023-10-23 DIAGNOSIS — J4540 Moderate persistent asthma, uncomplicated: Secondary | ICD-10-CM | POA: Diagnosis not present

## 2023-10-24 DIAGNOSIS — L02811 Cutaneous abscess of head [any part, except face]: Secondary | ICD-10-CM | POA: Diagnosis not present

## 2023-10-24 DIAGNOSIS — R791 Abnormal coagulation profile: Secondary | ICD-10-CM | POA: Diagnosis not present

## 2023-10-24 DIAGNOSIS — Z952 Presence of prosthetic heart valve: Secondary | ICD-10-CM | POA: Diagnosis not present

## 2023-10-24 DIAGNOSIS — I4891 Unspecified atrial fibrillation: Secondary | ICD-10-CM | POA: Diagnosis not present

## 2023-10-30 DIAGNOSIS — D485 Neoplasm of uncertain behavior of skin: Secondary | ICD-10-CM | POA: Diagnosis not present

## 2023-10-31 DIAGNOSIS — R791 Abnormal coagulation profile: Secondary | ICD-10-CM | POA: Diagnosis not present

## 2023-11-05 ENCOUNTER — Other Ambulatory Visit: Payer: 59

## 2023-11-06 ENCOUNTER — Other Ambulatory Visit: Payer: Self-pay | Admitting: Cardiology

## 2023-11-13 DIAGNOSIS — J449 Chronic obstructive pulmonary disease, unspecified: Secondary | ICD-10-CM | POA: Diagnosis not present

## 2023-11-13 DIAGNOSIS — E1142 Type 2 diabetes mellitus with diabetic polyneuropathy: Secondary | ICD-10-CM | POA: Diagnosis not present

## 2023-11-13 DIAGNOSIS — R791 Abnormal coagulation profile: Secondary | ICD-10-CM | POA: Diagnosis not present

## 2023-11-13 DIAGNOSIS — R928 Other abnormal and inconclusive findings on diagnostic imaging of breast: Secondary | ICD-10-CM | POA: Diagnosis not present

## 2023-11-13 DIAGNOSIS — E785 Hyperlipidemia, unspecified: Secondary | ICD-10-CM | POA: Diagnosis not present

## 2023-11-13 DIAGNOSIS — I110 Hypertensive heart disease with heart failure: Secondary | ICD-10-CM | POA: Diagnosis not present

## 2023-11-13 DIAGNOSIS — E039 Hypothyroidism, unspecified: Secondary | ICD-10-CM | POA: Diagnosis not present

## 2023-11-13 DIAGNOSIS — I4891 Unspecified atrial fibrillation: Secondary | ICD-10-CM | POA: Diagnosis not present

## 2023-11-14 LAB — LAB REPORT - SCANNED
A1c: 6
EGFR: 61

## 2023-11-18 ENCOUNTER — Ambulatory Visit: Payer: 59 | Attending: Cardiology | Admitting: Cardiology

## 2023-11-18 VITALS — BP 132/72 | HR 74 | Ht 72.0 in | Wt 336.0 lb

## 2023-11-18 DIAGNOSIS — I480 Paroxysmal atrial fibrillation: Secondary | ICD-10-CM | POA: Diagnosis not present

## 2023-11-18 DIAGNOSIS — E785 Hyperlipidemia, unspecified: Secondary | ICD-10-CM

## 2023-11-18 DIAGNOSIS — I70213 Atherosclerosis of native arteries of extremities with intermittent claudication, bilateral legs: Secondary | ICD-10-CM | POA: Diagnosis not present

## 2023-11-18 DIAGNOSIS — I420 Dilated cardiomyopathy: Secondary | ICD-10-CM

## 2023-11-18 MED ORDER — ROSUVASTATIN CALCIUM 40 MG PO TABS: 40.0000 mg | ORAL_TABLET | Freq: Every day | ORAL | 3 refills | Status: AC

## 2023-11-18 NOTE — Patient Instructions (Addendum)
Medication Instructions:    Increase your Crestor to 40 mg daily.   *If you need a refill on your cardiac medications before your next appointment, please call your pharmacy*   Lab Work: Your physician recommends that you return for lab work in: 6 weeks  You need to have labs done when you are fasting.  You can come Monday through Friday 8:30 am to 12:00 pm and 1:15 to 4:30. You do not need to make an appointment as the order has already been placed. The labs you are going to have done are Lipids.  If you have labs (blood work) drawn today and your tests are completely normal, you will receive your results only by: MyChart Message (if you have MyChart) OR A paper copy in the mail If you have any lab test that is abnormal or we need to change your treatment, we will call you to review the results.   Testing/Procedures: None Ordered   Follow-Up: At [facility], you and your health needs are our priority.  As part of our continuing mission to provide you with exceptional heart care, we have created designated Provider Care Teams.  These Care Teams include your primary Cardiologist (physician) and Advanced Practice Providers (APPs -  Physician Assistants and Nurse Practitioners) who all work together to provide you with the care you need, when you need it.  We recommend signing up for the patient portal called "MyChart".  Sign up information is provided on this After Visit Summary.  MyChart is used to connect with patients for Virtual Visits (Telemedicine).  Patients are able to view lab/test results, encounter notes, upcoming appointments, etc.  Non-urgent messages can be sent to your provider as well.   To learn more about what you can do with MyChart, go to [email or web address].    Your next appointment:   6 month follow up

## 2023-11-18 NOTE — Progress Notes (Signed)
Cardiology Office Note:    Date:  11/18/2023   ID:  Shannon Baxter, DOB 1961/08/09, MRN [id number]  PCP:  Paulina Fusi, MD  Cardiologist:  Gypsy Balsam, MD    Referring MD: Paulina Fusi, MD   Chief Complaint  Patient presents with   Follow-up    History of Present Illness:    Shannon Baxter is a 63 y.o. female past medical history significant for arctic valve replacement with 23 mm Jude prosthetic valve done in May 2017, cardiomyopathy ejection fraction 35%, paroxysmal atrial fibrillation,, obesity, BiV pacer.  Comes today to months for follow-up overall she seems to be doing well.  She denies have any chest pain tightness squeezing pressure burning chest.  She is upset with herself because she gained weight during the Christmas time or sitting at home or because of cold weather.  Denies having other symptoms  Past Medical History:  Diagnosis Date   Acute on chronic systolic heart failure (HCC) 02/21/2016   Allergy    Aortic insufficiency    Aortic stenosis    Arthritis    Asthma    Atherosclerosis of native arteries of extremities with intermittent claudication, bilateral legs (HCC) 08/04/2015   Carotid atherosclerosis 07/26/2015   CHF (congestive heart failure) (HCC)    Chronic systolic congestive heart failure (HCC) 12/06/2016   Claudication (HCC)    Congestive heart failure (CHF) (HCC)    Coronary artery disease    Coronary artery disease involving native coronary artery of native heart without angina pectoris 07/26/2015   Diabetes mellitus without complication (HCC)    Dilated cardiomyopathy (HCC) 09/03/2016   Overview:  Ejection fraction 20-25%   Dyslipidemia 07/26/2015   Dyspnea on exertion 10/26/2015   Essential hypertension 07/26/2015   GERD (gastroesophageal reflux disease)    Heart murmur    History of mechanical aortic valve replacement 03/29/2016   Overview:  May 2017. St Judes 23 mm  Formatting of this note might be different from the original.  Overview:  May 2017. St Judes 23 mm Formatting of this note might be different from the original. May 2017. St Judes 23 mm   History of tobacco abuse 07/26/2015   Hyperlipidemia    Hypertension    Hypothyroidism 01/23/2016   Intermittent claudication (HCC) 07/26/2015   Morbid obesity (HCC) 12/06/2016   Nutritional and metabolic cardiomyopathy (HCC)    Paroxysmal atrial fibrillation (HCC) 03/06/2016   Paroxysmal atrial flutter (HCC) 09/01/2018   Peripheral vascular disease (HCC) 07/26/2015   Presence of automatic implantable cardioverter-defibrillator 10/23/2016   PVD (peripheral vascular disease) (HCC)    Rheumatic aortic valve insufficiency 03/05/2016   Sleep apnea    Stroke (HCC)    Type 2 diabetes mellitus with diabetic peripheral angiopathy without gangrene, without long-term current use of insulin (HCC) 11/22/2015    Past Surgical History:  Procedure Laterality Date   A-FLUTTER ABLATION N/A 11/05/2018   Procedure: A-FLUTTER ABLATION;  Surgeon: Regan Lemming, MD;  Location: [facility];  Service: Cardiovascular;  Laterality: N/A;   CARDIAC CATHETERIZATION     INSERT / REPLACE / REMOVE PACEMAKER     Medtronic ICD   MECHANICAL AORTIC VALVE REPLACEMENT     PERIPHERAL ARTERIAL STENT GRAFT     TUBAL LIGATION      Current Medications: Current Meds  Medication Sig   albuterol (PROVENTIL HFA;VENTOLIN HFA) 108 (90 Base) MCG/ACT inhaler Inhale 1-2 puffs into the lungs every 6 (six) hours as needed for wheezing or shortness of breath.  ALPRAZolam (XANAX) 0.5 MG tablet Take 0.5 mg by mouth 3 (three) times daily as needed for anxiety.    amiodarone (PACERONE) 200 MG tablet TAKE 1/2 TABLET BY MOUTH DAILY   atorvastatin (LIPITOR) 80 MG tablet Take 80 mg by mouth daily.   carvedilol (COREG) 25 MG tablet TAKE 1 TABLET BY MOUTH TWICE A DAY (Patient taking differently: Take 25 mg by mouth 2 (two) times daily with a meal.)   clopidogrel (PLAVIX) 75 MG tablet Take 75 mg by mouth  daily after breakfast.    Cyanocobalamin (B-12) 500 MCG TABS Take 3 tablets by mouth daily.   Docusate Sodium (DSS) 100 MG CAPS Take 100 mg by mouth daily.   doxycycline (VIBRAMYCIN) 100 MG capsule Take 100 mg by mouth 2 (two) times daily.   ENTRESTO 49-51 MG TAKE 1 TABLET BY MOUTH TWICE A DAY   EPIPEN 2-PAK 0.3 MG/0.3ML SOAJ injection Inject 0.3 mg into the muscle once.    ezetimibe (ZETIA) 10 MG tablet Take 10 mg by mouth daily.   fluticasone (FLONASE) 50 MCG/ACT nasal spray Place 2 sprays into both nostrils daily as needed for allergies.    furosemide (LASIX) 20 MG tablet TAKE 3 TABLETS BY MOUTH 2 TIMES DAILY. (Patient taking differently: Take 60 mg by mouth 2 (two) times daily.)   gabapentin (NEURONTIN) 300 MG capsule Take 300 mg by mouth 3 (three) times daily.    Galcanezumab-gnlm (EMGALITY) 120 MG/ML SOAJ Inject 120 mg into the skin every 30 (thirty) days.   glimepiride (AMARYL) 4 MG tablet Take 4 mg by mouth daily with breakfast.    hydrALAZINE (APRESOLINE) 25 MG tablet Take 25 mg by mouth 3 (three) times daily.   HYDROcodone-acetaminophen (NORCO) 10-325 MG tablet Take 1 tablet by mouth 4 (four) times daily as needed for moderate pain or severe pain.   levothyroxine (SYNTHROID) 100 MCG tablet Take 100 mcg by mouth daily.   linaclotide (LINZESS) 72 MCG capsule Take 72 mcg by mouth daily as needed (constipation).   loratadine (CLARITIN) 10 MG tablet Take 10 mg by mouth at bedtime.    metFORMIN (GLUCOPHAGE) 500 MG tablet Take 500 mg by mouth 2 (two) times daily with a meal.    montelukast (SINGULAIR) 10 MG tablet Take 10 mg by mouth at bedtime.    Multiple Vitamins-Minerals (CENTRUM SILVER ULTRA WOMENS) TABS Take 1 tablet by mouth daily. Unknown strength   nystatin (MYCOSTATIN/NYSTOP) powder Apply 1 application topically 3 (three) times daily. Unknown strength   omeprazole (PRILOSEC) 20 MG capsule Take 20 mg by mouth at bedtime.    ondansetron (ZOFRAN-ODT) 4 MG disintegrating tablet Take 4  mg by mouth every 6 (six) hours.   Potassium Chloride ER 20 MEQ TBCR TAKE 1 TABLET BY MOUTH EVERY DAY (Patient taking differently: Take 1 tablet by mouth daily.)   predniSONE (DELTASONE) 10 MG tablet Take 10 mg by mouth daily with breakfast.   Red Yeast Rice 600 MG TABS Take 2 tablets by mouth daily.   rosuvastatin (CRESTOR) 40 MG tablet Take 1 tablet (40 mg total) by mouth daily.   spironolactone (ALDACTONE) 25 MG tablet TAKE 1/2 TABLET BY MOUTH EVERY DAY   TRELEGY ELLIPTA 100-62.5-25 MCG/ACT AEPB Inhale 1 puff into the lungs daily.   TRELEGY ELLIPTA 200-62.5-25 MCG/ACT AEPB Inhale 1 puff into the lungs daily.   Ubrogepant (UBRELVY) 50 MG TABS Take 1 tablet (50 mg total) by mouth as needed (migraine). May repeat a dose in 2 hours if needed. Max dose 2 pills  in 24 hours   warfarin (COUMADIN) 4 MG tablet Take 4 mg by mouth daily.   warfarin (COUMADIN) 5 MG tablet Take 5 mg by mouth See admin instructions. Take 6 mg on Sunday, Tuesday, and  Thursday . Take 5 mg on all other days   [DISCONTINUED] rosuvastatin (CRESTOR) 20 MG tablet TAKE 1 TABLET BY MOUTH EVERY DAY     Allergies:   Iodinated contrast media and Ioxaglate   Social History   Socioeconomic History   Marital status: Single    Spouse name: Not on file   Number of children: Not on file   Years of education: Not on file   Highest education level: Not on file  Occupational History   Not on file  Tobacco Use   Smoking status: Former   Smokeless tobacco: Never  Vaping Use   Vaping status: Never Used  Substance and Sexual Activity   Alcohol use: No   Drug use: No   Sexual activity: Not on file  Other Topics Concern   Not on file  Social History Narrative   Lives alone   Social Drivers of Health   Financial Resource Strain: Not on file  Food Insecurity: Not on file  Transportation Needs: No Transportation Needs (05/14/2019)   PRAPARE - Administrator, Civil Service (Medical): No    Lack of Transportation  (Non-Medical): No  Physical Activity: Not on file  Stress: Not on file  Social Connections: Not on file     Family History: The patient's family history includes Heart disease in her brother. ROS:   Please see the history of present illness.    All 14 point review of systems negative except as described per history of present illness  EKGs/Labs/Other Studies Reviewed:         Recent Labs: 12/27/2022: ALT 26 03/06/2023: BUN 20; Creatinine, Ser 1.09; Magnesium 2.0; Potassium 4.3; Sodium 143  Recent Lipid Panel    Component Value Date/Time   CHOL 176 06/19/2021 1624   TRIG 108 06/19/2021 1624   HDL 48 06/19/2021 1624   CHOLHDL 3.7 06/19/2021 1624   CHOLHDL 4.3 07/04/2007 0815   VLDL 28 07/04/2007 0815   LDLCALC 108 (H) 06/19/2021 1624    Physical Exam:    VS:  BP 132/72   Pulse 74   Ht 6' (1.829 m)   Wt (!) 336 lb (152.4 kg)   SpO2 96%   BMI 45.57 kg/m     Wt Readings from Last 3 Encounters:  11/18/23 (!) 336 lb (152.4 kg)  05/09/23 (!) 328 lb (148.8 kg)  03/06/23 (!) 339 lb (153.8 kg)     GEN:  Well nourished, well developed in no acute distress HEENT: Normal NECK: No JVD; No carotid bruits LYMPHATICS: No lymphadenopathy CARDIAC: RRR, crisp mechanical valve sounds no murmurs, no rubs, no gallops RESPIRATORY:  Clear to auscultation without rales, wheezing or rhonchi  ABDOMEN: Soft, non-tender, non-distended MUSCULOSKELETAL:  No edema; No deformity  SKIN: Warm and dry LOWER EXTREMITIES: no swelling NEUROLOGIC:  Alert and oriented x 3 PSYCHIATRIC:  Normal affect   ASSESSMENT:    1. Dyslipidemia   2. Paroxysmal atrial fibrillation (HCC)   3. Dilated cardiomyopathy (HCC)   4. Atherosclerosis of native arteries of extremities with intermittent claudication, bilateral legs (HCC)   5. Morbid obesity (HCC)    PLAN:    In order of problems listed above:  History of cardiomyopathy guideline directed medical therapy.  Will schedule her to have another  echocardiogram in the summertime. Paroxysmal atrial fibrillation the TV interrogation of his device showed episodes of atrial fibrillation. Morbid obesity obviously a problem she gained few pounds around Christmas time she is very disappointed understandably. Dyslipidemia I did review K PN which show me LDL 115 HDL 49.  Will increase dose of Crestor from 20 to 40 mg daily.  Fasting lipid profile AST ALT will be done   Medication Adjustments/Labs and Tests Ordered: Current medicines are reviewed at length with the patient today.  Concerns regarding medicines are outlined above.  Orders Placed This Encounter  Procedures   Lipid panel   Medication changes:  Meds ordered this encounter  Medications   rosuvastatin (CRESTOR) 40 MG tablet    Sig: Take 1 tablet (40 mg total) by mouth daily.    Dispense:  90 tablet    Refill:  3    Signed, Georgeanna Lea, MD, [facility] 11/18/2023 11:41 AM    [facility]

## 2023-11-20 DIAGNOSIS — R791 Abnormal coagulation profile: Secondary | ICD-10-CM | POA: Diagnosis not present

## 2023-11-23 DIAGNOSIS — J4540 Moderate persistent asthma, uncomplicated: Secondary | ICD-10-CM | POA: Diagnosis not present

## 2023-11-26 ENCOUNTER — Ambulatory Visit (INDEPENDENT_AMBULATORY_CARE_PROVIDER_SITE_OTHER): Payer: Medicare Other

## 2023-11-26 DIAGNOSIS — I480 Paroxysmal atrial fibrillation: Secondary | ICD-10-CM

## 2023-11-26 DIAGNOSIS — I420 Dilated cardiomyopathy: Secondary | ICD-10-CM | POA: Diagnosis not present

## 2023-11-27 DIAGNOSIS — R791 Abnormal coagulation profile: Secondary | ICD-10-CM | POA: Diagnosis not present

## 2023-11-27 LAB — CUP PACEART REMOTE DEVICE CHECK
Battery Remaining Longevity: 35 mo
Battery Voltage: 2.96 V
Brady Statistic AP VP Percent: 0.01 %
Brady Statistic AP VS Percent: 5.05 %
Brady Statistic AS VP Percent: 0.04 %
Brady Statistic AS VS Percent: 94.9 %
Brady Statistic RA Percent Paced: 5.02 %
Brady Statistic RV Percent Paced: 0.05 %
Date Time Interrogation Session: 20250204074224
HighPow Impedance: 82 Ohm
Implantable Lead Connection Status: 753985
Implantable Lead Connection Status: 753985
Implantable Lead Implant Date: 20180102
Implantable Lead Implant Date: 20180102
Implantable Lead Location: 753859
Implantable Lead Location: 753860
Implantable Lead Model: 5076
Implantable Pulse Generator Implant Date: 20180102
Lead Channel Impedance Value: 323 Ohm
Lead Channel Impedance Value: 437 Ohm
Lead Channel Impedance Value: 456 Ohm
Lead Channel Pacing Threshold Amplitude: 0.5 V
Lead Channel Pacing Threshold Amplitude: 1.125 V
Lead Channel Pacing Threshold Pulse Width: 0.4 ms
Lead Channel Pacing Threshold Pulse Width: 0.4 ms
Lead Channel Sensing Intrinsic Amplitude: 28.875 mV
Lead Channel Sensing Intrinsic Amplitude: 28.875 mV
Lead Channel Sensing Intrinsic Amplitude: 3.5 mV
Lead Channel Sensing Intrinsic Amplitude: 3.5 mV
Lead Channel Setting Pacing Amplitude: 1.5 V
Lead Channel Setting Pacing Amplitude: 2.5 V
Lead Channel Setting Pacing Pulse Width: 0.4 ms
Lead Channel Setting Sensing Sensitivity: 0.3 mV
Zone Setting Status: 755011
Zone Setting Status: 755011

## 2023-11-28 DIAGNOSIS — J4540 Moderate persistent asthma, uncomplicated: Secondary | ICD-10-CM | POA: Diagnosis not present

## 2023-11-28 DIAGNOSIS — G4733 Obstructive sleep apnea (adult) (pediatric): Secondary | ICD-10-CM | POA: Diagnosis not present

## 2023-11-28 DIAGNOSIS — J301 Allergic rhinitis due to pollen: Secondary | ICD-10-CM | POA: Diagnosis not present

## 2023-12-03 DIAGNOSIS — L219 Seborrheic dermatitis, unspecified: Secondary | ICD-10-CM | POA: Diagnosis not present

## 2023-12-04 DIAGNOSIS — R791 Abnormal coagulation profile: Secondary | ICD-10-CM | POA: Diagnosis not present

## 2023-12-05 DIAGNOSIS — G4733 Obstructive sleep apnea (adult) (pediatric): Secondary | ICD-10-CM | POA: Diagnosis not present

## 2023-12-10 DIAGNOSIS — R791 Abnormal coagulation profile: Secondary | ICD-10-CM | POA: Diagnosis not present

## 2023-12-12 ENCOUNTER — Telehealth: Payer: Self-pay

## 2023-12-12 ENCOUNTER — Other Ambulatory Visit (HOSPITAL_COMMUNITY): Payer: Self-pay

## 2023-12-12 NOTE — Telephone Encounter (Signed)
Alert received from [employer] for Known PAF, all episodes on episode list since 12/10/23 at 21:26, longest 30 min 40 sec, most recent on 12/11/23 at 00:34 and ongoing, V rates > 110 bpm ~ 40-45%, Burden 3.3%, on Warfarin per Epic. Routed to clinic for ongoing AF.   Patient called reports palpitations 12/10/23 although today she is feeling better. Patient reports compliance with medication including no missed doses of OAC. Patient advised a scheduler will contact for apt and call if she has new symptoms arise. Pt. Voiced understanding and agreeable to plan.

## 2023-12-16 ENCOUNTER — Encounter: Payer: Self-pay | Admitting: Cardiology

## 2023-12-16 ENCOUNTER — Ambulatory Visit: Payer: 59 | Attending: Cardiology | Admitting: Cardiology

## 2023-12-16 VITALS — BP 102/64 | HR 77 | Ht 72.0 in | Wt 329.0 lb

## 2023-12-16 DIAGNOSIS — I4892 Unspecified atrial flutter: Secondary | ICD-10-CM

## 2023-12-16 DIAGNOSIS — I428 Other cardiomyopathies: Secondary | ICD-10-CM | POA: Diagnosis not present

## 2023-12-16 DIAGNOSIS — I420 Dilated cardiomyopathy: Secondary | ICD-10-CM | POA: Diagnosis not present

## 2023-12-16 DIAGNOSIS — I5022 Chronic systolic (congestive) heart failure: Secondary | ICD-10-CM | POA: Diagnosis not present

## 2023-12-16 DIAGNOSIS — Z79899 Other long term (current) drug therapy: Secondary | ICD-10-CM

## 2023-12-16 DIAGNOSIS — D6869 Other thrombophilia: Secondary | ICD-10-CM | POA: Diagnosis not present

## 2023-12-16 LAB — CUP PACEART INCLINIC DEVICE CHECK
Battery Remaining Longevity: 39 mo
Battery Voltage: 2.94 V
Brady Statistic AP VP Percent: 0.01 %
Brady Statistic AP VS Percent: 2.75 %
Brady Statistic AS VP Percent: 0.04 %
Brady Statistic AS VS Percent: 97.2 %
Brady Statistic RA Percent Paced: 2.75 %
Brady Statistic RV Percent Paced: 0.05 %
Date Time Interrogation Session: 20250224165432
HighPow Impedance: 78 Ohm
Implantable Lead Connection Status: 753985
Implantable Lead Connection Status: 753985
Implantable Lead Implant Date: 20180102
Implantable Lead Implant Date: 20180102
Implantable Lead Location: 753859
Implantable Lead Location: 753860
Implantable Lead Model: 5076
Implantable Pulse Generator Implant Date: 20180102
Lead Channel Impedance Value: 342 Ohm
Lead Channel Impedance Value: 399 Ohm
Lead Channel Impedance Value: 456 Ohm
Lead Channel Pacing Threshold Amplitude: 0.5 V
Lead Channel Pacing Threshold Amplitude: 1.25 V
Lead Channel Pacing Threshold Pulse Width: 0.4 ms
Lead Channel Pacing Threshold Pulse Width: 0.4 ms
Lead Channel Sensing Intrinsic Amplitude: 2.625 mV
Lead Channel Sensing Intrinsic Amplitude: 25 mV
Lead Channel Sensing Intrinsic Amplitude: 31.625 mV
Lead Channel Sensing Intrinsic Amplitude: 4.25 mV
Lead Channel Setting Pacing Amplitude: 1.5 V
Lead Channel Setting Pacing Amplitude: 2.5 V
Lead Channel Setting Pacing Pulse Width: 0.4 ms
Lead Channel Setting Sensing Sensitivity: 0.3 mV
Zone Setting Status: 755011
Zone Setting Status: 755011

## 2023-12-16 NOTE — Progress Notes (Signed)
 Electrophysiology Office Note:   Date:  12/16/2023  ID:  Shannon Baxter, DOB 06/23/1961, MRN [id number]  Primary Cardiologist: Gypsy Balsam, MD Primary Heart Failure: None Electrophysiologist: Haston Casebolt Jorja Loa, MD      History of Present Illness:   Shannon Baxter is a 63 y.o. female with h/o chronic systolic heart failure, mechanical aortic valve replacement, atrial flutter, atrial fibrillation, diabetes, obesity, hypertension, hyperlipidemia, ventricular fibrillation seen today for routine electrophysiology followup.   Since last being seen in our clinic the patient reports doing overall well.  She has noted continued fatigue and shortness of breath, though this is at her baseline.  She is also noted continued episodes of atrial fibrillation.  She does have palpitations a few times a week.  She had an episode last week of atrial fibrillation that lasted a few hours.  Despite this, her atrial fibrillation does not affect her daily activities.  She is happy with her current medications..  she denies chest pain, palpitations, dyspnea, PND, orthopnea, nausea, vomiting, dizziness, syncope, edema, weight gain, or early satiety.   Review of systems complete and found to be negative unless listed in HPI.      EP Information / Studies Reviewed:    EKG is ordered today. Personal review as below.  EKG Interpretation Date/Time:  Monday December 16 2023 16:24:54 EST Ventricular Rate:  77 PR Interval:  216 QRS Duration:  124 QT Interval:  420 QTC Calculation: 475 R Axis:   -6  Text Interpretation: Sinus rhythm with 1st degree A-V block with Premature atrial complexes Non-specific intra-ventricular conduction delay Borderline ECG When compared with ECG of 09-May-2023 10:24, Premature atrial complexes are now Present Confirmed by Sharran Caratachea ([id number]) on 12/16/2023 4:36:09 PM   ICD Interrogation-  reviewed in detail today,  See PACEART report.  Device History: Medtronic Dual Chamber ICD implanted  10/23/2016 for CHF History of appropriate therapy: No History of AAD therapy: No   Risk Assessment/Calculations:    CHA2DS2-VASc Score = 7   This indicates a 11.2% annual risk of stroke. The patient's score is based upon: CHF History: 1 HTN History: 1 Diabetes History: 1 Stroke History: 2 Vascular Disease History: 1 Age Score: 0 Gender Score: 1            Physical Exam:   VS:  BP 102/64   Pulse 77   Ht 6' (1.829 m)   Wt (!) 329 lb (149.2 kg)   SpO2 99%   BMI 44.62 kg/m    Wt Readings from Last 3 Encounters:  12/16/23 (!) 329 lb (149.2 kg)  11/18/23 (!) 336 lb (152.4 kg)  05/09/23 (!) 328 lb (148.8 kg)     GEN: Well nourished, well developed in no acute distress NECK: No JVD; No carotid bruits CARDIAC: Regular rate and rhythm, no murmurs, rubs, gallops RESPIRATORY:  Clear to auscultation without rales, wheezing or rhonchi  ABDOMEN: Soft, non-tender, non-distended EXTREMITIES:  No edema; No deformity   ASSESSMENT AND PLAN:    Chronic systolic dysfunction s/p Medtronic dual chamber ICD  euvolemic today Stable on an appropriate medical regimen Normal ICD function See Pace Art report No changes today  2.  Paroxysmal atrial fibrillation/atrial flutter: Post atrial flutter ablation 11/06/2018.  Continue to have episodes of atrial fibrillation with intermittently rapid rates.  I offered to increase her amiodarone.  As she is having rare instances, she is happy with her current management.  3.  Aortic valve replacement: Stable on most recent echo.  Plan per primary cardiology.  4.  Hypertension: Blood pressure is low today.  She has having some dizzy episodes.  She is on hydralazine 25 mg every 8 hours.  Darlyne Schmiesing have her hold hydralazine to see if dizziness improves.  5.  Obstructive sleep apnea: CPAP compliance encouraged  6.  Ventricular fibrillation arrest: Noted in 2020.  On amiodarone 100 mg daily.  No further episodes  7.  High risk medication monitoring: Currently  on amiodarone.  Ellery Meroney check TSH and LFTs today.  Disposition:   Follow up with Dr. Elberta Fortis in 12 months   Signed, Indigo Barbian Jorja Loa, MD

## 2023-12-17 ENCOUNTER — Other Ambulatory Visit (HOSPITAL_COMMUNITY): Payer: Self-pay

## 2023-12-18 ENCOUNTER — Other Ambulatory Visit: Payer: Self-pay | Admitting: Cardiology

## 2023-12-18 NOTE — Telephone Encounter (Signed)
 Rx refill sent to pharmacy.

## 2023-12-21 DIAGNOSIS — J4540 Moderate persistent asthma, uncomplicated: Secondary | ICD-10-CM | POA: Diagnosis not present

## 2023-12-23 ENCOUNTER — Encounter: Payer: Self-pay | Admitting: Podiatry

## 2023-12-23 ENCOUNTER — Ambulatory Visit (INDEPENDENT_AMBULATORY_CARE_PROVIDER_SITE_OTHER): Payer: 59 | Admitting: Podiatry

## 2023-12-23 DIAGNOSIS — M79674 Pain in right toe(s): Secondary | ICD-10-CM

## 2023-12-23 DIAGNOSIS — M79675 Pain in left toe(s): Secondary | ICD-10-CM

## 2023-12-23 DIAGNOSIS — L84 Corns and callosities: Secondary | ICD-10-CM

## 2023-12-23 DIAGNOSIS — B351 Tinea unguium: Secondary | ICD-10-CM | POA: Diagnosis not present

## 2023-12-23 DIAGNOSIS — E1151 Type 2 diabetes mellitus with diabetic peripheral angiopathy without gangrene: Secondary | ICD-10-CM

## 2023-12-23 NOTE — Progress Notes (Signed)
  Subjective:  Patient ID: Shannon Baxter, female    DOB: 25-Jun-1961,  MRN: [id number]  Chief Complaint  Patient presents with   [facility]    [facility] today with callous, last A1c was 6.0 in Feb. Takes plavix    63 y.o. female presents with the above complaint. History confirmed with patient. Patient presenting with pain related to dystrophic thickened elongated nails. Patient is unable to trim own nails related to nail dystrophy and mobility issues. Patient does  have a history of T2DM.  Last A1c 6.  Patient does have painful callus present plantar medial first MPJ right foot.  Objective:  Physical Exam: warm to cool, good capillary refill, pedal hair growth absent, pedal skin atrophic nail exam onychomycosis of the toenails, onycholysis, and dystrophic nails DP pulses faintly palpable, PT pulses faintly palpable, and protective sensation intact Left Foot:  Pain with palpation of nails due to elongation and dystrophic growth.  Right Foot: Pain with palpation of nails due to elongation and dystrophic growth.  Preulcerative callus present right plantar medial first MPJ  Assessment:   1. Pain due to onychomycosis of toenails of both feet   2. Type II diabetes mellitus with peripheral circulatory disorder (HCC)   3. Pre-ulcerative calluses      Plan:  Patient was evaluated and treated and all questions answered.  #Pre ulcerative calluses present right first MPJ plantar medial aspect All symptomatic hyperkeratoses x 1 separate lesions were safely debrided with a sterile #312 blade to patient's level of comfort without incident. We discussed preventative and palliative care of these lesions including supportive and accommodative shoegear, padding, prefabricated and custom molded accommodative orthoses, use of a pumice stone and lotions/creams daily.  #Onychomycosis with pain  -Nails palliatively debrided as below. -Educated on self-care  Procedure: Nail Debridement Rationale: Pain Type of  Debridement: manual, sharp debridement. Instrumentation: Nail nipper, rotary burr. Number of Nails: 10  Return in about 3 months (around 03/24/2024) for Diabetic Foot Care.         Bronwen Betters, DPM [facility] / [facility]

## 2023-12-25 DIAGNOSIS — R791 Abnormal coagulation profile: Secondary | ICD-10-CM | POA: Diagnosis not present

## 2024-01-01 DIAGNOSIS — Z7901 Long term (current) use of anticoagulants: Secondary | ICD-10-CM | POA: Diagnosis not present

## 2024-01-02 NOTE — Addendum Note (Signed)
 Addended by: Geralyn Flash D on: 01/02/2024 01:35 PM   Modules accepted: Orders

## 2024-01-02 NOTE — Progress Notes (Signed)
 Remote ICD transmission.

## 2024-01-08 DIAGNOSIS — R791 Abnormal coagulation profile: Secondary | ICD-10-CM | POA: Diagnosis not present

## 2024-01-09 DIAGNOSIS — I4891 Unspecified atrial fibrillation: Secondary | ICD-10-CM | POA: Diagnosis not present

## 2024-01-10 DIAGNOSIS — H1131 Conjunctival hemorrhage, right eye: Secondary | ICD-10-CM | POA: Diagnosis not present

## 2024-01-14 ENCOUNTER — Ambulatory Visit: Payer: 59

## 2024-01-14 DIAGNOSIS — E1151 Type 2 diabetes mellitus with diabetic peripheral angiopathy without gangrene: Secondary | ICD-10-CM

## 2024-01-14 DIAGNOSIS — I2510 Atherosclerotic heart disease of native coronary artery without angina pectoris: Secondary | ICD-10-CM

## 2024-01-14 DIAGNOSIS — I5020 Unspecified systolic (congestive) heart failure: Secondary | ICD-10-CM | POA: Diagnosis not present

## 2024-01-14 DIAGNOSIS — M21619 Bunion of unspecified foot: Secondary | ICD-10-CM

## 2024-01-14 DIAGNOSIS — L84 Corns and callosities: Secondary | ICD-10-CM

## 2024-01-14 DIAGNOSIS — J449 Chronic obstructive pulmonary disease, unspecified: Secondary | ICD-10-CM | POA: Diagnosis not present

## 2024-01-14 DIAGNOSIS — Z789 Other specified health status: Secondary | ICD-10-CM | POA: Diagnosis not present

## 2024-01-14 DIAGNOSIS — M2141 Flat foot [pes planus] (acquired), right foot: Secondary | ICD-10-CM

## 2024-01-14 DIAGNOSIS — Z7409 Other reduced mobility: Secondary | ICD-10-CM | POA: Diagnosis not present

## 2024-01-14 DIAGNOSIS — E1142 Type 2 diabetes mellitus with diabetic polyneuropathy: Secondary | ICD-10-CM | POA: Diagnosis not present

## 2024-01-14 NOTE — Progress Notes (Signed)
 Patient presents to the office today for diabetic shoe and insole measuring.  Patient was measured with brannock device to determine size and width for 1 pair of extra depth shoes and foam casted for 3 pair of insoles.   Documentation of medical necessity will be sent to patient's treating diabetic doctor to verify and sign.   Patient's diabetic provider: Foye Deer MD   Shoes and insoles will be ordered at that time and patient will be notified for an appointment for fitting when they arrive.   Shoe size (per patient): 11 Shoe choice:   A700W / A830W  Shoe size ordered: 11WD  Ppw and ABN signed

## 2024-01-15 DIAGNOSIS — R791 Abnormal coagulation profile: Secondary | ICD-10-CM | POA: Diagnosis not present

## 2024-01-20 DIAGNOSIS — U071 COVID-19: Secondary | ICD-10-CM | POA: Diagnosis not present

## 2024-01-20 DIAGNOSIS — R059 Cough, unspecified: Secondary | ICD-10-CM | POA: Diagnosis not present

## 2024-01-20 DIAGNOSIS — J309 Allergic rhinitis, unspecified: Secondary | ICD-10-CM | POA: Diagnosis not present

## 2024-01-21 DIAGNOSIS — J4540 Moderate persistent asthma, uncomplicated: Secondary | ICD-10-CM | POA: Diagnosis not present

## 2024-01-27 DIAGNOSIS — I4891 Unspecified atrial fibrillation: Secondary | ICD-10-CM | POA: Diagnosis not present

## 2024-01-29 DIAGNOSIS — R928 Other abnormal and inconclusive findings on diagnostic imaging of breast: Secondary | ICD-10-CM | POA: Diagnosis not present

## 2024-01-29 DIAGNOSIS — R791 Abnormal coagulation profile: Secondary | ICD-10-CM | POA: Diagnosis not present

## 2024-01-29 LAB — HM MAMMOGRAPHY

## 2024-02-03 DIAGNOSIS — R2681 Unsteadiness on feet: Secondary | ICD-10-CM | POA: Diagnosis not present

## 2024-02-03 DIAGNOSIS — Z789 Other specified health status: Secondary | ICD-10-CM | POA: Diagnosis not present

## 2024-02-03 DIAGNOSIS — M6281 Muscle weakness (generalized): Secondary | ICD-10-CM | POA: Diagnosis not present

## 2024-02-03 DIAGNOSIS — Z7409 Other reduced mobility: Secondary | ICD-10-CM | POA: Diagnosis not present

## 2024-02-06 DIAGNOSIS — R791 Abnormal coagulation profile: Secondary | ICD-10-CM | POA: Diagnosis not present

## 2024-02-10 DIAGNOSIS — M6281 Muscle weakness (generalized): Secondary | ICD-10-CM | POA: Diagnosis not present

## 2024-02-10 DIAGNOSIS — Z7409 Other reduced mobility: Secondary | ICD-10-CM | POA: Diagnosis not present

## 2024-02-10 DIAGNOSIS — R2681 Unsteadiness on feet: Secondary | ICD-10-CM | POA: Diagnosis not present

## 2024-02-10 DIAGNOSIS — Z789 Other specified health status: Secondary | ICD-10-CM | POA: Diagnosis not present

## 2024-02-12 DIAGNOSIS — B351 Tinea unguium: Secondary | ICD-10-CM | POA: Diagnosis not present

## 2024-02-12 DIAGNOSIS — M79674 Pain in right toe(s): Secondary | ICD-10-CM | POA: Diagnosis not present

## 2024-02-12 DIAGNOSIS — I4891 Unspecified atrial fibrillation: Secondary | ICD-10-CM | POA: Diagnosis not present

## 2024-02-12 DIAGNOSIS — E785 Hyperlipidemia, unspecified: Secondary | ICD-10-CM | POA: Diagnosis not present

## 2024-02-12 DIAGNOSIS — L84 Corns and callosities: Secondary | ICD-10-CM | POA: Diagnosis not present

## 2024-02-12 DIAGNOSIS — I110 Hypertensive heart disease with heart failure: Secondary | ICD-10-CM | POA: Diagnosis not present

## 2024-02-12 DIAGNOSIS — M79675 Pain in left toe(s): Secondary | ICD-10-CM | POA: Diagnosis not present

## 2024-02-12 DIAGNOSIS — E1151 Type 2 diabetes mellitus with diabetic peripheral angiopathy without gangrene: Secondary | ICD-10-CM | POA: Diagnosis not present

## 2024-02-12 DIAGNOSIS — E1142 Type 2 diabetes mellitus with diabetic polyneuropathy: Secondary | ICD-10-CM | POA: Diagnosis not present

## 2024-02-12 DIAGNOSIS — R791 Abnormal coagulation profile: Secondary | ICD-10-CM | POA: Diagnosis not present

## 2024-02-12 DIAGNOSIS — E039 Hypothyroidism, unspecified: Secondary | ICD-10-CM | POA: Diagnosis not present

## 2024-02-13 DIAGNOSIS — E1142 Type 2 diabetes mellitus with diabetic polyneuropathy: Secondary | ICD-10-CM | POA: Diagnosis not present

## 2024-02-13 DIAGNOSIS — I110 Hypertensive heart disease with heart failure: Secondary | ICD-10-CM | POA: Diagnosis not present

## 2024-02-13 DIAGNOSIS — E039 Hypothyroidism, unspecified: Secondary | ICD-10-CM | POA: Diagnosis not present

## 2024-02-13 DIAGNOSIS — E785 Hyperlipidemia, unspecified: Secondary | ICD-10-CM | POA: Diagnosis not present

## 2024-02-17 DIAGNOSIS — Z7409 Other reduced mobility: Secondary | ICD-10-CM | POA: Diagnosis not present

## 2024-02-17 DIAGNOSIS — R2681 Unsteadiness on feet: Secondary | ICD-10-CM | POA: Diagnosis not present

## 2024-02-17 DIAGNOSIS — M6281 Muscle weakness (generalized): Secondary | ICD-10-CM | POA: Diagnosis not present

## 2024-02-17 DIAGNOSIS — Z789 Other specified health status: Secondary | ICD-10-CM | POA: Diagnosis not present

## 2024-02-19 DIAGNOSIS — R791 Abnormal coagulation profile: Secondary | ICD-10-CM | POA: Diagnosis not present

## 2024-02-20 DIAGNOSIS — R2689 Other abnormalities of gait and mobility: Secondary | ICD-10-CM | POA: Diagnosis not present

## 2024-02-20 DIAGNOSIS — I4891 Unspecified atrial fibrillation: Secondary | ICD-10-CM | POA: Diagnosis not present

## 2024-02-20 DIAGNOSIS — Z7409 Other reduced mobility: Secondary | ICD-10-CM | POA: Diagnosis not present

## 2024-02-20 DIAGNOSIS — Z789 Other specified health status: Secondary | ICD-10-CM | POA: Diagnosis not present

## 2024-02-20 DIAGNOSIS — J4540 Moderate persistent asthma, uncomplicated: Secondary | ICD-10-CM | POA: Diagnosis not present

## 2024-02-20 DIAGNOSIS — M6281 Muscle weakness (generalized): Secondary | ICD-10-CM | POA: Diagnosis not present

## 2024-02-20 DIAGNOSIS — R2681 Unsteadiness on feet: Secondary | ICD-10-CM | POA: Diagnosis not present

## 2024-02-24 DIAGNOSIS — R2689 Other abnormalities of gait and mobility: Secondary | ICD-10-CM | POA: Diagnosis not present

## 2024-02-24 DIAGNOSIS — M6281 Muscle weakness (generalized): Secondary | ICD-10-CM | POA: Diagnosis not present

## 2024-02-24 DIAGNOSIS — Z7409 Other reduced mobility: Secondary | ICD-10-CM | POA: Diagnosis not present

## 2024-02-24 DIAGNOSIS — Z789 Other specified health status: Secondary | ICD-10-CM | POA: Diagnosis not present

## 2024-02-24 DIAGNOSIS — R2681 Unsteadiness on feet: Secondary | ICD-10-CM | POA: Diagnosis not present

## 2024-02-25 ENCOUNTER — Ambulatory Visit (INDEPENDENT_AMBULATORY_CARE_PROVIDER_SITE_OTHER): Payer: Self-pay

## 2024-02-25 DIAGNOSIS — I420 Dilated cardiomyopathy: Secondary | ICD-10-CM

## 2024-02-25 LAB — CUP PACEART REMOTE DEVICE CHECK
Battery Remaining Longevity: 35 mo
Battery Voltage: 2.95 V
Brady Statistic AP VP Percent: 0.02 %
Brady Statistic AP VS Percent: 6.99 %
Brady Statistic AS VP Percent: 0.09 %
Brady Statistic AS VS Percent: 92.9 %
Brady Statistic RA Percent Paced: 6.82 %
Brady Statistic RV Percent Paced: 0.1 %
Date Time Interrogation Session: 20250506044223
HighPow Impedance: 77 Ohm
Implantable Lead Connection Status: 753985
Implantable Lead Connection Status: 753985
Implantable Lead Implant Date: 20180102
Implantable Lead Implant Date: 20180102
Implantable Lead Location: 753859
Implantable Lead Location: 753860
Implantable Lead Model: 5076
Implantable Pulse Generator Implant Date: 20180102
Lead Channel Impedance Value: 323 Ohm
Lead Channel Impedance Value: 399 Ohm
Lead Channel Impedance Value: 494 Ohm
Lead Channel Pacing Threshold Amplitude: 0.375 V
Lead Channel Pacing Threshold Amplitude: 1.125 V
Lead Channel Pacing Threshold Pulse Width: 0.4 ms
Lead Channel Pacing Threshold Pulse Width: 0.4 ms
Lead Channel Sensing Intrinsic Amplitude: 2.25 mV
Lead Channel Sensing Intrinsic Amplitude: 2.25 mV
Lead Channel Sensing Intrinsic Amplitude: 25 mV
Lead Channel Sensing Intrinsic Amplitude: 25 mV
Lead Channel Setting Pacing Amplitude: 1.5 V
Lead Channel Setting Pacing Amplitude: 2.5 V
Lead Channel Setting Pacing Pulse Width: 0.4 ms
Lead Channel Setting Sensing Sensitivity: 0.3 mV
Zone Setting Status: 755011
Zone Setting Status: 755011

## 2024-02-26 DIAGNOSIS — R791 Abnormal coagulation profile: Secondary | ICD-10-CM | POA: Diagnosis not present

## 2024-02-28 ENCOUNTER — Telehealth: Payer: Self-pay

## 2024-02-28 NOTE — Telephone Encounter (Signed)
 Put shoes in box to be sent over to [facility] on 02/28/24

## 2024-03-03 DIAGNOSIS — G4733 Obstructive sleep apnea (adult) (pediatric): Secondary | ICD-10-CM | POA: Diagnosis not present

## 2024-03-04 DIAGNOSIS — R791 Abnormal coagulation profile: Secondary | ICD-10-CM | POA: Diagnosis not present

## 2024-03-10 DIAGNOSIS — Z7409 Other reduced mobility: Secondary | ICD-10-CM | POA: Diagnosis not present

## 2024-03-10 DIAGNOSIS — R2689 Other abnormalities of gait and mobility: Secondary | ICD-10-CM | POA: Diagnosis not present

## 2024-03-10 DIAGNOSIS — Z789 Other specified health status: Secondary | ICD-10-CM | POA: Diagnosis not present

## 2024-03-10 DIAGNOSIS — R2681 Unsteadiness on feet: Secondary | ICD-10-CM | POA: Diagnosis not present

## 2024-03-10 DIAGNOSIS — M6281 Muscle weakness (generalized): Secondary | ICD-10-CM | POA: Diagnosis not present

## 2024-03-12 ENCOUNTER — Telehealth: Payer: Self-pay | Admitting: Family Medicine

## 2024-03-12 DIAGNOSIS — Z7409 Other reduced mobility: Secondary | ICD-10-CM | POA: Diagnosis not present

## 2024-03-12 DIAGNOSIS — M6281 Muscle weakness (generalized): Secondary | ICD-10-CM | POA: Diagnosis not present

## 2024-03-12 DIAGNOSIS — R791 Abnormal coagulation profile: Secondary | ICD-10-CM | POA: Diagnosis not present

## 2024-03-12 DIAGNOSIS — Z789 Other specified health status: Secondary | ICD-10-CM | POA: Diagnosis not present

## 2024-03-12 DIAGNOSIS — R2689 Other abnormalities of gait and mobility: Secondary | ICD-10-CM | POA: Diagnosis not present

## 2024-03-12 DIAGNOSIS — R2681 Unsteadiness on feet: Secondary | ICD-10-CM | POA: Diagnosis not present

## 2024-03-12 MED ORDER — EMGALITY 120 MG/ML ~~LOC~~ SOAJ: 120.0000 mg | SUBCUTANEOUS | 2 refills | Status: DC

## 2024-03-12 NOTE — Telephone Encounter (Signed)
 Last seen on 03/05/24 Follow up scheduled on 06/15/24

## 2024-03-12 NOTE — Telephone Encounter (Signed)
 Pt has scheduled her 1 yr f/u, is on wait list.  Pt now requesting a refill on her  Galcanezumab -gnlm (EMGALITY ) 120 MG/ML SOAJ [facility] [phone]

## 2024-03-19 DIAGNOSIS — R2681 Unsteadiness on feet: Secondary | ICD-10-CM | POA: Diagnosis not present

## 2024-03-19 DIAGNOSIS — M6281 Muscle weakness (generalized): Secondary | ICD-10-CM | POA: Diagnosis not present

## 2024-03-19 DIAGNOSIS — J4540 Moderate persistent asthma, uncomplicated: Secondary | ICD-10-CM | POA: Diagnosis not present

## 2024-03-19 DIAGNOSIS — Z7409 Other reduced mobility: Secondary | ICD-10-CM | POA: Diagnosis not present

## 2024-03-19 DIAGNOSIS — R2689 Other abnormalities of gait and mobility: Secondary | ICD-10-CM | POA: Diagnosis not present

## 2024-03-19 DIAGNOSIS — G4733 Obstructive sleep apnea (adult) (pediatric): Secondary | ICD-10-CM | POA: Diagnosis not present

## 2024-03-19 DIAGNOSIS — J301 Allergic rhinitis due to pollen: Secondary | ICD-10-CM | POA: Diagnosis not present

## 2024-03-19 DIAGNOSIS — Z789 Other specified health status: Secondary | ICD-10-CM | POA: Diagnosis not present

## 2024-03-19 DIAGNOSIS — R791 Abnormal coagulation profile: Secondary | ICD-10-CM | POA: Diagnosis not present

## 2024-03-22 DIAGNOSIS — J4540 Moderate persistent asthma, uncomplicated: Secondary | ICD-10-CM | POA: Diagnosis not present

## 2024-03-25 DIAGNOSIS — R791 Abnormal coagulation profile: Secondary | ICD-10-CM | POA: Diagnosis not present

## 2024-03-26 DIAGNOSIS — Z789 Other specified health status: Secondary | ICD-10-CM | POA: Diagnosis not present

## 2024-03-26 DIAGNOSIS — R2681 Unsteadiness on feet: Secondary | ICD-10-CM | POA: Diagnosis not present

## 2024-03-26 DIAGNOSIS — M6281 Muscle weakness (generalized): Secondary | ICD-10-CM | POA: Diagnosis not present

## 2024-03-26 DIAGNOSIS — R2689 Other abnormalities of gait and mobility: Secondary | ICD-10-CM | POA: Diagnosis not present

## 2024-03-26 DIAGNOSIS — Z7409 Other reduced mobility: Secondary | ICD-10-CM | POA: Diagnosis not present

## 2024-03-27 ENCOUNTER — Other Ambulatory Visit: Payer: Self-pay | Admitting: Cardiology

## 2024-03-30 ENCOUNTER — Encounter: Payer: Self-pay | Admitting: Podiatry

## 2024-03-30 ENCOUNTER — Ambulatory Visit (INDEPENDENT_AMBULATORY_CARE_PROVIDER_SITE_OTHER): Admitting: Podiatry

## 2024-03-30 DIAGNOSIS — B351 Tinea unguium: Secondary | ICD-10-CM

## 2024-03-30 DIAGNOSIS — L84 Corns and callosities: Secondary | ICD-10-CM

## 2024-03-30 DIAGNOSIS — M76821 Posterior tibial tendinitis, right leg: Secondary | ICD-10-CM

## 2024-03-30 DIAGNOSIS — M2142 Flat foot [pes planus] (acquired), left foot: Secondary | ICD-10-CM | POA: Diagnosis not present

## 2024-03-30 DIAGNOSIS — M79674 Pain in right toe(s): Secondary | ICD-10-CM

## 2024-03-30 DIAGNOSIS — E1151 Type 2 diabetes mellitus with diabetic peripheral angiopathy without gangrene: Secondary | ICD-10-CM | POA: Diagnosis not present

## 2024-03-30 DIAGNOSIS — M21619 Bunion of unspecified foot: Secondary | ICD-10-CM

## 2024-03-30 DIAGNOSIS — M2141 Flat foot [pes planus] (acquired), right foot: Secondary | ICD-10-CM

## 2024-03-30 DIAGNOSIS — M79675 Pain in left toe(s): Secondary | ICD-10-CM

## 2024-03-30 NOTE — Patient Instructions (Signed)
 Posterior Tibial Tendinitis Posterior tibial tendinitis is irritation of a tendon called the posterior tibial tendon. Your posterior tibial tendon is a cord-like tissue that connects calf muscles to your foot. They work together to: Human resources officer. Help you rise up on your toes. Help you turn your foot down and in (inversion). This condition causes foot and ankle pain. It can also lead to a flat foot. What are the causes? This condition is most often caused by repeated stress to the tendon (overuse injury). It can also be caused by a sudden injury that stresses the tendon, such as landing on your foot after jumping or falling. What increases the risk? This condition is more likely to develop in: People who play a sport that involves putting a lot of pressure on the feet, such as basketball, tennis, soccer, and hockey. Runners. Females who are older than 62 years of age and are overweight. People with diabetes. People with decreased foot stability. People with flat feet. What are the signs or symptoms? Symptoms include: Pain in the inner ankle. Pain at the arch of your foot. Pain that gets worse with running, walking, or standing. Swelling on the inside of your ankle and foot. Weakness in your ankle or foot. Inability to stand up on tiptoe. Flattening of the arch of your foot. How is this diagnosed? This condition may be diagnosed based on: Your symptoms. Your medical history. A physical exam. Tests, such as X-ray, MRI, or ultrasound. How is this treated? This condition may be treated by: Putting ice on the injured area. Taking NSAIDs, such as ibuprofen, to reduce pain and swelling. Wearing a special shoe or shoe insert to support your arch (orthotic). Having physical therapy. Replacing high-impact exercise with low-impact exercise, such as swimming or cycling. If your symptoms do not improve with these treatments, you may need to wear a splint, removable walking boot, or  short leg cast for 6-8 weeks to keep your foot and ankle still (immobilized). Follow these instructions at home: If you have a nonremovable cast or splint: Do not put pressure on any part of the cast or splint until it is fully hardened. This may take several hours. Do not stick anything inside the cast or splint to scratch your skin. Doing that increases your risk of infection. Check the skin around the cast or splint every day. Tell your health care provider about any concerns. You may put lotion on dry skin around the edges of the cast or splint. Do not put lotion on the skin underneath it. Keep the cast or splint clean and dry. If you have a removable boot: Wear the boot as told by your provider. Remove it only as told by your provider. Check the skin around the boot every day. Tell your provider about any concerns. Loosen the boot if your toes tingle, become numb, or turn cold and blue. Keep the boot clean and dry. Bathing Do not take baths, swim, or use a hot tub until your provider approves. Ask your provider if you may take showers. If your cast, splint, or boot is not waterproof: Do not let it get wet. Cover it with a waterproof covering while you take a bath or a shower. Managing pain and swelling  If told, put ice on the injured area. If you have a removable boot, remove it as told by your provider. Put ice in a plastic bag. Place a towel between your skin and the bag or between your cast and the bag.  Leave the ice on for 20 minutes, 2-3 times a day. If your skin turns bright red, remove the ice right away to prevent skin damage. The risk of damage is higher if you cannot feel pain, heat, or cold. Move your toes often to reduce stiffness and swelling. Raise (elevate) the injured area above the level of your heart while you are sitting or lying down. Activity Do not use the injured foot to support your body weight until your provider says that you can. Use crutches as told by  your provider. Do not do activities that make pain or swelling worse. Ask your provider when it is safe to drive if you have a cast, splint, or boot on your foot. Do exercises as told by your provider. Return to your normal activities as told by your provider. Ask your provider what activities are safe for you. General instructions Take over-the-counter and prescription medicines only as told by your provider. If you have an orthotic, use it as told by your provider. How is this prevented? Wear footwear that is appropriate for your athletic activity. Avoid athletic activities that cause pain or swelling in your ankle or foot. Warm up and stretch before being active. Stop activity if you develop pain or swelling while training. See your provider if you have pain or swelling that does not improve after a few days of rest. Start a new athletic activity slowly so you can build up your strength and flexibility. Contact a health care provider if: Your symptoms get worse. Your symptoms do not improve in 6-8 weeks. You develop new, unexplained symptoms. Your splint, boot, or cast gets damaged. This information is not intended to replace advice given to you by your health care provider. Make sure you discuss any questions you have with your health care provider. Document Revised: 10/23/2022 Document Reviewed: 10/23/2022 Elsevier Patient Education  2024 [employer]  Posterior Tibial Tendinitis Rehab Ask your health care provider which exercises are safe for you. Do exercises exactly as told by your provider and adjust them as directed. It's normal to feel mild stretching, pulling, tightness, or discomfort as you do these exercises. Stop right away if you feel sudden pain or your pain gets worse. Do not begin these exercises until told by your provider. Stretching and range-of-motion exercises These exercises warm up your muscles and joints and improve the movement and flexibility in your ankle  and foot. These exercises may also help to relieve pain. Standing wall calf stretch, knee straight  Stand with your hands against a wall. Extend your left / right leg behind you, and bend your front knee slightly. If told, place a folded washcloth under the arch of your foot for support. Point the toes of your back foot slightly inward. Keeping your heels on the floor and your back knee straight, shift your weight toward the wall. Do not allow your back to arch. You should feel a gentle stretch in your upper left / right calf. Hold this position for __________ seconds. Repeat __________ times. Complete this exercise __________ times a day. Standing wall calf stretch, knee bent  Stand with your hands against a wall. Extend your left / right leg behind you, and bend your front knee slightly. If told, place a folded washcloth under the arch of your foot for support. Point the toes of your back foot slightly inward. Unlock your back knee so it's bent. Keep your heels on the floor. You should feel a gentle stretch deep in your  lower left / right calf. Hold this position for __________ seconds. Repeat __________ times. Complete this exercise __________ times a day. Strengthening exercises These exercises build strength and endurance in your ankle and foot. Endurance is the ability to use your muscles for a long time, even after they get tired. Ankle inversion with band  Secure one end of a rubber exercise band or tubing to a fixed object, such as a table leg or a pole, that will stay still when the band is pulled. Loop the other end of the band around the middle of your left / right foot. Sit on the floor facing the object with your left / right leg extended. The band or tube should be slightly tense when your foot is relaxed. Leading with your big toe, slowly bring your left / right foot and ankle inward, toward your other foot (inversion). Hold this position for __________ seconds. Slowly  return your foot to the starting position. Repeat __________ times. Complete this exercise __________ times a day. Towel curls  Sit in a chair on a non-carpeted surface, and put your feet on the floor. Place a towel in front of your feet. If told by your provider, add a __________ weight to the end of the towel. Keeping your heel on the floor, put your left / right foot on the towel. Pull the towel toward you by grabbing the towel with your toes and curling them under. Keep your heel on the floor while you do this. Let your toes relax. Grab the towel with your toes again. Keep going until the towel is completely underneath your foot. Repeat __________ times. Complete this exercise __________ times a day. Balance exercise This exercise improves or maintains your balance. Balance is important in preventing falls. Single leg stand  Without wearing shoes, stand near a railing or in a doorway. You may hold on to the railing or doorframe as needed for balance. Stand on your left / right foot. Keep your big toe down on the floor and try to keep your arch lifted. If balancing in this position is too easy, try the exercise with your eyes closed or while standing on a pillow. Hold this position for __________ seconds. Repeat __________ times. Complete this exercise __________ times a day. This information is not intended to replace advice given to you by your health care provider. Make sure you discuss any questions you have with your health care provider. Document Revised: 10/10/2022 Document Reviewed: 10/10/2022 Elsevier Patient Education  2024 [employer].

## 2024-03-30 NOTE — Progress Notes (Unsigned)
  Subjective:  Patient ID: Shannon Baxter, female    DOB: 1960-12-12,  MRN: [id number]  Chief Complaint  Patient presents with   [facility]     [facility] with out callous. Last A1c was 6.3 in April and takes Plavix and Warfarin.    63 y.o. female presents with the above complaint. History confirmed with patient. Patient presenting with pain related to dystrophic thickened elongated nails. Patient is unable to trim own nails related to nail dystrophy and mobility issues. Patient does  have a history of T2DM.  Last A1c 6.3.  Getting her diabetic shoes today.  Denies painful callus today. Does complain of some right foot arch pain today.  Objective:  Physical Exam: warm to cool, good capillary refill, pedal hair growth absent, pedal skin atrophic nail exam onychomycosis of the toenails, onycholysis, and dystrophic nails DP pulses faintly palpable, PT pulses faintly palpable, and protective sensation intact Left Foot:  Pain with palpation of nails due to elongation and dystrophic growth.  Pes planus foot type Right Foot: Pain with palpation of nails due to elongation and dystrophic growth.  Pes planus foot type.  Pain on palpation of PT tendon on navicular insertion.  Gait: Ambulates with assist of walker  Assessment:   1. Type II diabetes mellitus with peripheral circulatory disorder (HCC)   2. Pain due to onychomycosis of toenails of both feet   3. Posterior tibial tendon dysfunction (PTTD) of right lower extremity      Plan:  Patient was evaluated and treated and all questions answered.  # PTTD right foot with pes planus - Did discuss PT tendon dysfunction accompanying with foot biomechanics and flatfoot type. - Discussed stretching, icing, use of good supportive shoes, use of good inserts.  Patient is getting her diabetic shoes today.  Will see if this provides some benefit. - Cannot take long-term p.o. NSAIDs - Stretching exercises discussed with patient at length with written instructions  dispensed.  Will have her follow-up in a couple weeks if this does not improve with new shoes. -I certify that this diagnosis represents a distinct and separate diagnosis that requires evaluation and treatment separate from other procedures or diagnosis   #Onychomycosis with pain  -Nails palliatively debrided as below. -Educated on self-care  Procedure: Nail Debridement Rationale: Pain Type of Debridement: manual, sharp debridement. Instrumentation: Nail nipper, rotary burr. Number of Nails: 10  Return in about 3 months (around 06/30/2024) for Diabetic Foot Care.         Eve Hinders, DPM [facility] / [facility]

## 2024-03-31 DIAGNOSIS — I4891 Unspecified atrial fibrillation: Secondary | ICD-10-CM | POA: Diagnosis not present

## 2024-04-01 DIAGNOSIS — R791 Abnormal coagulation profile: Secondary | ICD-10-CM | POA: Diagnosis not present

## 2024-04-07 DIAGNOSIS — R2681 Unsteadiness on feet: Secondary | ICD-10-CM | POA: Diagnosis not present

## 2024-04-07 DIAGNOSIS — M6281 Muscle weakness (generalized): Secondary | ICD-10-CM | POA: Diagnosis not present

## 2024-04-07 DIAGNOSIS — Z789 Other specified health status: Secondary | ICD-10-CM | POA: Diagnosis not present

## 2024-04-07 DIAGNOSIS — R2689 Other abnormalities of gait and mobility: Secondary | ICD-10-CM | POA: Diagnosis not present

## 2024-04-07 DIAGNOSIS — Z7409 Other reduced mobility: Secondary | ICD-10-CM | POA: Diagnosis not present

## 2024-04-08 DIAGNOSIS — R791 Abnormal coagulation profile: Secondary | ICD-10-CM | POA: Diagnosis not present

## 2024-04-08 NOTE — Progress Notes (Signed)
 Remote ICD transmission.

## 2024-04-15 DIAGNOSIS — R791 Abnormal coagulation profile: Secondary | ICD-10-CM | POA: Diagnosis not present

## 2024-04-17 DIAGNOSIS — R791 Abnormal coagulation profile: Secondary | ICD-10-CM | POA: Diagnosis not present

## 2024-04-21 DIAGNOSIS — J4540 Moderate persistent asthma, uncomplicated: Secondary | ICD-10-CM | POA: Diagnosis not present

## 2024-04-22 DIAGNOSIS — R791 Abnormal coagulation profile: Secondary | ICD-10-CM | POA: Diagnosis not present

## 2024-04-27 DIAGNOSIS — R2681 Unsteadiness on feet: Secondary | ICD-10-CM | POA: Diagnosis not present

## 2024-04-27 DIAGNOSIS — Z789 Other specified health status: Secondary | ICD-10-CM | POA: Diagnosis not present

## 2024-04-27 DIAGNOSIS — Z7409 Other reduced mobility: Secondary | ICD-10-CM | POA: Diagnosis not present

## 2024-04-27 DIAGNOSIS — M6281 Muscle weakness (generalized): Secondary | ICD-10-CM | POA: Diagnosis not present

## 2024-04-28 DIAGNOSIS — Z789 Other specified health status: Secondary | ICD-10-CM | POA: Diagnosis not present

## 2024-04-28 DIAGNOSIS — R2681 Unsteadiness on feet: Secondary | ICD-10-CM | POA: Diagnosis not present

## 2024-04-28 DIAGNOSIS — E785 Hyperlipidemia, unspecified: Secondary | ICD-10-CM | POA: Diagnosis not present

## 2024-04-28 DIAGNOSIS — R791 Abnormal coagulation profile: Secondary | ICD-10-CM | POA: Diagnosis not present

## 2024-04-28 DIAGNOSIS — E039 Hypothyroidism, unspecified: Secondary | ICD-10-CM | POA: Diagnosis not present

## 2024-04-28 DIAGNOSIS — J4520 Mild intermittent asthma, uncomplicated: Secondary | ICD-10-CM | POA: Diagnosis not present

## 2024-04-28 DIAGNOSIS — M6281 Muscle weakness (generalized): Secondary | ICD-10-CM | POA: Diagnosis not present

## 2024-04-28 DIAGNOSIS — Z7409 Other reduced mobility: Secondary | ICD-10-CM | POA: Diagnosis not present

## 2024-04-28 DIAGNOSIS — E1140 Type 2 diabetes mellitus with diabetic neuropathy, unspecified: Secondary | ICD-10-CM | POA: Diagnosis not present

## 2024-05-06 DIAGNOSIS — R059 Cough, unspecified: Secondary | ICD-10-CM | POA: Diagnosis not present

## 2024-05-06 DIAGNOSIS — R509 Fever, unspecified: Secondary | ICD-10-CM | POA: Diagnosis not present

## 2024-05-06 DIAGNOSIS — J019 Acute sinusitis, unspecified: Secondary | ICD-10-CM | POA: Diagnosis not present

## 2024-05-06 DIAGNOSIS — R791 Abnormal coagulation profile: Secondary | ICD-10-CM | POA: Diagnosis not present

## 2024-05-06 DIAGNOSIS — B9689 Other specified bacterial agents as the cause of diseases classified elsewhere: Secondary | ICD-10-CM | POA: Diagnosis not present

## 2024-05-07 DIAGNOSIS — E1140 Type 2 diabetes mellitus with diabetic neuropathy, unspecified: Secondary | ICD-10-CM | POA: Diagnosis not present

## 2024-05-13 ENCOUNTER — Other Ambulatory Visit: Payer: Self-pay | Admitting: *Deleted

## 2024-05-13 DIAGNOSIS — R791 Abnormal coagulation profile: Secondary | ICD-10-CM | POA: Diagnosis not present

## 2024-05-13 MED ORDER — UBRELVY 50 MG PO TABS: 50.0000 mg | ORAL_TABLET | ORAL | 1 refills | Status: DC | PRN

## 2024-05-13 NOTE — Telephone Encounter (Signed)
 Last seen on 03/06/23 Follow up scheduled on 06/15/24

## 2024-05-20 DIAGNOSIS — R791 Abnormal coagulation profile: Secondary | ICD-10-CM | POA: Diagnosis not present

## 2024-05-22 DIAGNOSIS — J4540 Moderate persistent asthma, uncomplicated: Secondary | ICD-10-CM | POA: Diagnosis not present

## 2024-05-25 ENCOUNTER — Encounter: Payer: Self-pay | Admitting: Cardiology

## 2024-05-25 ENCOUNTER — Ambulatory Visit: Attending: Cardiology | Admitting: Cardiology

## 2024-05-25 VITALS — BP 130/90 | HR 75 | Ht 72.0 in | Wt 320.0 lb

## 2024-05-25 DIAGNOSIS — R0609 Other forms of dyspnea: Secondary | ICD-10-CM

## 2024-05-25 DIAGNOSIS — I420 Dilated cardiomyopathy: Secondary | ICD-10-CM | POA: Diagnosis not present

## 2024-05-25 DIAGNOSIS — I2510 Atherosclerotic heart disease of native coronary artery without angina pectoris: Secondary | ICD-10-CM | POA: Diagnosis not present

## 2024-05-25 DIAGNOSIS — R2689 Other abnormalities of gait and mobility: Secondary | ICD-10-CM | POA: Diagnosis not present

## 2024-05-25 DIAGNOSIS — I480 Paroxysmal atrial fibrillation: Secondary | ICD-10-CM | POA: Diagnosis not present

## 2024-05-25 DIAGNOSIS — J4520 Mild intermittent asthma, uncomplicated: Secondary | ICD-10-CM | POA: Diagnosis not present

## 2024-05-25 DIAGNOSIS — E785 Hyperlipidemia, unspecified: Secondary | ICD-10-CM | POA: Diagnosis not present

## 2024-05-25 DIAGNOSIS — E1140 Type 2 diabetes mellitus with diabetic neuropathy, unspecified: Secondary | ICD-10-CM | POA: Diagnosis not present

## 2024-05-25 DIAGNOSIS — I4892 Unspecified atrial flutter: Secondary | ICD-10-CM

## 2024-05-25 DIAGNOSIS — I10 Essential (primary) hypertension: Secondary | ICD-10-CM

## 2024-05-25 NOTE — Patient Instructions (Signed)
 Medication Instructions:  Your physician recommends that you continue on your current medications as directed. Please refer to the Current Medication list given to you today.  *If you need a refill on your cardiac medications before your next appointment, please call your pharmacy*   Lab Work: ProBNP, CMP, CBC, Lipid- today If you have labs (blood work) drawn today and your tests are completely normal, you will receive your results only by: MyChart Message (if you have MyChart) OR A paper copy in the mail If you have any lab test that is abnormal or we need to change your treatment, we will call you to review the results.   Testing/Procedures: Your physician has requested that you have an echocardiogram. Echocardiography is a painless test that uses sound waves to create images of your heart. It provides your doctor with information about the size and shape of your heart and how well your heart's chambers and valves are working. This procedure takes approximately one hour. There are no restrictions for this procedure. Please do NOT wear cologne, perfume, aftershave, or lotions (deodorant is allowed). Please arrive 15 minutes prior to your appointment time.  Please note: We ask at that you not bring children with you during ultrasound (echo/ vascular) testing. Due to room size and safety concerns, children are not allowed in the ultrasound rooms during exams. Our front office staff cannot provide observation of children in our lobby area while testing is being conducted. An adult accompanying a patient to their appointment will only be allowed in the ultrasound room at the discretion of the ultrasound technician under special circumstances. We apologize for any inconvenience.    Follow-Up: At [facility], you and your health needs are our priority.  As part of our continuing mission to provide you with exceptional heart care, we have created designated Provider Care Teams.  These Care Teams  include your primary Cardiologist (physician) and Advanced Practice Providers (APPs -  Physician Assistants and Nurse Practitioners) who all work together to provide you with the care you need, when you need it.  We recommend signing up for the patient portal called MyChart.  Sign up information is provided on this After Visit Summary.  MyChart is used to connect with patients for Virtual Visits (Telemedicine).  Patients are able to view lab/test results, encounter notes, upcoming appointments, etc.  Non-urgent messages can be sent to your provider as well.   To learn more about what you can do with MyChart, go to [email or web address].    Your next appointment:   3 month(s)  The format for your next appointment:   In Person  Provider:   Lamar Fitch, MD    Other Instructions NA

## 2024-05-25 NOTE — Progress Notes (Unsigned)
 Cardiology Office Note:    Date:  05/25/2024   ID:  Shannon Baxter, DOB 1961/01/20, MRN [id number]  PCP:  Keren Vicenta BRAVO, MD  Cardiologist:  Lamar Fitch, MD    Referring MD: Keren Vicenta BRAVO, MD   Chief Complaint  Patient presents with   Follow-up    History of Present Illness:    Shannon Baxter is a 63 y.o. female past medical history significant for aortic valve replacement with 23 mm Saint Jude prosthesis done in May 2017, cardiomyopathy with ejection fraction 35%, paroxysmal atrial fibrillation, obesity, BiV pacer.  Comes today 2 months of follow-up she seems to having more shortness of breath, denies have any chest pain tightness squeezing pressure in her chest no palpitations.  Maintaining sinus rhythm.  No swelling of lower extremities difficult to judge about paroxysmal nocturnal dyspnea  Past Medical History:  Diagnosis Date   Acute on chronic systolic heart failure (HCC) 02/21/2016   Allergy    Aortic insufficiency    Aortic stenosis    Arthritis    Asthma    Atherosclerosis of native arteries of extremities with intermittent claudication, bilateral legs (HCC) 08/04/2015   Carotid atherosclerosis 07/26/2015   CHF (congestive heart failure) (HCC)    Chronic systolic congestive heart failure (HCC) 12/06/2016   Claudication (HCC)    Congestive heart failure (CHF) (HCC)    Coronary artery disease    Coronary artery disease involving native coronary artery of native heart without angina pectoris 07/26/2015   Diabetes mellitus without complication (HCC)    Dilated cardiomyopathy (HCC) 09/03/2016   Overview:  Ejection fraction 20-25%   Dyslipidemia 07/26/2015   Dyspnea on exertion 10/26/2015   Essential hypertension 07/26/2015   GERD (gastroesophageal reflux disease)    Heart murmur    History of mechanical aortic valve replacement 03/29/2016   Overview:  May 2017. St Judes 23 mm  Formatting of this note might be different from the original. Overview:  May 2017. St  Judes 23 mm Formatting of this note might be different from the original. May 2017. St Judes 23 mm   History of tobacco abuse 07/26/2015   Hyperlipidemia    Hypertension    Hypothyroidism 01/23/2016   Intermittent claudication (HCC) 07/26/2015   Morbid obesity (HCC) 12/06/2016   Nutritional and metabolic cardiomyopathy (HCC)    Paroxysmal atrial fibrillation (HCC) 03/06/2016   Paroxysmal atrial flutter (HCC) 09/01/2018   Peripheral vascular disease (HCC) 07/26/2015   Presence of automatic implantable cardioverter-defibrillator 10/23/2016   PVD (peripheral vascular disease) (HCC)    Rheumatic aortic valve insufficiency 03/05/2016   Sleep apnea    Stroke (HCC)    Type 2 diabetes mellitus with diabetic peripheral angiopathy without gangrene, without long-term current use of insulin (HCC) 11/22/2015    Past Surgical History:  Procedure Laterality Date   A-FLUTTER ABLATION N/A 11/05/2018   Procedure: A-FLUTTER ABLATION;  Surgeon: Inocencio Soyla Lunger, MD;  Location: [facility];  Service: Cardiovascular;  Laterality: N/A;   CARDIAC CATHETERIZATION     INSERT / REPLACE / REMOVE PACEMAKER     [employer] ICD   MECHANICAL AORTIC VALVE REPLACEMENT     PERIPHERAL ARTERIAL STENT GRAFT     TUBAL LIGATION      Current Medications: Current Meds  Medication Sig   albuterol  (PROVENTIL  HFA;VENTOLIN  HFA) 108 (90 Base) MCG/ACT inhaler Inhale 1-2 puffs into the lungs every 6 (six) hours as needed for wheezing or shortness of breath.   albuterol  (PROVENTIL ) (2.5 MG/3ML) 0.083% nebulizer solution Take  2.5 mg by nebulization every 4 (four) hours as needed for shortness of breath or wheezing.   ALPRAZolam (XANAX) 0.5 MG tablet Take 0.5 mg by mouth 3 (three) times daily as needed for anxiety.    amiodarone  (PACERONE ) 200 MG tablet TAKE 1/2 TABLET BY MOUTH DAILY   carvedilol  (COREG ) 25 MG tablet TAKE 1 TABLET BY MOUTH TWICE A DAY (Patient taking differently: Take 25 mg by mouth 2 (two) times daily  with a meal.)   clopidogrel (PLAVIX) 75 MG tablet Take 75 mg by mouth daily after breakfast.    Cyanocobalamin (B-12) 500 MCG TABS Take 3 tablets by mouth daily.   Docusate Sodium (DSS) 100 MG CAPS Take 100 mg by mouth daily.   doxycycline (VIBRAMYCIN) 100 MG capsule Take 100 mg by mouth 2 (two) times daily.   ENTRESTO  49-51 MG TAKE 1 TABLET BY MOUTH TWICE A DAY   EPIPEN 2-PAK 0.3 MG/0.3ML SOAJ injection Inject 0.3 mg into the muscle once.    ezetimibe (ZETIA) 10 MG tablet Take 10 mg by mouth daily.   fluticasone (FLONASE) 50 MCG/ACT nasal spray Place 2 sprays into both nostrils daily as needed for allergies.    furosemide  (LASIX ) 40 MG tablet Take 80 mg by mouth 2 (two) times daily.   gabapentin (NEURONTIN) 300 MG capsule Take 300 mg by mouth 3 (three) times daily.    Galcanezumab -gnlm (EMGALITY ) 120 MG/ML SOAJ Inject 120 mg into the skin every 30 (thirty) days.   glimepiride (AMARYL) 4 MG tablet Take 4 mg by mouth daily with breakfast.    hydrALAZINE (APRESOLINE) 25 MG tablet Take 25 mg by mouth 3 (three) times daily.   HYDROcodone-acetaminophen  (NORCO) 10-325 MG tablet Take 1 tablet by mouth 4 (four) times daily as needed for moderate pain or severe pain.   levothyroxine (SYNTHROID) 100 MCG tablet Take 100 mcg by mouth daily.   linaclotide (LINZESS) 72 MCG capsule Take 72 mcg by mouth daily as needed (constipation).   loratadine (CLARITIN) 10 MG tablet Take 10 mg by mouth at bedtime.    metFORMIN (GLUCOPHAGE) 500 MG tablet Take 500 mg by mouth 2 (two) times daily with a meal.    montelukast (SINGULAIR) 10 MG tablet Take 10 mg by mouth at bedtime.    Multiple Vitamins-Minerals (CENTRUM SILVER ULTRA WOMENS) TABS Take 1 tablet by mouth daily. Unknown strength   nystatin (MYCOSTATIN/NYSTOP) powder Apply 1 application topically 3 (three) times daily. Unknown strength   omeprazole (PRILOSEC) 20 MG capsule Take 20 mg by mouth at bedtime.    ondansetron  (ZOFRAN -ODT) 4 MG disintegrating tablet Take  4 mg by mouth every 6 (six) hours.   Potassium Chloride  ER 20 MEQ TBCR Take 1 tablet (20 mEq total) by mouth daily.   predniSONE (DELTASONE) 10 MG tablet Take 10 mg by mouth daily with breakfast.   Red Yeast Rice 600 MG TABS Take 2 tablets by mouth daily.   rosuvastatin  (CRESTOR ) 40 MG tablet Take 1 tablet (40 mg total) by mouth daily.   spironolactone  (ALDACTONE ) 25 MG tablet TAKE 1/2 TABLET BY MOUTH EVERY DAY   TRELEGY ELLIPTA 200-62.5-25 MCG/ACT AEPB Inhale 1 puff into the lungs daily.   Ubrogepant  (UBRELVY ) 50 MG TABS Take 1 tablet (50 mg total) by mouth as needed (migraine). May repeat a dose in 2 hours if needed. Max dose 2 pills in 24 hours   warfarin (COUMADIN) 4 MG tablet Take 4 mg by mouth daily.   warfarin (COUMADIN) 5 MG tablet Take 5 mg by  mouth See admin instructions. Take 6 mg on Sunday, Tuesday, and  Thursday . Take 5 mg on all other days     Allergies:   Iodinated contrast media and Ioxaglate   Social History   Socioeconomic History   Marital status: Single    Spouse name: Not on file   Number of children: Not on file   Years of education: Not on file   Highest education level: Not on file  Occupational History   Not on file  Tobacco Use   Smoking status: Former   Smokeless tobacco: Never  Vaping Use   Vaping status: Never Used  Substance and Sexual Activity   Alcohol use: No   Drug use: No   Sexual activity: Not on file  Other Topics Concern   Not on file  Social History Narrative   Lives alone   Social Drivers of Health   Financial Resource Strain: Not on file  Food Insecurity: Not on file  Transportation Needs: No Transportation Needs (05/14/2019)   PRAPARE - Administrator, Civil Service (Medical): No    Lack of Transportation (Non-Medical): No  Physical Activity: Not on file  Stress: Not on file  Social Connections: Not on file     Family History: The patient's family history includes Heart disease in her brother. ROS:   Please see  the history of present illness.    All 14 point review of systems negative except as described per history of present illness  EKGs/Labs/Other Studies Reviewed:    EKG Interpretation Date/Time:  Monday May 25 2024 10:17:34 EDT Ventricular Rate:  75 PR Interval:  224 QRS Duration:  116 QT Interval:  444 QTC Calculation: 495 R Axis:   9  Text Interpretation: Sinus rhythm with 1st degree A-V block Left ventricular hypertrophy with QRS widening Prolonged QT Abnormal ECG When compared with ECG of 16-Dec-2023 16:24, Premature atrial complexes are no longer Present Confirmed by Bernie Charleston [phone]) on 05/25/2024 10:28:00 AM    Recent Labs: No results found for requested labs within last 365 days.  Recent Lipid Panel    Component Value Date/Time   CHOL 176 06/19/2021 1624   TRIG 108 06/19/2021 1624   HDL 48 06/19/2021 1624   CHOLHDL 3.7 06/19/2021 1624   CHOLHDL 4.3 07/04/2007 0815   VLDL 28 07/04/2007 0815   LDLCALC 108 (H) 06/19/2021 1624    Physical Exam:    VS:  BP (!) 130/90   Pulse 75   Ht 6' (1.829 m)   Wt (!) 320 lb (145.2 kg)   SpO2 96%   BMI 43.40 kg/m     Wt Readings from Last 3 Encounters:  05/25/24 (!) 320 lb (145.2 kg)  12/16/23 (!) 329 lb (149.2 kg)  11/18/23 (!) 336 lb (152.4 kg)     GEN:  Well nourished, well developed in no acute distress HEENT: Normal NECK: No JVD; No carotid bruits LYMPHATICS: No lymphadenopathy CARDIAC: RRR, no murmurs, no rubs, no gallops RESPIRATORY:  Clear to auscultation without rales, wheezing or rhonchi  ABDOMEN: Soft, non-tender, non-distended MUSCULOSKELETAL:  No edema; No deformity  SKIN: Warm and dry LOWER EXTREMITIES: no swelling NEUROLOGIC:  Alert and oriented x 3 PSYCHIATRIC:  Normal affect   ASSESSMENT:    1. Paroxysmal atrial flutter (HCC)   2. Coronary artery disease involving native coronary artery of native heart without angina pectoris   3. Dilated cardiomyopathy (HCC)   4. Paroxysmal atrial  fibrillation (HCC)   5. Morbid obesity (  HCC)   6. Dyslipidemia    PLAN:    In order of problems listed above:  Paroxysmal atrial flutter maintained sinus rhythm.  Continue anticoagulation. Status post aortic valve replacement stable anticoagulated which I will continue. History of cardiomyopathy she described to have more shortness of breath.  Will schedule her to have echocardiogram done to reassess left ventricle ejection fraction.  As a part of evaluation we will do proBNP as well as complete metabolic panel and CBC. Dyslipidemia.  Last LDL I have from February 13, 2024 with LDL to 111 HDL 47 we will do fasting lipid profile today. Morbid obesity clearly contributing she understands she need to lose some weight   Medication Adjustments/Labs and Tests Ordered: Current medicines are reviewed at length with the patient today.  Concerns regarding medicines are outlined above.  Orders Placed This Encounter  Procedures   EKG 12-Lead   Medication changes: No orders of the defined types were placed in this encounter.   Signed, Lamar DOROTHA Fitch, MD, [facility] 05/25/2024 10:37 AM     [facility]

## 2024-05-26 ENCOUNTER — Ambulatory Visit: Payer: Self-pay

## 2024-05-26 LAB — COMPREHENSIVE METABOLIC PANEL WITH GFR
ALT: 14 IU/L (ref 0–32)
AST: 13 IU/L (ref 0–40)
Albumin: 3.8 g/dL — ABNORMAL LOW (ref 3.9–4.9)
Alkaline Phosphatase: 58 IU/L (ref 44–121)
BUN/Creatinine Ratio: 23 (ref 12–28)
BUN: 24 mg/dL (ref 8–27)
Bilirubin Total: 0.3 mg/dL (ref 0.0–1.2)
CO2: 23 mmol/L (ref 20–29)
Calcium: 9 mg/dL (ref 8.7–10.3)
Chloride: 103 mmol/L (ref 96–106)
Creatinine, Ser: 1.06 mg/dL — ABNORMAL HIGH (ref 0.57–1.00)
Globulin, Total: 2.9 g/dL (ref 1.5–4.5)
Glucose: 91 mg/dL (ref 70–99)
Potassium: 4.7 mmol/L (ref 3.5–5.2)
Sodium: 139 mmol/L (ref 134–144)
Total Protein: 6.7 g/dL (ref 6.0–8.5)
eGFR: 59 mL/min/1.73 — ABNORMAL LOW (ref >59)

## 2024-05-26 LAB — LIPID PANEL
Chol/HDL Ratio: 3.7 ratio (ref 0.0–4.4)
Cholesterol, Total: 162 mg/dL (ref 100–199)
HDL: 44 mg/dL (ref >39)
LDL Chol Calc (NIH): 100 mg/dL — ABNORMAL HIGH (ref 0–99)
Triglycerides: 98 mg/dL (ref 0–149)
VLDL Cholesterol Cal: 18 mg/dL (ref 5–40)

## 2024-05-26 LAB — PRO B NATRIURETIC PEPTIDE: NT-Pro BNP: 489 pg/mL — ABNORMAL HIGH (ref 0–287)

## 2024-05-26 LAB — CBC
Hematocrit: 40.3 % (ref 34.0–46.6)
Hemoglobin: 12.3 g/dL (ref 11.1–15.9)
MCH: 27.8 pg (ref 26.6–33.0)
MCHC: 30.5 g/dL — ABNORMAL LOW (ref 31.5–35.7)
MCV: 91 fL (ref 79–97)
Platelets: 210 x10E3/uL (ref 150–450)
RBC: 4.43 x10E6/uL (ref 3.77–5.28)
RDW: 14.3 % (ref 11.7–15.4)
WBC: 6.1 x10E3/uL (ref 3.4–10.8)

## 2024-05-27 DIAGNOSIS — R791 Abnormal coagulation profile: Secondary | ICD-10-CM | POA: Diagnosis not present

## 2024-06-02 DIAGNOSIS — G4733 Obstructive sleep apnea (adult) (pediatric): Secondary | ICD-10-CM | POA: Diagnosis not present

## 2024-06-03 DIAGNOSIS — R791 Abnormal coagulation profile: Secondary | ICD-10-CM | POA: Diagnosis not present

## 2024-06-04 ENCOUNTER — Ambulatory Visit: Payer: Self-pay | Admitting: Cardiology

## 2024-06-10 DIAGNOSIS — J449 Chronic obstructive pulmonary disease, unspecified: Secondary | ICD-10-CM | POA: Diagnosis not present

## 2024-06-10 DIAGNOSIS — M1712 Unilateral primary osteoarthritis, left knee: Secondary | ICD-10-CM | POA: Diagnosis not present

## 2024-06-10 DIAGNOSIS — M172 Bilateral post-traumatic osteoarthritis of knee: Secondary | ICD-10-CM | POA: Diagnosis not present

## 2024-06-10 DIAGNOSIS — R791 Abnormal coagulation profile: Secondary | ICD-10-CM | POA: Diagnosis not present

## 2024-06-11 DIAGNOSIS — J4540 Moderate persistent asthma, uncomplicated: Secondary | ICD-10-CM | POA: Diagnosis not present

## 2024-06-11 DIAGNOSIS — G4733 Obstructive sleep apnea (adult) (pediatric): Secondary | ICD-10-CM | POA: Diagnosis not present

## 2024-06-11 DIAGNOSIS — J301 Allergic rhinitis due to pollen: Secondary | ICD-10-CM | POA: Diagnosis not present

## 2024-06-12 ENCOUNTER — Ambulatory Visit: Attending: Cardiology

## 2024-06-12 ENCOUNTER — Telehealth: Payer: Self-pay

## 2024-06-12 DIAGNOSIS — R0609 Other forms of dyspnea: Secondary | ICD-10-CM

## 2024-06-12 NOTE — Telephone Encounter (Signed)
 Lab Results reviewed with pt as per Dr. Karry note.  Pt verbalized understanding and had no additional questions. Routed to PCP

## 2024-06-14 LAB — ECHOCARDIOGRAM COMPLETE
AR max vel: 1.36 cm2
AV Area VTI: 1.41 cm2
AV Area mean vel: 1.32 cm2
AV Mean grad: 12.6 mmHg
AV Peak grad: 24.2 mmHg
Ao pk vel: 2.46 m/s
Area-P 1/2: 3.57 cm2
MV M vel: 3.62 m/s
MV Peak grad: 52.4 mmHg
S' Lateral: 6.1 cm

## 2024-06-14 NOTE — Progress Notes (Unsigned)
 No chief complaint on file.   HISTORY OF PRESENT ILLNESS:  06/14/24 ALL:  Shannon Baxter returns for follow up for migraines.   03/06/2023 ALL: Shannon Baxter is a 63 y.o. female here today for follow up for migraines. She was last seen by Dr Rush 08/2022. She was doing well on Emgality  but having worsening headaches due to being off injections for previous 3 months. Emgality  restarted. Ubrelvy  started for abortive therapy. Since, she reports migraines are fairly stable. She has not taken injections regularly due to pharmacy not keeping in stock. Migraines are usually well controlled when taking injection. She does use Ubrelvy  as needed and feels it helps.   She is followed closely by care team. She reports Dm is well controlled. BP is running low. Atrial fib followed by cardiology. She did have ICD shock 03/04/2023. She was seen by PCP this morning. They are following closely. She recently returned from vacation at the beach. She is tired but otherwise doing ok.   HISTORY (copied from Dr Merna previous note)  Brief HPI: 63 year old female with a history of aortic stenosis s/p mechanical AVR on coumadin, dilated cardiomyopathy, aflutter s/p ablation and ICD, PVD, CAD, COPD, HTN, HLD, left putamen infarct 2008 who follows in clinic for chronic migraines.  At her last visit she was started on Emgality  for migraine prevention. CTH was ordered.  Interval History: Headaches are less severe from her last visit and are no longer constant. They had improved significantly with Emgality , but she ran out of it 3 months ago and headaches have worsened since then. She is averaging 3-4 migraines per week. She takes Tylenol  as needed which does not help much and upsets her stomach.   [facility] 07/31/21 was unremarkable.  Headache days per month: 14 Headache free days per month: 16  Current Headache Regimen: Preventative: Emgality  120 mg monthly (currently out of medication) Abortive: Tylenol   Prior Therapies                                   Gabapentin 300 mg TID Carvedilol  25 mg BID Valsartan  51 mg BID Emgality  120 mg monthly - effective   REVIEW OF SYSTEMS: Out of a complete 14 system review of symptoms, the patient complains only of the following symptoms, headaches, fatigue and all other reviewed systems are negative.   ALLERGIES: Allergies  Allergen Reactions   Iodinated Contrast Media Hives and Rash   Ioxaglate Hives     HOME MEDICATIONS: Outpatient Medications Prior to Visit  Medication Sig Dispense Refill   albuterol  (PROVENTIL  HFA;VENTOLIN  HFA) 108 (90 Base) MCG/ACT inhaler Inhale 1-2 puffs into the lungs every 6 (six) hours as needed for wheezing or shortness of breath.     albuterol  (PROVENTIL ) (2.5 MG/3ML) 0.083% nebulizer solution Take 2.5 mg by nebulization every 4 (four) hours as needed for shortness of breath or wheezing.     ALPRAZolam (XANAX) 0.5 MG tablet Take 0.5 mg by mouth 3 (three) times daily as needed for anxiety.   0   amiodarone  (PACERONE ) 200 MG tablet TAKE 1/2 TABLET BY MOUTH DAILY 90 tablet 3   carvedilol  (COREG ) 25 MG tablet TAKE 1 TABLET BY MOUTH TWICE A DAY (Patient taking differently: Take 25 mg by mouth 2 (two) times daily with a meal.) 180 tablet 3   clopidogrel (PLAVIX) 75 MG tablet Take 75 mg by mouth daily after breakfast.   6   Cyanocobalamin (  B-12) 500 MCG TABS Take 3 tablets by mouth daily.     Docusate Sodium (DSS) 100 MG CAPS Take 100 mg by mouth daily.     doxycycline (VIBRAMYCIN) 100 MG capsule Take 100 mg by mouth 2 (two) times daily.     ENTRESTO  49-51 MG TAKE 1 TABLET BY MOUTH TWICE A DAY 180 tablet 3   EPIPEN 2-PAK 0.3 MG/0.3ML SOAJ injection Inject 0.3 mg into the muscle once.   0   ezetimibe (ZETIA) 10 MG tablet Take 10 mg by mouth daily.     fluticasone (FLONASE) 50 MCG/ACT nasal spray Place 2 sprays into both nostrils daily as needed for allergies.      furosemide  (LASIX ) 40 MG tablet Take 80 mg by mouth 2 (two) times daily.      gabapentin (NEURONTIN) 300 MG capsule Take 300 mg by mouth 3 (three) times daily.   5   Galcanezumab -gnlm (EMGALITY ) 120 MG/ML SOAJ Inject 120 mg into the skin every 30 (thirty) days. 1 mL 2   glimepiride (AMARYL) 4 MG tablet Take 4 mg by mouth daily with breakfast.      hydrALAZINE (APRESOLINE) 25 MG tablet Take 25 mg by mouth 3 (three) times daily.     HYDROcodone-acetaminophen  (NORCO) 10-325 MG tablet Take 1 tablet by mouth 4 (four) times daily as needed for moderate pain or severe pain.     levothyroxine (SYNTHROID) 100 MCG tablet Take 100 mcg by mouth daily.     linaclotide (LINZESS) 72 MCG capsule Take 72 mcg by mouth daily as needed (constipation).     loratadine (CLARITIN) 10 MG tablet Take 10 mg by mouth at bedtime.      metFORMIN (GLUCOPHAGE) 500 MG tablet Take 500 mg by mouth 2 (two) times daily with a meal.      montelukast (SINGULAIR) 10 MG tablet Take 10 mg by mouth at bedtime.      Multiple Vitamins-Minerals (CENTRUM SILVER ULTRA WOMENS) TABS Take 1 tablet by mouth daily. Unknown strength     nystatin (MYCOSTATIN/NYSTOP) powder Apply 1 application topically 3 (three) times daily. Unknown strength     omeprazole (PRILOSEC) 20 MG capsule Take 20 mg by mouth at bedtime.      ondansetron  (ZOFRAN -ODT) 4 MG disintegrating tablet Take 4 mg by mouth every 6 (six) hours.     Potassium Chloride  ER 20 MEQ TBCR Take 1 tablet (20 mEq total) by mouth daily. 90 tablet 3   predniSONE (DELTASONE) 10 MG tablet Take 10 mg by mouth daily with breakfast.     Red Yeast Rice 600 MG TABS Take 2 tablets by mouth daily.     rosuvastatin  (CRESTOR ) 40 MG tablet Take 1 tablet (40 mg total) by mouth daily. 90 tablet 3   spironolactone  (ALDACTONE ) 25 MG tablet TAKE 1/2 TABLET BY MOUTH EVERY DAY 45 tablet 2   TRELEGY ELLIPTA 200-62.5-25 MCG/ACT AEPB Inhale 1 puff into the lungs daily.     Ubrogepant  (UBRELVY ) 50 MG TABS Take 1 tablet (50 mg total) by mouth as needed (migraine). May repeat a dose in 2 hours if  needed. Max dose 2 pills in 24 hours 16 tablet 1   warfarin (COUMADIN) 4 MG tablet Take 4 mg by mouth daily.     warfarin (COUMADIN) 5 MG tablet Take 5 mg by mouth See admin instructions. Take 6 mg on Sunday, Tuesday, and  Thursday . Take 5 mg on all other days     No facility-administered medications prior to visit.  PAST MEDICAL HISTORY: Past Medical History:  Diagnosis Date   Acute on chronic systolic heart failure (HCC) 02/21/2016   Allergy    Aortic insufficiency    Aortic stenosis    Arthritis    Asthma    Atherosclerosis of native arteries of extremities with intermittent claudication, bilateral legs (HCC) 08/04/2015   Carotid atherosclerosis 07/26/2015   CHF (congestive heart failure) (HCC)    Chronic systolic congestive heart failure (HCC) 12/06/2016   Claudication (HCC)    Congestive heart failure (CHF) (HCC)    Coronary artery disease    Coronary artery disease involving native coronary artery of native heart without angina pectoris 07/26/2015   Diabetes mellitus without complication (HCC)    Dilated cardiomyopathy (HCC) 09/03/2016   Overview:  Ejection fraction 20-25%   Dyslipidemia 07/26/2015   Dyspnea on exertion 10/26/2015   Essential hypertension 07/26/2015   GERD (gastroesophageal reflux disease)    Heart murmur    History of mechanical aortic valve replacement 03/29/2016   Overview:  May 2017. [facility] 23 mm  Formatting of this note might be different from the original. Overview:  May 2017. [facility] 23 mm Formatting of this note might be different from the original. May 2017. [facility] 23 mm   History of tobacco abuse 07/26/2015   Hyperlipidemia    Hypertension    Hypothyroidism 01/23/2016   Intermittent claudication (HCC) 07/26/2015   Morbid obesity (HCC) 12/06/2016   Nutritional and metabolic cardiomyopathy (HCC)    Paroxysmal atrial fibrillation (HCC) 03/06/2016   Paroxysmal atrial flutter (HCC) 09/01/2018   Peripheral vascular disease (HCC)  07/26/2015   Presence of automatic implantable cardioverter-defibrillator 10/23/2016   PVD (peripheral vascular disease) (HCC)    Rheumatic aortic valve insufficiency 03/05/2016   Sleep apnea    Stroke (HCC)    Type 2 diabetes mellitus with diabetic peripheral angiopathy without gangrene, without long-term current use of insulin (HCC) 11/22/2015     PAST SURGICAL HISTORY: Past Surgical History:  Procedure Laterality Date   A-FLUTTER ABLATION N/A 11/05/2018   Procedure: A-FLUTTER ABLATION;  Surgeon: Inocencio Soyla Lunger, MD;  Location: [facility];  Service: Cardiovascular;  Laterality: N/A;   CARDIAC CATHETERIZATION     INSERT / REPLACE / REMOVE PACEMAKER     [employer] ICD   MECHANICAL AORTIC VALVE REPLACEMENT     PERIPHERAL ARTERIAL STENT GRAFT     TUBAL LIGATION       FAMILY HISTORY: Family History  Problem Relation Age of Onset   Heart disease Brother      SOCIAL HISTORY: Social History   Socioeconomic History   Marital status: Single    Spouse name: Not on file   Number of children: Not on file   Years of education: Not on file   Highest education level: Not on file  Occupational History   Not on file  Tobacco Use   Smoking status: Former   Smokeless tobacco: Never  Vaping Use   Vaping status: Never Used  Substance and Sexual Activity   Alcohol use: No   Drug use: No   Sexual activity: Not on file  Other Topics Concern   Not on file  Social History Narrative   Lives alone   Social Drivers of Health   Financial Resource Strain: Not on file  Food Insecurity: Not on file  Transportation Needs: No Transportation Needs (05/14/2019)   PRAPARE - Transportation    Lack of Transportation (Medical): No    Lack of Transportation (Non-Medical): No  Physical Activity: Not on file  Stress: Not on file  Social Connections: Not on file  Intimate Partner Violence: Not on file     PHYSICAL EXAM  There were no vitals filed for this visit.  There is no  height or weight on file to calculate BMI.  Generalized: Well developed, in no acute distress  Cardiology: normal rate and rhythm, no murmur auscultated  Respiratory: clear to auscultation bilaterally    Neurological examination  Mentation: Alert oriented to time, place, history taking. Follows all commands speech and language fluent Cranial nerve II-XII: Pupils were equal round reactive to light. Extraocular movements were full, visual field were full on confrontational test. Facial sensation and strength were normal. Head turning and shoulder shrug  were normal and symmetric. Motor: The motor testing reveals 5 over 5 strength of all 4 extremities. Good symmetric motor tone is noted throughout.  Gait and station: Gait is arthritic, stable with Rolator   DIAGNOSTIC DATA (LABS, IMAGING, TESTING) - I reviewed patient records, labs, notes, testing and imaging myself where available.  Lab Results  Component Value Date   WBC 6.1 05/25/2024   HGB 12.3 05/25/2024   HCT 40.3 05/25/2024   MCV 91 05/25/2024   PLT 210 05/25/2024      Component Value Date/Time   NA 139 05/25/2024 1048   K 4.7 05/25/2024 1048   CL 103 05/25/2024 1048   CO2 23 05/25/2024 1048   GLUCOSE 91 05/25/2024 1048   GLUCOSE 99 03/22/2011 1200   BUN 24 05/25/2024 1048   CREATININE 1.06 (H) 05/25/2024 1048   CALCIUM  9.0 05/25/2024 1048   PROT 6.7 05/25/2024 1048   ALBUMIN 3.8 (L) 05/25/2024 1048   AST 13 05/25/2024 1048   ALT 14 05/25/2024 1048   ALKPHOS 58 05/25/2024 1048   BILITOT 0.3 05/25/2024 1048   GFRNONAA 69 06/03/2020 1356   GFRAA 79 06/03/2020 1356   Lab Results  Component Value Date   CHOL 162 05/25/2024   HDL 44 05/25/2024   LDLCALC 100 (H) 05/25/2024   TRIG 98 05/25/2024   CHOLHDL 3.7 05/25/2024   Lab Results  Component Value Date   HGBA1C  07/04/2007    6.1 (NOTE)   The ADA recommends the following therapeutic goals for glycemic   control related to Hgb A1C measurement:   Goal of Therapy:    < 7.0% Hgb A1C   Action Suggested:  > 8.0% Hgb A1C   Ref:  Diabetes Care, 22, Suppl. 1, 1999   No results found for: VITAMINB12 Lab Results  Component Value Date   TSH 3.600 03/21/2022        No data to display               No data to display           ASSESSMENT AND PLAN  63 y.o. year old female  has a past medical history of Acute on chronic systolic heart failure (HCC) (94/97/7982), Allergy, Aortic insufficiency, Aortic stenosis, Arthritis, Asthma, Atherosclerosis of native arteries of extremities with intermittent claudication, bilateral legs (HCC) (08/04/2015), Carotid atherosclerosis (07/26/2015), CHF (congestive heart failure) (HCC), Chronic systolic congestive heart failure (HCC) (12/06/2016), Claudication (HCC), Congestive heart failure (CHF) (HCC), Coronary artery disease, Coronary artery disease involving native coronary artery of native heart without angina pectoris (07/26/2015), Diabetes mellitus without complication (HCC), Dilated cardiomyopathy (HCC) (09/03/2016), Dyslipidemia (07/26/2015), Dyspnea on exertion (10/26/2015), Essential hypertension (07/26/2015), GERD (gastroesophageal reflux disease), Heart murmur, History of mechanical aortic valve replacement (03/29/2016), History  of tobacco abuse (07/26/2015), Hyperlipidemia, Hypertension, Hypothyroidism (01/23/2016), Intermittent claudication (HCC) (07/26/2015), Morbid obesity (HCC) (12/06/2016), Nutritional and metabolic cardiomyopathy (HCC), Paroxysmal atrial fibrillation (HCC) (03/06/2016), Paroxysmal atrial flutter (HCC) (09/01/2018), Peripheral vascular disease (HCC) (07/26/2015), Presence of automatic implantable cardioverter-defibrillator (10/23/2016), PVD (peripheral vascular disease) (HCC), Rheumatic aortic valve insufficiency (03/05/2016), Sleep apnea, Stroke (HCC), and Type 2 diabetes mellitus with diabetic peripheral angiopathy without gangrene, without long-term current use of insulin (HCC) (11/22/2015).  here with    No diagnosis found.  Shannon Baxter reports migraines are well managed when taking injections regularly. I have encouraged her to follow up closely with pharmacy to make sure she is ordering injection every month. Consider 3 month refills if able. Continue Emgality  every 30 days and Ubrelvy  as needed. Healthy lifestyle habits encouraged. She will follow up with PCP and care team as directed. She will return to see me in 1 year, sooner if needed. She verbalizes understanding and agreement with this plan.   No orders of the defined types were placed in this encounter.    No orders of the defined types were placed in this encounter.    Greig Forbes, MSN, FNP-C 06/14/2024, 8:03 PM  [facility] [address], [address] [place], [place] [address] [phone]

## 2024-06-14 NOTE — Patient Instructions (Signed)
 Below is our plan:  We will continue [place].   Please make sure you are staying well hydrated. I recommend 50-60 ounces daily. Well balanced diet and regular exercise encouraged. Consistent sleep schedule with 6-8 hours recommended.   Please continue follow up with care team as directed.   Follow up with me in 1 year   You may receive a survey regarding today's visit. I encourage you to leave honest feed back as I do use this information to improve patient care. Thank you for seeing me today!   GENERAL HEADACHE INFORMATION:   Natural supplements: Magnesium Oxide or Magnesium Glycinate 500 mg at bed (up to 800 mg daily) Coenzyme Q10 300 mg in AM Vitamin B2- 200 mg twice a day   Add 1 supplement at a time since even natural supplements can have undesirable side effects. You can sometimes buy supplements cheaper (especially Coenzyme Q10) at www.WebmailGuide.co.za or at [facility].  Migraine with aura: There is increased risk for stroke in women with migraine with aura and a contraindication for the combined contraceptive pill for use by women who have migraine with aura. The risk for women with migraine without aura is lower. However other risk factors like smoking are far more likely to increase stroke risk than migraine. There is a recommendation for no smoking and for the use of OCPs without estrogen such as progestogen only pills particularly for women with migraine with aura.Marland Kitchen People who have migraine headaches with auras may be 3 times more likely to have a stroke caused by a blood clot, compared to migraine patients who don't see auras. Women who take hormone-replacement therapy may be 30 percent more likely to suffer a clot-based stroke than women not taking medication containing estrogen. Other risk factors like smoking and high blood pressure may be  much more important.    Vitamins and herbs that show potential:   Magnesium: Magnesium (250 mg twice a day or 500 mg at bed) has a  relaxant effect on smooth muscles such as blood vessels. Individuals suffering from frequent or daily headache usually have low magnesium levels which can be increase with daily supplementation of 400-750 mg. Three trials found 40-90% average headache reduction  when used as a preventative. Magnesium may help with headaches are aura, the best evidence for magnesium is for migraine with aura is its thought to stop the cortical spreading depression we believe is the pathophysiology of migraine aura.Magnesium also demonstrated the benefit in menstrually related migraine.  Magnesium is part of the messenger system in the serotonin cascade and it is a good muscle relaxant.  It is also useful for constipation which can be a side effect of other medications used to treat migraine. Good sources include nuts, whole grains, and tomatoes. Side Effects: loose stool/diarrhea  Riboflavin (vitamin B 2) 200 mg twice a day. This vitamin assists nerve cells in the production of ATP a principal energy storing molecule.  It is necessary for many chemical reactions in the body.  There have been at least 3 clinical trials of riboflavin using 400 mg per day all of which suggested that migraine frequency can be decreased.  All 3 trials showed significant improvement in over half of migraine sufferers.  The supplement is found in bread, cereal, milk, meat, and poultry.  Most Americans get more riboflavin than the recommended daily allowance, however riboflavin deficiency is not necessary for the supplements to help prevent headache. Side effects: energizing, green urine   Coenzyme Q10: This is  present in almost all cells in the body and is critical component for the conversion of energy.  Recent studies have shown that a nutritional supplement of CoQ10 can reduce the frequency of migraine attacks by improving the energy production of cells as with riboflavin.  Doses of 150 mg twice a day have been shown to be effective.   Melatonin:  Increasing evidence shows correlation between melatonin secretion and headache conditions.  Melatonin supplementation has decreased headache intensity and duration.  It is widely used as a sleep aid.  Sleep is natures way of dealing with migraine.  A dose of 3 mg is recommended to start for headaches including cluster headache. Higher doses up to 15 mg has been reviewed for use in Cluster headache and have been used. The rationale behind using melatonin for cluster is that many theories regarding the cause of Cluster headache center around the disruption of the normal circadian rhythm in the brain.  This helps restore the normal circadian rhythm.   HEADACHE DIET: Foods and beverages which may trigger migraine Note that only 20% of headache patients are food sensitive. You will know if you are food sensitive if you get a headache consistently 20 minutes to 2 hours after eating a certain food. Only cut out a food if it causes headaches, otherwise you might remove foods you enjoy! What matters most for diet is to eat a well balanced healthy diet full of vegetables and low fat protein, and to not miss meals.   Chocolate, other sweets ALL cheeses except cottage and cream cheese Dairy products, yogurt, sour cream, ice cream Liver Meat extracts (Bovril, Marmite, meat tenderizers) Meats or fish which have undergone aging, fermenting, pickling or smoking. These include: Hotdogs,salami,Lox,sausage, mortadellas,smoked salmon, pepperoni, Pickled herring Pods of broad bean (English beans, Chinese pea pods, [place] (fava) beans, lima and navy beans Ripe avocado, ripe banana Yeast extracts or active yeast preparations such as Brewer's or Fleishman's (commercial bakes goods are permitted) Tomato based foods, pizza (lasagna, etc.)   MSG (monosodium glutamate) is disguised as many things; look for these common aliases: Monopotassium glutamate Autolysed yeast Hydrolysed protein Sodium  caseinate "flavorings" "all natural preservatives" Nutrasweet   Avoid all other foods that convincingly provoke headaches.   Resources: The Dizzy [place] Laundry Your Headache Diet, migrainestrong.com  https://zamora-andrews.com/   Caffeine and Migraine For patients that have migraine, caffeine intake more than 3 days per week can lead to dependency and increased migraine frequency. I would recommend cutting back on your caffeine intake as best you can. The recommended amount of caffeine is 200-300 mg daily, although migraine patients may experience dependency at even lower doses. While you may notice an increase in headache temporarily, cutting back will be helpful for headaches in the long run. For more information on caffeine and migraine, visit: https://americanmigrainefoundation.org/resource-library/caffeine-and-migraine/   Headache Prevention Strategies:   1. Maintain a headache diary; learn to identify and avoid triggers.  - This can be a simple note where you log when you had a headache, associated symptoms, and medications used - There are several smartphone apps developed to help track migraines: Migraine Buddy, Migraine Monitor, Curelator N1-Headache App   Common triggers include: Emotional triggers: Emotional/Upset family or friends Emotional/Upset occupation Business reversal/success Anticipation anxiety Crisis-serious Post-crisis periodNew job/position   Physical triggers: Vacation Day Weekend Strenuous Exercise High Altitude Location New Move Menstrual Day Physical Illness Oversleep/Not enough sleep Weather changes Light: Photophobia or light sesnitivity treatment involves a balance between desensitization and reduction in overly strong input.  Use dark polarized glasses outside, but not inside. Avoid bright or fluorescent light, but do not dim environment to the point that going into a normally lit room hurts. Consider  FL-41 tint lenses, which reduce the most irritating wavelengths without blocking too much light.  These can be obtained at axonoptics.com or theraspecs.com Foods: see list above.   2. Limit use of acute treatments (over-the-counter medications, triptans, etc.) to no more than 2 days per week or 10 days per month to prevent medication overuse headache (rebound headache).     3. Follow a regular schedule (including weekends and holidays): Don't skip meals. Eat a balanced diet. 8 hours of sleep nightly. Minimize stress. Exercise 30 minutes per day. Being overweight is associated with a 5 times increased risk of chronic migraine. Keep well hydrated and drink 6-8 glasses of water per day.   4. Initiate non-pharmacologic measures at the earliest onset of your headache. Rest and quiet environment. Relax and reduce stress. Breathe2Relax is a free app that can instruct you on    some simple relaxtion and breathing techniques. Http://Dawnbuse.com is a    free website that provides teaching videos on relaxation.  Also, there are  many apps that   can be downloaded for "mindful" relaxation.  An app called YOGA NIDRA will help walk you through mindfulness. Another app called Calm can be downloaded to give you a structured mindfulness guide with daily reminders and skill development. Headspace for guided meditation Mindfulness Based Stress Reduction Online Course: www.palousemindfulness.com Cold compresses.   5. Don't wait!! Take the maximum allowable dosage of prescribed medication at the first sign of migraine.   6. Compliance:  Take prescribed medication regularly as directed and at the first sign of a migraine.   7. Communicate:  Call your physician when problems arise, especially if your headaches change, increase in frequency/severity, or become associated with neurological symptoms (weakness, numbness, slurred speech, etc.). Proceed to emergency room if you experience new or worsening symptoms or  symptoms do not resolve, if you have new neurologic symptoms or if headache is severe, or for any concerning symptom.   8. Headache/pain management therapies: Consider various complementary methods, including medication, behavioral therapy, psychological counselling, biofeedback, massage therapy, acupuncture, dry needling, and other modalities.  Such measures may reduce the need for medications. Counseling for pain management, where patients learn to function and ignore/minimize their pain, seems to work very well.   9. Recommend changing family's attention and focus away from patient's headaches. Instead, emphasize daily activities. If first question of day is 'How are your headaches/Do you have a headache today?', then patient will constantly think about headaches, thus making them worse. Goal is to re-direct attention away from headaches, toward daily activities and other distractions.   10. Helpful Websites: www.AmericanHeadacheSociety.org PatentHood.ch www.headaches.org TightMarket.nl www.achenet.org

## 2024-06-15 ENCOUNTER — Encounter: Payer: Self-pay | Admitting: Family Medicine

## 2024-06-15 ENCOUNTER — Ambulatory Visit (INDEPENDENT_AMBULATORY_CARE_PROVIDER_SITE_OTHER): Admitting: Family Medicine

## 2024-06-15 VITALS — BP 107/86 | HR 74 | Ht 72.0 in | Wt 323.6 lb

## 2024-06-15 DIAGNOSIS — G43E09 Chronic migraine with aura, not intractable, without status migrainosus: Secondary | ICD-10-CM | POA: Diagnosis not present

## 2024-06-15 MED ORDER — UBRELVY 50 MG PO TABS: 50.0000 mg | ORAL_TABLET | ORAL | 1 refills | Status: AC | PRN

## 2024-06-15 MED ORDER — EMGALITY 120 MG/ML ~~LOC~~ SOAJ: 120.0000 mg | SUBCUTANEOUS | 3 refills | Status: AC

## 2024-06-19 DIAGNOSIS — Z122 Encounter for screening for malignant neoplasm of respiratory organs: Secondary | ICD-10-CM | POA: Diagnosis not present

## 2024-06-19 DIAGNOSIS — F17210 Nicotine dependence, cigarettes, uncomplicated: Secondary | ICD-10-CM | POA: Diagnosis not present

## 2024-06-19 DIAGNOSIS — Z87891 Personal history of nicotine dependence: Secondary | ICD-10-CM | POA: Diagnosis not present

## 2024-06-19 DIAGNOSIS — J439 Emphysema, unspecified: Secondary | ICD-10-CM | POA: Diagnosis not present

## 2024-06-21 DIAGNOSIS — E1140 Type 2 diabetes mellitus with diabetic neuropathy, unspecified: Secondary | ICD-10-CM | POA: Diagnosis not present

## 2024-06-21 DIAGNOSIS — R2689 Other abnormalities of gait and mobility: Secondary | ICD-10-CM | POA: Diagnosis not present

## 2024-06-21 DIAGNOSIS — J4520 Mild intermittent asthma, uncomplicated: Secondary | ICD-10-CM | POA: Diagnosis not present

## 2024-06-22 DIAGNOSIS — J4540 Moderate persistent asthma, uncomplicated: Secondary | ICD-10-CM | POA: Diagnosis not present

## 2024-06-24 ENCOUNTER — Encounter (HOSPITAL_BASED_OUTPATIENT_CLINIC_OR_DEPARTMENT_OTHER): Payer: Self-pay

## 2024-06-24 ENCOUNTER — Telehealth: Payer: Self-pay

## 2024-06-24 ENCOUNTER — Ambulatory Visit (HOSPITAL_BASED_OUTPATIENT_CLINIC_OR_DEPARTMENT_OTHER): Admitting: Student

## 2024-06-24 ENCOUNTER — Encounter (HOSPITAL_BASED_OUTPATIENT_CLINIC_OR_DEPARTMENT_OTHER): Payer: Self-pay | Admitting: Student

## 2024-06-24 VITALS — BP 115/72 | HR 68 | Temp 98.3°F | Resp 18 | Ht 72.0 in | Wt 318.1 lb

## 2024-06-24 DIAGNOSIS — E1151 Type 2 diabetes mellitus with diabetic peripheral angiopathy without gangrene: Secondary | ICD-10-CM

## 2024-06-24 DIAGNOSIS — E785 Hyperlipidemia, unspecified: Secondary | ICD-10-CM

## 2024-06-24 DIAGNOSIS — I480 Paroxysmal atrial fibrillation: Secondary | ICD-10-CM | POA: Diagnosis not present

## 2024-06-24 DIAGNOSIS — I10 Essential (primary) hypertension: Secondary | ICD-10-CM | POA: Diagnosis not present

## 2024-06-24 DIAGNOSIS — G4733 Obstructive sleep apnea (adult) (pediatric): Secondary | ICD-10-CM

## 2024-06-24 DIAGNOSIS — I739 Peripheral vascular disease, unspecified: Secondary | ICD-10-CM

## 2024-06-24 DIAGNOSIS — J9611 Chronic respiratory failure with hypoxia: Secondary | ICD-10-CM | POA: Insufficient documentation

## 2024-06-24 DIAGNOSIS — Z7901 Long term (current) use of anticoagulants: Secondary | ICD-10-CM | POA: Insufficient documentation

## 2024-06-24 DIAGNOSIS — Z7689 Persons encountering health services in other specified circumstances: Secondary | ICD-10-CM | POA: Diagnosis not present

## 2024-06-24 DIAGNOSIS — Z130 Encounter for screening for diseases of the blood and blood-forming organs and certain disorders involving the immune mechanism: Secondary | ICD-10-CM | POA: Diagnosis not present

## 2024-06-24 DIAGNOSIS — F411 Generalized anxiety disorder: Secondary | ICD-10-CM

## 2024-06-24 DIAGNOSIS — G2581 Restless legs syndrome: Secondary | ICD-10-CM | POA: Diagnosis not present

## 2024-06-24 NOTE — Progress Notes (Signed)
 New Patient Office Visit  Subjective    Patient ID: Shannon Baxter, female    DOB: [birth year]  Age: 63 y.o. MRN: [id number]  CC:  Chief Complaint  Patient presents with   Establish Care    Here to establish care.    Discussed the use of AI scribe software for clinical note transcription with the patient, who gave verbal consent to proceed.  History of Present Illness   Shannon Baxter is a 63 year old female who presents for establishment of care.  She has a history of multiple chronic conditions including type 2 diabetes, hypertension, asthma, bronchitis, peripheral vascular disease, and atrial fibrillation. She also manages migraines, restless leg syndrome, and sleep apnea. Her last physical was about three to four months ago, and she is due for blood work and an INR check for her warfarin management. She has a history of a clot in her leg and has stents in both her legs and heart. She follows with several specialists including a cardiologist, pulmonologist, and neurologist.  For her type 2 diabetes, she takes glimepiride with breakfast and has reduced her metformin to 500 mg daily. Her last A1c was 6.3. She also experiences neuropathy associated with her diabetes.  She manages her migraines with Emgality  injections and Ubrelvy  pills. She recently saw her neurologist last week.  She has asthma and bronchitis, for which she uses prednisone as needed during flare-ups. She also uses oxygen  at night with her CPAP machine for sleep apnea.  She is unsure of her last cholesterol levels but reports they were not alarming.  She has been on Xanax  for anxiety and takes it as needed, primarily at night due to restless leg syndrome. She describes her legs as feeling like 'something running through them at night.'  She quit smoking 15 years ago and is up to date on her mammogram and eye exams. She has completed a Cologuard test with normal results.  She has a history of atrial fibrillation and has  undergone aortic valve replacement.      Screenings:  Colon Cancer: cologuard UTD Lung Cancer: not indicated Breast Cancer: mammogram 6 months ago- normal Diabetes: check records  Ophthalmology- UTD - December of 2024 Foot- UTD HLD: LDL 100 on 05/25/24   Outpatient Encounter Medications as of 06/24/2024  Medication Sig   acetaminophen  (TYLENOL ) 650 MG CR tablet Take 2,600 mg by mouth daily.   albuterol  (PROVENTIL  HFA;VENTOLIN  HFA) 108 (90 Base) MCG/ACT inhaler Inhale 1-2 puffs into the lungs every 6 (six) hours as needed for wheezing or shortness of breath.   albuterol  (PROVENTIL ) (2.5 MG/3ML) 0.083% nebulizer solution Take 2.5 mg by nebulization every 4 (four) hours as needed for shortness of breath or wheezing.   ALPRAZolam  (XANAX ) 0.5 MG tablet Take 0.5 mg by mouth 3 (three) times daily as needed for anxiety.    amiodarone  (PACERONE ) 200 MG tablet TAKE 1/2 TABLET BY MOUTH DAILY   carvedilol  (COREG ) 25 MG tablet TAKE 1 TABLET BY MOUTH TWICE A DAY   clopidogrel (PLAVIX) 75 MG tablet Take 75 mg by mouth daily after breakfast.    Cyanocobalamin (B-12) 2000 MCG TABS Take 2 tablets by mouth daily.   Docusate Sodium (DSS) 100 MG CAPS Take 100 mg by mouth daily.   ENTRESTO  49-51 MG TAKE 1 TABLET BY MOUTH TWICE A DAY   EPIPEN 2-PAK 0.3 MG/0.3ML SOAJ injection Inject 0.3 mg into the muscle once.    ezetimibe (ZETIA) 10 MG tablet Take 10 mg by mouth daily.  fluticasone (FLONASE) 50 MCG/ACT nasal spray Place 2 sprays into both nostrils daily as needed for allergies.    furosemide  (LASIX ) 40 MG tablet Take 80 mg by mouth 2 (two) times daily. (Patient taking differently: Take 80 mg by mouth as needed.)   gabapentin (NEURONTIN) 300 MG capsule Take 300 mg by mouth 3 (three) times daily.    Galcanezumab -gnlm (EMGALITY ) 120 MG/ML SOAJ Inject 120 mg into the skin every 30 (thirty) days.   glimepiride (AMARYL) 4 MG tablet Take 4 mg by mouth daily with breakfast.    hydrALAZINE (APRESOLINE) 25 MG tablet  Take 25 mg by mouth 3 (three) times daily.   levothyroxine (SYNTHROID) 100 MCG tablet Take 100 mcg by mouth daily.   linaclotide (LINZESS) 72 MCG capsule Take 72 mcg by mouth daily as needed (constipation).   loratadine (CLARITIN) 10 MG tablet Take 10 mg by mouth at bedtime.    metFORMIN (GLUCOPHAGE) 500 MG tablet Take 500 mg by mouth daily.   montelukast (SINGULAIR) 10 MG tablet Take 10 mg by mouth at bedtime.    Multiple Vitamins-Minerals (CENTRUM SILVER ULTRA WOMENS) TABS Take 1 tablet by mouth daily. Unknown strength   nystatin (MYCOSTATIN/NYSTOP) powder Apply 1 application topically 3 (three) times daily. Unknown strength   omeprazole (PRILOSEC) 20 MG capsule Take 20 mg by mouth at bedtime.    ondansetron  (ZOFRAN -ODT) 4 MG disintegrating tablet Take 4 mg by mouth every 6 (six) hours.   Potassium Chloride  ER 20 MEQ TBCR Take 1 tablet (20 mEq total) by mouth daily.   predniSONE (DELTASONE) 10 MG tablet Take 10 mg by mouth daily with breakfast. (Patient taking differently: Take 10 mg by mouth as needed.)   Red Yeast Rice 600 MG TABS Take 2 tablets by mouth daily.   rosuvastatin  (CRESTOR ) 40 MG tablet Take 1 tablet (40 mg total) by mouth daily.   spironolactone  (ALDACTONE ) 25 MG tablet TAKE 1/2 TABLET BY MOUTH EVERY DAY   TRELEGY ELLIPTA 200-62.5-25 MCG/ACT AEPB Inhale 1 puff into the lungs daily.   Ubrogepant  (UBRELVY ) 50 MG TABS Take 1 tablet (50 mg total) by mouth as needed (migraine). May repeat a dose in 2 hours if needed. Max dose 2 pills in 24 hours   warfarin (COUMADIN) 4 MG tablet Take 4 mg by mouth daily.   warfarin (COUMADIN) 5 MG tablet Take 5 mg by mouth See admin instructions. Take 6 mg on Sunday, Tuesday, and  Thursday . Take 5 mg on all other days   [DISCONTINUED] HYDROcodone-acetaminophen  (NORCO) 10-325 MG tablet Take 1 tablet by mouth 4 (four) times daily as needed for moderate pain or severe pain.   [DISCONTINUED] doxycycline (VIBRAMYCIN) 100 MG capsule Take 100 mg by mouth 2  (two) times daily.   No facility-administered encounter medications on file as of 06/24/2024.    Past Medical History:  Diagnosis Date   Acute on chronic systolic heart failure (HCC) 02/21/2016   Allergy    Aortic insufficiency    Aortic stenosis    Arthritis    Asthma    Atherosclerosis of native arteries of extremities with intermittent claudication, bilateral legs (HCC) 08/04/2015   Carotid atherosclerosis 07/26/2015   CHF (congestive heart failure) (HCC)    Chronic systolic congestive heart failure (HCC) 12/06/2016   Claudication (HCC)    Congestive heart failure (CHF) (HCC)    Coronary artery disease    Coronary artery disease involving native coronary artery of native heart without angina pectoris 07/26/2015   Diabetes mellitus without complication (HCC)  Dilated cardiomyopathy (HCC) 09/03/2016   Overview:  Ejection fraction 20-25%   Dyslipidemia 07/26/2015   Dyspnea on exertion 10/26/2015   Essential hypertension 07/26/2015   GERD (gastroesophageal reflux disease)    Heart murmur    History of mechanical aortic valve replacement 03/29/2016   Overview:  May 2017. [facility] 23 mm  Formatting of this note might be different from the original. Overview:  May 2017. [facility] 23 mm Formatting of this note might be different from the original. May 2017. [facility] 23 mm   History of tobacco abuse 07/26/2015   Hyperlipidemia    Hypertension    Hypothyroidism 01/23/2016   Intermittent claudication (HCC) 07/26/2015   Morbid obesity (HCC) 12/06/2016   Nutritional and metabolic cardiomyopathy (HCC)    Paroxysmal atrial fibrillation (HCC) 03/06/2016   Paroxysmal atrial flutter (HCC) 09/01/2018   Peripheral vascular disease (HCC) 07/26/2015   Presence of automatic implantable cardioverter-defibrillator 10/23/2016   PVD (peripheral vascular disease) (HCC)    Rheumatic aortic valve insufficiency 03/05/2016   Sleep apnea    Stroke (HCC)    Type 2 diabetes mellitus with diabetic  peripheral angiopathy without gangrene, without long-term current use of insulin (HCC) 11/22/2015    Past Surgical History:  Procedure Laterality Date   A-FLUTTER ABLATION N/A 11/05/2018   Procedure: A-FLUTTER ABLATION;  Surgeon: Inocencio Soyla Lunger, MD;  Location: [facility];  Service: Cardiovascular;  Laterality: N/A;   CARDIAC CATHETERIZATION     INSERT / REPLACE / REMOVE PACEMAKER     [employer] ICD   MECHANICAL AORTIC VALVE REPLACEMENT     PERIPHERAL ARTERIAL STENT GRAFT     TUBAL LIGATION      Family History  Problem Relation Age of Onset   Heart Problems Mother    Diabetes Mother        had leg amputation   Heart attack Father    Diabetes Sister    Diabetes Sister    Parkinson's disease Brother     Social History   Socioeconomic History   Marital status: Single    Spouse name: Not on file   Number of children: Not on file   Years of education: Not on file   Highest education level: Not on file  Occupational History   Not on file  Tobacco Use   Smoking status: Former    Current packs/day: 0.00    Average packs/day: 0.3 packs/day for 20.0 years (5.0 ttl pk-yrs)    Types: Cigarettes    Start date: 60    Quit date: 2014    Years since quitting: 11.6    Passive exposure: Past   Smokeless tobacco: Never  Vaping Use   Vaping status: Never Used  Substance and Sexual Activity   Alcohol use: Not Currently    Comment: quit 2010   Drug use: Yes    Types: Crack cocaine   Sexual activity: Not on file  Other Topics Concern   Not on file  Social History Narrative   Lives alone   1 daughter living and 1 deceased daughter   Social Drivers of Health   Financial Resource Strain: Not on file  Food Insecurity: No Food Insecurity (06/24/2024)   Hunger Vital Sign    Worried About Running Out of Food in the Last Year: Never true    Ran Out of Food in the Last Year: Never true  Transportation Needs: No Transportation Needs (06/24/2024)   PRAPARE -  Administrator, Civil Service (Medical):  No    Lack of Transportation (Non-Medical): No  Physical Activity: Not on file  Stress: Not on file  Social Connections: Not on file  Intimate Partner Violence: Not At Risk (06/24/2024)   Humiliation, Afraid, Rape, and Kick questionnaire    Fear of Current or Ex-Partner: No    Emotionally Abused: No    Physically Abused: No    Sexually Abused: No    ROS  Per HPI      Objective    BP 115/72   Pulse 68   Temp 98.3 F (36.8 C) (Oral)   Resp 18   Ht 6' (1.829 m)   Wt (!) 318 lb 1.6 oz (144.3 kg)   SpO2 96%   BMI 43.14 kg/m   Physical Exam Constitutional:      General: She is not in acute distress.    Appearance: Normal appearance. She is obese. She is not ill-appearing.  HENT:     Head: Normocephalic and atraumatic.     Nose: Nose normal.  Eyes:     General: No scleral icterus.    Conjunctiva/sclera: Conjunctivae normal.  Cardiovascular:     Rate and Rhythm: Normal rate. Rhythm irregular.     Heart sounds: Normal heart sounds.     No friction rub.  Pulmonary:     Effort: Pulmonary effort is normal. No respiratory distress.     Breath sounds: Normal breath sounds. No wheezing, rhonchi or rales.  Musculoskeletal:        General: Normal range of motion.     Right lower leg: No edema.     Left lower leg: No edema.  Skin:    General: Skin is warm and dry.     Coloration: Skin is not jaundiced or pale.  Neurological:     General: No focal deficit present.     Mental Status: She is alert.  Psychiatric:        Mood and Affect: Mood normal.        Behavior: Behavior normal.     Last CBC Lab Results  Component Value Date   WBC 6.1 05/25/2024   HGB 12.3 05/25/2024   HCT 40.3 05/25/2024   MCV 91 05/25/2024   MCH 27.8 05/25/2024   RDW 14.3 05/25/2024   PLT 210 05/25/2024   Last metabolic panel Lab Results  Component Value Date   GLUCOSE 91 05/25/2024   NA 139 05/25/2024   K 4.7 05/25/2024   CL 103  05/25/2024   CO2 23 05/25/2024   BUN 24 05/25/2024   CREATININE 1.06 (H) 05/25/2024   EGFR 59 (L) 05/25/2024   CALCIUM  9.0 05/25/2024   PROT 6.7 05/25/2024   ALBUMIN 3.8 (L) 05/25/2024   LABGLOB 2.9 05/25/2024   AGRATIO 1.1 (L) 12/27/2022   BILITOT 0.3 05/25/2024   ALKPHOS 58 05/25/2024   AST 13 05/25/2024   ALT 14 05/25/2024   Last lipids Lab Results  Component Value Date   CHOL 162 05/25/2024   HDL 44 05/25/2024   LDLCALC 100 (H) 05/25/2024   TRIG 98 05/25/2024   CHOLHDL 3.7 05/25/2024   Last hemoglobin A1c Lab Results  Component Value Date   HGBA1C  07/04/2007    6.1 (NOTE)   The ADA recommends the following therapeutic goals for glycemic   control related to Hgb A1C measurement:   Goal of Therapy:   < 7.0% Hgb A1C   Action Suggested:  > 8.0% Hgb A1C   Ref:  Diabetes Care, 22, Suppl. 1, 1999  Assessment & Plan:   Assessment and Plan    Encounter to establish care  Type 2 diabetes mellitus with diabetic polyneuropathy Chronic, stable.  Type 2 diabetes mellitus is well-controlled with an A1c of 6.3 per patient report. She has diabetic polyneuropathy. - Continue glimepiride and metformin.  Restless legs syndrome Supposed chronic issue , not at goal.  Reports symptoms consistent with restless legs syndrome, including leg discomfort at night. No recent iron level check. - Order iron and magnesium levels to evaluate for deficiencies contributing to restless legs syndrome.  Atrial fibrillation Chronic issue, irregular in clinic today.  Currently on warfarin with INR monitoring.  Rate controlled on carvedilol . - Discuss referral to a warfarin clinic for INR management and potential home monitoring. - Reviewed cardiology note from 05/25/2024.-Euvolemic on exam today  Peripheral vascular disease with lower extremity stents Chronic, stable.  Continue to follow with cardiology. - Continue to monitor in clinic. - No pedal edema on exam  today.  Hypertension Chronic, stable.  Hypertension is well-controlled on current regimen.  Hyperlipidemia Chronic, stable.  Hyperlipidemia is being monitored. Recent cholesterol levels not alarming.  Obstructive sleep apnea/chronic hypoxemic respiratory failure Uses CPAP machine at night and supplemental oxygen  as needed.  Generalized anxiety disorder Long-term use of Xanax  for anxiety. Reports decreased efficacy and discussed concerns for memory issues. Interested in tapering off Xanax . - Discuss gradual tapering plan for Xanax  to minimize withdrawal and improve long-term memory.   Morbid obesity - Current BMI today is 43.14.  Baxter plan to discuss diet and weight loss in more depth at next visit.  Unsure if she has been on a GLP-1 before but I believe that she would be a good candidate.  Baxter plan to discuss at next visit.     Return in about 4 weeks (around 07/22/2024) for Chronic Followup.   Nyia Tsao T Clorine Swing, PA-C

## 2024-06-24 NOTE — Telephone Encounter (Signed)
 Left message on My Chart with Echo results per Dr. Karry note. Routed to PCP.

## 2024-06-24 NOTE — Patient Instructions (Signed)
 It was nice to see you today!  If you have any problems before your next visit feel free to message me via MyChart (minor issues or questions) or call the office, otherwise you may reach out to schedule an office visit.  Thank you! Pau Banh, PA-C

## 2024-06-25 ENCOUNTER — Ambulatory Visit (HOSPITAL_BASED_OUTPATIENT_CLINIC_OR_DEPARTMENT_OTHER): Payer: Self-pay | Admitting: Student

## 2024-06-25 ENCOUNTER — Encounter (HOSPITAL_BASED_OUTPATIENT_CLINIC_OR_DEPARTMENT_OTHER): Payer: Self-pay

## 2024-06-25 LAB — FERRITIN: Ferritin: 69 ng/mL (ref 15–150)

## 2024-06-25 LAB — IRON AND TIBC
Iron Saturation: 18 % (ref 15–55)
Iron: 66 ug/dL (ref 27–139)
Total Iron Binding Capacity: 376 ug/dL (ref 250–450)
UIBC: 310 ug/dL (ref 118–369)

## 2024-06-25 LAB — MAGNESIUM: Magnesium: 2.1 mg/dL (ref 1.6–2.3)

## 2024-06-25 NOTE — Telephone Encounter (Unsigned)
 Copied from CRM [id number]. Topic: Clinical - Medication Question >> Jun 25, 2024  3:19 PM Lauren C wrote: Reason for CRM: Pt says that her previous provider will no longer refill her ALPRAZolam  (XANAX ) 0.5 MG tablet and is needing [facility] to continue with her refills. She says this was discussed at her apt from 9/3. Preferred pharmacy on file [facility] in [place] confirmed correct.

## 2024-06-26 ENCOUNTER — Other Ambulatory Visit (HOSPITAL_BASED_OUTPATIENT_CLINIC_OR_DEPARTMENT_OTHER): Payer: Self-pay | Admitting: Student

## 2024-06-26 ENCOUNTER — Other Ambulatory Visit: Payer: Self-pay | Admitting: Cardiology

## 2024-06-26 ENCOUNTER — Encounter (HOSPITAL_BASED_OUTPATIENT_CLINIC_OR_DEPARTMENT_OTHER): Payer: Self-pay

## 2024-06-26 DIAGNOSIS — F411 Generalized anxiety disorder: Secondary | ICD-10-CM

## 2024-06-26 MED ORDER — ALPRAZOLAM 0.5 MG PO TABS: 0.5000 mg | ORAL_TABLET | Freq: Three times a day (TID) | ORAL | 0 refills | Status: DC | PRN

## 2024-06-29 ENCOUNTER — Ambulatory Visit (INDEPENDENT_AMBULATORY_CARE_PROVIDER_SITE_OTHER): Admitting: Podiatry

## 2024-06-29 ENCOUNTER — Encounter: Payer: Self-pay | Admitting: Podiatry

## 2024-06-29 DIAGNOSIS — E1151 Type 2 diabetes mellitus with diabetic peripheral angiopathy without gangrene: Secondary | ICD-10-CM

## 2024-06-29 DIAGNOSIS — M79674 Pain in right toe(s): Secondary | ICD-10-CM

## 2024-06-29 DIAGNOSIS — B351 Tinea unguium: Secondary | ICD-10-CM | POA: Diagnosis not present

## 2024-06-29 DIAGNOSIS — M79675 Pain in left toe(s): Secondary | ICD-10-CM

## 2024-06-29 NOTE — Progress Notes (Unsigned)
  Subjective:  Patient ID: Shannon Baxter, female    DOB: Aug 03, 1961,  MRN: [id number]  Chief Complaint  Patient presents with   [facility]    DFC, no callous.  A1c 6.3 in July Plavix and Warfirin    63 y.o. female presents with the above complaint. History confirmed with patient. Patient presenting with pain related to dystrophic thickened elongated nails. Patient is unable to trim own nails related to nail dystrophy and mobility issues. Patient does  have a history of T2DM.  Last A1c 6.3.  No significant painful calluses today.  She does ambulate with a walker.  She is on Plavix and warfarin.  Objective:  Physical Exam: warm to cool, good capillary refill, pedal hair growth absent, pedal skin atrophic nail exam onychomycosis of the toenails, onycholysis, and dystrophic nails DP pulses faintly palpable, PT pulses faintly palpable, and protective sensation intact Left Foot:  Pain with palpation of nails due to elongation and dystrophic growth.  Right Foot: Pain with palpation of nails due to elongation and dystrophic growth.    Ambulates with walker assistance.  Assessment:   1. Type II diabetes mellitus with peripheral circulatory disorder (HCC)   2. Pain due to onychomycosis of toenails of both feet      Plan:  Patient was evaluated and treated and all questions answered.  #Onychomycosis with pain  -Nails palliatively debrided as below. -Educated on self-care  Procedure: Nail Debridement Rationale: Pain Type of Debridement: manual, sharp debridement. Instrumentation: Nail nipper, rotary burr. Number of Nails: 10  Return in about 3 months (around 09/28/2024) for Diabetic Foot Care.         Ethan Saddler, DPM [facility] / [facility]

## 2024-06-30 ENCOUNTER — Telehealth (HOSPITAL_BASED_OUTPATIENT_CLINIC_OR_DEPARTMENT_OTHER): Payer: Self-pay | Admitting: Student

## 2024-06-30 DIAGNOSIS — J4520 Mild intermittent asthma, uncomplicated: Secondary | ICD-10-CM | POA: Diagnosis not present

## 2024-06-30 DIAGNOSIS — E1140 Type 2 diabetes mellitus with diabetic neuropathy, unspecified: Secondary | ICD-10-CM | POA: Diagnosis not present

## 2024-06-30 DIAGNOSIS — R2689 Other abnormalities of gait and mobility: Secondary | ICD-10-CM | POA: Diagnosis not present

## 2024-06-30 NOTE — Telephone Encounter (Signed)
 Patient dropped off document Exemption for Mail Delivery, to be filled out by provider. Patient requested to send it back via Call Patient to pick up within 5-days. Document is located in providers tray at front office.Please advise at [facility] [phone]

## 2024-07-01 ENCOUNTER — Encounter (HOSPITAL_BASED_OUTPATIENT_CLINIC_OR_DEPARTMENT_OTHER): Payer: Self-pay

## 2024-07-01 NOTE — Telephone Encounter (Signed)
 Left voicemail for patient to pick up forms.

## 2024-07-02 ENCOUNTER — Other Ambulatory Visit (HOSPITAL_BASED_OUTPATIENT_CLINIC_OR_DEPARTMENT_OTHER): Payer: Self-pay | Admitting: Student

## 2024-07-02 ENCOUNTER — Ambulatory Visit (INDEPENDENT_AMBULATORY_CARE_PROVIDER_SITE_OTHER): Payer: Self-pay

## 2024-07-02 DIAGNOSIS — I420 Dilated cardiomyopathy: Secondary | ICD-10-CM | POA: Diagnosis not present

## 2024-07-02 DIAGNOSIS — Z7901 Long term (current) use of anticoagulants: Secondary | ICD-10-CM

## 2024-07-02 NOTE — Telephone Encounter (Unsigned)
 Copied from CRM #[id number]. Topic: Clinical - Home Health Verbal Orders >> Jul 02, 2024 11:39 AM Leonette SQUIBB wrote: Pt's nurse avocate Shevaun called to say an enrollment form MD INRr was being faxed to the office to have labs done at her home.  They just wanted to give a heads up.

## 2024-07-03 LAB — CUP PACEART REMOTE DEVICE CHECK
Battery Remaining Longevity: 28 mo
Battery Voltage: 2.94 V
Brady Statistic AP VP Percent: 0.02 %
Brady Statistic AP VS Percent: 4.79 %
Brady Statistic AS VP Percent: 0.07 %
Brady Statistic AS VS Percent: 95.12 %
Brady Statistic RA Percent Paced: 4.7 %
Brady Statistic RV Percent Paced: 0.08 %
Date Time Interrogation Session: 20250911233654
HighPow Impedance: 80 Ohm
Implantable Lead Connection Status: 753985
Implantable Lead Connection Status: 753985
Implantable Lead Implant Date: 20180102
Implantable Lead Implant Date: 20180102
Implantable Lead Location: 753859
Implantable Lead Location: 753860
Implantable Lead Model: 5076
Implantable Pulse Generator Implant Date: 20180102
Lead Channel Impedance Value: 342 Ohm
Lead Channel Impedance Value: 437 Ohm
Lead Channel Impedance Value: 494 Ohm
Lead Channel Pacing Threshold Amplitude: 0.5 V
Lead Channel Pacing Threshold Amplitude: 1 V
Lead Channel Pacing Threshold Pulse Width: 0.4 ms
Lead Channel Pacing Threshold Pulse Width: 0.4 ms
Lead Channel Sensing Intrinsic Amplitude: 2 mV
Lead Channel Sensing Intrinsic Amplitude: 2 mV
Lead Channel Sensing Intrinsic Amplitude: 24.5 mV
Lead Channel Sensing Intrinsic Amplitude: 24.5 mV
Lead Channel Setting Pacing Amplitude: 1.5 V
Lead Channel Setting Pacing Amplitude: 2.25 V
Lead Channel Setting Pacing Pulse Width: 0.4 ms
Lead Channel Setting Sensing Sensitivity: 0.3 mV
Zone Setting Status: 755011
Zone Setting Status: 755011

## 2024-07-07 ENCOUNTER — Ambulatory Visit: Payer: Self-pay | Admitting: Cardiology

## 2024-07-08 ENCOUNTER — Telehealth (HOSPITAL_BASED_OUTPATIENT_CLINIC_OR_DEPARTMENT_OTHER): Payer: Self-pay

## 2024-07-08 NOTE — Telephone Encounter (Signed)
 Advised pt that [facility] does not do INR/warfarin management. She is okay with getting referral to warfarin clinic.

## 2024-07-09 NOTE — Telephone Encounter (Signed)
 Copied from CRM [id number]. Topic: General - Other >> Jul 08, 2024  4:22 PM Jasmin G wrote: Reason for CRM: Staff called to find out what is the name of the clinic where pt gets weekly warfarin checks, please call back at  [phone] to let them know, it's okay to leave a voicemail.

## 2024-07-09 NOTE — Progress Notes (Signed)
Remote ICD Transmission.

## 2024-07-12 ENCOUNTER — Other Ambulatory Visit: Payer: Self-pay

## 2024-07-12 DIAGNOSIS — I4892 Unspecified atrial flutter: Secondary | ICD-10-CM

## 2024-07-12 DIAGNOSIS — Z952 Presence of prosthetic heart valve: Secondary | ICD-10-CM

## 2024-07-12 DIAGNOSIS — I480 Paroxysmal atrial fibrillation: Secondary | ICD-10-CM

## 2024-07-14 ENCOUNTER — Ambulatory Visit: Attending: Cardiology

## 2024-07-14 DIAGNOSIS — I4891 Unspecified atrial fibrillation: Secondary | ICD-10-CM | POA: Insufficient documentation

## 2024-07-14 DIAGNOSIS — I824Y9 Acute embolism and thrombosis of unspecified deep veins of unspecified proximal lower extremity: Secondary | ICD-10-CM | POA: Insufficient documentation

## 2024-07-14 DIAGNOSIS — Z7901 Long term (current) use of anticoagulants: Secondary | ICD-10-CM | POA: Insufficient documentation

## 2024-07-14 LAB — POCT INR: INR: 2.4 (ref 2.0–3.0)

## 2024-07-14 NOTE — Patient Instructions (Signed)
 Continue taking 1 tablet daily.  INR in 3 weeks.  [phone]  A full discussion of the nature of anticoagulants has been carried out.  A benefit risk analysis has been presented to the patient, so that they understand the justification for choosing anticoagulation at this time. The need for frequent and regular monitoring, precise dosage adjustment and compliance is stressed.  Side effects of potential bleeding are discussed.  The patient should avoid any OTC items containing aspirin  or ibuprofen, and should avoid great swings in general diet.  Avoid alcohol consumption.  Call if any signs of abnormal bleeding.

## 2024-07-15 ENCOUNTER — Other Ambulatory Visit: Payer: Self-pay | Admitting: Cardiology

## 2024-07-20 ENCOUNTER — Telehealth: Payer: Self-pay | Admitting: Cardiology

## 2024-07-20 NOTE — Telephone Encounter (Signed)
 Espiridion, Nurse Advocate with [facility], says there was a form sent to the office on 9/26 to be completed by Dr. Bernie. She says it was for some type of equipment or meter for the patient to use. Espiridion says she doesn't know much about it but wanted to make sure it was received by Dr. Bernie. Espiridion says patient was at the office Friday and advised that paperwork had been received but MD was out of office. Please provide updates.

## 2024-07-21 ENCOUNTER — Encounter

## 2024-07-21 DIAGNOSIS — R2689 Other abnormalities of gait and mobility: Secondary | ICD-10-CM | POA: Diagnosis not present

## 2024-07-21 DIAGNOSIS — J4520 Mild intermittent asthma, uncomplicated: Secondary | ICD-10-CM | POA: Diagnosis not present

## 2024-07-21 DIAGNOSIS — E1140 Type 2 diabetes mellitus with diabetic neuropathy, unspecified: Secondary | ICD-10-CM | POA: Diagnosis not present

## 2024-07-22 ENCOUNTER — Encounter (HOSPITAL_BASED_OUTPATIENT_CLINIC_OR_DEPARTMENT_OTHER): Payer: Self-pay | Admitting: Student

## 2024-07-22 ENCOUNTER — Ambulatory Visit (INDEPENDENT_AMBULATORY_CARE_PROVIDER_SITE_OTHER): Admitting: Student

## 2024-07-22 VITALS — BP 127/78 | HR 71 | Temp 97.7°F | Resp 16 | Ht 72.0 in | Wt 320.5 lb

## 2024-07-22 DIAGNOSIS — J410 Simple chronic bronchitis: Secondary | ICD-10-CM | POA: Diagnosis not present

## 2024-07-22 DIAGNOSIS — F1320 Sedative, hypnotic or anxiolytic dependence, uncomplicated: Secondary | ICD-10-CM | POA: Insufficient documentation

## 2024-07-22 DIAGNOSIS — E785 Hyperlipidemia, unspecified: Secondary | ICD-10-CM

## 2024-07-22 DIAGNOSIS — E1151 Type 2 diabetes mellitus with diabetic peripheral angiopathy without gangrene: Secondary | ICD-10-CM | POA: Diagnosis not present

## 2024-07-22 DIAGNOSIS — J4540 Moderate persistent asthma, uncomplicated: Secondary | ICD-10-CM | POA: Diagnosis not present

## 2024-07-22 DIAGNOSIS — G2581 Restless legs syndrome: Secondary | ICD-10-CM | POA: Diagnosis not present

## 2024-07-22 DIAGNOSIS — I10 Essential (primary) hypertension: Secondary | ICD-10-CM

## 2024-07-22 DIAGNOSIS — Z6841 Body Mass Index (BMI) 40.0 and over, adult: Secondary | ICD-10-CM

## 2024-07-22 NOTE — Patient Instructions (Addendum)
 It was nice to see you today!  Try to add an extra tablet of gabapentin at night time for a total of 600 mg.  If you have any problems before your next visit feel free to message me via MyChart (minor issues or questions) or call the office, otherwise you may reach out to schedule an office visit.  Thank you! Eldo Umanzor, PA-C

## 2024-07-22 NOTE — Progress Notes (Addendum)
 Established Patient Office Visit  Subjective   Patient ID: Shannon Baxter, female    DOB: 12/25/1960  Age: 63 y.o. MRN: [id number]  Chief Complaint  Patient presents with   Medical Management of Chronic Issues    follow up     HPI  Discussed the use of AI scribe software for clinical note transcription with the patient, who gave verbal consent to proceed.  History of Present Illness   Shannon Baxter is a 63 year old female with diabetes and chronic bronchitis who presents for a follow-up visit.  Her diabetes is managed with glimepiride and metformin, with metformin at a dose of 500 mg per day. Her last A1c was 6.3. She experiences episodes of hypoglycemia, with blood sugar levels dropping to 50-60 mg/dL around noon, causing her to feel sweaty and unwell. She takes glimepiride in the morning with food.  She has a history of restless leg syndrome, which she describes as 'about the same'. She takes gabapentin three times per day, but feels it is not very effective. She also takes Xanax  throughout the day and at night, which she feels helps her stay calm, especially with her five grandchildren around.  She has chronic bronchitis, which she prefers to refer to as asthma and bronchitis. She is scheduled to see a pulmonologist tomorrow. She has not been diagnosed with COPD.  She attends a warfarin clinic every three weeks for her anticoagulation management, which she reports is going well.  She mentions a sore area on her leg, which she attributes to a fall. A physical therapist suggested it might be muscle soreness. She describes the area as sometimes hard and sometimes soft, and it can feel like 'stabbing'. She reports a history of falls, sometimes occurring when she is walking out of her back door.  Her weight has been stable, with a slight increase of about two pounds. She has a history of vascular issues, including stents in her legs, and has been told she has arthritis in her legs.       Patient Active Problem List   Diagnosis Date Noted   Simple chronic bronchitis (HCC) 07/22/2024   Atrial fibrillation (HCC) 07/14/2024   Acute venous embolism and thrombosis of deep vessels of proximal lower extremity (HCC) 07/14/2024   Long term (current) use of anticoagulants 07/14/2024   Restless leg 06/24/2024   Chronic hypoxemic respiratory failure (HCC) 06/24/2024   Chronic anticoagulation 06/24/2024   CHF (congestive heart failure) (HCC)    Stroke (HCC)    OSA on CPAP    PVD (peripheral vascular disease)    Nutritional and metabolic cardiomyopathy (HCC)    Hypertension    Hyperlipidemia    Heart murmur    GERD (gastroesophageal reflux disease)    Diabetes mellitus without complication (HCC)    Claudication    Congestive heart failure (CHF) (HCC)    Asthma    Arthritis    Aortic stenosis    Aortic insufficiency    Allergy    Coronary artery disease    Paroxysmal atrial flutter (HCC) 09/01/2018   Chronic systolic congestive heart failure (HCC) 12/06/2016   Morbid obesity (HCC) 12/06/2016   Presence of automatic implantable cardioverter-defibrillator 10/23/2016   Dilated cardiomyopathy (HCC) 09/03/2016   History of mechanical aortic valve replacement 03/29/2016   Paroxysmal atrial fibrillation (HCC) 03/06/2016   Rheumatic aortic valve insufficiency 03/05/2016   Hypothyroidism 01/23/2016   Type 2 diabetes mellitus with diabetic peripheral angiopathy without gangrene, without long-term current use of insulin (HCC)  11/22/2015   Dyspnea on exertion 10/26/2015   Atherosclerosis of native arteries of extremities with intermittent claudication, bilateral legs 08/04/2015   Carotid atherosclerosis 07/26/2015   Coronary artery disease involving native coronary artery of native heart without angina pectoris 07/26/2015   Dyslipidemia 07/26/2015   Essential hypertension 07/26/2015   Intermittent claudication 07/26/2015   Peripheral vascular disease 07/26/2015   History of  tobacco abuse 07/26/2015   Past Medical History:  Diagnosis Date   Acute on chronic systolic heart failure (HCC) 02/21/2016   Allergy    Aortic insufficiency    Aortic stenosis    Arthritis    Asthma    Atherosclerosis of native arteries of extremities with intermittent claudication, bilateral legs 08/04/2015   Carotid atherosclerosis 07/26/2015   CHF (congestive heart failure) (HCC)    Chronic systolic congestive heart failure (HCC) 12/06/2016   Claudication    Congestive heart failure (CHF) (HCC)    Coronary artery disease    Coronary artery disease involving native coronary artery of native heart without angina pectoris 07/26/2015   Diabetes mellitus without complication (HCC)    Dilated cardiomyopathy (HCC) 09/03/2016   Overview:  Ejection fraction 20-25%   Dyslipidemia 07/26/2015   Dyspnea on exertion 10/26/2015   Essential hypertension 07/26/2015   GERD (gastroesophageal reflux disease)    Heart murmur    History of mechanical aortic valve replacement 03/29/2016   Overview:  May 2017. [facility] 23 mm  Formatting of this note might be different from the original. Overview:  May 2017. [facility] 23 mm Formatting of this note might be different from the original. May 2017. [facility] 23 mm   History of tobacco abuse 07/26/2015   Hyperlipidemia    Hypertension    Hypothyroidism 01/23/2016   Intermittent claudication 07/26/2015   Morbid obesity (HCC) 12/06/2016   Nutritional and metabolic cardiomyopathy (HCC)    Paroxysmal atrial fibrillation (HCC) 03/06/2016   Paroxysmal atrial flutter (HCC) 09/01/2018   Peripheral vascular disease 07/26/2015   Presence of automatic implantable cardioverter-defibrillator 10/23/2016   PVD (peripheral vascular disease)    Rheumatic aortic valve insufficiency 03/05/2016   Sleep apnea    Stroke (HCC)    Type 2 diabetes mellitus with diabetic peripheral angiopathy without gangrene, without long-term current use of insulin (HCC) 11/22/2015   Past  Surgical History:  Procedure Laterality Date   A-FLUTTER ABLATION N/A 11/05/2018   Procedure: A-FLUTTER ABLATION;  Surgeon: Inocencio Soyla Lunger, MD;  Location: [facility];  Service: Cardiovascular;  Laterality: N/A;   CARDIAC CATHETERIZATION     INSERT / REPLACE / REMOVE PACEMAKER     [employer] ICD   MECHANICAL AORTIC VALVE REPLACEMENT     PERIPHERAL ARTERIAL STENT GRAFT     TUBAL LIGATION     Social History   Tobacco Use   Smoking status: Former    Current packs/day: 0.00    Average packs/day: 0.3 packs/day for 20.0 years (5.0 ttl pk-yrs)    Types: Cigarettes    Start date: 65    Quit date: 2014    Years since quitting: 11.7    Passive exposure: Past   Smokeless tobacco: Never  Vaping Use   Vaping status: Never Used  Substance Use Topics   Alcohol use: Not Currently    Comment: quit 2010   Drug use: Not Currently    Types: Crack cocaine   Allergies  Allergen Reactions   Iodinated Contrast Media Hives and Rash   Ioxaglate Hives  ROS Per HPI.    Objective:     BP 127/78   Pulse 71   Temp 97.7 F (36.5 C) (Oral)   Resp 16   Ht 6' (1.829 m)   Wt (!) 320 lb 8 oz (145.4 kg)   SpO2 96%   BMI 43.47 kg/m  BP Readings from Last 3 Encounters:  07/22/24 127/78  06/24/24 115/72  06/15/24 107/86   Wt Readings from Last 3 Encounters:  07/22/24 (!) 320 lb 8 oz (145.4 kg)  06/24/24 (!) 318 lb 1.6 oz (144.3 kg)  06/15/24 (!) 323 lb 9.6 oz (146.8 kg)      Physical Exam Constitutional:      General: She is not in acute distress.    Appearance: Normal appearance. She is obese. She is not ill-appearing.  HENT:     Head: Normocephalic and atraumatic.     Nose: Nose normal.  Eyes:     General: No scleral icterus.    Conjunctiva/sclera: Conjunctivae normal.  Cardiovascular:     Rate and Rhythm: Normal rate and regular rhythm.     Heart sounds: Normal heart sounds. No murmur heard.    No friction rub.  Pulmonary:     Effort: Pulmonary effort is  normal. No respiratory distress.     Breath sounds: Normal breath sounds. No wheezing, rhonchi or rales.  Musculoskeletal:        General: Normal range of motion.  Skin:    General: Skin is warm and dry.     Coloration: Skin is not jaundiced or pale.  Neurological:     General: No focal deficit present.     Mental Status: She is alert.  Psychiatric:        Mood and Affect: Mood normal.        Behavior: Behavior normal.      No results found for any visits on 07/22/24.  Last CBC Lab Results  Component Value Date   WBC 6.1 05/25/2024   HGB 12.3 05/25/2024   HCT 40.3 05/25/2024   MCV 91 05/25/2024   MCH 27.8 05/25/2024   RDW 14.3 05/25/2024   PLT 210 05/25/2024   Last metabolic panel Lab Results  Component Value Date   GLUCOSE 91 05/25/2024   NA 139 05/25/2024   K 4.7 05/25/2024   CL 103 05/25/2024   CO2 23 05/25/2024   BUN 24 05/25/2024   CREATININE 1.06 (H) 05/25/2024   EGFR 59 (L) 05/25/2024   CALCIUM  9.0 05/25/2024   PROT 6.7 05/25/2024   ALBUMIN 3.8 (L) 05/25/2024   LABGLOB 2.9 05/25/2024   AGRATIO 1.1 (L) 12/27/2022   BILITOT 0.3 05/25/2024   ALKPHOS 58 05/25/2024   AST 13 05/25/2024   ALT 14 05/25/2024   Last lipids Lab Results  Component Value Date   CHOL 162 05/25/2024   HDL 44 05/25/2024   LDLCALC 100 (H) 05/25/2024   TRIG 98 05/25/2024   CHOLHDL 3.7 05/25/2024   Last hemoglobin A1c Lab Results  Component Value Date   HGBA1C  07/04/2007    6.1 (NOTE)   The ADA recommends the following therapeutic goals for glycemic   control related to Hgb A1C measurement:   Goal of Therapy:   < 7.0% Hgb A1C   Action Suggested:  > 8.0% Hgb A1C   Ref:  Diabetes Care, 22, Suppl. 1, 1999      The ASCVD Risk score (Arnett DK, et al., 2019) failed to calculate for the following reasons:   Risk score  cannot be calculated because patient has a medical history suggesting prior/existing ASCVD    Assessment & Plan:   Assessment and Plan    Type 2 diabetes  mellitus Well-controlled with A1c of 6.3. Notable hypoglycemic episode around noon with blood sugar 50-60 mg/dL. - Recheck A1c. - Monitor blood glucose levels, especially around noon. - Consider reducing glimepiride dosage if hypoglycemia persists. - Evaluate starting a GLP-1 receptor agonist like Ozempic or Mounjaro for weight management and cardiovascular benefits.  Hypertension Chronic, stable. - Continue current regimen.  Obesity, BMI 40.0-44.9 Potential plan to introduce a GLP-1 receptor agonist for weight loss and cardiovascular health. - Consider starting a GLP-1 receptor agonist for weight management and cardiovascular benefits.  Restless legs syndrome Chronic, not at goal with med management. Persistent symptoms with inadequate relief from current gabapentin regimen of 300 mg three times daily. - Adjust gabapentin dosing to 300 mg in the morning, 300 mg in the afternoon, and 600 mg at night. - Evaluate effectiveness of new gabapentin dosing schedule.  Benzodiazepine dependence, long-term use Long-term Xanax  use with plan to taper dosage to reduce fall risk and cognitive decline. - Continue current Xanax  dosage for this month. - Plan to reduce Xanax  by one tablet per day next month.  Chronic bronchitis Chronic, stable. Under care of pulmonologist Dr. Marney with upcoming appointment. - Continue management with pulmonologist Dr. Marney. - Continue current regimen.     Dyslipidemia - Reassess lipids today  Return in about 3 months (around 10/22/2024) for DM.    Emmarae Cowdery T Victorina Kable, PA-C

## 2024-07-22 NOTE — Telephone Encounter (Signed)
 Shannon Baxter is calling to check status of this message

## 2024-07-23 ENCOUNTER — Telehealth: Payer: Self-pay

## 2024-07-23 ENCOUNTER — Encounter (HOSPITAL_BASED_OUTPATIENT_CLINIC_OR_DEPARTMENT_OTHER): Payer: Self-pay

## 2024-07-23 DIAGNOSIS — J301 Allergic rhinitis due to pollen: Secondary | ICD-10-CM | POA: Diagnosis not present

## 2024-07-23 DIAGNOSIS — J4540 Moderate persistent asthma, uncomplicated: Secondary | ICD-10-CM | POA: Diagnosis not present

## 2024-07-23 DIAGNOSIS — G4733 Obstructive sleep apnea (adult) (pediatric): Secondary | ICD-10-CM | POA: Diagnosis not present

## 2024-07-23 LAB — CBC WITH DIFFERENTIAL/PLATELET
Basophils Absolute: 0.1 x10E3/uL (ref 0.0–0.2)
Basos: 1 %
EOS (ABSOLUTE): 0.1 x10E3/uL (ref 0.0–0.4)
Eos: 2 %
Hematocrit: 40.9 % (ref 34.0–46.6)
Hemoglobin: 12.7 g/dL (ref 11.1–15.9)
Immature Grans (Abs): 0 x10E3/uL (ref 0.0–0.1)
Immature Granulocytes: 0 %
Lymphocytes Absolute: 1.9 x10E3/uL (ref 0.7–3.1)
Lymphs: 31 %
MCH: 27.6 pg (ref 26.6–33.0)
MCHC: 31.1 g/dL — ABNORMAL LOW (ref 31.5–35.7)
MCV: 89 fL (ref 79–97)
Monocytes Absolute: 0.6 x10E3/uL (ref 0.1–0.9)
Monocytes: 11 %
Neutrophils Absolute: 3.3 x10E3/uL (ref 1.4–7.0)
Neutrophils: 55 %
Platelets: 215 x10E3/uL (ref 150–450)
RBC: 4.6 x10E6/uL (ref 3.77–5.28)
RDW: 14.8 % (ref 11.7–15.4)
WBC: 6 x10E3/uL (ref 3.4–10.8)

## 2024-07-23 LAB — HEMOGLOBIN A1C
Est. average glucose Bld gHb Est-mCnc: 117 mg/dL
Hgb A1c MFr Bld: 5.7 % — ABNORMAL HIGH (ref 4.8–5.6)

## 2024-07-23 LAB — COMPREHENSIVE METABOLIC PANEL WITH GFR
ALT: 14 IU/L (ref 0–32)
AST: 21 IU/L (ref 0–40)
Albumin: 4 g/dL (ref 3.9–4.9)
Alkaline Phosphatase: 62 IU/L (ref 49–135)
BUN/Creatinine Ratio: 20 (ref 12–28)
BUN: 20 mg/dL (ref 8–27)
Bilirubin Total: 0.4 mg/dL (ref 0.0–1.2)
CO2: 21 mmol/L (ref 20–29)
Calcium: 9.4 mg/dL (ref 8.7–10.3)
Chloride: 104 mmol/L (ref 96–106)
Creatinine, Ser: 0.99 mg/dL (ref 0.57–1.00)
Globulin, Total: 3 g/dL (ref 1.5–4.5)
Glucose: 91 mg/dL (ref 70–99)
Potassium: 4.5 mmol/L (ref 3.5–5.2)
Sodium: 141 mmol/L (ref 134–144)
Total Protein: 7 g/dL (ref 6.0–8.5)
eGFR: 64 mL/min/1.73 (ref >59)

## 2024-07-23 LAB — LIPID PANEL
Chol/HDL Ratio: 3.4 ratio (ref 0.0–4.4)
Cholesterol, Total: 156 mg/dL (ref 100–199)
HDL: 46 mg/dL (ref >39)
LDL Chol Calc (NIH): 93 mg/dL (ref 0–99)
Triglycerides: 89 mg/dL (ref 0–149)
VLDL Cholesterol Cal: 17 mg/dL (ref 5–40)

## 2024-07-23 LAB — MICROALBUMIN / CREATININE URINE RATIO
Creatinine, Urine: 217.3 mg/dL
Microalb/Creat Ratio: 7 mg/g{creat} (ref 0–29)
Microalbumin, Urine: 16 ug/mL

## 2024-07-23 NOTE — Telephone Encounter (Signed)
 Md/INR fax received and faxed back to number provided.

## 2024-07-24 ENCOUNTER — Encounter (HOSPITAL_BASED_OUTPATIENT_CLINIC_OR_DEPARTMENT_OTHER): Payer: Self-pay | Admitting: Student

## 2024-07-24 ENCOUNTER — Ambulatory Visit (HOSPITAL_BASED_OUTPATIENT_CLINIC_OR_DEPARTMENT_OTHER): Payer: Self-pay | Admitting: Student

## 2024-07-24 DIAGNOSIS — Z6841 Body Mass Index (BMI) 40.0 and over, adult: Secondary | ICD-10-CM

## 2024-07-24 DIAGNOSIS — E1151 Type 2 diabetes mellitus with diabetic peripheral angiopathy without gangrene: Secondary | ICD-10-CM

## 2024-07-25 ENCOUNTER — Other Ambulatory Visit (HOSPITAL_BASED_OUTPATIENT_CLINIC_OR_DEPARTMENT_OTHER): Payer: Self-pay | Admitting: Student

## 2024-07-25 DIAGNOSIS — F411 Generalized anxiety disorder: Secondary | ICD-10-CM

## 2024-07-28 ENCOUNTER — Encounter (HOSPITAL_BASED_OUTPATIENT_CLINIC_OR_DEPARTMENT_OTHER): Payer: Self-pay

## 2024-07-30 MED ORDER — TIRZEPATIDE 2.5 MG/0.5ML ~~LOC~~ SOAJ: 2.5000 mg | SUBCUTANEOUS | 0 refills | Status: DC

## 2024-08-04 ENCOUNTER — Ambulatory Visit: Attending: Cardiology

## 2024-08-04 DIAGNOSIS — Z7901 Long term (current) use of anticoagulants: Secondary | ICD-10-CM | POA: Diagnosis not present

## 2024-08-04 DIAGNOSIS — Z952 Presence of prosthetic heart valve: Secondary | ICD-10-CM

## 2024-08-04 DIAGNOSIS — I4892 Unspecified atrial flutter: Secondary | ICD-10-CM

## 2024-08-04 DIAGNOSIS — I480 Paroxysmal atrial fibrillation: Secondary | ICD-10-CM | POA: Diagnosis not present

## 2024-08-04 LAB — POCT INR: INR: 2.1 (ref 2.0–3.0)

## 2024-08-04 NOTE — Patient Instructions (Signed)
 Continue taking 1 tablet daily.  INR in 5 weeks.  [phone]

## 2024-08-06 ENCOUNTER — Telehealth (HOSPITAL_BASED_OUTPATIENT_CLINIC_OR_DEPARTMENT_OTHER): Payer: Self-pay

## 2024-08-06 DIAGNOSIS — I480 Paroxysmal atrial fibrillation: Secondary | ICD-10-CM | POA: Diagnosis not present

## 2024-08-06 DIAGNOSIS — Z86718 Personal history of other venous thrombosis and embolism: Secondary | ICD-10-CM | POA: Diagnosis not present

## 2024-08-06 DIAGNOSIS — Z7901 Long term (current) use of anticoagulants: Secondary | ICD-10-CM | POA: Diagnosis not present

## 2024-08-06 NOTE — Telephone Encounter (Signed)
 As per labs dated 07/22/2024, advised to stop glimepiride   Copied from CRM [id number]. Topic: Clinical - Medication Question >> Aug 06, 2024  8:19 AM Donna BRAVO wrote: Reason for CRM: patient was told to stop taking a pill while on the tirzepatide (MOUNJARO) 2.5 MG/0.5ML Pen. She has forgotten what pill the name of the pill is.  Please call patient back with this information. Okay to leave a detailed message.   Patient was informed they will receive a call back within 1 business day.

## 2024-08-09 DIAGNOSIS — M25561 Pain in right knee: Secondary | ICD-10-CM | POA: Diagnosis not present

## 2024-08-12 ENCOUNTER — Other Ambulatory Visit (HOSPITAL_BASED_OUTPATIENT_CLINIC_OR_DEPARTMENT_OTHER): Payer: Self-pay | Admitting: Student

## 2024-08-12 ENCOUNTER — Telehealth (HOSPITAL_BASED_OUTPATIENT_CLINIC_OR_DEPARTMENT_OTHER): Payer: Self-pay | Admitting: Student

## 2024-08-12 DIAGNOSIS — E1151 Type 2 diabetes mellitus with diabetic peripheral angiopathy without gangrene: Secondary | ICD-10-CM

## 2024-08-12 MED ORDER — GABAPENTIN 300 MG PO CAPS: 300.0000 mg | ORAL_CAPSULE | Freq: Three times a day (TID) | ORAL | 5 refills | Status: AC

## 2024-08-12 NOTE — Telephone Encounter (Unsigned)
 Copied from CRM [id number]. Topic: Clinical - Medication Refill >> Aug 12, 2024  9:53 AM Donee H wrote: Medication: gabapentin (NEURONTIN) 300 MG capsule  Has the patient contacted their pharmacy? No  This is the patient's preferred pharmacy:  [facility] [address], [place] - [address] [place] [address] Phone: [phone] Fax: [phone]  Is this the correct pharmacy for this prescription? Yes  Has the prescription been filled recently? No  Is the patient out of the medication? No, but patient only has about 3 days left  Has the patient been seen for an appointment in the last year OR does the patient have an upcoming appointment? Yes  Can we respond through MyChart? Yes  Agent: Please be advised that Rx refills may take up to 3 business days. We ask that you follow-up with your pharmacy.

## 2024-08-12 NOTE — Telephone Encounter (Signed)
 Pt advised gabapentin was sent in.

## 2024-08-12 NOTE — Telephone Encounter (Signed)
 4 week follow up scheduled for 08/03/2024 (see last labs). Pt is asking if [facility] would refill gabapentin?

## 2024-08-12 NOTE — Telephone Encounter (Signed)
 Copied from CRM [id number]. Topic: General - Other >> Aug 12, 2024  9:58 AM Donee H wrote: Reason for CRM: Patient states she began taking shots and would like the nurse to give her a call. She was told to make an appointment to follow up on shots but she is unsure when to do so. She states she is not sure if she was to call after second shot or the last one. Please follow up with patient [phone]

## 2024-08-17 DIAGNOSIS — S5002XA Contusion of left elbow, initial encounter: Secondary | ICD-10-CM | POA: Diagnosis not present

## 2024-08-18 DIAGNOSIS — M1712 Unilateral primary osteoarthritis, left knee: Secondary | ICD-10-CM | POA: Diagnosis not present

## 2024-08-20 ENCOUNTER — Other Ambulatory Visit: Payer: Self-pay | Admitting: Cardiology

## 2024-08-22 DIAGNOSIS — J4540 Moderate persistent asthma, uncomplicated: Secondary | ICD-10-CM | POA: Diagnosis not present

## 2024-08-25 ENCOUNTER — Other Ambulatory Visit (HOSPITAL_BASED_OUTPATIENT_CLINIC_OR_DEPARTMENT_OTHER): Payer: Self-pay | Admitting: Student

## 2024-08-25 ENCOUNTER — Ambulatory Visit: Payer: Self-pay

## 2024-08-25 DIAGNOSIS — F411 Generalized anxiety disorder: Secondary | ICD-10-CM

## 2024-08-26 ENCOUNTER — Encounter (HOSPITAL_BASED_OUTPATIENT_CLINIC_OR_DEPARTMENT_OTHER): Payer: Self-pay

## 2024-08-26 ENCOUNTER — Ambulatory Visit (INDEPENDENT_AMBULATORY_CARE_PROVIDER_SITE_OTHER)

## 2024-08-26 VITALS — BP 127/78 | Ht 72.0 in | Wt 315.0 lb

## 2024-08-26 DIAGNOSIS — Z0000 Encounter for general adult medical examination without abnormal findings: Secondary | ICD-10-CM | POA: Diagnosis not present

## 2024-08-26 NOTE — Patient Instructions (Signed)
 Ms. Shannon Baxter,  Thank you for taking the time for your Medicare Wellness Visit. I appreciate your continued commitment to your health goals. Please review the care plan we discussed, and feel free to reach out if I can assist you further.  Please note that Annual Wellness Visits do not include a physical exam. Some assessments may be limited, especially if the visit was conducted virtually. If needed, we may recommend an in-person follow-up with your provider.  Ongoing Care Seeing your primary care provider every 3 to 6 months helps us  monitor your health and provide consistent, personalized care.   Referrals If a referral was made during today's visit and you haven't received any updates within two weeks, please contact the referred provider directly to check on the status.  Recommended Screenings:  Health Maintenance  Topic Date Due   HIV Screening  Never done   Hepatitis C Screening  Never done   Hepatitis B Vaccine (2 of 3 - 19+ 3-dose series) 05/20/2017   Medicare Annual Wellness Visit  11/23/2021   Pap with HPV screening  07/22/2025*   COVID-19 Vaccine (8 - 2025-26 season) 07/22/2025*   Eye exam for diabetics  10/08/2024   Hemoglobin A1C  01/20/2025   Complete foot exam   06/29/2025   Yearly kidney function blood test for diabetes  07/22/2025   Yearly kidney health urinalysis for diabetes  07/22/2025   Cologuard (Stool DNA test)  12/03/2025   Breast Cancer Screening  01/28/2026   DTaP/Tdap/Td vaccine (8 - Td or Tdap) 10/27/2031   Pneumococcal Vaccine for age over 31  Completed   Flu Shot  Completed   Zoster (Shingles) Vaccine  Completed   HPV Vaccine  Aged Out   Meningitis B Vaccine  Aged Out  *Topic was postponed. The date shown is not the original due date.       08/26/2024    3:59 PM  Advanced Directives  Does Patient Have a Medical Advance Directive? Yes  Type of Estate Agent of [place];Living will  Copy of Healthcare Power of Attorney in Chart?  No - copy requested    Vision: Annual vision screenings are recommended for early detection of glaucoma, cataracts, and diabetic retinopathy. These exams can also reveal signs of chronic conditions such as diabetes and high blood pressure.  Dental: Annual dental screenings help detect early signs of oral cancer, gum disease, and other conditions linked to overall health, including heart disease and diabetes.  Please see the attached documents for additional preventive care recommendations.

## 2024-08-26 NOTE — Progress Notes (Signed)
 Visit Complete: Virtual I connected with this patient by a audio enabled telemedicine application and verified that I am speaking with the correct person using two identifiers.  Patient Location: Home Provider Location: Home Office  I discussed the limitations of evaluation and management by telemedicine. The patient expressed understanding and agreed to proceed.  Persons Participating in Visit: Patient:    Because this visit was a virtual/telehealth visit,  certain criteria was not obtained, such a blood pressure, CBG if applicable, and timed get up and go. Any medications not marked as taking were not mentioned during the medication reconciliation part of the visit. Any vitals not documented were not able to be obtained due to this being a telehealth visit or patient was unable to self-report a recent blood pressure reading due to a lack of equipment at home via telehealth. Vitals that have been documented are verbally provided by the patient.   This visit was performed by a medical professional under my direct supervision. I was immediately available for consultation/collaboration. I have reviewed and agree with the Annual Wellness Visit documentation.  Subjective:   Shannon Baxter is a 63 y.o. female who presents for a Medicare Annual Wellness Visit.  Allergies (verified) Iodinated contrast media and Ioxaglate   History: Past Medical History:  Diagnosis Date   Acute on chronic systolic heart failure (HCC) 02/21/2016   Allergy    Aortic insufficiency    Aortic stenosis    Arthritis    Asthma    Atherosclerosis of native arteries of extremities with intermittent claudication, bilateral legs 08/04/2015   Carotid atherosclerosis 07/26/2015   CHF (congestive heart failure) (HCC)    Chronic systolic congestive heart failure (HCC) 12/06/2016   Claudication    Congestive heart failure (CHF) (HCC)    Coronary artery disease    Coronary artery disease involving native coronary artery of  native heart without angina pectoris 07/26/2015   Diabetes mellitus without complication (HCC)    Dilated cardiomyopathy (HCC) 09/03/2016   Overview:  Ejection fraction 20-25%   Dyslipidemia 07/26/2015   Dyspnea on exertion 10/26/2015   Essential hypertension 07/26/2015   GERD (gastroesophageal reflux disease)    Heart murmur    History of mechanical aortic valve replacement 03/29/2016   Overview:  May 2017. [facility] 23 mm  Formatting of this note might be different from the original. Overview:  May 2017. [facility] 23 mm Formatting of this note might be different from the original. May 2017. [facility] 23 mm   History of tobacco abuse 07/26/2015   Hyperlipidemia    Hypertension    Hypothyroidism 01/23/2016   Intermittent claudication 07/26/2015   Morbid obesity (HCC) 12/06/2016   Nutritional and metabolic cardiomyopathy (HCC)    Paroxysmal atrial fibrillation (HCC) 03/06/2016   Paroxysmal atrial flutter (HCC) 09/01/2018   Peripheral vascular disease 07/26/2015   Presence of automatic implantable cardioverter-defibrillator 10/23/2016   PVD (peripheral vascular disease)    Rheumatic aortic valve insufficiency 03/05/2016   Sleep apnea    Stroke (HCC)    Type 2 diabetes mellitus with diabetic peripheral angiopathy without gangrene, without long-term current use of insulin (HCC) 11/22/2015   Past Surgical History:  Procedure Laterality Date   A-FLUTTER ABLATION N/A 11/05/2018   Procedure: A-FLUTTER ABLATION;  Surgeon: Inocencio Soyla Lunger, MD;  Location: [facility];  Service: Cardiovascular;  Laterality: N/A;   CARDIAC CATHETERIZATION     INSERT / REPLACE / REMOVE PACEMAKER     [employer] ICD   MECHANICAL AORTIC VALVE  REPLACEMENT     PERIPHERAL ARTERIAL STENT GRAFT     TUBAL LIGATION     Family History  Problem Relation Age of Onset   Heart Problems Mother    Diabetes Mother        had leg amputation   Heart attack Father    Diabetes Sister    Diabetes Sister     Parkinson's disease Brother    Social History   Occupational History   Not on file  Tobacco Use   Smoking status: Former    Current packs/day: 0.00    Average packs/day: 0.3 packs/day for 20.0 years (5.0 ttl pk-yrs)    Types: Cigarettes    Start date: 26    Quit date: 2014    Years since quitting: 11.8    Passive exposure: Past   Smokeless tobacco: Never  Vaping Use   Vaping status: Never Used  Substance and Sexual Activity   Alcohol use: Not Currently    Comment: quit 2010   Drug use: Not Currently    Types: Crack cocaine   Sexual activity: Not on file   Tobacco Counseling Counseling given: Not Answered  SDOH Screenings   Food Insecurity: No Food Insecurity (08/26/2024)  Housing: Low Risk  (08/26/2024)  Transportation Needs: No Transportation Needs (08/26/2024)  Utilities: Not At Risk (08/26/2024)  Depression (PHQ2-9): Medium Risk (08/26/2024)  Physical Activity: Insufficiently Active (08/26/2024)  Social Connections: Moderately Integrated (08/26/2024)  Stress: No Stress Concern Present (08/26/2024)  Tobacco Use: Medium Risk (08/26/2024)  Health Literacy: Adequate Health Literacy (08/26/2024)   Depression Screen    08/26/2024    4:03 PM 07/22/2024   10:02 AM 06/24/2024    9:59 AM 05/14/2019    5:01 PM  PHQ 2/9 Scores  PHQ - 2 Score 2 2 2  0  PHQ- 9 Score 7 10 14       Goals Addressed             This Visit's Progress    Patient Stated       To get to feeling better       Visit info / Clinical Intake: Medicare Wellness Visit Type:: Initial Annual Wellness Visit Medicare Wellness Visit Mode:: Telephone If telephone:: video declined Interpreter Needed?: No Pre-visit prep was completed: no AWV questionnaire completed by patient prior to visit?: no Living arrangements:: (!) lives alone Patient's Overall Health Status Rating: good Typical amount of pain: (!) a lot Does pain affect daily life?: no Are you currently prescribed opioids?: no  Dietary Habits and  Nutritional Risks How many meals a day?: 2 Most meals are obtained by: preparing own meals; eating out; having others provide food In the last 2 weeks, have you had any of the following?: -- (none) Diabetic:: (!) yes Any non-healing wounds?: no How often do you check your BS?: 2 Would you like to be referred to a Nutritionist or for Diabetic Management? : no  Functional Status Activities of Daily Living (to include ambulation/medication): Independent Ambulation: Independent with device- listed below Home Assistive Devices/Equipment: Johna Finder (specify Type); Nebulizers; Oxygen  Medication Administration: Independent Home Management: Independent Manage your own finances?: yes Primary transportation is: driving Concerns about vision?: no *vision screening is required for WTM* Concerns about hearing?: no  Fall Screening Falls in the past year?: 0 Number of falls in past year: 0 Was there an injury with Fall?: 0 Fall Risk Category Calculator: 0 Patient Fall Risk Level: Low Fall Risk  Fall Risk Patient at Risk for Falls Due to:  No Fall Risks Fall risk Follow up: Falls evaluation completed; Falls prevention discussed  Home and Transportation Safety: All rugs have non-skid backing?: N/A, no rugs All stairs or steps have railings?: yes Grab bars in the bathtub or shower?: yes Have non-skid surface in bathtub or shower?: yes Good home lighting?: yes Regular seat belt use?: yes Hospital stays in the last year:: no  Cognitive Assessment Difficulty concentrating, remembering, or making decisions? : no Will 6CIT or Mini Cog be Completed: no 6CIT or Mini Cog Declined: patient alert, oriented, able to answer questions appropriately and recall recent events  Advance Directives (For Healthcare) Does Patient Have a Medical Advance Directive?: Yes Type of Advance Directive: Healthcare Power of [place]; Living will Copy of Healthcare Power of Attorney in Chart?: No - copy  requested Copy of Living Will in Chart?: No - copy requested  Reviewed/Updated  Reviewed/Updated: All        Objective:    Today's Vitals   08/26/24 1557  BP: 127/78  Weight: (!) 315 lb (142.9 kg)  Height: 6' (1.829 m)   Body mass index is 42.72 kg/m.  Current Medications (verified) Outpatient Encounter Medications as of 08/26/2024  Medication Sig   acetaminophen  (TYLENOL ) 650 MG CR tablet Take 2,600 mg by mouth daily.   albuterol  (PROVENTIL  HFA;VENTOLIN  HFA) 108 (90 Base) MCG/ACT inhaler Inhale 1-2 puffs into the lungs every 6 (six) hours as needed for wheezing or shortness of breath.   albuterol  (PROVENTIL ) (2.5 MG/3ML) 0.083% nebulizer solution Take 2.5 mg by nebulization every 4 (four) hours as needed for shortness of breath or wheezing.   ALPRAZolam  (XANAX ) 0.5 MG tablet TAKE 1 TABLET (0.5 MG TOTAL) BY MOUTH 3 (THREE) TIMES DAILY AS NEEDED FOR ANXIETY.   amiodarone  (PACERONE ) 200 MG tablet TAKE 1/2 TABLET BY MOUTH DAILY   carvedilol  (COREG ) 25 MG tablet TAKE 1 TABLET BY MOUTH TWICE A DAY   clopidogrel (PLAVIX) 75 MG tablet Take 75 mg by mouth daily after breakfast.    Cyanocobalamin (B-12) 2000 MCG TABS Take 2 tablets by mouth daily.   ENTRESTO  49-51 MG TAKE 1 TABLET BY MOUTH TWICE A DAY   EPIPEN 2-PAK 0.3 MG/0.3ML SOAJ injection Inject 0.3 mg into the muscle once.    ezetimibe (ZETIA) 10 MG tablet Take 10 mg by mouth daily.   fluticasone (FLONASE) 50 MCG/ACT nasal spray Place 2 sprays into both nostrils daily as needed for allergies.    furosemide  (LASIX ) 40 MG tablet Take 80 mg by mouth 2 (two) times daily. (Patient taking differently: Take 80 mg by mouth as needed.)   gabapentin (NEURONTIN) 300 MG capsule Take 1 capsule (300 mg total) by mouth 3 (three) times daily.   Galcanezumab -gnlm (EMGALITY ) 120 MG/ML SOAJ Inject 120 mg into the skin every 30 (thirty) days.   hydrALAZINE (APRESOLINE) 25 MG tablet Take 25 mg by mouth 3 (three) times daily.   levothyroxine (SYNTHROID)  100 MCG tablet Take 100 mcg by mouth daily.   linaclotide (LINZESS) 72 MCG capsule Take 72 mcg by mouth daily as needed (constipation).   loratadine (CLARITIN) 10 MG tablet Take 10 mg by mouth at bedtime.    metFORMIN (GLUCOPHAGE) 500 MG tablet Take 500 mg by mouth daily.   montelukast (SINGULAIR) 10 MG tablet Take 10 mg by mouth at bedtime.    Multiple Vitamins-Minerals (CENTRUM SILVER ULTRA WOMENS) TABS Take 1 tablet by mouth daily. Unknown strength   nystatin (MYCOSTATIN/NYSTOP) powder Apply 1 application topically 3 (three) times daily. Unknown strength  omeprazole (PRILOSEC) 20 MG capsule Take 20 mg by mouth at bedtime.    ondansetron  (ZOFRAN -ODT) 4 MG disintegrating tablet Take 4 mg by mouth every 6 (six) hours.   Potassium Chloride  ER 20 MEQ TBCR Take 1 tablet (20 mEq total) by mouth daily.   Red Yeast Rice 600 MG TABS Take 2 tablets by mouth daily.   rosuvastatin  (CRESTOR ) 40 MG tablet Take 1 tablet (40 mg total) by mouth daily.   spironolactone  (ALDACTONE ) 25 MG tablet TAKE 1/2 TABLET BY MOUTH EVERY DAY   tirzepatide (MOUNJARO) 2.5 MG/0.5ML Pen Inject 2.5 mg into the skin once a week. We will plan to increase to the next dose after one month.   TRELEGY ELLIPTA 200-62.5-25 MCG/ACT AEPB Inhale 1 puff into the lungs daily.   Ubrogepant  (UBRELVY ) 50 MG TABS Take 1 tablet (50 mg total) by mouth as needed (migraine). May repeat a dose in 2 hours if needed. Max dose 2 pills in 24 hours   warfarin (COUMADIN) 4 MG tablet Take 4 mg by mouth daily.   warfarin (COUMADIN) 5 MG tablet Take 5 mg by mouth See admin instructions. Take 6 mg on Sunday, Tuesday, and  Thursday . Take 5 mg on all other days   No facility-administered encounter medications on file as of 08/26/2024.   Hearing/Vision screen Hearing Screening - Comments:: No difficulties Vision Screening - Comments:: Wears readers Immunizations and Health Maintenance Health Maintenance  Topic Date Due   HIV Screening  Never done    Hepatitis C Screening  Never done   Hepatitis B Vaccines 19-59 Average Risk (2 of 3 - 19+ 3-dose series) 05/20/2017   Medicare Annual Wellness (AWV)  11/23/2021   Cervical Cancer Screening (HPV/Pap Cotest)  07/22/2025 (Originally 04/06/2019)   COVID-19 Vaccine (8 - 2025-26 season) 07/22/2025 (Originally 06/22/2024)   OPHTHALMOLOGY EXAM  10/08/2024   HEMOGLOBIN A1C  01/20/2025   FOOT EXAM  06/29/2025   Diabetic kidney evaluation - eGFR measurement  07/22/2025   Diabetic kidney evaluation - Urine ACR  07/22/2025   Fecal DNA (Cologuard)  12/03/2025   Mammogram  01/28/2026   DTaP/Tdap/Td (8 - Td or Tdap) 10/27/2031   Pneumococcal Vaccine: 50+ Years  Completed   Influenza Vaccine  Completed   Zoster Vaccines- Shingrix  Completed   HPV VACCINES  Aged Out   Meningococcal B Vaccine  Aged Out        Assessment/Plan:  This is a routine wellness examination for Shannon Baxter.  Patient Care Team: Rothfuss, Jacob T, PA-C as PCP - General (Physician Assistant) Bernie Lamar PARAS, MD as PCP - Cardiology (Cardiology) Inocencio Soyla Lunger, MD as PCP - Electrophysiology (Cardiology) Burt Fus, DPM as Consulting Physician (Podiatry)  I have personally reviewed and noted the following in the patient's chart:   Medical and social history Use of alcohol, tobacco or illicit drugs  Current medications and supplements including opioid prescriptions. Functional ability and status Nutritional status Physical activity Advanced directives List of other physicians Hospitalizations, surgeries, and ER visits in previous 12 months Vitals Screenings to include cognitive, depression, and falls Referrals and appointments  No orders of the defined types were placed in this encounter.  In addition, I have reviewed and discussed with patient certain preventive protocols, quality metrics, and best practice recommendations. A written personalized care plan for preventive services as well as general preventive  health recommendations were provided to patient.   Shannon Baxter, [place]   08/26/2024   No follow-ups on file.  After Visit Summary: (  MyChart) Due to this being a telephonic visit, the after visit summary with patients personalized plan was offered to patient via MyChart   Nurse Notes: nothing to report

## 2024-08-28 ENCOUNTER — Encounter: Payer: Self-pay | Admitting: *Deleted

## 2024-08-28 NOTE — Progress Notes (Signed)
 This visit was performed by a medical professional under my direct supervision. I was immediately available for consultation/collaboration. I have reviewed and agree with the Annual Wellness Visit documentation.  Benigno Check, PA-C

## 2024-08-31 ENCOUNTER — Encounter: Payer: Self-pay | Admitting: Cardiology

## 2024-08-31 ENCOUNTER — Ambulatory Visit: Attending: Cardiology | Admitting: Cardiology

## 2024-08-31 VITALS — BP 150/78 | HR 73 | Ht 72.0 in | Wt 323.0 lb

## 2024-08-31 DIAGNOSIS — I480 Paroxysmal atrial fibrillation: Secondary | ICD-10-CM

## 2024-08-31 DIAGNOSIS — E119 Type 2 diabetes mellitus without complications: Secondary | ICD-10-CM

## 2024-08-31 DIAGNOSIS — G4733 Obstructive sleep apnea (adult) (pediatric): Secondary | ICD-10-CM

## 2024-08-31 DIAGNOSIS — I5032 Chronic diastolic (congestive) heart failure: Secondary | ICD-10-CM

## 2024-08-31 DIAGNOSIS — Z952 Presence of prosthetic heart valve: Secondary | ICD-10-CM

## 2024-08-31 DIAGNOSIS — Z95810 Presence of automatic (implantable) cardiac defibrillator: Secondary | ICD-10-CM

## 2024-08-31 NOTE — Progress Notes (Signed)
 Cardiology Office Note:    Date:  08/31/2024   ID:  Shannon Baxter, DOB [birth year], MRN [id number]  PCP:  Shannon Lang DASEN, PA-C  Cardiologist:  Shannon Fitch, MD    Referring MD: Shannon Vicenta BRAVO, MD   No chief complaint on file. Doing fine  History of Present Illness:    Shannon Baxter is a 63 y.o. female past medical history significant for arctic valve replacement 23 mm 100 postvisit on MI in 2017, cardiomyopathy ejection fraction 35%, paroxysmal atrial fibrillation, obesity, BiV pacer.  Comes today to months for follow-up last time I seen her she was complaining of shortness of breath is better, echocardiogram is being done which showed presence of similar ejection fraction to prior, proBNP was stable.  She described that yesterday she was very dizzy she developed LOC she went to the [facility] she did some shopping after that was exhausted and slept some and now doing fine no chest pain tightness squeezing pressure in chest no palpitation dizziness swelling of lower extremities  Past Medical History:  Diagnosis Date   Acute on chronic systolic heart failure (HCC) 02/21/2016   Allergy    Aortic insufficiency    Aortic stenosis    Arthritis    Asthma    Atherosclerosis of native arteries of extremities with intermittent claudication, bilateral legs 08/04/2015   Carotid atherosclerosis 07/26/2015   CHF (congestive heart failure) (HCC)    Chronic systolic congestive heart failure (HCC) 12/06/2016   Claudication    Congestive heart failure (CHF) (HCC)    Coronary artery disease    Coronary artery disease involving native coronary artery of native heart without angina pectoris 07/26/2015   Diabetes mellitus without complication (HCC)    Dilated cardiomyopathy (HCC) 09/03/2016   Overview:  Ejection fraction 20-25%   Dyslipidemia 07/26/2015   Dyspnea on exertion 10/26/2015   Essential hypertension 07/26/2015   GERD (gastroesophageal reflux disease)    Heart murmur    History of  mechanical aortic valve replacement 03/29/2016   Overview:  May 2017. [facility] 23 mm  Formatting of this note might be different from the original. Overview:  May 2017. [facility] 23 mm Formatting of this note might be different from the original. May 2017. [facility] 23 mm   History of tobacco abuse 07/26/2015   Hyperlipidemia    Hypertension    Hypothyroidism 01/23/2016   Intermittent claudication 07/26/2015   Morbid obesity (HCC) 12/06/2016   Nutritional and metabolic cardiomyopathy (HCC)    Paroxysmal atrial fibrillation (HCC) 03/06/2016   Paroxysmal atrial flutter (HCC) 09/01/2018   Peripheral vascular disease 07/26/2015   Presence of automatic implantable cardioverter-defibrillator 10/23/2016   PVD (peripheral vascular disease)    Rheumatic aortic valve insufficiency 03/05/2016   Sleep apnea    Stroke (HCC)    Type 2 diabetes mellitus with diabetic peripheral angiopathy without gangrene, without long-term current use of insulin (HCC) 11/22/2015    Past Surgical History:  Procedure Laterality Date   A-FLUTTER ABLATION N/A 11/05/2018   Procedure: A-FLUTTER ABLATION;  Surgeon: Inocencio Soyla Lunger, MD;  Location: [facility] INVASIVE [facility];  Service: Cardiovascular;  Laterality: N/A;   CARDIAC CATHETERIZATION     INSERT / REPLACE / REMOVE PACEMAKER     Med[employer]onic ICD   MECHANICAL AORTIC VALVE REPLACEMENT     PERIPHERAL ARTERIAL STENT GRAFT     TUBAL LIGATION      Current Medications: Current Meds  Medication Sig   acetaminophen  (TYLENOL ) 650 MG CR tablet Take 2,600 mg by  mouth daily.   albuterol  (PROVENTIL  HFA;VENTOLIN  HFA) 108 (90 Base) MCG/ACT inhaler Inhale 1-2 puffs into the lungs every 6 (six) hours as needed for wheezing or shortness of breath.   albuterol  (PROVENTIL ) (2.5 MG/3ML) 0.083% nebulizer solution Take 2.5 mg by nebulization every 4 (four) hours as needed for shortness of breath or wheezing.   ALPRAZolam  (XANAX ) 0.5 MG tablet TAKE 1 TABLET (0.5 MG TOTAL) BY MOUTH 3 (THREE)  TIMES DAILY AS NEEDED FOR ANXIETY.   amiodarone  (PACERONE ) 200 MG tablet TAKE 1/2 TABLET BY MOUTH DAILY   carvedilol  (COREG ) 25 MG tablet TAKE 1 TABLET BY MOUTH TWICE A DAY   clopidogrel (PLAVIX) 75 MG tablet Take 75 mg by mouth daily after breakfast.    Cyanocobalamin (B-12) 2000 MCG TABS Take 2 tablets by mouth daily.   ENTRESTO  49-51 MG TAKE 1 TABLET BY MOUTH TWICE A DAY   EPIPEN 2-PAK 0.3 MG/0.3ML SOAJ injection Inject 0.3 mg into the muscle once.    ezetimibe (ZETIA) 10 MG tablet Take 10 mg by mouth daily.   fluticasone (FLONASE) 50 MCG/ACT nasal spray Place 2 sprays into both nostrils daily as needed for allergies.    furosemide  (LASIX ) 40 MG tablet Take 80 mg by mouth 2 (two) times daily. (Patient taking differently: Take 80 mg by mouth as needed.)   gabapentin (NEURONTIN) 300 MG capsule Take 1 capsule (300 mg total) by mouth 3 (three) times daily.   Galcanezumab -gnlm (EMGALITY ) 120 MG/ML SOAJ Inject 120 mg into the skin every 30 (thirty) days.   hydrALAZINE (APRESOLINE) 25 MG tablet Take 25 mg by mouth 3 (three) times daily.   levothyroxine (SYNTHROID) 100 MCG tablet Take 100 mcg by mouth daily.   linaclotide (LINZESS) 72 MCG capsule Take 72 mcg by mouth daily as needed (constipation).   loratadine (CLARITIN) 10 MG tablet Take 10 mg by mouth at bedtime.    metFORMIN (GLUCOPHAGE) 500 MG tablet Take 500 mg by mouth daily.   montelukast (SINGULAIR) 10 MG tablet Take 10 mg by mouth at bedtime.    Multiple Vitamins-Minerals (CENTRUM SILVER ULTRA WOMENS) TABS Take 1 tablet by mouth daily. Unknown strength   nystatin (MYCOSTATIN/NYSTOP) powder Apply 1 application topically 3 (three) times daily. Unknown strength   omeprazole (PRILOSEC) 20 MG capsule Take 20 mg by mouth at bedtime.    ondansetron  (ZOFRAN -ODT) 4 MG disintegrating tablet Take 4 mg by mouth every 6 (six) hours.   Potassium Chloride  ER 20 MEQ TBCR Take 1 tablet (20 mEq total) by mouth daily.   Red Yeast Rice 600 MG TABS Take 2  tablets by mouth daily.   rosuvastatin  (CRESTOR ) 40 MG tablet Take 1 tablet (40 mg total) by mouth daily.   spironolactone  (ALDACTONE ) 25 MG tablet TAKE 1/2 TABLET BY MOUTH EVERY DAY   tirzepatide (MOUNJARO) 2.5 MG/0.5ML Pen Inject 2.5 mg into the skin once a week. We will plan to increase to the next dose after one month.   TRELEGY ELLIPTA 200-62.5-25 MCG/ACT AEPB Inhale 1 puff into the lungs daily.   Ubrogepant  (UBRELVY ) 50 MG TABS Take 1 tablet (50 mg total) by mouth as needed (migraine). May repeat a dose in 2 hours if needed. Max dose 2 pills in 24 hours   warfarin (COUMADIN) 4 MG tablet Take 4 mg by mouth daily.   warfarin (COUMADIN) 5 MG tablet Take 5 mg by mouth See admin instructions. Take 6 mg on Sunday, Tuesday, and  Thursday . Take 5 mg on all other days  Allergies:   Iodinated contrast media, Ioxaglate, and Latex   Social History   Socioeconomic History   Marital status: Single    Spouse name: Not on file   Number of children: Not on file   Years of education: Not on file   Highest education level: Not on file  Occupational History   Not on file  Tobacco Use   Smoking status: Former    Current packs/day: 0.00    Average packs/day: 0.3 packs/day for 20.0 years (5.0 ttl pk-yrs)    Types: Cigarettes    Start date: 70    Quit date: 2014    Years since quitting: 11.8    Passive exposure: Past   Smokeless tobacco: Never  Vaping Use   Vaping status: Never Used  Substance and Sexual Activity   Alcohol use: Not Currently    Comment: quit 2010   Drug use: Not Currently    Types: Crack cocaine   Sexual activity: Not on file  Other Topics Concern   Not on file  Social History Narrative   Lives alone   1 daughter living and 1 deceased daughter   Social Drivers of Health   Financial Resource Strain: Not on file  Food Insecurity: No Food Insecurity (08/26/2024)   Hunger Vital Sign    Worried About Running Out of Food in the Last Year: Never true    Ran Out of  Food in the Last Year: Never true  Transportation Needs: No Transportation Needs (08/26/2024)   PRAPARE - Administrator, Civil Service (Medical): No    Lack of Transportation (Non-Medical): No  Physical Activity: Insufficiently Active (08/26/2024)   Exercise Vital Sign    Days of Exercise per Week: 4 days    Minutes of Exercise per Session: 30 min  Stress: No Stress Concern Present (08/26/2024)   [employer] of Occupational Health - Occupational Stress Questionnaire    Feeling of Stress: Only a little  Social Connections: Moderately Integrated (08/26/2024)   Social Connection and Isolation Panel    Frequency of Communication with Friends and Family: More than three times a week    Frequency of Social Gatherings with Friends and Family: More than three times a week    Attends Religious Services: More than 4 times per year    Active Member of [employer] or Organizations: Yes    Attends Engineer, Structural: More than 4 times per year    Marital Status: Never married     Family History: The patient's family history includes Diabetes in her mother, sister, and sister; Heart Problems in her mother; Heart attack in her father; Parkinson's disease in her brother. ROS:   Please see the history of present illness.    All 14 point review of systems negative except as described per history of present illness  EKGs/Labs/Other Studies Reviewed:    EKG Interpretation Date/Time:  Monday August 31 2024 10:07:58 EST Ventricular Rate:  73 PR Interval:  232 QRS Duration:  118 QT Interval:  418 QTC Calculation: 460 R Axis:   31  Text Interpretation: Sinus rhythm with 1st degree A-V block Cannot rule out Anterior infarct , age undetermined When compared with ECG of 25-May-2024 10:17, T wave inversion no longer evident in Lateral leads Confirmed by Bernie Charleston [phone]) on 08/31/2024 10:16:22 AM    Recent Labs: 05/25/2024: NT-Pro BNP 489 06/24/2024: Magnesium 2.1 07/22/2024:  ALT 14; BUN 20; Creatinine, Ser 0.99; Hemoglobin 12.7; Platelets 215; Potassium 4.5; Sodium 141  Recent Lipid Panel    Component Value Date/Time   CHOL 156 07/22/2024 1050   TRIG 89 07/22/2024 1050   HDL 46 07/22/2024 1050   CHOLHDL 3.4 07/22/2024 1050   CHOLHDL 4.3 07/04/2007 0815   VLDL 28 07/04/2007 0815   LDLCALC 93 07/22/2024 1050    Physical Exam:    VS:  BP (!) 150/78   Pulse 73   Ht 6' (1.829 m)   Wt (!) 323 lb (146.5 kg)   SpO2 98%   BMI 43.81 kg/m     Wt Readings from Last 3 Encounters:  08/31/24 (!) 323 lb (146.5 kg)  08/26/24 (!) 315 lb (142.9 kg)  07/22/24 (!) 320 lb 8 oz (145.4 kg)     GEN:  Well nourished, well developed in no acute distress HEENT: Normal NECK: No JVD; No carotid bruits LYMPHATICS: No lymphadenopathy CARDIAC: RRR, no murmurs, no rubs, no gallops crisp mechanical valve sounds RESPIRATORY:  Clear to auscultation without rales, wheezing or rhonchi  ABDOMEN: Soft, non-tender, non-distended MUSCULOSKELETAL:  No edema; No deformity  SKIN: Warm and dry LOWER EXTREMITIES: no swelling NEUROLOGIC:  Alert and oriented x 3 PSYCHIATRIC:  Normal affect   ASSESSMENT:    1. Paroxysmal atrial fibrillation (HCC)   2. Chronic diastolic congestive heart failure (HCC)   3. OSA on CPAP   4. Diabetes mellitus without complication (HCC)   5. History of mechanical aortic valve replacement   6. Presence of automatic implantable cardioverter-defibrillator    PLAN:    In order of problems listed above:  Paroxysmal atrial fibrillation, tach sinus rhythm, continue anticoagulation. Chronic systolic and diastolic congestive heart failure compensated on guideline directed medical therapy which continue. Status post aortic valve replacement which essentially prosthesis 23 mm done in 2017 stable. ICD present.  Stable follow-up by our EP team. Overall she is doing well continue present management   Medication Adjustments/Labs and Tests Ordered: Current  medicines are reviewed at length with the patient today.  Concerns regarding medicines are outlined above.  Orders Placed This Encounter  Procedures   EKG 12-Lead   Medication changes: No orders of the defined types were placed in this encounter.   Signed, Shannon DOROTHA Fitch, MD, [facility] 08/31/2024 10:26 AM    [facility]

## 2024-08-31 NOTE — Patient Instructions (Signed)

## 2024-09-03 ENCOUNTER — Ambulatory Visit (INDEPENDENT_AMBULATORY_CARE_PROVIDER_SITE_OTHER): Admitting: Student

## 2024-09-03 ENCOUNTER — Encounter (HOSPITAL_BASED_OUTPATIENT_CLINIC_OR_DEPARTMENT_OTHER): Payer: Self-pay | Admitting: Student

## 2024-09-03 VITALS — BP 116/53 | HR 67 | Temp 97.8°F | Resp 16 | Ht 72.0 in | Wt 323.0 lb

## 2024-09-03 DIAGNOSIS — Z6841 Body Mass Index (BMI) 40.0 and over, adult: Secondary | ICD-10-CM

## 2024-09-03 DIAGNOSIS — E1151 Type 2 diabetes mellitus with diabetic peripheral angiopathy without gangrene: Secondary | ICD-10-CM

## 2024-09-03 DIAGNOSIS — F4321 Adjustment disorder with depressed mood: Secondary | ICD-10-CM

## 2024-09-03 DIAGNOSIS — F411 Generalized anxiety disorder: Secondary | ICD-10-CM | POA: Insufficient documentation

## 2024-09-03 DIAGNOSIS — Z91041 Radiographic dye allergy status: Secondary | ICD-10-CM

## 2024-09-03 DIAGNOSIS — J9611 Chronic respiratory failure with hypoxia: Secondary | ICD-10-CM

## 2024-09-03 DIAGNOSIS — I480 Paroxysmal atrial fibrillation: Secondary | ICD-10-CM

## 2024-09-03 DIAGNOSIS — I10 Essential (primary) hypertension: Secondary | ICD-10-CM

## 2024-09-03 DIAGNOSIS — B379 Candidiasis, unspecified: Secondary | ICD-10-CM

## 2024-09-03 DIAGNOSIS — F1320 Sedative, hypnotic or anxiolytic dependence, uncomplicated: Secondary | ICD-10-CM

## 2024-09-03 DIAGNOSIS — J410 Simple chronic bronchitis: Secondary | ICD-10-CM

## 2024-09-03 DIAGNOSIS — G4733 Obstructive sleep apnea (adult) (pediatric): Secondary | ICD-10-CM

## 2024-09-03 DIAGNOSIS — Z8673 Personal history of transient ischemic attack (TIA), and cerebral infarction without residual deficits: Secondary | ICD-10-CM | POA: Insufficient documentation

## 2024-09-03 MED ORDER — EPIPEN 2-PAK 0.3 MG/0.3ML IJ SOAJ: 0.3000 mg | Freq: Once | INTRAMUSCULAR | 3 refills | Status: AC

## 2024-09-03 MED ORDER — NYSTATIN 100000 UNIT/GM EX POWD: 1.0000 | Freq: Three times a day (TID) | CUTANEOUS | 0 refills | Status: AC

## 2024-09-03 MED ORDER — TIRZEPATIDE 5 MG/0.5ML ~~LOC~~ SOAJ: 5.0000 mg | SUBCUTANEOUS | 0 refills | Status: DC

## 2024-09-03 NOTE — Progress Notes (Signed)
 Established Patient Office Visit  Subjective   Patient ID: Shannon Baxter, female    DOB: 1961-03-16  Age: 63 y.o. MRN: [id number]  Chief Complaint  Patient presents with   Medical Management of Chronic Issues    follow up     HPI  Discussed the use of AI scribe software for clinical note transcription with the patient, who gave verbal consent to proceed.  History of Present Illness   Shannon Baxter is a 63 year old female who presents for follow-up on Mounjaro medication.  She started Mounjaro about a month ago and has used all four pens as prescribed without experiencing nausea. She perceives some weight loss, though notes discrepancies with the scale due to clothing.  She has a history of atrial fibrillation and is on anticoagulation therapy with Plavix. She experienced a 'little flutter' last night but continues her medication regimen, which includes carvedilol  twice daily for rate control and blood pressure management. She also has chronic systolic and diastolic heart failure and underwent an aortic valve replacement in 2017 with an ICD in place.  She is dealing with anxiety and uses Xanax , not reducing her dosage due to recent stressors, including the disappearance and death of a friend. She finds support through her church and pastor and is involved with the youth group, emphasizing safety.  She has a known allergy to contrast and carries an EpiPen for potential allergic reactions. She uses nystatin powder for skin breakdown under her breasts and stomach, applying it three times daily until the rash resolves.  She uses a CPAP machine with supplemental oxygen  at night and occasionally during the day when tired. She is on Trelegy for respiratory issues and reports that her inhalers are effective. She also takes Lasix  as needed for swelling, using it less than daily.  She has been experiencing orthopedic symptoms and has an upcoming appointment to discuss potential treatments.         Patient Active Problem List   Diagnosis Date Noted   Simple chronic bronchitis (HCC) 07/22/2024   Benzodiazepine dependence (HCC) 07/22/2024   Atrial fibrillation (HCC) 07/14/2024   Acute venous embolism and thrombosis of deep vessels of proximal lower extremity (HCC) 07/14/2024   Long term (current) use of anticoagulants 07/14/2024   Restless leg 06/24/2024   Chronic hypoxemic respiratory failure (HCC) 06/24/2024   Chronic anticoagulation 06/24/2024   CHF (congestive heart failure) (HCC)    Stroke (HCC)    OSA on CPAP    PVD (peripheral vascular disease)    Nutritional and metabolic cardiomyopathy (HCC)    Hypertension    Hyperlipidemia    Heart murmur    GERD (gastroesophageal reflux disease)    Diabetes mellitus without complication (HCC)    Claudication    Congestive heart failure (CHF) (HCC)    Asthma    Arthritis    Aortic stenosis    Aortic insufficiency    Allergy    Coronary artery disease    Paroxysmal atrial flutter (HCC) 09/01/2018   Chronic systolic congestive heart failure (HCC) 12/06/2016   Morbid obesity (HCC) 12/06/2016   Presence of automatic implantable cardioverter-defibrillator 10/23/2016   Dilated cardiomyopathy (HCC) 09/03/2016   History of mechanical aortic valve replacement 03/29/2016   Paroxysmal atrial fibrillation (HCC) 03/06/2016   Rheumatic aortic valve insufficiency 03/05/2016   Hypothyroidism 01/23/2016   Type 2 diabetes mellitus with diabetic peripheral angiopathy without gangrene, without long-term current use of insulin (HCC) 11/22/2015   Dyspnea on exertion 10/26/2015   Atherosclerosis of  native arteries of extremities with intermittent claudication, bilateral legs 08/04/2015   Carotid atherosclerosis 07/26/2015   Coronary artery disease involving native coronary artery of native heart without angina pectoris 07/26/2015   Dyslipidemia 07/26/2015   Essential hypertension 07/26/2015   Intermittent claudication 07/26/2015    Peripheral vascular disease 07/26/2015   History of tobacco abuse 07/26/2015   Past Medical History:  Diagnosis Date   Acute on chronic systolic heart failure (HCC) 02/21/2016   Allergy    Aortic insufficiency    Aortic stenosis    Arthritis    Asthma    Atherosclerosis of native arteries of extremities with intermittent claudication, bilateral legs 08/04/2015   Carotid atherosclerosis 07/26/2015   CHF (congestive heart failure) (HCC)    Chronic systolic congestive heart failure (HCC) 12/06/2016   Claudication    Congestive heart failure (CHF) (HCC)    Coronary artery disease    Coronary artery disease involving native coronary artery of native heart without angina pectoris 07/26/2015   Diabetes mellitus without complication (HCC)    Dilated cardiomyopathy (HCC) 09/03/2016   Overview:  Ejection fraction 20-25%   Dyslipidemia 07/26/2015   Dyspnea on exertion 10/26/2015   Essential hypertension 07/26/2015   GERD (gastroesophageal reflux disease)    Heart murmur    History of mechanical aortic valve replacement 03/29/2016   Overview:  May 2017. [facility] 23 mm  Formatting of this note might be different from the original. Overview:  May 2017. [facility] 23 mm Formatting of this note might be different from the original. May 2017. [facility] 23 mm   History of tobacco abuse 07/26/2015   Hyperlipidemia    Hypertension    Hypothyroidism 01/23/2016   Intermittent claudication 07/26/2015   Morbid obesity (HCC) 12/06/2016   Nutritional and metabolic cardiomyopathy (HCC)    Paroxysmal atrial fibrillation (HCC) 03/06/2016   Paroxysmal atrial flutter (HCC) 09/01/2018   Peripheral vascular disease 07/26/2015   Presence of automatic implantable cardioverter-defibrillator 10/23/2016   PVD (peripheral vascular disease)    Rheumatic aortic valve insufficiency 03/05/2016   Sleep apnea    Stroke (HCC)    Type 2 diabetes mellitus with diabetic peripheral angiopathy without gangrene, without  long-term current use of insulin (HCC) 11/22/2015   Social History   Tobacco Use   Smoking status: Former    Current packs/day: 0.00    Average packs/day: 0.3 packs/day for 20.0 years (5.0 ttl pk-yrs)    Types: Cigarettes    Start date: 72    Quit date: 2014    Years since quitting: 11.8    Passive exposure: Past   Smokeless tobacco: Never  Vaping Use   Vaping status: Never Used  Substance Use Topics   Alcohol use: Not Currently    Comment: quit 2010   Drug use: Not Currently    Types: Crack cocaine   Allergies  Allergen Reactions   Iodinated Contrast Media Hives and Rash   Ioxaglate Hives   Latex Hives      ROS Per HPI.    Objective:     BP (!) 116/53   Pulse 67   Temp 97.8 F (36.6 C) (Oral)   Resp 16   Ht 6' (1.829 m)   Wt (!) 323 lb (146.5 kg)   SpO2 96%   BMI 43.81 kg/m  BP Readings from Last 3 Encounters:  09/03/24 (!) 116/53  08/31/24 (!) 150/78  08/26/24 127/78   Wt Readings from Last 3 Encounters:  09/03/24 (!) 323 lb (146.5 kg)  08/31/24 (!) 323 lb (146.5 kg)  08/26/24 (!) 315 lb (142.9 kg)   SpO2 Readings from Last 3 Encounters:  09/03/24 96%  08/31/24 98%  07/22/24 96%      Physical Exam Constitutional:      General: She is not in acute distress.    Appearance: Normal appearance. She is not ill-appearing.  HENT:     Head: Normocephalic and atraumatic.     Nose: Nose normal.  Eyes:     General: No scleral icterus.    Conjunctiva/sclera: Conjunctivae normal.  Cardiovascular:     Rate and Rhythm: Normal rate and regular rhythm.     Heart sounds: Normal heart sounds. No murmur heard.    No friction rub.  Pulmonary:     Effort: Pulmonary effort is normal. No respiratory distress.     Breath sounds: Normal breath sounds. No wheezing, rhonchi or rales.  Musculoskeletal:        General: Normal range of motion.     Right lower leg: Edema (1+ pitting) present.     Left lower leg: Edema (1+ pitting) present.  Skin:    General:  Skin is warm and dry.     Coloration: Skin is not jaundiced or pale.  Neurological:     General: No focal deficit present.     Mental Status: She is alert.  Psychiatric:        Mood and Affect: Mood normal.        Behavior: Behavior normal.      No results found for any visits on 09/03/24.  Last CBC Lab Results  Component Value Date   WBC 6.0 07/22/2024   HGB 12.7 07/22/2024   HCT 40.9 07/22/2024   MCV 89 07/22/2024   MCH 27.6 07/22/2024   RDW 14.8 07/22/2024   PLT 215 07/22/2024   Last metabolic panel Lab Results  Component Value Date   GLUCOSE 91 07/22/2024   NA 141 07/22/2024   K 4.5 07/22/2024   CL 104 07/22/2024   CO2 21 07/22/2024   BUN 20 07/22/2024   CREATININE 0.99 07/22/2024   EGFR 64 07/22/2024   CALCIUM  9.4 07/22/2024   PROT 7.0 07/22/2024   ALBUMIN 4.0 07/22/2024   LABGLOB 3.0 07/22/2024   AGRATIO 1.1 (L) 12/27/2022   BILITOT 0.4 07/22/2024   ALKPHOS 62 07/22/2024   AST 21 07/22/2024   ALT 14 07/22/2024   Last lipids Lab Results  Component Value Date   CHOL 156 07/22/2024   HDL 46 07/22/2024   LDLCALC 93 07/22/2024   TRIG 89 07/22/2024   CHOLHDL 3.4 07/22/2024   Last hemoglobin A1c Lab Results  Component Value Date   HGBA1C 5.7 (H) 07/22/2024      The ASCVD Risk score (Arnett DK, et al., 2019) failed to calculate for the following reasons:   Risk score cannot be calculated because patient has a medical history suggesting prior/existing ASCVD    Assessment & Plan:   Assessment and Plan    Type 2 diabetes mellitus with diabetic peripheral angiopathy and obesity Currently on Mounjaro for weight management. No nausea reported. Not yet at maintenance dose. Weight loss expected to increase as dose is titrated. - Increased Mounjaro dose - Scheduled follow-up in 4 weeks to assess weight loss and adjust Mounjaro dose as needed  Paroxysmal atrial fibrillation and chronic systolic and diastolic heart failure, status post aortic valve  replacement and ICD placement Atrial fibrillation well-managed with Plavix and carvedilol . Recent episode of flutter noted. Heart failure  appears mostly compensated with current therapy, may need one extra dose of lasix  with 1+ pitting pedal edema. Aortic valve replacement and ICD are stable. - Continue Plavix and carvedilol  - Continue follow-up with cardiology team  Essential hypertension Blood pressure well-controlled with carvedilol . - Continue carvedilol   Chronic respiratory failure with hypoxia, obstructive sleep apnea, and simple chronic bronchitis Using CPAP with supplemental oxygen . Inhalers are effective. - Continue CPAP with supplemental oxygen  - Continue current inhaler regimen  Candidal intertrigo (cutaneous candidiasis) Skin breakdown under breast and stomach. Nystatin powder effective. - Continue nystatin powder three times daily until rash resolves  Radiographic contrast allergy Allergy to contrast noted. - Prescribed EpiPen for pt concern over anaphylaxis  Sedative, hypnotic, or anxiolytic dependence with generalized anxiety disorder and adjustment disorder with depressed mood Currently on Xanax . Recent stress due to personal events. No reduction in Xanax  dosage at this time. Engaged with pastor for support. - Continue current Xanax  dosage - Encouraged reduction of Xanax  dosage over time      Return in about 4 weeks (around 10/01/2024) for tirzepatide titration for T2DM.    Adedamola Seto T Karia Ehresman, PA-C

## 2024-09-03 NOTE — Patient Instructions (Signed)
 It was nice to see you today!  If you have any problems before your next visit feel free to message me via MyChart (minor issues or questions) or call the office, otherwise you may reach out to schedule an office visit.  Thank you! Pau Banh, PA-C

## 2024-09-08 ENCOUNTER — Ambulatory Visit: Attending: Cardiology

## 2024-09-08 DIAGNOSIS — I480 Paroxysmal atrial fibrillation: Secondary | ICD-10-CM | POA: Diagnosis not present

## 2024-09-08 DIAGNOSIS — Z952 Presence of prosthetic heart valve: Secondary | ICD-10-CM

## 2024-09-08 DIAGNOSIS — I4892 Unspecified atrial flutter: Secondary | ICD-10-CM

## 2024-09-08 DIAGNOSIS — Z7901 Long term (current) use of anticoagulants: Secondary | ICD-10-CM | POA: Diagnosis not present

## 2024-09-08 LAB — POCT INR: INR: 3.2 — AB (ref 2.0–3.0)

## 2024-09-08 NOTE — Patient Instructions (Signed)
 Continue taking 1 tablet daily.  INR in 5 weeks.  [phone]; Eat greens tonight.

## 2024-09-08 NOTE — Progress Notes (Unsigned)
 done

## 2024-09-24 ENCOUNTER — Other Ambulatory Visit (HOSPITAL_BASED_OUTPATIENT_CLINIC_OR_DEPARTMENT_OTHER): Payer: Self-pay | Admitting: Student

## 2024-09-24 DIAGNOSIS — F411 Generalized anxiety disorder: Secondary | ICD-10-CM

## 2024-09-24 MED ORDER — HYDRALAZINE HCL 25 MG PO TABS: 25.0000 mg | ORAL_TABLET | Freq: Three times a day (TID) | ORAL | 3 refills | Status: AC

## 2024-09-24 NOTE — Telephone Encounter (Unsigned)
 Copied from CRM #[id number]. Topic: Clinical - Medication Refill >> Sep 24, 2024 12:20 PM Kevelyn M wrote: Medication: hydrALAZINE (APRESOLINE) 25 MG table, was prescribed by previous provider  Has the patient contacted their pharmacy? No (Agent: If no, request that the patient contact the pharmacy for the refill. If patient does not wish to contact the pharmacy document the reason why and proceed with request.) (Agent: If yes, when and what did the pharmacy advise?)  This is the patient's preferred pharmacy:  [facility] [address] [place] - [address] [place] [address] Phone: [phone] Fax: [phone]  Is this the correct pharmacy for this prescription? Yes If no, delete pharmacy and type the correct one.   Has the prescription been filled recently? No  Is the patient out of the medication? Yes  Has the patient been seen for an appointment in the last year OR does the patient have an upcoming appointment? Yes  Can we respond through MyChart? No  Agent: Please be advised that Rx refills may take up to 3 business days. We ask that you follow-up with your pharmacy.

## 2024-09-25 ENCOUNTER — Other Ambulatory Visit (HOSPITAL_BASED_OUTPATIENT_CLINIC_OR_DEPARTMENT_OTHER): Payer: Self-pay | Admitting: Student

## 2024-09-25 NOTE — Telephone Encounter (Signed)
 Copied from CRM [id number]. Topic: Clinical - Medication Refill >> Sep 25, 2024 10:32 AM Travis F wrote: Medication: metFORMIN (GLUCOPHAGE) 500 MG tablet [[id number]]  Has the patient contacted their pharmacy? Yes  (Agent: If yes, when and what did the pharmacy advise?) contact office   This is the patient's preferred pharmacy:  [facility] [address], [place] - [address] [place] [address] Phone: [phone] Fax: [phone]  Is this the correct pharmacy for this prescription? Yes If no, delete pharmacy and type the correct one.   Has the prescription been filled recently? Yes  Is the patient out of the medication? Yes  Has the patient been seen for an appointment in the last year OR does the patient have an upcoming appointment? Yes  Can we respond through MyChart? Yes  Agent: Please be advised that Rx refills may take up to 3 business days. We ask that you follow-up with your pharmacy.

## 2024-09-28 ENCOUNTER — Ambulatory Visit: Admitting: Podiatry

## 2024-09-28 NOTE — Telephone Encounter (Signed)
 LMTCB

## 2024-09-29 ENCOUNTER — Telehealth (HOSPITAL_BASED_OUTPATIENT_CLINIC_OR_DEPARTMENT_OTHER): Payer: Self-pay

## 2024-09-29 NOTE — Telephone Encounter (Signed)
 Pt advised to discontinue metformin since she is taking mounjaro . She voices understanding.

## 2024-09-30 ENCOUNTER — Encounter (HOSPITAL_BASED_OUTPATIENT_CLINIC_OR_DEPARTMENT_OTHER): Payer: Self-pay | Admitting: Student

## 2024-09-30 ENCOUNTER — Ambulatory Visit (INDEPENDENT_AMBULATORY_CARE_PROVIDER_SITE_OTHER): Admitting: Student

## 2024-09-30 VITALS — BP 138/77 | HR 78 | Temp 97.8°F | Resp 16 | Ht 72.0 in | Wt 319.8 lb

## 2024-09-30 DIAGNOSIS — K219 Gastro-esophageal reflux disease without esophagitis: Secondary | ICD-10-CM | POA: Diagnosis not present

## 2024-09-30 DIAGNOSIS — E039 Hypothyroidism, unspecified: Secondary | ICD-10-CM

## 2024-09-30 DIAGNOSIS — I480 Paroxysmal atrial fibrillation: Secondary | ICD-10-CM

## 2024-09-30 DIAGNOSIS — F411 Generalized anxiety disorder: Secondary | ICD-10-CM

## 2024-09-30 DIAGNOSIS — I5022 Chronic systolic (congestive) heart failure: Secondary | ICD-10-CM

## 2024-09-30 DIAGNOSIS — J441 Chronic obstructive pulmonary disease with (acute) exacerbation: Secondary | ICD-10-CM

## 2024-09-30 DIAGNOSIS — E1151 Type 2 diabetes mellitus with diabetic peripheral angiopathy without gangrene: Secondary | ICD-10-CM | POA: Diagnosis not present

## 2024-09-30 DIAGNOSIS — Z6841 Body Mass Index (BMI) 40.0 and over, adult: Secondary | ICD-10-CM | POA: Diagnosis not present

## 2024-09-30 MED ORDER — AMOXICILLIN-POT CLAVULANATE 875-125 MG PO TABS: 1.0000 | ORAL_TABLET | Freq: Two times a day (BID) | ORAL | 0 refills | Status: AC

## 2024-09-30 MED ORDER — PREDNISONE 20 MG PO TABS: 40.0000 mg | ORAL_TABLET | Freq: Every day | ORAL | 0 refills | Status: AC

## 2024-09-30 MED ORDER — TIRZEPATIDE 7.5 MG/0.5ML ~~LOC~~ SOAJ: 7.5000 mg | SUBCUTANEOUS | 3 refills | Status: DC

## 2024-09-30 NOTE — Patient Instructions (Addendum)
 It was nice to see you today!  As we discussed in clinic:  We have increased your mounjaro . Please let me know in one month if you would like to move up any farther.  Please let me know if your shortness of breath does not get any better. I have treated you for a COPD exacerbation- please finish the antibiotic and the steroid completely and take both with food to avoid any nausea.  If you have any problems before your next visit feel free to message me via MyChart (minor issues or questions) or call the office, otherwise you may reach out to schedule an office visit.  Thank you! Cinthia Rodden, PA-C

## 2024-09-30 NOTE — Progress Notes (Signed)
 Established Patient Office Visit  Subjective   Patient ID: Shannon Baxter, female    DOB: 27-Oct-1960  Age: 63 y.o. MRN: [id number]  Chief Complaint  Patient presents with   Medical Management of Chronic Issues    Follow up.    HPI  Discussed the use of AI scribe software for clinical note transcription with the patient, who gave verbal consent to proceed.  History of Present Illness   Shannon Baxter is a 63 year old female who presents for follow-up and titration of Mounjaro .  She is continuing her Mounjaro  dosage titration without major side effects such as nausea, vomiting, or abdominal pain. She has lost four pounds since her last visit.  She is currently on Crestor  40 mg and Zetia 10 mg for hyperlipidemia. Her cholesterol levels were mostly good at the last check, and she hopes for further improvement with continued weight loss.  She experiences anxiety, describing it as 'just crazy'. She is using her anxiety medication less frequently, about two to three times per day, with an increase to four times on Thanksgiving due to stress.  She has a history of atrial fibrillation, heart failure, chronic bronchitis, and COPD. She uses Trelegy in the morning after her breathing treatment and has albuterol  for emergencies. She feels short of breath and wheezes, especially in the morning, and coughs up sputum at night, but there is no change in color.  She is on thyroid  medication, which she takes in the morning away from food and other medications, and reports stability in her thyroid  condition. She also takes Prilosec for reflux without issues.  She does not drink alcohol and has not done so for twenty years. She finds support in talking to her pastor rather than attending formal counseling.      Patient Active Problem List   Diagnosis Date Noted   GAD (generalized anxiety disorder) 09/03/2024   History of stroke 09/03/2024   Simple chronic bronchitis (HCC) 07/22/2024   Benzodiazepine  dependence (HCC) 07/22/2024   Atrial fibrillation (HCC) 07/14/2024   Acute venous embolism and thrombosis of deep vessels of proximal lower extremity (HCC) 07/14/2024   Long term (current) use of anticoagulants 07/14/2024   Restless leg 06/24/2024   Chronic hypoxemic respiratory failure (HCC) 06/24/2024   CHF (congestive heart failure) (HCC)    OSA on CPAP    PVD (peripheral vascular disease)    Nutritional and metabolic cardiomyopathy (HCC)    Hypertension    GERD (gastroesophageal reflux disease)    Congestive heart failure (CHF) (HCC)    Asthma    Arthritis    Aortic stenosis    Aortic insufficiency    Coronary artery disease    Paroxysmal atrial flutter (HCC) 09/01/2018   Chronic systolic congestive heart failure (HCC) 12/06/2016   Morbid obesity (HCC) 12/06/2016   Presence of automatic implantable cardioverter-defibrillator 10/23/2016   Dilated cardiomyopathy (HCC) 09/03/2016   History of mechanical aortic valve replacement 03/29/2016   Paroxysmal atrial fibrillation (HCC) 03/06/2016   Hypothyroidism 01/23/2016   Type 2 diabetes mellitus with diabetic peripheral angiopathy without gangrene, without long-term current use of insulin (HCC) 11/22/2015   Atherosclerosis of native arteries of extremities with intermittent claudication, bilateral legs 08/04/2015   Carotid atherosclerosis 07/26/2015   Coronary artery disease involving native coronary artery of native heart without angina pectoris 07/26/2015   Essential hypertension 07/26/2015   Peripheral vascular disease 07/26/2015   History of tobacco abuse 07/26/2015   Hyperlipidemia 07/26/2015   Past Medical History:  Diagnosis Date  Aortic stenosis    Arthritis    Asthma    Atherosclerosis of native arteries of extremities with intermittent claudication, bilateral legs 08/04/2015   Carotid atherosclerosis 07/26/2015   Chronic systolic congestive heart failure (HCC) 12/06/2016   Coronary artery disease involving native  coronary artery of native heart without angina pectoris 07/26/2015   Diabetes mellitus without complication (HCC)    Dilated cardiomyopathy (HCC) 09/03/2016   Overview:  Ejection fraction 20-25%   Dyslipidemia 07/26/2015   Essential hypertension 07/26/2015   GERD (gastroesophageal reflux disease)    History of mechanical aortic valve replacement 03/29/2016   Overview:  May 2017. [facility] 23 mm  Formatting of this note might be different from the original. Overview:  May 2017. [facility] 23 mm Formatting of this note might be different from the original. May 2017. [facility] 23 mm   History of tobacco abuse 07/26/2015   Hyperlipidemia    Hypertension    Hypothyroidism 01/23/2016   Morbid obesity (HCC) 12/06/2016   Paroxysmal atrial fibrillation (HCC) 03/06/2016   Paroxysmal atrial flutter (HCC) 09/01/2018   Peripheral vascular disease 07/26/2015   Presence of automatic implantable cardioverter-defibrillator 10/23/2016   PVD (peripheral vascular disease)    Rheumatic aortic valve insufficiency 03/05/2016   Sleep apnea    Stroke (HCC)    Type 2 diabetes mellitus with diabetic peripheral angiopathy without gangrene, without long-term current use of insulin (HCC) 11/22/2015   Social History   Tobacco Use   Smoking status: Former    Current packs/day: 0.00    Average packs/day: 0.3 packs/day for 20.0 years (5.0 ttl pk-yrs)    Types: Cigarettes    Start date: 16    Quit date: 2014    Years since quitting: 11.9    Passive exposure: Past   Smokeless tobacco: Never  Vaping Use   Vaping status: Never Used  Substance Use Topics   Alcohol use: Not Currently    Comment: quit 2010   Drug use: Not Currently    Types: Crack cocaine   Allergies  Allergen Reactions   Iodinated Contrast Media Hives and Rash   Ioxaglate Hives   Latex Hives      ROS Per HPI.    Objective:     BP 138/77   Pulse 78   Temp 97.8 F (36.6 C) (Oral)   Resp 16   Ht 6' (1.829 m)   Wt (!) 319 lb  12.8 oz (145.1 kg)   SpO2 97%   BMI 43.37 kg/m  BP Readings from Last 3 Encounters:  09/30/24 138/77  09/03/24 (!) 116/53  08/31/24 (!) 150/78   Wt Readings from Last 3 Encounters:  09/30/24 (!) 319 lb 12.8 oz (145.1 kg)  09/03/24 (!) 323 lb (146.5 kg)  08/31/24 (!) 323 lb (146.5 kg)   SpO2 Readings from Last 3 Encounters:  09/30/24 97%  09/03/24 96%  08/31/24 98%      Physical Exam Constitutional:      General: She is not in acute distress.    Appearance: Normal appearance. She is not ill-appearing.  HENT:     Head: Normocephalic and atraumatic.     Nose: Nose normal.  Eyes:     General: No scleral icterus.    Conjunctiva/sclera: Conjunctivae normal.  Cardiovascular:     Rate and Rhythm: Normal rate and regular rhythm.     Heart sounds: Normal heart sounds. No murmur heard.    No friction rub.  Pulmonary:     Effort: Pulmonary effort  is normal. No respiratory distress.     Breath sounds: Normal breath sounds. No wheezing, rhonchi or rales.  Musculoskeletal:        General: Normal range of motion.     Right lower leg: Edema (1+ pitting) present.     Left lower leg: Edema (1+ pitting) present.  Skin:    General: Skin is warm and dry.     Coloration: Skin is not jaundiced or pale.  Neurological:     General: No focal deficit present.     Mental Status: She is alert.  Psychiatric:        Mood and Affect: Mood normal.        Behavior: Behavior normal.      No results found for any visits on 09/30/24.  Last CBC Lab Results  Component Value Date   WBC 6.0 07/22/2024   HGB 12.7 07/22/2024   HCT 40.9 07/22/2024   MCV 89 07/22/2024   MCH 27.6 07/22/2024   RDW 14.8 07/22/2024   PLT 215 07/22/2024   Last metabolic panel Lab Results  Component Value Date   GLUCOSE 91 07/22/2024   NA 141 07/22/2024   K 4.5 07/22/2024   CL 104 07/22/2024   CO2 21 07/22/2024   BUN 20 07/22/2024   CREATININE 0.99 07/22/2024   EGFR 64 07/22/2024   CALCIUM  9.4 07/22/2024    PROT 7.0 07/22/2024   ALBUMIN 4.0 07/22/2024   LABGLOB 3.0 07/22/2024   AGRATIO 1.1 (L) 12/27/2022   BILITOT 0.4 07/22/2024   ALKPHOS 62 07/22/2024   AST 21 07/22/2024   ALT 14 07/22/2024   Last lipids Lab Results  Component Value Date   CHOL 156 07/22/2024   HDL 46 07/22/2024   LDLCALC 93 07/22/2024   TRIG 89 07/22/2024   CHOLHDL 3.4 07/22/2024   Last hemoglobin A1c Lab Results  Component Value Date   HGBA1C 5.7 (H) 07/22/2024      The ASCVD Risk score (Arnett DK, et al., 2019) failed to calculate for the following reasons:   Risk score cannot be calculated because patient has a medical history suggesting prior/existing ASCVD    Assessment & Plan:   Assessment and Plan    Chronic obstructive pulmonary disease with acute exacerbation Reports increased shortness of breath and nocturnal cough with sputum production. Lungs auscultated clear, but wheezing suspected. No significant weight gain. Oxygen  saturation normal. - Prescribed Augmentin for COPD exacerbation x 5 days - Prescribed a 5-day course of steroids - 40 mg - Advised taking medications with food - Instructed to monitor for increased swelling due to steroids  T2DM/Morbid obesity Chronic, not at goal. Currently on Mounjaro  for weight management. No nausea, vomiting, or abdominal pain reported. Lost approximately four pounds since last visit. Plan to titrate Mounjaro  dose upwards to enhance weight loss. Has stopped metformin due to concerns over polypharmacy. - Increased Mounjaro  dose to 7.5 mg weekly - Discontinued 5 mg dose of Mounjaro  - Instructed to contact provider in one month if dose adjustment is needed - Scheduled follow-up in three months  Hyperlipidemia Chronic, not at goal of LDL < 70. Last lipid panel with LDL in 90s. Cholesterol levels mostly good on Crestor  40 mg and Zetia 10 mg. Anticipated improvement with continued weight loss from Mounjaro . - Continue current lipid-lowering  therapy  Generalized anxiety disorder Reports fluctuating anxiety levels. Currently using Xanax  less frequently, approximately two to three times daily. Plans to reduce usage further. Not in counseling but engages with pastor for support. -  Plan to decrease next rx of Xanax  with a note to limit to two times daily as needed - Encouraged discussion with pastor about coping skills - Advised to avoid alcohol due to interaction with Xanax   Hypothyroidism Chronic, stable. Well-managed on current thyroid  medication. Takes medication in the morning away from food and other medications. - Continue current thyroid  medication regimen  Gastroesophageal reflux disease Chronic, stable. No issues reported with current Prilosec regimen. - Continue current Prilosec regimen   Afib/Systolic HF/CAD - stable on amiodarone  and warfarin (managed by warfarin clinic) for AFIB.  -Good on GDMT for HF- Entresto  49-51, Aldactone  25 mg, not currently on SGLT2 (will keep in mind if we need to add another t2dm med in the future). Mostly euvolemic on exam today with minor LE pedal edema bilateral. - Continue to follow with cardiology. - Continue current regimen.      Return in about 3 months (around 12/29/2024), or if symptoms worsen or fail to improve, for DM.    Lanissa Cashen T Deandra Gadson, PA-C

## 2024-10-01 ENCOUNTER — Ambulatory Visit: Payer: Self-pay

## 2024-10-01 DIAGNOSIS — I480 Paroxysmal atrial fibrillation: Secondary | ICD-10-CM

## 2024-10-02 LAB — CUP PACEART REMOTE DEVICE CHECK
Battery Remaining Longevity: 21 mo
Battery Voltage: 2.94 V
Brady Statistic AP VP Percent: 0.01 %
Brady Statistic AP VS Percent: 2.24 %
Brady Statistic AS VP Percent: 0.05 %
Brady Statistic AS VS Percent: 97.7 %
Brady Statistic RA Percent Paced: 2.21 %
Brady Statistic RV Percent Paced: 0.06 %
Date Time Interrogation Session: 20251211022824
HighPow Impedance: 85 Ohm
Implantable Lead Connection Status: 753985
Implantable Lead Connection Status: 753985
Implantable Lead Implant Date: 20180102
Implantable Lead Implant Date: 20180102
Implantable Lead Location: 753859
Implantable Lead Location: 753860
Implantable Lead Model: 5076
Implantable Pulse Generator Implant Date: 20180102
Lead Channel Impedance Value: 380 Ohm
Lead Channel Impedance Value: 437 Ohm
Lead Channel Impedance Value: 437 Ohm
Lead Channel Pacing Threshold Amplitude: 0.5 V
Lead Channel Pacing Threshold Amplitude: 1 V
Lead Channel Pacing Threshold Pulse Width: 0.4 ms
Lead Channel Pacing Threshold Pulse Width: 0.4 ms
Lead Channel Sensing Intrinsic Amplitude: 2.5 mV
Lead Channel Sensing Intrinsic Amplitude: 2.5 mV
Lead Channel Sensing Intrinsic Amplitude: 31.375 mV
Lead Channel Sensing Intrinsic Amplitude: 31.375 mV
Lead Channel Setting Pacing Amplitude: 1.5 V
Lead Channel Setting Pacing Amplitude: 2.5 V
Lead Channel Setting Pacing Pulse Width: 0.4 ms
Lead Channel Setting Sensing Sensitivity: 0.3 mV
Zone Setting Status: 755011
Zone Setting Status: 755011

## 2024-10-05 ENCOUNTER — Ambulatory Visit: Admitting: Podiatry

## 2024-10-05 ENCOUNTER — Ambulatory Visit: Payer: Self-pay | Admitting: Cardiology

## 2024-10-05 ENCOUNTER — Encounter: Payer: Self-pay | Admitting: Podiatry

## 2024-10-05 DIAGNOSIS — M76821 Posterior tibial tendinitis, right leg: Secondary | ICD-10-CM

## 2024-10-05 DIAGNOSIS — M79674 Pain in right toe(s): Secondary | ICD-10-CM | POA: Diagnosis not present

## 2024-10-05 DIAGNOSIS — E1151 Type 2 diabetes mellitus with diabetic peripheral angiopathy without gangrene: Secondary | ICD-10-CM | POA: Diagnosis not present

## 2024-10-05 DIAGNOSIS — M79675 Pain in left toe(s): Secondary | ICD-10-CM

## 2024-10-05 DIAGNOSIS — M2142 Flat foot [pes planus] (acquired), left foot: Secondary | ICD-10-CM

## 2024-10-05 DIAGNOSIS — B351 Tinea unguium: Secondary | ICD-10-CM | POA: Diagnosis not present

## 2024-10-05 DIAGNOSIS — M2141 Flat foot [pes planus] (acquired), right foot: Secondary | ICD-10-CM | POA: Diagnosis not present

## 2024-10-05 NOTE — Progress Notes (Unsigned)
°  Subjective:  Patient ID: Shannon Baxter, female    DOB: [birth year],  MRN: [id number]  Chief Complaint  Patient presents with   [facility]    [facility] with callous A1c was 5.3 in Oct Plavix and Warrfrin    63 y.o. female presents with the above complaint. History confirmed with patient. Patient presenting with pain related to dystrophic thickened elongated nails. Patient is unable to trim own nails related to nail dystrophy and mobility issues. Patient does  have a history of T2DM.  Last A1c 5.3.  No significant painful calluses today.  She does ambulate with a walker.  She is on Plavix and warfarin.  Does report some recurrence of right foot pain along level of the arch.  Objective:  Physical Exam: warm to cool, good capillary refill, pedal hair growth absent, pedal skin atrophic nail exam onychomycosis of the toenails, onycholysis, and dystrophic nails DP pulses faintly palpable, PT pulses faintly palpable, and protective sensation intact Left Foot:  Pain with palpation of nails due to elongation and dystrophic growth.  Right Foot: Pain with palpation of nails due to elongation and dystrophic growth.   Bilateral pes planus foot type.  Tenderness on palpation of right PT tendon at level of insertion and plantar arch.  Ambulates with walker assistance.  Assessment:   1. Posterior tibial tendon dysfunction (PTTD) of right lower extremity   2. Pain due to onychomycosis of toenails of both feet   3. Type II diabetes mellitus with peripheral circulatory disorder (HCC)   4. Pes planus of both feet      Plan:  Patient was evaluated and treated and all questions answered.  #Onychomycosis with pain  -Nails palliatively debrided as below. -Educated on self-care - Anticoagulant on warfarin  Procedure: Nail Debridement Rationale: Pain Type of Debridement: manual, sharp debridement. Instrumentation: Nail nipper, rotary burr. Number of Nails: 10  Patient educated on diabetes. Discussed proper  diabetic foot care and discussed risks and complications of disease. Educated patient in depth on reasons to return to the office immediately should he/she discover anything concerning or new on the feet. All questions answered. Discussed proper shoes as well.   # Right foot PTTD Right PTTD pain did provide some stretching and strengthening exercises which I advised patient to perform to the best of her ability.  Discussed importance of good supportive shoes.  Can consider corticosteroid injection if there is no significant improvement. - NSAIDs contraindicated due to use of blood thinner, other comorbidities. -I certify that this diagnosis represents a distinct and separate diagnosis that requires evaluation and treatment separate from other procedures or diagnosis    Return in about 3 months (around 01/03/2025) for Diabetic Foot Care.         Ethan Saddler, DPM [facility]

## 2024-10-08 ENCOUNTER — Encounter: Payer: Self-pay | Admitting: Cardiology

## 2024-10-08 NOTE — Telephone Encounter (Signed)
 Called [facility] and they stated that they do not have any notes on the patient's chart from a person named Marcey from [facility].

## 2024-10-08 NOTE — Telephone Encounter (Signed)
 Caller Braden) stated he received a referral for [facility] check and wants a call back regarding their account form.

## 2024-10-08 NOTE — Progress Notes (Signed)
 Remote ICD Transmission

## 2024-10-13 ENCOUNTER — Telehealth: Payer: Self-pay

## 2024-10-13 ENCOUNTER — Ambulatory Visit: Attending: Cardiology

## 2024-10-13 DIAGNOSIS — I4892 Unspecified atrial flutter: Secondary | ICD-10-CM

## 2024-10-13 DIAGNOSIS — Z952 Presence of prosthetic heart valve: Secondary | ICD-10-CM | POA: Diagnosis not present

## 2024-10-13 DIAGNOSIS — I480 Paroxysmal atrial fibrillation: Secondary | ICD-10-CM

## 2024-10-13 DIAGNOSIS — Z7901 Long term (current) use of anticoagulants: Secondary | ICD-10-CM

## 2024-10-13 LAB — POCT INR: INR: 1.5 — AB (ref 2.0–3.0)

## 2024-10-13 NOTE — Patient Instructions (Signed)
 Take 2 tablets today only then Continue taking 1 tablet daily.  INR in 4 weeks.  [phone];

## 2024-10-13 NOTE — Telephone Encounter (Signed)
 I spoke to patient in regards to self testing and reminded her that we no longer allow new start home testers.  She said that she is aware of that and not sure who suggested that she self tests.  She will be coming in for appointment today 12/23.

## 2024-10-14 NOTE — Telephone Encounter (Signed)
 Error

## 2024-10-20 LAB — OPHTHALMOLOGY REPORT-SCANNED

## 2024-10-21 ENCOUNTER — Ambulatory Visit (HOSPITAL_BASED_OUTPATIENT_CLINIC_OR_DEPARTMENT_OTHER): Payer: Self-pay | Admitting: Student

## 2024-10-21 DIAGNOSIS — E113292 Type 2 diabetes mellitus with mild nonproliferative diabetic retinopathy without macular edema, left eye: Secondary | ICD-10-CM | POA: Insufficient documentation

## 2024-10-23 ENCOUNTER — Encounter (HOSPITAL_BASED_OUTPATIENT_CLINIC_OR_DEPARTMENT_OTHER): Payer: Self-pay | Admitting: Student

## 2024-10-23 ENCOUNTER — Ambulatory Visit (HOSPITAL_BASED_OUTPATIENT_CLINIC_OR_DEPARTMENT_OTHER): Admitting: Student

## 2024-10-23 VITALS — BP 106/52 | HR 81 | Temp 97.8°F | Resp 16 | Ht 72.0 in | Wt 316.9 lb

## 2024-10-23 DIAGNOSIS — I10 Essential (primary) hypertension: Secondary | ICD-10-CM

## 2024-10-23 DIAGNOSIS — F411 Generalized anxiety disorder: Secondary | ICD-10-CM

## 2024-10-23 DIAGNOSIS — E1151 Type 2 diabetes mellitus with diabetic peripheral angiopathy without gangrene: Secondary | ICD-10-CM

## 2024-10-23 DIAGNOSIS — J410 Simple chronic bronchitis: Secondary | ICD-10-CM | POA: Diagnosis not present

## 2024-10-23 DIAGNOSIS — K219 Gastro-esophageal reflux disease without esophagitis: Secondary | ICD-10-CM | POA: Diagnosis not present

## 2024-10-23 DIAGNOSIS — Z79899 Other long term (current) drug therapy: Secondary | ICD-10-CM

## 2024-10-23 DIAGNOSIS — E785 Hyperlipidemia, unspecified: Secondary | ICD-10-CM

## 2024-10-23 DIAGNOSIS — J9611 Chronic respiratory failure with hypoxia: Secondary | ICD-10-CM | POA: Diagnosis not present

## 2024-10-23 DIAGNOSIS — E039 Hypothyroidism, unspecified: Secondary | ICD-10-CM | POA: Diagnosis not present

## 2024-10-23 DIAGNOSIS — J302 Other seasonal allergic rhinitis: Secondary | ICD-10-CM | POA: Diagnosis not present

## 2024-10-23 DIAGNOSIS — J0100 Acute maxillary sinusitis, unspecified: Secondary | ICD-10-CM | POA: Diagnosis not present

## 2024-10-23 DIAGNOSIS — I480 Paroxysmal atrial fibrillation: Secondary | ICD-10-CM

## 2024-10-23 DIAGNOSIS — Z6841 Body Mass Index (BMI) 40.0 and over, adult: Secondary | ICD-10-CM

## 2024-10-23 DIAGNOSIS — K5909 Other constipation: Secondary | ICD-10-CM

## 2024-10-23 MED ORDER — DOXYCYCLINE HYCLATE 100 MG PO TABS: 100.0000 mg | ORAL_TABLET | Freq: Two times a day (BID) | ORAL | 0 refills | Status: AC

## 2024-10-23 MED ORDER — LEVOTHYROXINE SODIUM 100 MCG PO TABS: 100.0000 ug | ORAL_TABLET | Freq: Every day | ORAL | 3 refills | Status: AC

## 2024-10-23 MED ORDER — LEVOCETIRIZINE DIHYDROCHLORIDE 5 MG PO TABS: 5.0000 mg | ORAL_TABLET | Freq: Every evening | ORAL | 2 refills | Status: AC

## 2024-10-23 MED ORDER — LINACLOTIDE 72 MCG PO CAPS: 72.0000 ug | ORAL_CAPSULE | Freq: Every day | ORAL | 3 refills | Status: AC | PRN

## 2024-10-23 MED ORDER — TIRZEPATIDE 10 MG/0.5ML ~~LOC~~ SOAJ: 10.0000 mg | SUBCUTANEOUS | 3 refills | Status: AC

## 2024-10-23 MED ORDER — CLOPIDOGREL BISULFATE 75 MG PO TABS: 75.0000 mg | ORAL_TABLET | Freq: Every day | ORAL | 6 refills | Status: AC

## 2024-10-23 NOTE — Patient Instructions (Addendum)
 It was nice to see you today!  As we discussed in clinic:  - Stop Montelukast (Singulair) - Stop Loratidine (Claritin) - Instead we will start Xyzal for your allergies  - Try to only use 2 xanax  per day   If you have any problems before your next visit feel free to message me via MyChart (minor issues or questions) or call the office, otherwise you may reach out to schedule an office visit.  Thank you! Darrie Macmillan, PA-C

## 2024-10-23 NOTE — Progress Notes (Signed)
 "  Established Patient Office Visit  Subjective   Patient ID: Shannon Baxter, female    DOB: 08-Sep-1961  Age: 64 y.o. MRN: [id number]  Chief Complaint  Patient presents with   Medical Management of Chronic Issues    Follow up.   URI    Pt states she has been sick before Christmas. Feels like she has cold. Itchy throat and nose is running. Headaches are bad.    HPI  Discussed the use of AI scribe software for clinical note transcription with the patient, who gave verbal consent to proceed.  History of Present Illness   Shannon Baxter is a 64 year old female with diabetes who presents for medication management and follow-up.  She has lost three pounds since her last visit, which she attributes to the use of tirzepatide  (Mounjaro ) for diabetes and weight loss. She is satisfied with the weight loss and has stopped taking metformin, currently managing her diabetes with tirzepatide  alone.  She experiences chronic constipation and uses Linzess, which was recently increased from 7 mg to 10 mg. She has difficulty with bowel movements and uses a cream for hemorrhoids, which she is currently out of.  She has a history of atrial fibrillation and heart failure, with no new symptoms reported. She takes levothyroxine for thyroid  management, with no changes in hair, skin, or nails, and requires a refill. She also takes clopidogrel (Plavix) for heart and leg issues and is due for a refill.  She experiences allergies and was previously on montelukast and loratadine (Claritin). She reports 'bad allergies'.  She uses oxygen  at night due to chronic hypoxemic respiratory failure and has a CPAP machine for sleep apnea. She reports no issues with oxygen  use at night.  She currently takes Xanax  as needed.  She reports recent illness with symptoms of fever, cough, and nasal pain, suspecting a sinus infection. She describes 'razor blade' like pain in her nose and has been using Flonase. She had a fever four days ago  and ongoing cough, but no shortness of breath or chest pain.      Patient Active Problem List   Diagnosis Date Noted   Polypharmacy 10/23/2024   Mild nonproliferative diabetic retinopathy of left eye without macular edema associated with type 2 diabetes mellitus (HCC) 10/21/2024   GAD (generalized anxiety disorder) 09/03/2024   History of stroke 09/03/2024   Simple chronic bronchitis (HCC) 07/22/2024   Benzodiazepine dependence (HCC) 07/22/2024   Atrial fibrillation (HCC) 07/14/2024   Acute venous embolism and thrombosis of deep vessels of proximal lower extremity (HCC) 07/14/2024   Warfarin anticoagulation 07/14/2024   Restless leg 06/24/2024   Chronic hypoxemic respiratory failure (HCC) 06/24/2024   CHF (congestive heart failure) (HCC)    OSA on CPAP    PVD (peripheral vascular disease)    Nutritional and metabolic cardiomyopathy (HCC)    Hypertension    GERD (gastroesophageal reflux disease)    Congestive heart failure (CHF) (HCC)    Asthma    Arthritis    Aortic stenosis    Aortic insufficiency    Coronary artery disease    Paroxysmal atrial flutter (HCC) 09/01/2018   Chronic systolic congestive heart failure (HCC) 12/06/2016   Morbid obesity (HCC) 12/06/2016   Presence of automatic implantable cardioverter-defibrillator 10/23/2016   Dilated cardiomyopathy (HCC) 09/03/2016   History of mechanical aortic valve replacement 03/29/2016   Paroxysmal atrial fibrillation (HCC) 03/06/2016   Hypothyroidism 01/23/2016   Type 2 diabetes mellitus with diabetic peripheral angiopathy without gangrene, without long-term current  use of insulin (HCC) 11/22/2015   Atherosclerosis of native arteries of extremities with intermittent claudication, bilateral legs 08/04/2015   Carotid atherosclerosis 07/26/2015   Coronary artery disease involving native coronary artery of native heart without angina pectoris 07/26/2015   Essential hypertension 07/26/2015   Peripheral vascular disease 07/26/2015    History of tobacco abuse 07/26/2015   Hyperlipidemia 07/26/2015   Past Medical History:  Diagnosis Date   Aortic stenosis    Arthritis    Asthma    Atherosclerosis of native arteries of extremities with intermittent claudication, bilateral legs 08/04/2015   Carotid atherosclerosis 07/26/2015   Chronic systolic congestive heart failure (HCC) 12/06/2016   Coronary artery disease involving native coronary artery of native heart without angina pectoris 07/26/2015   Diabetes mellitus without complication (HCC)    Dilated cardiomyopathy (HCC) 09/03/2016   Overview:  Ejection fraction 20-25%   Dyslipidemia 07/26/2015   Essential hypertension 07/26/2015   GERD (gastroesophageal reflux disease)    History of mechanical aortic valve replacement 03/29/2016   Overview:  May 2017. [facility] 23 mm  Formatting of this note might be different from the original. Overview:  May 2017. [facility] 23 mm Formatting of this note might be different from the original. May 2017. [facility] 23 mm   History of tobacco abuse 07/26/2015   Hyperlipidemia    Hypertension    Hypothyroidism 01/23/2016   Morbid obesity (HCC) 12/06/2016   Paroxysmal atrial fibrillation (HCC) 03/06/2016   Paroxysmal atrial flutter (HCC) 09/01/2018   Peripheral vascular disease 07/26/2015   Presence of automatic implantable cardioverter-defibrillator 10/23/2016   PVD (peripheral vascular disease)    Rheumatic aortic valve insufficiency 03/05/2016   Sleep apnea    Stroke [facility])    Type 2 diabetes mellitus with diabetic peripheral angiopathy without gangrene, without long-term current use of insulin (HCC) 11/22/2015   Social History[1] Allergies[2]    ROS Per HPI.    Objective:     BP (!) 106/52   Pulse 81   Temp 97.8 F (36.6 C) (Oral)   Resp 16   Ht 6' (1.829 m)   Wt (!) 316 lb 14.4 oz (143.7 kg)   SpO2 96%   BMI 42.98 kg/m  BP Readings from Last 3 Encounters:  10/23/24 (!) 106/52  09/30/24 138/77  09/03/24 (!)  116/53   Wt Readings from Last 3 Encounters:  10/23/24 (!) 316 lb 14.4 oz (143.7 kg)  09/30/24 (!) 319 lb 12.8 oz (145.1 kg)  09/03/24 (!) 323 lb (146.5 kg)   SpO2 Readings from Last 3 Encounters:  10/23/24 96%  09/30/24 97%  09/03/24 96%      Physical Exam Constitutional:      General: She is not in acute distress.    Appearance: Normal appearance. She is not ill-appearing.  HENT:     Head: Normocephalic and atraumatic.     Right Ear: Tympanic membrane, ear canal and external ear normal.     Left Ear: Ear canal and external ear normal.     Ears:     Comments: Left TM bulging with effusion-no pustular contents noted.    Nose: Nose normal.     Comments: Maxillary sinus TTP. Eyes:     General: No scleral icterus.    Conjunctiva/sclera: Conjunctivae normal.  Cardiovascular:     Rate and Rhythm: Normal rate and regular rhythm.     Heart sounds: Normal heart sounds. No murmur heard.    No friction rub.  Pulmonary:     Effort:  Pulmonary effort is normal. No respiratory distress.     Breath sounds: Normal breath sounds. No wheezing, rhonchi or rales.  Musculoskeletal:        General: Normal range of motion.     Right lower leg: Edema (1+ pitting) present.     Left lower leg: Edema (1+ pitting) present.  Skin:    General: Skin is warm and dry.     Coloration: Skin is not jaundiced or pale.  Neurological:     General: No focal deficit present.     Mental Status: She is alert.  Psychiatric:        Mood and Affect: Mood normal.        Behavior: Behavior normal.      No results found for any visits on 10/23/24.  Last CBC Lab Results  Component Value Date   WBC 6.0 07/22/2024   HGB 12.7 07/22/2024   HCT 40.9 07/22/2024   MCV 89 07/22/2024   MCH 27.6 07/22/2024   RDW 14.8 07/22/2024   PLT 215 07/22/2024   Last metabolic panel Lab Results  Component Value Date   GLUCOSE 91 07/22/2024   NA 141 07/22/2024   K 4.5 07/22/2024   CL 104 07/22/2024   CO2 21  07/22/2024   BUN 20 07/22/2024   CREATININE 0.99 07/22/2024   EGFR 64 07/22/2024   CALCIUM  9.4 07/22/2024   PROT 7.0 07/22/2024   ALBUMIN 4.0 07/22/2024   LABGLOB 3.0 07/22/2024   AGRATIO 1.1 (L) 12/27/2022   BILITOT 0.4 07/22/2024   ALKPHOS 62 07/22/2024   AST 21 07/22/2024   ALT 14 07/22/2024   Last lipids Lab Results  Component Value Date   CHOL 156 07/22/2024   HDL 46 07/22/2024   LDLCALC 93 07/22/2024   TRIG 89 07/22/2024   CHOLHDL 3.4 07/22/2024   Last hemoglobin A1c Lab Results  Component Value Date   HGBA1C 5.7 (H) 07/22/2024   Last thyroid  functions Lab Results  Component Value Date   TSH 3.600 03/21/2022      The ASCVD Risk score (Arnett DK, et al., 2019) failed to calculate for the following reasons:   Risk score cannot be calculated because patient has a medical history suggesting prior/existing ASCVD   * - Cholesterol units were assumed    Assessment & Plan:  Assessment and Plan    Acute maxillary sinusitis Symptoms include nasal pain, fever, and facial pressure, suggestive of a sinus infection. No shortness of breath or chest pain. Lungs are clear on examination. - Prescribed doxycycline for 7 days - Advised to take doxycycline with food to prevent nausea - Instructed to monitor for symptoms such as abdominal pain, itching, unexplained weight loss, or jaundice and seek medical attention if she occurs  Type 2 diabetes mellitus with diabetic peripheral angiopathy/morbid obesity-BMI 42 Chronic, not at goal we are still working on establishing a decent bit more weight loss but she is doing well so far.  No side effects of therapy noted.  Last A1c was 5.7 which did look good.  Diabetes management with tirzepatide  (Mounjaro ) is effective, with recent weight loss of 3 pounds. Metformin has been discontinued. Current BMI is 42, with potential for further reduction.  She does note improved mobility currently. - Increased tirzepatide  dose to 10 mg -  Discontinued metformin - Scheduled follow-up in 3 months to assess diabetes control and weight loss - Reassess A1c today.  Chronic constipation -Refill Linzess  Paroxysmal A-fib - Refill Plavix - Continue to follow with  warfarin clinic  Acquired hypothyroidism Chronic, stable.  Well-managed on current dose of levothyroxine. No changes in hair, skin, or nails reported. - Refilled levothyroxine prescription for one year  Gastroesophageal reflux disease Chronic, no issues noted. - Continue current regimen of omeprazole 20 mg.  Generalized anxiety disorder Currently using Xanax . Plan to reduce usage due to risks of respiratory depression, memory issues, and increased fall risk. - Reduce Xanax  to 2 per day - Monitor for respiratory depression and memory issues  COPD/chronic hypoxemic respiratory failure Chronic, stable.  Managed with nighttime oxygen  and CPAP. No issues reported with current setup. - Continue current oxygen  and CPAP therapy - Continue current regimen  Seasonal allergic rhinitis Chronic, overall stable but could use some improvement.  Currently using montelukast and loratadine. Plan to simplify medication regimen by switching to Xyzal. - Discontinued montelukast and loratadine - Started Xyzal for allergy management - Advised to take Xyzal at bedtime     Polypharmacy - We will work on reducing overall medications as she currently is on a very large list.  Return if symptoms worsen or fail to improve.    [place] Frieden T Bryer Cozzolino, PA-C     [1]  Social History Tobacco Use   Smoking status: Former    Current packs/day: 0.00    Average packs/day: 0.3 packs/day for 20.0 years (5.0 ttl pk-yrs)    Types: Cigarettes    Start date: 34    Quit date: 2014    Years since quitting: 12.0    Passive exposure: Past   Smokeless tobacco: Never  Vaping Use   Vaping status: Never Used  Substance Use Topics   Alcohol use: Not Currently    Comment: quit 2010   Drug use: Not  Currently    Types: Crack cocaine  [2]  Allergies Allergen Reactions   Iodinated Contrast Media Hives and Rash   Ioxaglate Hives   Latex Hives   "

## 2024-10-24 LAB — COMPREHENSIVE METABOLIC PANEL WITH GFR
ALT: 12 IU/L (ref 0–32)
AST: 18 IU/L (ref 0–40)
Albumin: 3.7 g/dL — ABNORMAL LOW (ref 3.9–4.9)
Alkaline Phosphatase: 59 IU/L (ref 49–135)
BUN/Creatinine Ratio: 14 (ref 12–28)
BUN: 17 mg/dL (ref 8–27)
Bilirubin Total: 0.3 mg/dL (ref 0.0–1.2)
CO2: 24 mmol/L (ref 20–29)
Calcium: 8.9 mg/dL (ref 8.7–10.3)
Chloride: 102 mmol/L (ref 96–106)
Creatinine, Ser: 1.2 mg/dL — ABNORMAL HIGH (ref 0.57–1.00)
Globulin, Total: 3.1 g/dL (ref 1.5–4.5)
Glucose: 108 mg/dL — ABNORMAL HIGH (ref 70–99)
Potassium: 4.6 mmol/L (ref 3.5–5.2)
Sodium: 138 mmol/L (ref 134–144)
Total Protein: 6.8 g/dL (ref 6.0–8.5)
eGFR: 51 mL/min/1.73 — ABNORMAL LOW

## 2024-10-24 LAB — LIPID PANEL
Chol/HDL Ratio: 3.7 ratio (ref 0.0–4.4)
Cholesterol, Total: 150 mg/dL (ref 100–199)
HDL: 41 mg/dL
LDL Chol Calc (NIH): 86 mg/dL (ref 0–99)
Triglycerides: 130 mg/dL (ref 0–149)
VLDL Cholesterol Cal: 23 mg/dL (ref 5–40)

## 2024-10-24 LAB — CBC WITH DIFFERENTIAL/PLATELET
Basophils Absolute: 0.1 x10E3/uL (ref 0.0–0.2)
Basos: 1 %
EOS (ABSOLUTE): 0.2 x10E3/uL (ref 0.0–0.4)
Eos: 3 %
Hematocrit: 41.2 % (ref 34.0–46.6)
Hemoglobin: 12.9 g/dL (ref 11.1–15.9)
Immature Grans (Abs): 0 x10E3/uL (ref 0.0–0.1)
Immature Granulocytes: 0 %
Lymphocytes Absolute: 1.8 x10E3/uL (ref 0.7–3.1)
Lymphs: 31 %
MCH: 27.4 pg (ref 26.6–33.0)
MCHC: 31.3 g/dL — ABNORMAL LOW (ref 31.5–35.7)
MCV: 88 fL (ref 79–97)
Monocytes Absolute: 0.5 x10E3/uL (ref 0.1–0.9)
Monocytes: 9 %
Neutrophils Absolute: 3.2 x10E3/uL (ref 1.4–7.0)
Neutrophils: 56 %
Platelets: 211 x10E3/uL (ref 150–450)
RBC: 4.71 x10E6/uL (ref 3.77–5.28)
RDW: 13.5 % (ref 11.7–15.4)
WBC: 5.8 x10E3/uL (ref 3.4–10.8)

## 2024-10-24 LAB — HEMOGLOBIN A1C
Est. average glucose Bld gHb Est-mCnc: 131 mg/dL
Hgb A1c MFr Bld: 6.2 % — ABNORMAL HIGH (ref 4.8–5.6)

## 2024-10-26 ENCOUNTER — Other Ambulatory Visit (HOSPITAL_BASED_OUTPATIENT_CLINIC_OR_DEPARTMENT_OTHER): Payer: Self-pay | Admitting: Student

## 2024-10-26 DIAGNOSIS — E1151 Type 2 diabetes mellitus with diabetic peripheral angiopathy without gangrene: Secondary | ICD-10-CM

## 2024-10-26 DIAGNOSIS — F411 Generalized anxiety disorder: Secondary | ICD-10-CM

## 2024-10-26 DIAGNOSIS — Z6841 Body Mass Index (BMI) 40.0 and over, adult: Secondary | ICD-10-CM

## 2024-10-26 NOTE — Telephone Encounter (Signed)
 Copied from CRM [id number]. Topic: Clinical - Medication Refill >> Oct 26, 2024  3:37 PM Thersia C wrote: Medication: ALPRAZolam  (XANAX ) 0.5 MG tablet tirzepatide  (MOUNJARO ) 10 MG/0.5ML Pen Has the patient contacted their pharmacy? Yes (Agent: If no, request that the patient contact the pharmacy for the refill. If patient does not wish to contact the pharmacy document the reason why and proceed with request.) (Agent: If yes, when and what did the pharmacy advise?)  This is the patient's preferred pharmacy:  [facility] [address] [place] - [address] [place] [address] Phone: [phone] Fax: [phone]  Is this the correct pharmacy for this prescription? Yes If no, delete pharmacy and type the correct one.   Has the prescription been filled recently? No  Is the patient out of the medication? Yes  Has the patient been seen for an appointment in the last year OR does the patient have an upcoming appointment? Yes  Can we respond through MyChart? Yes  Agent: Please be advised that Rx refills may take up to 3 business days. We ask that you follow-up with your pharmacy.

## 2024-10-27 MED ORDER — ALPRAZOLAM 0.5 MG PO TABS: 0.5000 mg | ORAL_TABLET | Freq: Two times a day (BID) | ORAL | 0 refills | Status: DC | PRN

## 2024-10-28 ENCOUNTER — Ambulatory Visit (HOSPITAL_BASED_OUTPATIENT_CLINIC_OR_DEPARTMENT_OTHER): Payer: Self-pay | Admitting: Student

## 2024-11-04 ENCOUNTER — Ambulatory Visit: Payer: Self-pay

## 2024-11-04 NOTE — Telephone Encounter (Signed)
 FYI Only or Action Required?: FYI only for provider: appointment scheduled on 11/05/24.  Patient was last seen in primary care on 10/23/2024 by Rothfuss, Lang DASEN, PA-C.  Called Nurse Triage reporting Fall.  Symptoms began several days ago.  Interventions attempted: Nothing.  Symptoms are: stable.  Triage Disposition: See PCP When Office is Open (Within 3 Days)  Patient/caregiver understands and will follow disposition?: Yes Reason for Disposition  MILD weakness (e.g., does not interfere with ability to work, go to school, normal activities)  (Exception: Mild weakness is a chronic symptom.)  Answer Assessment - Initial Assessment Questions 1. MECHANISM: How did the fall happen?     Got up too fast and fell forward  2. ONSET: When did the fall happen? (e.g., minutes, hours, or days ago)     2 days ago  3. LOCATION: What part of the body hit the ground? (e.g., back, buttocks, head, hips, knees, hands, head, stomach)     Fell face forward, shoulders hit the ground  4. PAIN: Is there any pain? If Yes, ask: How bad is the pain? (e.g., Scale 0-10; or none, mild,      Right leg, buttocks, toes, overall body pain. 7-8/10  5. OTHER SYMPTOMS: Do you have any other symptoms? (e.g., dizziness, fever, weakness; new-onset or worsening).      Denies  Protocols used: Falls and [facility]  Copied from CRM [id number]. Topic: Clinical - Red Word Triage >> Nov 04, 2024  3:24 PM Shannon Baxter wrote: Red Word that prompted transfer to Nurse Triage: Patient fell on Sunday and she is still experiencing pain/soreness in her right leg. Sometimes her leg and toes get stiff.

## 2024-11-05 ENCOUNTER — Ambulatory Visit (HOSPITAL_BASED_OUTPATIENT_CLINIC_OR_DEPARTMENT_OTHER): Admitting: Student

## 2024-11-05 ENCOUNTER — Ambulatory Visit: Payer: Self-pay | Admitting: *Deleted

## 2024-11-05 NOTE — Telephone Encounter (Signed)
 Copied from CRM #[id number]. Topic: Appointments - Appointment Cancel/Reschedule >> Nov 05, 2024  8:28 AM Willma SAUNDERS wrote: Patient/patient representative is calling to cancel an appointment. Warm transferred to NT  Patient is calling to cancel her appointmen this afternoon. Patient state sshe does not feel like getting out of the house today- it is too cold. Attempted to encourage patient to keep appointment-or reschedule but she disconnected call.  Reason for Disposition  [1] Follow-up call to recent contact AND [2] information only call, no triage required  Answer Assessment - Initial Assessment Questions 1. REASON FOR CALL: What is the main reason for your call? or How can I best help you?     Patient wished to cancel her appointment 2. SYMPTOMS : Do you have any symptoms?      Fall, body aches Patient states it is too cold today and she is staying in the bed- does not want to get out in the cold today- doesn't feel good.  Protocols used: Information Only Call - No Triage-A-AH

## 2024-11-10 ENCOUNTER — Ambulatory Visit: Attending: Cardiology

## 2024-11-10 DIAGNOSIS — I4892 Unspecified atrial flutter: Secondary | ICD-10-CM | POA: Diagnosis not present

## 2024-11-10 DIAGNOSIS — Z952 Presence of prosthetic heart valve: Secondary | ICD-10-CM | POA: Diagnosis not present

## 2024-11-10 DIAGNOSIS — Z5181 Encounter for therapeutic drug level monitoring: Secondary | ICD-10-CM

## 2024-11-10 DIAGNOSIS — I480 Paroxysmal atrial fibrillation: Secondary | ICD-10-CM | POA: Diagnosis not present

## 2024-11-10 DIAGNOSIS — Z7901 Long term (current) use of anticoagulants: Secondary | ICD-10-CM | POA: Diagnosis not present

## 2024-11-10 LAB — POCT INR: INR: 2.9 (ref 2.0–3.0)

## 2024-11-10 NOTE — Patient Instructions (Signed)
 Continue taking 1 tablet daily.  INR in 6 weeks.  [phone];

## 2024-11-11 ENCOUNTER — Telehealth (HOSPITAL_BASED_OUTPATIENT_CLINIC_OR_DEPARTMENT_OTHER): Payer: Self-pay

## 2024-11-11 ENCOUNTER — Other Ambulatory Visit (HOSPITAL_BASED_OUTPATIENT_CLINIC_OR_DEPARTMENT_OTHER): Payer: Self-pay

## 2024-11-11 ENCOUNTER — Telehealth (HOSPITAL_BASED_OUTPATIENT_CLINIC_OR_DEPARTMENT_OTHER): Payer: Self-pay | Admitting: Student

## 2024-11-11 ENCOUNTER — Other Ambulatory Visit (HOSPITAL_BASED_OUTPATIENT_CLINIC_OR_DEPARTMENT_OTHER): Payer: Self-pay | Admitting: Student

## 2024-11-11 DIAGNOSIS — E1151 Type 2 diabetes mellitus with diabetic peripheral angiopathy without gangrene: Secondary | ICD-10-CM

## 2024-11-11 DIAGNOSIS — I480 Paroxysmal atrial fibrillation: Secondary | ICD-10-CM

## 2024-11-11 MED ORDER — ACCU-CHEK GUIDE TEST VI STRP: 1.0000 | ORAL_STRIP | Freq: Every day | 3 refills | Status: AC

## 2024-11-11 MED ORDER — ACCU-CHEK SOFTCLIX LANCETS MISC: 1.0000 | Freq: Every day | 3 refills | Status: AC

## 2024-11-11 MED ORDER — FUROSEMIDE 40 MG PO TABS: 40.0000 mg | ORAL_TABLET | ORAL | 3 refills | Status: AC | PRN

## 2024-11-11 NOTE — Telephone Encounter (Signed)
 Copied from CRM [id number]. Topic: Clinical - Medication Refill >> Nov 11, 2024 10:55 AM Wess RAMAN wrote: Medication: clopidogrel  (PLAVIX ) 75 MG tablet   Has the patient contacted their pharmacy? No (Agent: If no, request that the patient contact the pharmacy for the refill. If patient does not wish to contact the pharmacy document the reason why and proceed with request.) (Agent: If yes, when and what did the pharmacy advise?)  This is the patient's preferred pharmacy:  [facility] [address] [place] - [address] [place] [address] Phone: [phone] Fax: [phone]  Is this the correct pharmacy for this prescription? Yes If no, delete pharmacy and type the correct one.   Has the prescription been filled recently? Yes  Is the patient out of the medication? Yes  Has the patient been seen for an appointment in the last year OR does the patient have an upcoming appointment? Yes  Can we respond through MyChart? No  Agent: Please be advised that Rx refills may take up to 3 business days. We ask that you follow-up with your pharmacy.

## 2024-11-11 NOTE — Telephone Encounter (Signed)
 Copied from CRM #[id number]. Topic: Clinical - Medication Refill >> Nov 11, 2024  3:09 PM Kevelyn M wrote: Medication: Accu-Chek Guide, the ones she has are old  Has the patient contacted their pharmacy? No (Agent: If no, request that the patient contact the pharmacy for the refill. If patient does not wish to contact the pharmacy document the reason why and proceed with request.) (Agent: If yes, when and what did the pharmacy advise?)  This is the patient's preferred pharmacy:  [facility] [address] [place] - [address] [place] [address] Phone: [phone] Fax: [phone]  Is this the correct pharmacy for this prescription? Yes If no, delete pharmacy and type the correct one.   Has the prescription been filled recently? No  Is the patient out of the medication? Yes  Has the patient been seen for an appointment in the last year OR does the patient have an upcoming appointment? Yes  Can we respond through MyChart? No  Agent: Please be advised that Rx refills may take up to 3 business days. We ask that you follow-up with your pharmacy.    Called pt and verify which lancets & strips to send. Rx sent.

## 2024-11-11 NOTE — Telephone Encounter (Signed)
 Copied from CRM #[id number]. Topic: Referral - Request for Referral >> Nov 11, 2024  3:08 PM Kevelyn M wrote: Did the patient discuss referral with their provider in the last year? Yes (If No - schedule appointment) (If Yes - send message)  Appointment offered? No  Type of order/referral and detailed reason for visit: Knee pain in both knees  Preference of office, provider, location: [facility]  If referral order, have you been seen by this specialty before? Yes (If Yes, this issue or another issue? When? Where?  Can we respond through MyChart? No

## 2024-11-11 NOTE — Telephone Encounter (Signed)
 Copied from CRM #[id number]. Topic: Clinical - Medication Refill >> Nov 11, 2024 10:37 AM Tanazia G wrote: Medication: furosemide  (LASIX ) 40 MG tablet  Has the patient contacted their pharmacy? Yes (Agent: If no, request that the patient contact the pharmacy for the refill. If patient does not wish to contact the pharmacy document the reason why and proceed with request.) (Agent: If yes, when and what did the pharmacy advise?)  This is the patient's preferred pharmacy:  [facility] [address] [place] - [address] [place] [address] Phone: [phone] Fax: [phone]  Is this the correct pharmacy for this prescription? Yes If no, delete pharmacy and type the correct one.   Has the prescription been filled recently? Yes  Is the patient out of the medication? Yes  Has the patient been seen for an appointment in the last year OR does the patient have an upcoming appointment? Yes  Can we respond through MyChart? Yes  Agent: Please be advised that Rx refills may take up to 3 business days. We ask that you follow-up with your pharmacy.

## 2024-11-19 ENCOUNTER — Other Ambulatory Visit: Payer: Self-pay | Admitting: Cardiology

## 2024-11-24 ENCOUNTER — Telehealth: Payer: Self-pay | Admitting: Cardiology

## 2024-11-24 ENCOUNTER — Ambulatory Visit: Payer: Self-pay

## 2024-11-24 NOTE — Telephone Encounter (Signed)
Calling with abnormal results. Please advise  

## 2024-11-24 NOTE — Telephone Encounter (Signed)
 Called [facility] and they reported that the patient's INR was 1.3.

## 2024-11-26 ENCOUNTER — Other Ambulatory Visit (HOSPITAL_BASED_OUTPATIENT_CLINIC_OR_DEPARTMENT_OTHER): Payer: Self-pay | Admitting: Student

## 2024-11-26 ENCOUNTER — Telehealth: Payer: Self-pay

## 2024-11-26 DIAGNOSIS — F411 Generalized anxiety disorder: Secondary | ICD-10-CM

## 2024-11-26 NOTE — Telephone Encounter (Signed)
 Unable to reach pt regarding message:      Good afternoon Shannon Baxter. Which [facility] were you asking for a referral to?

## 2024-12-22 ENCOUNTER — Ambulatory Visit

## 2024-12-30 ENCOUNTER — Ambulatory Visit (HOSPITAL_BASED_OUTPATIENT_CLINIC_OR_DEPARTMENT_OTHER): Admitting: Student

## 2024-12-31 ENCOUNTER — Ambulatory Visit: Payer: Self-pay

## 2025-01-05 ENCOUNTER — Ambulatory Visit: Admitting: Podiatry

## 2025-02-23 ENCOUNTER — Ambulatory Visit: Payer: Self-pay

## 2025-04-01 ENCOUNTER — Ambulatory Visit: Payer: Self-pay

## 2025-05-25 ENCOUNTER — Ambulatory Visit: Payer: Self-pay

## 2025-06-15 ENCOUNTER — Ambulatory Visit: Admitting: Family Medicine

## 2025-07-01 ENCOUNTER — Ambulatory Visit: Payer: Self-pay

## 2025-08-24 ENCOUNTER — Ambulatory Visit: Payer: Self-pay

## 2025-09-30 ENCOUNTER — Ambulatory Visit: Payer: Self-pay

## 2025-11-23 ENCOUNTER — Ambulatory Visit: Payer: Self-pay

## 2025-12-30 ENCOUNTER — Ambulatory Visit: Payer: Self-pay
